# Patient Record
Sex: Female | Born: 1935
Health system: Southern US, Community
[De-identification: ages and names within clinical notes are randomized; demographics above are authoritative.]

## PROBLEM LIST (undated history)

## (undated) DIAGNOSIS — I10 Essential (primary) hypertension: Secondary | ICD-10-CM

## (undated) DIAGNOSIS — S5291XA Unspecified fracture of right forearm, initial encounter for closed fracture: Secondary | ICD-10-CM

## (undated) DIAGNOSIS — G309 Alzheimer's disease, unspecified: Secondary | ICD-10-CM

## (undated) DIAGNOSIS — F419 Anxiety disorder, unspecified: Secondary | ICD-10-CM

## (undated) DIAGNOSIS — E785 Hyperlipidemia, unspecified: Secondary | ICD-10-CM

## (undated) DIAGNOSIS — K219 Gastro-esophageal reflux disease without esophagitis: Secondary | ICD-10-CM

## (undated) DIAGNOSIS — F0280 Dementia in other diseases classified elsewhere without behavioral disturbance: Secondary | ICD-10-CM

## (undated) DIAGNOSIS — M8580 Other specified disorders of bone density and structure, unspecified site: Secondary | ICD-10-CM

## (undated) DIAGNOSIS — E079 Disorder of thyroid, unspecified: Secondary | ICD-10-CM

## (undated) DIAGNOSIS — Z86010 Personal history of colonic polyps: Secondary | ICD-10-CM

## (undated) DIAGNOSIS — E039 Hypothyroidism, unspecified: Secondary | ICD-10-CM

## (undated) DIAGNOSIS — S060X0A Concussion without loss of consciousness, initial encounter: Secondary | ICD-10-CM

## (undated) DIAGNOSIS — F0390 Unspecified dementia without behavioral disturbance: Secondary | ICD-10-CM

## (undated) DIAGNOSIS — Z9289 Personal history of other medical treatment: Secondary | ICD-10-CM

## (undated) HISTORY — DX: Other specified disorders of bone density and structure, unspecified site: M85.80

## (undated) HISTORY — PX: ROTATOR CUFF REPAIR: SHX139

## (undated) HISTORY — PX: BILATERAL SALPINGOOPHORECTOMY: SHX1223

## (undated) HISTORY — PX: TONSILLECTOMY: SUR1361

## (undated) HISTORY — DX: Hyperlipidemia, unspecified: E78.5

## (undated) HISTORY — DX: Personal history of other medical treatment: Z92.89

## (undated) HISTORY — PX: CATARACT EXTRACTION: SUR2

## (undated) HISTORY — DX: Concussion without loss of consciousness, initial encounter: S06.0X0A

## (undated) HISTORY — DX: Gastro-esophageal reflux disease without esophagitis: K21.9

## (undated) HISTORY — PX: OTHER SURGICAL HISTORY: SHX169

## (undated) HISTORY — DX: Personal history of colonic polyps: Z86.010

## (undated) HISTORY — DX: Unspecified fracture of right forearm, initial encounter for closed fracture: S52.91XA

## (undated) HISTORY — DX: Essential (primary) hypertension: I10

---

## 1998-02-23 ENCOUNTER — Other Ambulatory Visit: Admission: RE | Admit: 1998-02-23 | Discharge: 1998-02-23 | Payer: Self-pay | Admitting: Obstetrics and Gynecology

## 1999-02-07 ENCOUNTER — Emergency Department (HOSPITAL_COMMUNITY): Admission: EM | Admit: 1999-02-07 | Discharge: 1999-02-07 | Payer: Self-pay | Admitting: Emergency Medicine

## 1999-08-09 ENCOUNTER — Encounter: Payer: Self-pay | Admitting: Internal Medicine

## 1999-08-09 ENCOUNTER — Ambulatory Visit (HOSPITAL_COMMUNITY): Admission: RE | Admit: 1999-08-09 | Discharge: 1999-08-09 | Payer: Self-pay | Admitting: Internal Medicine

## 1999-10-04 ENCOUNTER — Encounter: Payer: Self-pay | Admitting: Internal Medicine

## 1999-10-04 ENCOUNTER — Encounter: Admission: RE | Admit: 1999-10-04 | Discharge: 1999-10-04 | Payer: Self-pay | Admitting: Internal Medicine

## 1999-11-15 ENCOUNTER — Other Ambulatory Visit: Admission: RE | Admit: 1999-11-15 | Discharge: 1999-11-15 | Payer: Self-pay | Admitting: *Deleted

## 1999-11-17 ENCOUNTER — Encounter: Payer: Self-pay | Admitting: *Deleted

## 1999-11-17 ENCOUNTER — Encounter: Admission: RE | Admit: 1999-11-17 | Discharge: 1999-11-17 | Payer: Self-pay | Admitting: *Deleted

## 2000-11-15 ENCOUNTER — Encounter: Payer: Self-pay | Admitting: Internal Medicine

## 2000-11-15 ENCOUNTER — Encounter: Admission: RE | Admit: 2000-11-15 | Discharge: 2000-11-15 | Payer: Self-pay | Admitting: Internal Medicine

## 2001-03-20 ENCOUNTER — Emergency Department (HOSPITAL_COMMUNITY): Admission: EM | Admit: 2001-03-20 | Discharge: 2001-03-20 | Payer: Self-pay | Admitting: Emergency Medicine

## 2001-03-20 ENCOUNTER — Encounter: Payer: Self-pay | Admitting: Emergency Medicine

## 2001-03-21 ENCOUNTER — Encounter: Payer: Self-pay | Admitting: Emergency Medicine

## 2001-03-28 ENCOUNTER — Emergency Department (HOSPITAL_COMMUNITY): Admission: EM | Admit: 2001-03-28 | Discharge: 2001-03-28 | Payer: Self-pay | Admitting: *Deleted

## 2001-05-11 ENCOUNTER — Encounter: Admission: RE | Admit: 2001-05-11 | Discharge: 2001-05-11 | Payer: Self-pay | Admitting: Orthopedic Surgery

## 2001-05-11 ENCOUNTER — Encounter: Payer: Self-pay | Admitting: Orthopedic Surgery

## 2001-10-25 ENCOUNTER — Ambulatory Visit (HOSPITAL_COMMUNITY): Admission: RE | Admit: 2001-10-25 | Discharge: 2001-10-26 | Payer: Self-pay | Admitting: *Deleted

## 2001-10-25 ENCOUNTER — Encounter: Payer: Self-pay | Admitting: *Deleted

## 2002-07-30 ENCOUNTER — Encounter: Admission: RE | Admit: 2002-07-30 | Discharge: 2002-07-30 | Payer: Self-pay | Admitting: Anesthesiology

## 2002-07-30 ENCOUNTER — Encounter: Payer: Self-pay | Admitting: Anesthesiology

## 2003-05-13 ENCOUNTER — Other Ambulatory Visit: Admission: RE | Admit: 2003-05-13 | Discharge: 2003-05-13 | Payer: Self-pay | Admitting: Internal Medicine

## 2003-11-07 ENCOUNTER — Ambulatory Visit (HOSPITAL_COMMUNITY): Admission: RE | Admit: 2003-11-07 | Discharge: 2003-11-07 | Payer: Self-pay | Admitting: Cardiology

## 2004-06-15 ENCOUNTER — Ambulatory Visit: Payer: Self-pay | Admitting: Internal Medicine

## 2004-07-01 ENCOUNTER — Encounter: Admission: RE | Admit: 2004-07-01 | Discharge: 2004-07-01 | Payer: Self-pay | Admitting: Internal Medicine

## 2004-08-06 ENCOUNTER — Ambulatory Visit: Payer: Self-pay | Admitting: Internal Medicine

## 2004-08-13 ENCOUNTER — Ambulatory Visit: Payer: Self-pay | Admitting: Internal Medicine

## 2004-08-20 ENCOUNTER — Ambulatory Visit: Payer: Self-pay | Admitting: Gastroenterology

## 2004-09-01 ENCOUNTER — Ambulatory Visit: Payer: Self-pay | Admitting: Gastroenterology

## 2004-11-11 ENCOUNTER — Ambulatory Visit: Payer: Self-pay | Admitting: Internal Medicine

## 2004-12-20 ENCOUNTER — Ambulatory Visit: Payer: Self-pay | Admitting: Gastroenterology

## 2004-12-24 ENCOUNTER — Ambulatory Visit: Payer: Self-pay | Admitting: Gastroenterology

## 2004-12-28 ENCOUNTER — Ambulatory Visit: Payer: Self-pay | Admitting: Internal Medicine

## 2005-01-17 ENCOUNTER — Ambulatory Visit: Payer: Self-pay | Admitting: Gastroenterology

## 2005-02-11 ENCOUNTER — Ambulatory Visit: Payer: Self-pay | Admitting: Internal Medicine

## 2005-04-01 ENCOUNTER — Ambulatory Visit: Payer: Self-pay | Admitting: Internal Medicine

## 2005-05-31 ENCOUNTER — Ambulatory Visit: Payer: Self-pay | Admitting: Internal Medicine

## 2005-07-28 ENCOUNTER — Ambulatory Visit: Payer: Self-pay | Admitting: Internal Medicine

## 2005-10-28 ENCOUNTER — Ambulatory Visit: Payer: Self-pay | Admitting: Internal Medicine

## 2005-12-29 ENCOUNTER — Ambulatory Visit: Payer: Self-pay | Admitting: Internal Medicine

## 2006-01-18 ENCOUNTER — Ambulatory Visit: Payer: Self-pay | Admitting: Internal Medicine

## 2006-02-08 ENCOUNTER — Ambulatory Visit: Payer: Self-pay | Admitting: Internal Medicine

## 2006-02-22 ENCOUNTER — Ambulatory Visit: Payer: Self-pay | Admitting: Internal Medicine

## 2006-04-24 ENCOUNTER — Ambulatory Visit: Payer: Self-pay | Admitting: Internal Medicine

## 2006-07-07 ENCOUNTER — Ambulatory Visit: Payer: Self-pay | Admitting: Internal Medicine

## 2006-08-03 ENCOUNTER — Ambulatory Visit: Payer: Self-pay | Admitting: Internal Medicine

## 2006-10-09 ENCOUNTER — Encounter: Admission: RE | Admit: 2006-10-09 | Discharge: 2006-10-09 | Payer: Self-pay | Admitting: Internal Medicine

## 2006-10-26 ENCOUNTER — Ambulatory Visit (HOSPITAL_BASED_OUTPATIENT_CLINIC_OR_DEPARTMENT_OTHER): Admission: RE | Admit: 2006-10-26 | Discharge: 2006-10-26 | Payer: Self-pay | Admitting: Orthopedic Surgery

## 2006-12-11 DIAGNOSIS — Z860100 Personal history of colon polyps, unspecified: Secondary | ICD-10-CM | POA: Insufficient documentation

## 2006-12-11 DIAGNOSIS — K219 Gastro-esophageal reflux disease without esophagitis: Secondary | ICD-10-CM | POA: Insufficient documentation

## 2006-12-11 DIAGNOSIS — I10 Essential (primary) hypertension: Secondary | ICD-10-CM

## 2006-12-11 DIAGNOSIS — M8580 Other specified disorders of bone density and structure, unspecified site: Secondary | ICD-10-CM

## 2006-12-25 ENCOUNTER — Ambulatory Visit: Payer: Self-pay | Admitting: Internal Medicine

## 2006-12-25 LAB — CONVERTED CEMR LAB
Alkaline Phosphatase: 48 units/L (ref 39–117)
BUN: 20 mg/dL (ref 6–23)
Basophils Relative: 0.6 % (ref 0.0–1.0)
Bilirubin Urine: NEGATIVE
Bilirubin, Direct: 0.1 mg/dL (ref 0.0–0.3)
CO2: 33 meq/L — ABNORMAL HIGH (ref 19–32)
Cholesterol: 191 mg/dL (ref 0–200)
Creatinine, Ser: 1.2 mg/dL (ref 0.4–1.2)
GFR calc Af Amer: 57 mL/min
Glucose, Bld: 97 mg/dL (ref 70–99)
HCT: 41.4 % (ref 36.0–46.0)
HDL: 53.1 mg/dL (ref >39.0)
Hemoglobin: 14.1 g/dL (ref 12.0–15.0)
Ketones, urine, test strip: NEGATIVE
Lymphocytes Relative: 37.7 % (ref 12.0–46.0)
Monocytes Absolute: 0.5 10*3/uL (ref 0.2–0.7)
Monocytes Relative: 10.8 % (ref 3.0–11.0)
Neutro Abs: 2.3 10*3/uL (ref 1.4–7.7)
Neutrophils Relative %: 48.4 % (ref 43.0–77.0)
Nitrite: NEGATIVE
Potassium: 4.4 meq/L (ref 3.5–5.1)
RDW: 13.2 % (ref 11.5–14.6)
Sodium: 144 meq/L (ref 135–145)
Specific Gravity, Urine: 1.015
TSH: 3.05 microintl units/mL (ref 0.35–5.50)
Total Bilirubin: 0.8 mg/dL (ref 0.3–1.2)
Total Protein: 5.9 g/dL — ABNORMAL LOW (ref 6.0–8.3)
VLDL: 12 mg/dL (ref 0–40)

## 2007-01-01 ENCOUNTER — Ambulatory Visit: Payer: Self-pay | Admitting: Internal Medicine

## 2007-04-18 ENCOUNTER — Ambulatory Visit: Payer: Self-pay | Admitting: Internal Medicine

## 2007-04-18 DIAGNOSIS — E785 Hyperlipidemia, unspecified: Secondary | ICD-10-CM | POA: Insufficient documentation

## 2007-04-18 LAB — CONVERTED CEMR LAB
Cholesterol, target level: 200 mg/dL
LDL Goal: 130 mg/dL

## 2007-05-31 HISTORY — PX: ABDOMINAL HYSTERECTOMY: SHX81

## 2007-07-11 ENCOUNTER — Encounter: Admission: RE | Admit: 2007-07-11 | Discharge: 2007-07-11 | Payer: Self-pay | Admitting: Internal Medicine

## 2007-07-19 ENCOUNTER — Encounter: Admission: RE | Admit: 2007-07-19 | Discharge: 2007-07-19 | Payer: Self-pay | Admitting: Internal Medicine

## 2007-08-21 ENCOUNTER — Ambulatory Visit: Payer: Self-pay | Admitting: Internal Medicine

## 2007-11-20 ENCOUNTER — Ambulatory Visit: Payer: Self-pay | Admitting: Internal Medicine

## 2007-11-20 DIAGNOSIS — T887XXA Unspecified adverse effect of drug or medicament, initial encounter: Secondary | ICD-10-CM

## 2007-11-20 DIAGNOSIS — R5381 Other malaise: Secondary | ICD-10-CM

## 2007-11-20 DIAGNOSIS — R5383 Other fatigue: Secondary | ICD-10-CM

## 2007-11-20 LAB — CONVERTED CEMR LAB
ALT: 19 units/L (ref 0–35)
AST: 29 units/L (ref 0–37)
Bilirubin, Direct: 0.1 mg/dL (ref 0.0–0.3)
Direct LDL: 133.8 mg/dL
Total Bilirubin: 0.7 mg/dL (ref 0.3–1.2)
Total CHOL/HDL Ratio: 3.8
Triglycerides: 81 mg/dL (ref 0–149)

## 2007-11-27 ENCOUNTER — Ambulatory Visit: Payer: Self-pay | Admitting: Internal Medicine

## 2007-12-06 ENCOUNTER — Telehealth: Payer: Self-pay | Admitting: Internal Medicine

## 2007-12-27 ENCOUNTER — Telehealth: Payer: Self-pay | Admitting: Internal Medicine

## 2008-02-11 ENCOUNTER — Telehealth: Payer: Self-pay | Admitting: Internal Medicine

## 2008-02-11 ENCOUNTER — Ambulatory Visit: Payer: Self-pay | Admitting: Internal Medicine

## 2008-02-11 DIAGNOSIS — R1013 Epigastric pain: Secondary | ICD-10-CM

## 2008-05-09 ENCOUNTER — Ambulatory Visit: Payer: Self-pay | Admitting: Internal Medicine

## 2008-05-09 LAB — CONVERTED CEMR LAB
ALT: 18 units/L (ref 0–35)
Bilirubin, Direct: 0.2 mg/dL (ref 0.0–0.3)
Direct LDL: 118 mg/dL
HDL: 76.1 mg/dL (ref >39.0)
TSH: 0.41 microintl units/mL (ref 0.35–5.50)
Total Bilirubin: 0.9 mg/dL (ref 0.3–1.2)
Total Protein: 6.1 g/dL (ref 6.0–8.3)
Triglycerides: 54 mg/dL (ref 0–149)
VLDL: 11 mg/dL (ref 0–40)

## 2008-05-14 ENCOUNTER — Ambulatory Visit: Payer: Self-pay | Admitting: Internal Medicine

## 2008-05-14 DIAGNOSIS — S060X0A Concussion without loss of consciousness, initial encounter: Secondary | ICD-10-CM

## 2008-05-14 HISTORY — DX: Concussion without loss of consciousness, initial encounter: S06.0X0A

## 2008-08-19 ENCOUNTER — Ambulatory Visit: Payer: Self-pay | Admitting: Internal Medicine

## 2008-08-19 LAB — CONVERTED CEMR LAB
BUN: 21 mg/dL (ref 6–23)
Calcium: 9.2 mg/dL (ref 8.4–10.5)
GFR calc non Af Amer: 58 mL/min (ref >60)
Glucose, Bld: 93 mg/dL (ref 70–99)
Potassium: 4.2 meq/L (ref 3.5–5.1)
Sodium: 139 meq/L (ref 135–145)
TSH: 0.94 microintl units/mL (ref 0.35–5.50)

## 2008-09-10 ENCOUNTER — Telehealth: Payer: Self-pay | Admitting: Internal Medicine

## 2008-12-09 ENCOUNTER — Ambulatory Visit: Payer: Self-pay | Admitting: Internal Medicine

## 2008-12-09 DIAGNOSIS — R002 Palpitations: Secondary | ICD-10-CM | POA: Insufficient documentation

## 2008-12-22 ENCOUNTER — Ambulatory Visit: Payer: Self-pay

## 2008-12-22 ENCOUNTER — Encounter: Payer: Self-pay | Admitting: Internal Medicine

## 2009-02-10 ENCOUNTER — Telehealth: Payer: Self-pay | Admitting: Internal Medicine

## 2009-03-16 ENCOUNTER — Ambulatory Visit: Payer: Self-pay | Admitting: Internal Medicine

## 2009-03-16 DIAGNOSIS — L723 Sebaceous cyst: Secondary | ICD-10-CM

## 2009-04-22 ENCOUNTER — Ambulatory Visit: Payer: Self-pay | Admitting: Internal Medicine

## 2009-05-30 DIAGNOSIS — S5291XA Unspecified fracture of right forearm, initial encounter for closed fracture: Secondary | ICD-10-CM

## 2009-05-30 HISTORY — DX: Unspecified fracture of right forearm, initial encounter for closed fracture: S52.91XA

## 2009-06-04 ENCOUNTER — Encounter: Payer: Self-pay | Admitting: Internal Medicine

## 2009-06-11 ENCOUNTER — Telehealth: Payer: Self-pay | Admitting: Internal Medicine

## 2009-10-30 ENCOUNTER — Encounter: Admission: RE | Admit: 2009-10-30 | Discharge: 2009-10-30 | Payer: Self-pay | Admitting: Internal Medicine

## 2009-11-03 ENCOUNTER — Ambulatory Visit: Payer: Self-pay | Admitting: Internal Medicine

## 2009-11-03 DIAGNOSIS — J069 Acute upper respiratory infection, unspecified: Secondary | ICD-10-CM | POA: Insufficient documentation

## 2009-11-11 ENCOUNTER — Telehealth: Payer: Self-pay | Admitting: Internal Medicine

## 2010-03-02 ENCOUNTER — Ambulatory Visit: Payer: Self-pay | Admitting: Internal Medicine

## 2010-03-02 LAB — CONVERTED CEMR LAB
AST: 30 units/L (ref 0–37)
Albumin: 3.9 g/dL (ref 3.5–5.2)
BUN: 19 mg/dL (ref 6–23)
Basophils Absolute: 0 10*3/uL (ref 0.0–0.1)
CO2: 32 meq/L (ref 19–32)
Chloride: 102 meq/L (ref 96–112)
Direct LDL: 149.4 mg/dL
Eosinophils Absolute: 0.1 10*3/uL (ref 0.0–0.7)
Glucose, Bld: 93 mg/dL (ref 70–99)
Glucose, Urine, Semiquant: NEGATIVE
HCT: 42.1 % (ref 36.0–46.0)
Hemoglobin: 14 g/dL (ref 12.0–15.0)
Lymphs Abs: 1.6 10*3/uL (ref 0.7–4.0)
MCHC: 33.2 g/dL (ref 30.0–36.0)
MCV: 90.2 fL (ref 78.0–100.0)
Monocytes Absolute: 0.4 10*3/uL (ref 0.1–1.0)
Monocytes Relative: 8.4 % (ref 3.0–12.0)
Neutro Abs: 2.2 10*3/uL (ref 1.4–7.7)
Nitrite: NEGATIVE
Potassium: 4.3 meq/L (ref 3.5–5.1)
RDW: 14.2 % (ref 11.5–14.6)
Sodium: 140 meq/L (ref 135–145)
Specific Gravity, Urine: 1.015
TSH: 1.33 microintl units/mL (ref 0.35–5.50)
WBC Urine, dipstick: NEGATIVE
pH: 8.5

## 2010-03-09 ENCOUNTER — Ambulatory Visit: Payer: Self-pay | Admitting: Internal Medicine

## 2010-06-20 ENCOUNTER — Encounter: Payer: Self-pay | Admitting: Internal Medicine

## 2010-06-29 NOTE — Letter (Signed)
Summary: [facility]  [facility]   Imported By: Maryln Gottron 06/10/2009 14:04:38  _____________________________________________________________________  External Attachment:    Type:   Image     Comment:   External Document

## 2010-06-29 NOTE — Progress Notes (Signed)
Summary: Pt still has unproductive cough. Req med to be called in.  Phone Note Call from Patient Call back at [facility] Phone [phone]   Caller: Patient Summary of Call: Pt is still coughing, especially at night. Cough isnt productive. She just was in for ov last week. Pt says she just can't get any relief. Is there a med that could be called in to [facility] [address]   Initial call taken by: Lucy Antigua,  November 11, 2009 8:10 AM  Follow-up for Phone Call        generic Hydromet, 6-ounce, 1 teaspoon every 6 hours as needed for cough Follow-up by: Gordy Savers  MD,  November 12, 2009 7:55 AM    New/Updated Medications: HYDROMET 5-1.5 MG/5ML SYRP (HYDROCODONE-HOMATROPINE) one teaspoons q 6 hours as needed cough. Prescriptions: HYDROMET 5-1.5 MG/5ML SYRP (HYDROCODONE-HOMATROPINE) one teaspoons q 6 hours as needed cough.  #6 x 0   Entered by:   Lynann Beaver CMA   Authorized by:   Gordy Savers  MD   Signed by:   Lynann Beaver CMA on 11/12/2009   Method used:   Telephoned to ...       [facility]  [address] [phone]* (retail)       [address]       [place], [place]  [address]       Ph: [phone] or [phone]       Fax: [phone]   RxID:   [id number]  Pt. notified.

## 2010-06-29 NOTE — Assessment & Plan Note (Signed)
Summary: cough at night/njr   Vital Signs:  Patient profile:   76 year old female Weight:      121 pounds Temp:     97.5 degrees F oral BP sitting:   110 / 74  (right arm) Cuff size:   regular  Vitals Entered By: Duard Brady LPN (November 03, 2593 1:41 PM) CC: c/o cough - waking and nonproductive , no fever or congestion Is Patient Diabetic? No   CC:  c/o cough - waking and nonproductive  and no fever or congestion.  History of Present Illness: 76 year old patient who is seen today for follow-up.  She has a history of mild dyslipidemia osteopenia and hypertension, presently controlled without medications.  For the past 7 days.  She has had mild nonproductive cough that is worse at night.  There is been no fever, sputum production, congestion, chest pain or shortness of breath.  Her husband had a similar illness that has resolved.  Denies any chills.  She has been taking Robitussin with some benefit.  She has treated hypothyroidism  Preventive Screening-Counseling & Management  Alcohol-Tobacco     Smoking Status: never  Allergies: 1)  ! Sulfa 2)  ! Entex  Past History:  Past Medical History: Reviewed history from 04/18/2007 and no changes required. GERD Hypertension Osteopenia Colonic polyps, hx of fracture to left wrist Hyperlipidemia  Past Surgical History: Reviewed history from 01/01/2007 and no changes required. Rotator cuff repair Badder Tack Tonsillectomy BSO TVH surgical repair to fx left wrist  Review of Systems       The patient complains of prolonged cough.  The patient denies anorexia, fever, weight loss, weight gain, vision loss, decreased hearing, hoarseness, chest pain, syncope, dyspnea on exertion, peripheral edema, headaches, hemoptysis, abdominal pain, melena, hematochezia, severe indigestion/heartburn, hematuria, incontinence, genital sores, muscle weakness, suspicious skin lesions, transient blindness, difficulty walking, depression, unusual  weight change, abnormal bleeding, enlarged lymph nodes, angioedema, and breast masses.    Physical Exam  General:  Well-developed,well-nourished,in no acute distress; alert,appropriate and cooperative throughout examination Head:  Normocephalic and atraumatic without obvious abnormalities. No apparent alopecia or balding. Eyes:  No corneal or conjunctival inflammation noted. EOMI. Perrla. Funduscopic exam benign, without hemorrhages, exudates or papilledema. Vision grossly normal. Ears:  External ear exam shows no significant lesions or deformities.  Otoscopic examination reveals clear canals, tympanic membranes are intact bilaterally without bulging, retraction, inflammation or discharge. Hearing is grossly normal bilaterally. Mouth:  Oral mucosa and oropharynx without lesions or exudates.    Neck:  No deformities, masses, or tenderness noted. Lungs:  Normal respiratory effort, chest expands symmetrically. Lungs are clear to auscultation, no crackles or wheezes. Heart:  Normal rate and regular rhythm. S1 and S2 normal without gallop, murmur, click, rub or other extra sounds. Abdomen:  Bowel sounds positive,abdomen soft and non-tender without masses, organomegaly or hernias noted. Msk:  No deformity or scoliosis noted of thoracic or lumbar spine.   Extremities:  No clubbing, cyanosis, edema, or deformity noted with normal full range of motion of all joints.     Impression & Recommendations:  Problem # 1:  URI (ICD-465.9)  Her updated medication list for this problem includes:    Adult Aspirin Ec Low Strength 81 Mg Tbec (Aspirin) .Marland Kitchen... Take one tablet  by mouth  Problem # 2:  HYPERLIPIDEMIA (ICD-272.4)  Problem # 3:  HYPERTENSION (ICD-401.9)  Complete Medication List: 1)  Imipramine Hcl 50 Mg Tabs (Imipramine hcl) .... One by mouth at lunch and two by  mouth in the evening 2)  Levothyroxine Sodium 88 Mcg Tabs (Levothyroxine sodium) .... One by mouth on an empty stomach 3)  Adult Aspirin  Ec Low Strength 81 Mg Tbec (Aspirin) .... Take one tablet  by mouth 4)  Alprazolam 0.25 Mg Tabs (Alprazolam) .... One by mouth three times a day prn 5)  Metamucil Plus Calcium Caps (Psyllium-calcium) .... Once daily  Patient Instructions: 1)  Get plenty of rest, drink lots of clear liquids, and use Tylenol or Ibuprofen for fever and comfort. Return in 7-10 days if you're not better:sooner if you're feeling worse. 2)  Take calcium +Vitamin D daily. 3)  It is important that you exercise regularly at least 20 minutes 5 times a week. If you develop chest pain, have severe difficulty breathing, or feel very tired , stop exercising immediately and seek medical attention. Prescriptions: ALPRAZOLAM 0.25 MG  TABS (ALPRAZOLAM) one by mouth three times a day prn  #60 x 3   Entered and Authorized by:   Gordy Savers  MD   Signed by:   Gordy Savers  MD on 11/03/2009   Method used:   Print then Give to Patient   RxID:   [id number] LEVOTHYROXINE SODIUM 88 MCG TABS (LEVOTHYROXINE SODIUM) one by mouth on an empty stomach  #90 x 9   Entered and Authorized by:   Gordy Savers  MD   Signed by:   Gordy Savers  MD on 11/03/2009   Method used:   Print then Give to Patient   RxID:   [id number] IMIPRAMINE HCL 50 MG TABS (IMIPRAMINE HCL) one by mouth at lunch and two by mouth in the evening  #270 x 3   Entered and Authorized by:   Gordy Savers  MD   Signed by:   Gordy Savers  MD on 11/03/2009   Method used:   Print then Give to Patient   RxID:   [id number]

## 2010-06-29 NOTE — Progress Notes (Signed)
Summary: fall on ice  Phone Note Call from Patient   Caller: Patient Call For: Stacie Glaze MD Reason for Call: Acute Illness Summary of Call: Pt fell on ice this am and thinks she has fractured her wrist.  Wants to go to [facility] since she has had surgery there before. Advised that would be ok. Initial call taken by: Lynann Beaver CMA,  June 11, 2009 1:12 PM  Follow-up for Phone Call        talked with pt and she goes to dr Eulah Pont- called and got ov this afternnon at 2:45 with dr Janeice Robinson aware Follow-up by: Willy Eddy, LPN,  June 11, 2009 1:25 PM

## 2010-06-29 NOTE — Assessment & Plan Note (Signed)
Summary: cpx//ccm   Vital Signs:  Patient profile:   76 year old female Weight:      122 pounds BMI:     21.02 Temp:     98.2 degrees F oral Pulse rate:   72 / minute Resp:     14 per minute BP sitting:   144 / 80  (left arm)  Vitals Entered By: Willy Eddy, LPN (March 09, 2010 2:32 PM) CC: annual visit for disease managment--rt arm fx in jan- had bone density long ago, Hypertension Management, Lipid Management Is Patient Diabetic? No   Primary Care Provider:  Stacie Glaze MD  CC:  annual visit for disease managment--rt arm fx in jan- had bone density long ago, Hypertension Management, and Lipid Management.  History of Present Illness: The pt present for a CPX The pt was asked about all immunizations, health maint. services that are appropriate to their age and was given guidance on diet exercize  and weight management  The pt has elevated cholesterol and has been taking metamucil intermitantly    Hypertension History:      She denies headache, chest pain, palpitations, dyspnea with exertion, orthopnea, PND, peripheral edema, visual symptoms, neurologic problems, syncope, and side effects from treatment.        Positive major cardiovascular risk factors include female age 11 years old or older, hyperlipidemia, and hypertension.  Negative major cardiovascular risk factors include no history of diabetes, negative family history for ischemic heart disease, and non-tobacco-user status.        Further assessment for target organ damage reveals no history of ASHD, stroke/TIA, or peripheral vascular disease.    Lipid Management History:      Positive NCEP/ATP III risk factors include female age 72 years old or older and hypertension.  Negative NCEP/ATP III risk factors include no history of early menopause without estrogen hormone replacement, non-diabetic, HDL cholesterol greater than 60, no family history for ischemic heart disease, non-tobacco-user status, no ASHD  (atherosclerotic heart disease), no prior stroke/TIA, no peripheral vascular disease, and no history of aortic aneurysm.      Preventive Screening-Counseling & Management  Alcohol-Tobacco     Smoking Status: never     Passive Smoke Exposure: no     Tobacco Counseling: not indicated; no tobacco use  Problems Prior to Update: 1)  Uri  (ICD-465.9) 2)  Sebaceous Cyst, Infected  (ICD-706.2) 3)  Hyperlipidemia  (ICD-272.4) 4)  Palpitations, Recurrent  (ICD-785.1) 5)  Concussion With No Loss of Consciousness  (ICD-850.0) 6)  Abdominal Pain, Epigastric  (ICD-789.06) 7)  Uns Advrs Eff Uns Rx Medicinal&biological Sbstnc  (ICD-995.20) 8)  Other Malaise and Fatigue  (ICD-780.79) 9)  Hyperlipidemia  (ICD-272.4) 10)  Preventive Health Care  (ICD-V70.0) 11)  Colonic Polyps, Hx of  (ICD-V12.72) 12)  Osteopenia  (ICD-733.90) 13)  Hypertension  (ICD-401.9) 14)  Gerd  (ICD-530.81)  Current Problems (verified): 1)  Uri  (ICD-465.9) 2)  Sebaceous Cyst, Infected  (ICD-706.2) 3)  Hyperlipidemia  (ICD-272.4) 4)  Palpitations, Recurrent  (ICD-785.1) 5)  Concussion With No Loss of Consciousness  (ICD-850.0) 6)  Abdominal Pain, Epigastric  (ICD-789.06) 7)  Uns Advrs Eff Uns Rx Medicinal&biological Sbstnc  (ICD-995.20) 8)  Other Malaise and Fatigue  (ICD-780.79) 9)  Hyperlipidemia  (ICD-272.4) 10)  Preventive Health Care  (ICD-V70.0) 11)  Colonic Polyps, Hx of  (ICD-V12.72) 12)  Osteopenia  (ICD-733.90) 13)  Hypertension  (ICD-401.9) 14)  Gerd  (ICD-530.81)  Medications Prior to Update: 1)  Imipramine Hcl  50 Mg Tabs (Imipramine Hcl) .... One By Mouth At Lunch and Two By Mouth in The Evening 2)  Levothyroxine Sodium 88 Mcg Tabs (Levothyroxine Sodium) .... One By Mouth On An Empty Stomach 3)  Adult Aspirin Ec Low Strength 81 Mg  Tbec (Aspirin) .... Take One Tablet  By Mouth 4)  Alprazolam 0.25 Mg  Tabs (Alprazolam) .... One By Mouth Three Times A Day Prn 5)  Metamucil Plus Calcium  Caps  (Psyllium-Calcium) .... Once Daily 6)  Hydromet 5-1.5 Mg/23ml Syrp (Hydrocodone-Homatropine) .... One Teaspoons Q 6 Hours As Needed Cough.  Current Medications (verified): 1)  Imipramine Hcl 50 Mg Tabs (Imipramine Hcl) .... One By Mouth At Lunch and Two By Mouth in The Evening 2)  Levothyroxine Sodium 88 Mcg Tabs (Levothyroxine Sodium) .... One By Mouth On An Empty Stomach 3)  Adult Aspirin Ec Low Strength 81 Mg  Tbec (Aspirin) .... Take One Tablet  By Mouth 4)  Alprazolam 0.25 Mg  Tabs (Alprazolam) .... One By Mouth Three Times A Day Prn 5)  Metamucil Plus Calcium  Caps (Psyllium-Calcium) .... Once Daily 6)  Krill Oil 1000 Mg Caps (Krill Oil) .... Two By Mouth Two Times A Day  Allergies (verified): 1)  ! Sulfa 2)  ! Entex  Past History:  Family History: Last updated: 01/01/2007 Family History Lung cancer  Social History: Last updated: 01/01/2007 Retired Married Never Smoked  Risk Factors: Smoking Status: never (03/09/2010) Passive Smoke Exposure: no (03/09/2010)  Past medical, surgical, family and social histories (including risk factors) reviewed, and no changes noted (except as noted below).  Past Medical History: GERD Hypertension Osteopenia Colonic polyps, hx of fracture to left wrist Hyperlipidemia fracture to right forearm 2011  Past Surgical History: Rotator cuff repair Badder Tack Tonsillectomy BSO TVH surgical repair to fx left wrist fx right forearm  Family History: Reviewed history from 01/01/2007 and no changes required. Family History Lung cancer  Social History: Reviewed history from 01/01/2007 and no changes required. Retired Married Never Smoked  Review of Systems  The patient denies anorexia, fever, weight loss, weight gain, vision loss, decreased hearing, hoarseness, chest pain, syncope, dyspnea on exertion, peripheral edema, prolonged cough, headaches, hemoptysis, abdominal pain, melena, hematochezia, severe indigestion/heartburn,  hematuria, incontinence, genital sores, muscle weakness, suspicious skin lesions, transient blindness, difficulty walking, depression, unusual weight change, abnormal bleeding, enlarged lymph nodes, angioedema, and breast masses.         Flu Vaccine Consent Questions     Do you have a history of severe allergic reactions to this vaccine? no    Any prior history of allergic reactions to egg and/or gelatin? no    Do you have a sensitivity to the preservative Thimersol? no    Do you have a past history of Guillan-Barre Syndrome? no    Do you currently have an acute febrile illness? no    Have you ever had a severe reaction to latex? no    Vaccine information given and explained to patient? yes    Are you currently pregnant? no    Lot Number:[id number]   Exp Date:11/27/2010   Site Given  Left Deltoid IM    Physical Exam  General:  Well-developed,well-nourished,in no acute distress; alert,appropriate and cooperative throughout examination Head:  Normocephalic and atraumatic without obvious abnormalities. No apparent alopecia or balding. Eyes:  pupils equal.   Ears:  R ear normal and L ear normal.   Nose:  no external deformity and no nasal discharge.   Neck:  No deformities,  masses, or tenderness noted. Lungs:  Normal respiratory effort, chest expands symmetrically. Lungs are clear to auscultation, no crackles or wheezes. Heart:  Normal rate and regular rhythm. S1 and S2 normal without gallop, murmur, click, rub or other extra sounds. Abdomen:  Bowel sounds positive,abdomen soft and non-tender without masses, organomegaly or hernias noted. Msk:  No deformity or scoliosis noted of thoracic or lumbar spine.   Extremities:  No clubbing, cyanosis, edema, or deformity noted with normal full range of motion of all joints.     Impression & Recommendations:  Problem # 1:  HYPERLIPIDEMIA (ICD-272.4)  Labs Reviewed: SGOT: 31 (05/09/2008)   SGPT: 18 (05/09/2008)  Lipid Goals: Chol Goal: 200  (04/18/2007)   HDL Goal: 40 (04/18/2007)   LDL Goal: 130 (04/18/2007)   TG Goal: 150 (04/18/2007)  Prior 10 Yr Risk Heart Disease: Not enough information (02/11/2008)   HDL:76.1 (05/09/2008), 58.6 (11/20/2007)  LDL:DEL (05/09/2008), DEL (11/20/2007)  Chol:217 (05/09/2008), 222 (11/20/2007)  Trig:54 (05/09/2008), 81 (11/20/2007)  Problem # 2:  PREVENTIVE HEALTH CARE (ICD-V70.0) The pt was asked about all immunizations, health maint. services that are appropriate to their age and was given guidance on diet exercize  and weight management  Mammogram: ASSESSMENT: Negative - BI-RADS 1^MM DIGITAL SCREENING (10/30/2009) Colonoscopy: normal (12/20/2004) Td Booster: Historical (03/20/2001)   Flu Vax: Fluvax 3+ (03/09/2010)   Pneumovax: Historical (05/30/2000) Chol: 217 (05/09/2008)   HDL: 76.1 (05/09/2008)   LDL: DEL (05/09/2008)   TG: 54 (05/09/2008) TSH: 0.94 (08/19/2008)   Next mammogram due:: 10/2010 (03/09/2010) Next Colonoscopy due:: 12/2011 (03/09/2010)  Discussed using sunscreen, use of alcohol, drug use, self breast exam, routine dental care, routine eye care, schedule for GYN exam, routine physical exam, seat belts, multiple vitamins, osteoporosis prevention, adequate calcium intake in diet, recommendations for immunizations, mammograms and Pap smears.  Discussed exercise and checking cholesterol.  Discussed gun safety, safe sex, and contraception.  Complete Medication List: 1)  Imipramine Hcl 50 Mg Tabs (Imipramine hcl) .... One by mouth at lunch and two by mouth in the evening 2)  Levothyroxine Sodium 88 Mcg Tabs (Levothyroxine sodium) .... One by mouth on an empty stomach 3)  Adult Aspirin Ec Low Strength 81 Mg Tbec (Aspirin) .... Take one tablet  by mouth 4)  Alprazolam 0.25 Mg Tabs (Alprazolam) .... One by mouth three times a day prn 5)  Metamucil Plus Calcium Caps (Psyllium-calcium) .... Once daily 6)  Krill Oil 1000 Mg Caps (Krill oil) .... Two by mouth two times a day  Other  Orders: Flu Vaccine 45yrs + MEDICARE PATIENTS (E4540) Administration Flu vaccine - MCR (J8119)  Hypertension Assessment/Plan:      The patient's hypertensive risk group is category B: At least one risk factor (excluding diabetes) with no target organ damage.  Today's blood pressure is 144/80.  Her blood pressure goal is < 140/90.  Lipid Assessment/Plan:      Based on NCEP/ATP III, the patient's risk factor category is "0-1 risk factors".  The patient's lipid goals are as follows: Total cholesterol goal is 200; LDL cholesterol goal is 130; HDL cholesterol goal is 40; Triglyceride goal is 150.  Her LDL cholesterol goal has been met.    Patient Instructions: 1)  two kril oil capsules twice a day ( for cholesterol and  your brain) 2)  one 200 motrin a day for your brain ( hold off the aspirin) 3)  exercize every day ( 20 min walk or ride) 4)  Please schedule a follow-up appointment in 4  months. 5)  Hepatic Panel prior to visit, ICD-9:995.20 6)  Lipid Panel prior to visit, ICD-9:272.4   Preventive Care Screening  Colonoscopy:    Date:  12/20/2004    Next Due:  12/2011    Results:  normal   Mammogram:    Date:  05/29/2009    Next Due:  10/2010    Results:  normal   Last Flu Shot:    Date:  03/09/2010    Results:  Fluvax 3+   Contraindications/Deferment of Procedures/Staging:    Test/Procedure: PAP Smear    Reason for deferment: hysterectomy

## 2010-07-05 ENCOUNTER — Other Ambulatory Visit: Payer: Self-pay

## 2010-07-12 ENCOUNTER — Other Ambulatory Visit: Payer: PRIVATE HEALTH INSURANCE | Admitting: Internal Medicine

## 2010-07-12 DIAGNOSIS — E785 Hyperlipidemia, unspecified: Secondary | ICD-10-CM

## 2010-07-12 DIAGNOSIS — T887XXA Unspecified adverse effect of drug or medicament, initial encounter: Secondary | ICD-10-CM

## 2010-07-12 LAB — HEPATIC FUNCTION PANEL
Albumin: 4.1 g/dL (ref 3.5–5.2)
Bilirubin, Direct: 0.1 mg/dL (ref 0.0–0.3)
Total Protein: 6.3 g/dL (ref 6.0–8.3)

## 2010-07-12 LAB — LIPID PANEL
HDL: 62.3 mg/dL (ref >39.00)
Triglycerides: 142 mg/dL (ref 0.0–149.0)

## 2010-07-12 LAB — LDL CHOLESTEROL, DIRECT: Direct LDL: 147.3 mg/dL

## 2010-07-19 ENCOUNTER — Ambulatory Visit (INDEPENDENT_AMBULATORY_CARE_PROVIDER_SITE_OTHER): Payer: PRIVATE HEALTH INSURANCE | Admitting: Internal Medicine

## 2010-07-19 ENCOUNTER — Encounter: Payer: Self-pay | Admitting: Internal Medicine

## 2010-07-19 VITALS — BP 136/80 | HR 80 | Temp 97.8°F | Resp 14 | Ht 64.0 in | Wt 120.0 lb

## 2010-07-19 DIAGNOSIS — Z2911 Encounter for prophylactic immunotherapy for respiratory syncytial virus (RSV): Secondary | ICD-10-CM

## 2010-07-19 DIAGNOSIS — I10 Essential (primary) hypertension: Secondary | ICD-10-CM

## 2010-07-19 DIAGNOSIS — R5383 Other fatigue: Secondary | ICD-10-CM

## 2010-07-19 DIAGNOSIS — R5381 Other malaise: Secondary | ICD-10-CM

## 2010-07-19 DIAGNOSIS — R51 Headache: Secondary | ICD-10-CM

## 2010-07-19 DIAGNOSIS — Z0000 Encounter for general adult medical examination without abnormal findings: Secondary | ICD-10-CM | POA: Insufficient documentation

## 2010-07-19 DIAGNOSIS — E785 Hyperlipidemia, unspecified: Secondary | ICD-10-CM

## 2010-07-19 MED ORDER — ROSUVASTATIN CALCIUM 20 MG PO TABS: 20.0000 mg | ORAL_TABLET | ORAL | Status: DC

## 2010-07-19 NOTE — Assessment & Plan Note (Signed)
The patient presents for followup of her cholesterol she is doing well with normal liver functions her total cholesterol is elevated at 242 and her LDL C. Or bad cholesterol was elevated at 147.3 when she was on medications this was as low as 118.   she would be an ideal candidate for pulse therapy with Crestor 20 mg one by mouth weekly she has tolerated this medication in the past in addition be a good trial for her we'll try this medication for 3 months repeat cholesterol in 3 months and see if this is ineffective in controlling her cholesterol without side effects

## 2010-07-19 NOTE — Progress Notes (Signed)
Subjective:    Patient ID: Julia [name], female    DOB: August 24, 1934, 76 y.o.   MRN: [id number]  HPI   patient is a 76 year old white female who presents for followup on hyperlipidemia and hypertension she had a lipid panel drawn prior to her visit today showing an increased total cholesterol and LDL cholesterol. She had tried to control her cholesterol through diet and exercise she did not want to start medication if at all possible however a trial of diet and exercise has not worked and her LDL cholesterol 147 which is too high for her risk factor profile.  We discussed the use of Crestor and pulse therapy and she'll begin 20 mg by mouth weekly with monitoring of her cholesterol in 3 months.   her blood pressure is well-controlled on  No medications at this time so dietary and exercise changes did allow her to get off her blood pressure medicines can remain off blood pressure medicines.  She is also seen for hypothyroidism or monitoring is up to date she requests a shingles vaccine.  Review of Systems  Constitutional: Negative for activity change, appetite change and fatigue.  HENT: Negative for ear pain, congestion, neck pain, postnasal drip and sinus pressure.   Eyes: Negative for redness and visual disturbance.  Respiratory: Negative for cough, shortness of breath and wheezing.   Gastrointestinal: Negative for abdominal pain and abdominal distention.  Genitourinary: Negative for dysuria, frequency and menstrual problem.  Musculoskeletal: Negative for myalgias, joint swelling and arthralgias.  Skin: Negative for rash and wound.  Neurological: Negative for dizziness, weakness and headaches.  Hematological: Negative for adenopathy. Does not bruise/bleed easily.  Psychiatric/Behavioral: Negative for sleep disturbance and decreased concentration.   Past Medical History  Diagnosis Date  . GERD (gastroesophageal reflux disease)   . Hypertension   . Hyperlipidemia   . Osteopenia   .  History of colonic polyps   . Right forearm fracture 2011   Past Surgical History  Procedure Date  . Rotator cuff repair   . Bladder tack   . Tonsillectomy   . Bilateral salpingoophorectomy   . Surgical repair to fx left wrist   . Abdominal hysterectomy     reports that she has never smoked. She does not have any smokeless tobacco history on file. She reports that she does not drink alcohol or use illicit drugs. family history includes Alzheimer's disease in her maternal aunt; COPD in her mother; Cancer in her father and maternal uncle; Drug abuse in her maternal uncle; Heart disease in her brother; Hypertension in her mother; and Lung cancer in an unspecified family member.        Objective:   Physical Exam  Constitutional: She is oriented to person, place, and time. She appears well-developed and well-nourished. No distress.  HENT:  Head: Normocephalic and atraumatic.  Right Ear: External ear normal.  Left Ear: External ear normal.  Nose: Nose normal.  Mouth/Throat: Oropharynx is clear and moist.  Eyes: Conjunctivae and EOM are normal. Pupils are equal, round, and reactive to light.  Neck: Normal range of motion. Neck supple. No JVD present. No tracheal deviation present. No thyromegaly present.  Cardiovascular: Normal rate, regular rhythm, normal heart sounds and intact distal pulses.   No murmur heard. Pulmonary/Chest: Effort normal and breath sounds normal. She has no wheezes. She exhibits no tenderness.  Abdominal: Soft. Bowel sounds are normal.  Musculoskeletal: Normal range of motion. She exhibits no edema and no tenderness.  Lymphadenopathy:    She has  no cervical adenopathy.  Neurological: She is alert and oriented to person, place, and time. She has normal reflexes. No cranial nerve deficit.  Skin: Skin is warm and dry. She is not diaphoretic.  Psychiatric: She has a normal mood and affect. Her behavior is normal.          Assessment & Plan:   for her  hyperlipidemia we will start her on Crestor 20 mg one by mouth weekly a lipid and liver will be checked in 3 months should she have any problems with this medication specifically muscle aches and pains she would discontinue the medication and notify her office.  Her blood pressure is well-controlled her current diet and exercise protocol she is urged to continue with her healthy eating habits.  She used to because of the zoster next a day and one will be administered to her.  Her thyroid is stable and she needs no current refills on any of her medications she'll be brought back in 3 months with a lipid and liver prior orders have been placed

## 2010-07-19 NOTE — Assessment & Plan Note (Signed)
Blood pressure remained in good control off all medications

## 2010-07-19 NOTE — Assessment & Plan Note (Signed)
improved

## 2010-09-16 ENCOUNTER — Other Ambulatory Visit: Payer: Self-pay | Admitting: Internal Medicine

## 2010-10-11 ENCOUNTER — Encounter (INDEPENDENT_AMBULATORY_CARE_PROVIDER_SITE_OTHER): Payer: PRIVATE HEALTH INSURANCE | Admitting: Internal Medicine

## 2010-10-11 DIAGNOSIS — E785 Hyperlipidemia, unspecified: Secondary | ICD-10-CM

## 2010-10-11 LAB — LIPID PANEL
HDL: 69.4 mg/dL (ref >39.00)
LDL Cholesterol: 100 mg/dL — ABNORMAL HIGH (ref 0–99)
Total CHOL/HDL Ratio: 3
Triglycerides: 78 mg/dL (ref 0.0–149.0)

## 2010-10-12 NOTE — Op Note (Signed)
Julia, [name]            ACCOUNT NO.:  [id number]   MEDICAL RECORD NO.:  [id number]          PATIENT TYPE:  AMB   LOCATION:  DSC                          FACILITY:  [facility]   PHYSICIAN:  Loreta Ave, M.D. DATE OF BIRTH:  Aug 09, 1934   DATE OF PROCEDURE:  10/26/2006  DATE OF DISCHARGE:                               OPERATIVE REPORT   PREOPERATIVE DIAGNOSIS:  Displaced four-part intra-articular left distal  radius fracture, closed.   POSTOPERATIVE DIAGNOSIS:  Displaced four-part intra-articular left  distal radius fracture, closed.   OPERATIVE PROCEDURE:  Open reduction and internal fixation, left distal  radius fracture, utilizing a Synthes volar plate and screws.   SURGEON:  Loreta Ave, M.D.   ASSISTANT:  Genene Churn. Denton Meek.   ANESTHESIA:  General.   BLOOD LOSS:  Minimal.   TOURNIQUET TIME:  45 minutes.   SPECIMENS:  None.   CULTURES:  None.   COMPLICATIONS:  None.   PROCEDURE:  Soft compressive with short-arm splint.   PROCEDURE:  Patient brought to the operating room and placed on  operating table in supine position.  After adequate anesthesia had been  obtained, I was able to reasonably align fragments with closed  reduction.  Tourniquet applied.  Prepped and draped in the usual sterile  fashion.  Exsanguinated with elevation and Esmarch, tourniquet inflated  to 250 mmHg.  Incision along the volar radial aspect of the wrist.  Skin  and subcutaneous tissue divided protecting neurovascular structures.  Pronator taken down off the margin of the radius.  Subperiosteal  exposure of the fracture.  Fracture fragments were elevated, reduced so  that I had a congruent articular surface on all views with fluoroscopic  guidance.  I pre-bent a volar plate.  This was then fixed with 3 screws  proximal, 3 distal.  I had to angle the plate somewhat on the shaft  proximally so that I got good bony fixation of all the fragments  distally.  I had good seating and  alignment of all screws, solid stable  fixation with essentially anatomic alignment including a congruous  articular surface confirmed visually and fluoroscopically.  Wound  irrigated.  Soft tissue closed with Vicryl and then skin with staples.  A sterile compressive dressing and then a short-arm splint applied.  Anesthesia reversed.  Brought to the recovery room.  Tolerated the  surgery well.      Loreta Ave, M.D.  Electronically Signed     DFM/MEDQ  D:  10/26/2006  T:  10/26/2006  Job:  [id number]

## 2010-10-15 NOTE — Cardiovascular Report (Signed)
Julia [name], Julia [name]                        ACCOUNT NO.:  [id number]   MEDICAL RECORD NO.:  [id number]                   PATIENT TYPE:  OIB   LOCATION:  2853                                 FACILITY:  [facility]   PHYSICIAN:  Charlies Constable, M.D. [facility]              DATE OF BIRTH:  Oct 11, 1934   DATE OF PROCEDURE:  11/07/2003  DATE OF DISCHARGE:  11/07/2003                              CARDIAC CATHETERIZATION   CLINICAL HISTORY:  Ms. Platte is 76 years old and two years ago underwent  diagnostic catheterization and was found to have nonobstructive disease by  Dr. Chales Abrahams.  She was enrolled in the Asteroid trial at that time and  underwent IVUS of the LAD and has been on Crestor since that time.  She now  returns for angiographic followup.   PROCEDURE:  The procedure was performed via the right femoral artery using  arterial sheath and 6 French preformed coronary catheters. A front wall  arterial puncture was performed and Omnipaque contrast was used.  After  completion of the diagnostic study, we performed IVUS on the LAD.  We used a  6 [place] JL-3.5 guiding catheter and a soft Asahi wire.  The patient was  given weight-adjusted heparin prolonging ACT to greater than 200 seconds and  was given intracoronary nitroglycerin.  We passed the wire down the LAD  without difficulty.  We passed the Atlantis catheter down to the mid LAD  past two large septal perforator and large diagonal branch.  We did  automatic pullback back to the left main and the catheter.  Repeat  diagnostic studies were then performed through the guiding catheter.  The  patient tolerated the procedure well and left the laboratory in satisfactory  condition.   RESULTS:  Left main coronary artery:  The left main coronary was free of  significant disease.   Left anterior descending artery:  The left anterior descending artery had  irregularities and 40% narrowing in the proximal LAD after the first large  septal perforator and  diagonal branch.  There was 30% narrowing in the mid  to distal vessel after second diagonal branch.   Circumflex artery:  The circumflex artery gave rise to a ramus branch and an  AV branch which terminated into three small posterior lateral branches.  These vessels were free of significant disease.   Right coronary artery:  The right coronary artery was a moderately large  vessel that gave rise to a conus branch, right ventricular branch, posterior  descending branch, and three small and one very large posterior lateral  branch.  These vessels were free of significant disease.   LEFT VENTRICULOGRAM:  The left ventriculogram performed in the RAO  projection showed good wall motion with no areas of hypokinesis.  The  estimated ejection fraction was 60%.   The IVUS measurements in the LAD showed moderate amount of plaque in the LAD  especially in the mid portion, but  there was no obstruction of the lumen.   CONCLUSIONS:  Nonobstructive coronary artery disease with 40% narrowing in  the proximal left anterior descending, 30% narrowing in the distal LAD, no  significant obstruction in the circumflex and right coronary artery with  normal LV function.   RECOMMENDATIONS:  The patient does not appear to have any angiographic  progression of disease over the past couple of years.  She has been placed  on open label Crestor 40 mg.  Will plan followup groin check in a week.                                               Charlies Constable, M.D. [facility]    BB/MEDQ  D:  11/07/2003  T:  11/09/2003  Job:  [id number]

## 2010-10-15 NOTE — Cardiovascular Report (Signed)
[place]. [facility]  Patient:    Julia [name], Julia [name] Visit Number: [id number] MRN: [id number]          Service Type: CAT Location: 2000 2041 01 Attending Physician:  Veneda Melter Dictated by:   Veneda Melter, M.D. Proc. Date: 10/25/01 Admit Date:  10/25/2001 Discharge Date: 10/26/2001   CC:         [facility]  Stacie Glaze, M.D. [facility]   Cardiac Catheterization  PROCEDURES PERFORMED: 1. Left heart catheterization. 2. Left ventriculogram. 3. Selective coronary angiography. 4. Intravascular ultrasound of the left anterior descending. 5. Perclose right femoral artery.  DIAGNOSES: 1. Mild coronary artery arteries by angiogram. 2. Normal left ventricular systolic function.  HISTORY: The patient is a 76 year old white female with a history of hypertension and chest discomfort and presents for further assessment. The patient underwent Cardiolite stress test showing mild inferior wall ischemia with well preserved LV function. Due to persistence of chest discomfort she presents for further assessment.  TECHNIQUE: After informed consent was obtained, the patient was brought to the cardiac catheterization lab where a 6 French sheath was placed in the right femoral artery. The 6 [place] and JR4 catheters were then used to engage the left and right coronary arteries and selective angiography performed in various projections using manual injections of contrast. The 6 French pigtail catheter was advanced to the left ventricle and a left ventriculogram performed using power injections of contrast. Preparations were then made for intravascular ultrasound of the LAD and the patient was given 2500 units of heparin intravenously. A 6 JL4 guide catheter was used as well as a Hi-Torque Floppy wire positioned in the distal LAD. Intravascular ultrasound was then performed in the LAD using automated pullback. Intracoronary nitroglycerin  was administered prior to and following intravascular ultrasound and repeat angiography showed no evidence of distal vessel damage. The guide catheter was then removed and the Perclose used for closure device, deployed in the right femoral artery until adequate hemostasis was achieved. The patient tolerated the procedure well and was transferred to the floor in stable condition.  FINDINGS: Findings are as follows: 1. Left main trunk: Large caliber vessel with mild irregularities. 2. LAD: This is a large caliber vessel that extends to the apex and gave    rise to two diagonal branches. The LAD has mild irregularities of 20-30%    in the proximal segment. There is then obstetric narrowing of 30-40%    in the mid section with mild disease of 30% distally. The LAD provides    two diagonal branches, the first of which bifurcates in its mid section.    It is a medium caliber vessel with mild disease of 20-30% at its ostium.    The second diagonal branch is slightly smaller and has mild disease of    30-40% in its ostium. 3. Left circumflex artery: This is a small caliber vessel that provides a    first marginal branch in its proximal segment and a bifurcating second    marginal branch distally. There are mild irregularities of 20-30% in the    left circumflex system. 4. Right coronary artery is dominant. This is a large caliber vessel that    provides the posterior descending artery and posterior ventricular branch    in its terminal segment. The right coronary artery has mild disease of    20% in the proximal segment with luminal irregularities in the remainder of    the vessel.  LEFT VENTRICULOGRAM: Normal  end-systolic and end-diastolic dimensions. Overall left ventricular function is well preserved, ejection fraction of greater than 55%.  No mitral regurgitation. LV pressure is 160/5, aortic is 160/70, LVEDP equals 16.  ASSESSMENT AND PLAN: The patient is a 76 year old female with  intimal chest pain and abnormal Cardiolite. She has mild coronary artery disease by angiogram and well preserved left ventricular function. Continued medical therapy will be recommended. The patient has undergone intravascular ultrasound f the LAD for enrollment in the ASTROID study and will be randomized to medical therapy for her dyslipidemia per protocol. Dictated by:   Veneda Melter, M.D. Attending Physician:  Veneda Melter DD:  10/25/01 TD:  10/26/01 Job: [id number]

## 2010-10-15 NOTE — Discharge Summary (Signed)
[place]. [facility]  Patient:    LEVETA, WAHAB Visit Number: [id number] MRN: [id number]          Service Type: CAT Location: 2000 2041 01 Attending Physician:  Veneda Melter Dictated by:   Brita Romp, P.A. Admit Date:  10/25/2001 Discharge Date: 10/26/2001   CC:         Stacie Glaze, M.D. [facility]   Discharge Summary  DISCHARGE DIAGNOSES: 1. Mild nonobstructive coronary artery disease. 2. Hypothyroidism. 3. Hypertension.  HISTORY OF PRESENT ILLNESS:  The patient is a 76 year old female who had had an abnormal stress test.  She was seen in the office on Oct 18, 2001, by Dr. Chales Abrahams, who recommended outpatient cardiac catheterization.  HOSPITAL COURSE:  On Oct 25, 2001, the patient was taken to the catheterization laboratory by Dr. Chales Abrahams.  Catheterization results: 1. Left main coronary artery:  Mild disease. 2. Left anterior descending to diagonal:  A 20-30% lesion proximally,    followed by 30-40% lesion in the mid vessel. 3. Left circumflex:  Two obtuse marginal branches, 20-30% disease. 4. Right coronary:  Large, dominant; 20% lesion proximally. 5. Left ventriculogram:  Ejection fraction greater than 55% with normal    mitral regurgitation, no wall motion abnormalities. 6. Dr. Chales Abrahams recommended continued medical management.  Postcatheterization and after her rest period, the patient reported feeling dizzy and lightheaded on arising.  She was found to be mildly orthostatic and given IV fluids.  The next day the patient was feeling somewhat better.  Initially she was again orthostatic.  However, this resolved over the course of the day.  At the time of discharge the patient was ambulating without dizziness or lightheadedness, and she was felt to be stable for discharge.  DISCHARGE MEDICATIONS: 1. Synthroid 80 mcg q.d. 2. Atenolol 25 mg q.d. 3. Imipramine 100 mg q.h.s. 4. Citrucel as previously taken. 5. Other vitamins and minerals as  previously taken.  DISCHARGE INSTRUCTIONS: 1. The patient is to avoid driving, heavy lifting, or tub baths for two days. 2. She is to follow a low-fat diet. 3. She is to watch the catheterization site for any pain, bleeding, or    swelling and call the [place] office if any of these problems. 4. Of note, the patient was enrolled in the A asteroid study.  The patient    will be contacted by the [facility] to be    started on a study medication one to two weeks after discharge.  She should    not be started on any kind of lipid-lowering medication, prescription or    otherwise, without consulting the research foundation. 5. She is to follow up with Dr. Lovell Sheehan as needed or as scheduled. Dictated by:   Brita Romp, P.A. Attending Physician:  Veneda Melter DD:  10/26/01 TD:  10/29/01 Job: [id number]

## 2010-10-18 ENCOUNTER — Ambulatory Visit (INDEPENDENT_AMBULATORY_CARE_PROVIDER_SITE_OTHER): Payer: PRIVATE HEALTH INSURANCE | Admitting: Internal Medicine

## 2010-10-18 ENCOUNTER — Encounter: Payer: Self-pay | Admitting: Internal Medicine

## 2010-10-18 DIAGNOSIS — I10 Essential (primary) hypertension: Secondary | ICD-10-CM

## 2010-10-18 DIAGNOSIS — K219 Gastro-esophageal reflux disease without esophagitis: Secondary | ICD-10-CM

## 2010-10-18 DIAGNOSIS — E785 Hyperlipidemia, unspecified: Secondary | ICD-10-CM

## 2010-10-18 NOTE — Progress Notes (Signed)
Subjective:    Patient ID: Julia [name], female    DOB: 08-Oct-1934, 76 y.o.   MRN: [id number]  HPI Patient is a 76 year old white female who presents for followup of hypertension hyperlipidemia and reflux she had lipid values drawn prior to this visit.  She is moderately anxious about her cholesterol but has tolerated the Crestor 20 mg once weekly.  Initial review of her blood work indicates significant improvement and she is at goal on all values.  Recheck of her blood pressure showed it had dropped into the normal range.  I suspect she was anxious at the initial part of the    Review of Systems  Constitutional: Negative for activity change, appetite change and fatigue.  HENT: Negative for ear pain, congestion, neck pain, postnasal drip and sinus pressure.   Eyes: Negative for redness and visual disturbance.  Respiratory: Negative for cough, shortness of breath and wheezing.   Gastrointestinal: Negative for abdominal pain and abdominal distention.  Genitourinary: Negative for dysuria, frequency and menstrual problem.  Musculoskeletal: Negative for myalgias, joint swelling and arthralgias.  Skin: Negative for rash and wound.  Neurological: Negative for dizziness, weakness and headaches.  Hematological: Negative for adenopathy. Does not bruise/bleed easily.  Psychiatric/Behavioral: Negative for sleep disturbance and decreased concentration.   Past Medical History  Diagnosis Date  . GERD (gastroesophageal reflux disease)   . Hypertension   . Hyperlipidemia   . Osteopenia   . History of colonic polyps   . Right forearm fracture 2011   Past Surgical History  Procedure Date  . Rotator cuff repair   . Bladder tack   . Tonsillectomy   . Bilateral salpingoophorectomy   . Surgical repair to fx left wrist   . Abdominal hysterectomy     reports that she has never smoked. She does not have any smokeless tobacco history on file. She reports that she does not drink alcohol or  use illicit drugs. family history includes Alzheimer's disease in her maternal aunt; COPD in her mother; Cancer in her father and maternal uncle; Drug abuse in her maternal uncle; Heart disease in her brother; Hypertension in her mother; and Lung cancer in an unspecified family member. Allergies  Allergen Reactions  . Entex   . Sulfonamide Derivatives        Objective:   Physical Exam  Constitutional: She is oriented to person, place, and time. She appears well-developed and well-nourished. No distress.  HENT:  Head: Normocephalic and atraumatic.  Right Ear: External ear normal.  Left Ear: External ear normal.  Nose: Nose normal.  Mouth/Throat: Oropharynx is clear and moist.  Eyes: Conjunctivae and EOM are normal. Pupils are equal, round, and reactive to light.  Neck: Normal range of motion. Neck supple. No JVD present. No tracheal deviation present. No thyromegaly present.  Cardiovascular: Normal rate, regular rhythm, normal heart sounds and intact distal pulses.   No murmur heard. Pulmonary/Chest: Effort normal and breath sounds normal. She has no wheezes. She exhibits no tenderness.  Abdominal: Soft. Bowel sounds are normal.  Musculoskeletal: Normal range of motion. She exhibits no edema and no tenderness.  Lymphadenopathy:    She has no cervical adenopathy.  Neurological: She is alert and oriented to person, place, and time. She has normal reflexes. No cranial nerve deficit.  Skin: Skin is warm and dry. She is not diaphoretic.  Psychiatric: She has a normal mood and affect. Her behavior is normal.          Assessment & Plan:  Stable  lipids on crestor weekly GERD stable TSH stable form fall 2011 No change in medications  Follow up in 4 months

## 2010-11-15 ENCOUNTER — Other Ambulatory Visit: Payer: Self-pay | Admitting: Internal Medicine

## 2010-11-15 NOTE — Telephone Encounter (Signed)
Dr. Jenkins pt. 

## 2011-02-11 ENCOUNTER — Other Ambulatory Visit (INDEPENDENT_AMBULATORY_CARE_PROVIDER_SITE_OTHER): Payer: PRIVATE HEALTH INSURANCE

## 2011-02-11 DIAGNOSIS — E785 Hyperlipidemia, unspecified: Secondary | ICD-10-CM

## 2011-02-11 DIAGNOSIS — I10 Essential (primary) hypertension: Secondary | ICD-10-CM

## 2011-02-11 LAB — BASIC METABOLIC PANEL
Calcium: 8.8 mg/dL (ref 8.4–10.5)
Creatinine, Ser: 1.2 mg/dL (ref 0.4–1.2)
GFR: 48.06 mL/min — ABNORMAL LOW (ref >60.00)
Glucose, Bld: 92 mg/dL (ref 70–99)
Sodium: 140 mEq/L (ref 135–145)

## 2011-02-11 LAB — HEPATIC FUNCTION PANEL
ALT: 22 U/L (ref 0–35)
AST: 30 U/L (ref 0–37)
Albumin: 4.1 g/dL (ref 3.5–5.2)
Alkaline Phosphatase: 48 U/L (ref 39–117)

## 2011-02-11 LAB — LIPID PANEL
Cholesterol: 188 mg/dL (ref 0–200)
LDL Cholesterol: 109 mg/dL — ABNORMAL HIGH (ref 0–99)
Total CHOL/HDL Ratio: 3
Triglycerides: 56 mg/dL (ref 0.0–149.0)

## 2011-02-14 ENCOUNTER — Other Ambulatory Visit: Payer: Self-pay | Admitting: Internal Medicine

## 2011-02-14 NOTE — Telephone Encounter (Signed)
Dr. Lovell Sheehan pt

## 2011-02-18 ENCOUNTER — Ambulatory Visit (INDEPENDENT_AMBULATORY_CARE_PROVIDER_SITE_OTHER): Payer: PRIVATE HEALTH INSURANCE | Admitting: Internal Medicine

## 2011-02-18 ENCOUNTER — Encounter: Payer: Self-pay | Admitting: Internal Medicine

## 2011-02-18 VITALS — BP 136/80 | HR 72 | Temp 98.2°F | Resp 16 | Ht 64.0 in | Wt 120.0 lb

## 2011-02-18 DIAGNOSIS — I10 Essential (primary) hypertension: Secondary | ICD-10-CM

## 2011-02-18 DIAGNOSIS — Z23 Encounter for immunization: Secondary | ICD-10-CM

## 2011-02-18 DIAGNOSIS — E785 Hyperlipidemia, unspecified: Secondary | ICD-10-CM

## 2011-02-18 DIAGNOSIS — T887XXA Unspecified adverse effect of drug or medicament, initial encounter: Secondary | ICD-10-CM

## 2011-02-18 DIAGNOSIS — J069 Acute upper respiratory infection, unspecified: Secondary | ICD-10-CM

## 2011-02-24 NOTE — Patient Instructions (Signed)
Continue all medications.

## 2011-02-24 NOTE — Progress Notes (Signed)
Subjective:    Patient ID: Julia [name], female    DOB: [birth year], 76 y.o.   MRN: [id number]  HPI  Issue is a 76 year old female who presents for six-month followup she has hypertension hyperlipidemia and gastroesophageal reflux.  She states that her reflux has been controlled as long she takes her medications she has rare breakthrough episodes of reflux.  Her blood pressure is well-controlled on her current medications she also has anxiety and has noted better control of her anxiety over the past 6 months without any episodes of panic attacks.  She is also noted no further chest pain or exertional shortness of breath.    Review of Systems  Constitutional: Negative for activity change, appetite change and fatigue.  HENT: Negative for ear pain, congestion, neck pain, postnasal drip and sinus pressure.   Eyes: Negative for redness and visual disturbance.  Respiratory: Negative for cough, shortness of breath and wheezing.   Gastrointestinal: Negative for abdominal pain and abdominal distention.  Genitourinary: Negative for dysuria, frequency and menstrual problem.  Musculoskeletal: Negative for myalgias, joint swelling and arthralgias.  Skin: Negative for rash and wound.  Neurological: Negative for dizziness, weakness and headaches.  Hematological: Negative for adenopathy. Does not bruise/bleed easily.  Psychiatric/Behavioral: Negative for sleep disturbance and decreased concentration.   Past Medical History  Diagnosis Date  . GERD (gastroesophageal reflux disease)   . Hypertension   . Hyperlipidemia   . Osteopenia   . History of colonic polyps   . Right forearm fracture 2011   Past Surgical History  Procedure Date  . Rotator cuff repair   . Bladder tack   . Tonsillectomy   . Bilateral salpingoophorectomy   . Surgical repair to fx left wrist   . Abdominal hysterectomy     reports that she has never smoked. She does not have any smokeless tobacco history on file. She  reports that she does not drink alcohol or use illicit drugs. family history includes Alzheimer's disease in her maternal aunt; COPD in her mother; Cancer in her father and maternal uncle; Drug abuse in her maternal uncle; Heart disease in her brother; Hypertension in her mother; and Lung cancer in an unspecified family member. Allergies  Allergen Reactions  . Entex   . Sulfonamide Derivatives         Objective:   Physical Exam  Nursing note and vitals reviewed. Constitutional: She is oriented to person, place, and time. She appears well-developed and well-nourished. No distress.  HENT:  Head: Normocephalic and atraumatic.  Right Ear: External ear normal.  Left Ear: External ear normal.  Nose: Nose normal.  Mouth/Throat: Oropharynx is clear and moist.  Eyes: Conjunctivae and EOM are normal. Pupils are equal, round, and reactive to light.  Neck: Normal range of motion. Neck supple. No JVD present. No tracheal deviation present. No thyromegaly present.  Cardiovascular: Normal rate, regular rhythm, normal heart sounds and intact distal pulses.   No murmur heard. Pulmonary/Chest: Effort normal and breath sounds normal. She has no wheezes. She exhibits no tenderness.  Abdominal: Soft. Bowel sounds are normal.  Musculoskeletal: Normal range of motion. She exhibits no edema and no tenderness.  Lymphadenopathy:    She has no cervical adenopathy.  Neurological: She is alert and oriented to person, place, and time. She has normal reflexes. No cranial nerve deficit.  Skin: Skin is warm and dry. She is not diaphoretic.  Psychiatric: She has a normal mood and affect. Her behavior is normal.  Assessment & Plan:   patient's blood pressure is well-controlled on her current medications.  Lipid monitor today with a lipid and liver.  Gastroesophageal reflux is stable.  Chronic anxiety with panic attacks appear to be resolved she's doing with the stress much better she has no further  atypical chest pain

## 2011-03-09 ENCOUNTER — Other Ambulatory Visit: Payer: Self-pay | Admitting: Internal Medicine

## 2011-03-09 ENCOUNTER — Other Ambulatory Visit: Payer: Self-pay

## 2011-03-09 DIAGNOSIS — Z1231 Encounter for screening mammogram for malignant neoplasm of breast: Secondary | ICD-10-CM

## 2011-03-25 ENCOUNTER — Ambulatory Visit
Admission: RE | Admit: 2011-03-25 | Discharge: 2011-03-25 | Disposition: A | Discharge status: Home / Self Care | Payer: Medicare Other | Source: Ambulatory Visit | Attending: Internal Medicine | Admitting: Internal Medicine

## 2011-03-25 DIAGNOSIS — Z1231 Encounter for screening mammogram for malignant neoplasm of breast: Secondary | ICD-10-CM

## 2011-03-30 ENCOUNTER — Other Ambulatory Visit: Payer: Self-pay | Admitting: Internal Medicine

## 2011-03-30 DIAGNOSIS — R928 Other abnormal and inconclusive findings on diagnostic imaging of breast: Secondary | ICD-10-CM

## 2011-04-19 ENCOUNTER — Ambulatory Visit
Admission: RE | Admit: 2011-04-19 | Discharge: 2011-04-19 | Disposition: A | Discharge status: Home / Self Care | Payer: Medicare Other | Source: Ambulatory Visit | Attending: Internal Medicine | Admitting: Internal Medicine

## 2011-04-19 DIAGNOSIS — R928 Other abnormal and inconclusive findings on diagnostic imaging of breast: Secondary | ICD-10-CM

## 2011-06-10 ENCOUNTER — Other Ambulatory Visit: Payer: PRIVATE HEALTH INSURANCE

## 2011-06-14 ENCOUNTER — Other Ambulatory Visit (INDEPENDENT_AMBULATORY_CARE_PROVIDER_SITE_OTHER): Payer: Medicare Other

## 2011-06-14 DIAGNOSIS — Z79899 Other long term (current) drug therapy: Secondary | ICD-10-CM

## 2011-06-14 DIAGNOSIS — Z0000 Encounter for general adult medical examination without abnormal findings: Secondary | ICD-10-CM

## 2011-06-14 LAB — POCT URINALYSIS DIPSTICK
Ketones, UA: NEGATIVE
Protein, UA: NEGATIVE
Spec Grav, UA: 1.015
pH, UA: 7.5

## 2011-06-14 LAB — LIPID PANEL
Cholesterol: 200 mg/dL (ref 0–200)
LDL Cholesterol: 116 mg/dL — ABNORMAL HIGH (ref 0–99)
Triglycerides: 70 mg/dL (ref 0.0–149.0)
VLDL: 14 mg/dL (ref 0.0–40.0)

## 2011-06-14 LAB — CBC WITH DIFFERENTIAL/PLATELET
Basophils Absolute: 0 10*3/uL (ref 0.0–0.1)
Eosinophils Absolute: 0.1 10*3/uL (ref 0.0–0.7)
HCT: 43.2 % (ref 36.0–46.0)
Lymphs Abs: 1.4 10*3/uL (ref 0.7–4.0)
MCV: 88.3 fl (ref 78.0–100.0)
Monocytes Absolute: 0.4 10*3/uL (ref 0.1–1.0)
Platelets: 239 10*3/uL (ref 150.0–400.0)
RDW: 14 % (ref 11.5–14.6)

## 2011-06-14 LAB — BASIC METABOLIC PANEL
BUN: 20 mg/dL (ref 6–23)
Chloride: 104 mEq/L (ref 96–112)
GFR: 49.47 mL/min — ABNORMAL LOW (ref >60.00)
Glucose, Bld: 93 mg/dL (ref 70–99)
Potassium: 4.4 mEq/L (ref 3.5–5.1)

## 2011-06-14 LAB — HEPATIC FUNCTION PANEL: Total Bilirubin: 0.5 mg/dL (ref 0.3–1.2)

## 2011-06-17 ENCOUNTER — Encounter: Payer: PRIVATE HEALTH INSURANCE | Admitting: Internal Medicine

## 2011-07-22 ENCOUNTER — Encounter: Payer: Self-pay | Admitting: Internal Medicine

## 2011-07-22 ENCOUNTER — Ambulatory Visit (INDEPENDENT_AMBULATORY_CARE_PROVIDER_SITE_OTHER): Payer: Medicare Other | Admitting: Internal Medicine

## 2011-07-22 VITALS — BP 136/78 | HR 72 | Temp 98.2°F | Resp 16 | Ht 63.0 in | Wt 118.0 lb

## 2011-07-22 DIAGNOSIS — Z0000 Encounter for general adult medical examination without abnormal findings: Secondary | ICD-10-CM

## 2011-07-22 DIAGNOSIS — Z23 Encounter for immunization: Secondary | ICD-10-CM

## 2011-07-22 NOTE — Patient Instructions (Signed)
The patient is instructed to continue all medications as prescribed. Schedule followup with check out clerk upon leaving the clinic  

## 2011-07-22 NOTE — Progress Notes (Signed)
Subjective:    Patient ID: Julia [name], female    DOB: November 23, 1934, 77 y.o.   MRN: [id number]  HPI  Patient is a fragile 77 year old white female who is followed for hyperlipidemia hypertension GERD and depression following the loss of her brother.  Her weight is stabilized her mood has stabilized and she is doing reasonably well on her current medications she presents today for a complete physical examination.    Review of Systems  Constitutional: Negative for activity change, appetite change and fatigue.  HENT: Negative for ear pain, congestion, neck pain, postnasal drip and sinus pressure.   Eyes: Negative for redness and visual disturbance.  Respiratory: Negative for cough, shortness of breath and wheezing.   Gastrointestinal: Negative for abdominal pain and abdominal distention.  Genitourinary: Negative for dysuria, frequency and menstrual problem.  Musculoskeletal: Negative for myalgias, joint swelling and arthralgias.  Skin: Negative for rash and wound.  Neurological: Negative for dizziness, weakness and headaches.  Hematological: Negative for adenopathy. Does not bruise/bleed easily.  Psychiatric/Behavioral: Negative for sleep disturbance and decreased concentration.   Past Medical History  Diagnosis Date  . GERD (gastroesophageal reflux disease)   . Hypertension   . Hyperlipidemia   . Osteopenia   . History of colonic polyps   . Right forearm fracture 2011    History   Social History  . Marital Status: Married    Spouse Name: N/A    Number of Children: N/A  . Years of Education: N/A   Occupational History  . retired    Social History Main Topics  . Smoking status: Never Smoker   . Smokeless tobacco: Not on file  . Alcohol Use: No  . Drug Use: No  . Sexually Active: Yes   Other Topics Concern  . Not on file   Social History Narrative  . No narrative on file    Past Surgical History  Procedure Date  . Rotator cuff repair   . Bladder tack   .  Tonsillectomy   . Bilateral salpingoophorectomy   . Surgical repair to fx left wrist   . Abdominal hysterectomy     Family History  Problem Relation Age of Onset  . Lung cancer    . Hypertension Mother   . COPD Mother   . Cancer Father   . Heart disease Brother   . Alzheimer's disease Maternal Aunt   . Drug abuse Maternal Uncle   . Cancer Maternal Uncle     Allergies  Allergen Reactions  . Entex   . Sulfonamide Derivatives     Current Outpatient Prescriptions on File Prior to Visit  Medication Sig Dispense Refill  . ALPRAZolam (XANAX) 0.25 MG tablet TAKE ONE TABLET BY MOUTH THREE TIMES DAILY AS NEEDED  60 tablet  3  . Ibuprofen 200 MG CAPS Take by mouth daily.        Marland Kitchen imipramine (TOFRANIL) 50 MG tablet TAKE ONE TABLET BY MOUTH EVERY DAY AT NOON AND TAKE TWO TABLETS IN THE EVENING  270 tablet  3  . KRILL OIL 1000 MG CAPS Take 2 each by mouth. Take two by mouth two times a day       . levothyroxine (SYNTHROID, LEVOTHROID) 88 MCG tablet TAKE ONE TABLET BY MOUTH EVERY DAY ON EMPTY STOMACH  90 tablet  3  . psyllium 0.52 G capsule Take 0.52 g by mouth daily.          BP 136/78  Pulse 72  Temp 98.2 F (36.8 C)  Resp 16  Ht 5\' 3"  (1.6 m)  Wt 118 lb (53.524 kg)  BMI 20.90 kg/m2       Objective:   Physical Exam  Nursing note and vitals reviewed. Constitutional: She is oriented to person, place, and time. She appears well-developed and well-nourished. No distress.  HENT:  Head: Normocephalic and atraumatic.  Right Ear: External ear normal.  Left Ear: External ear normal.  Nose: Nose normal.  Mouth/Throat: Oropharynx is clear and moist.  Eyes: Conjunctivae and EOM are normal. Pupils are equal, round, and reactive to light.  Neck: Normal range of motion. Neck supple. No JVD present. No tracheal deviation present. No thyromegaly present.  Cardiovascular: Normal rate, regular rhythm, normal heart sounds and intact distal pulses.   No murmur heard. Pulmonary/Chest: Effort  normal and breath sounds normal. She has no wheezes. She exhibits no tenderness.  Abdominal: Soft. Bowel sounds are normal.  Musculoskeletal: Normal range of motion. She exhibits no edema and no tenderness.  Lymphadenopathy:    She has no cervical adenopathy.  Neurological: She is alert and oriented to person, place, and time. She has normal reflexes. No cranial nerve deficit.  Skin: Skin is warm and dry. She is not diaphoretic.  Psychiatric: She has a normal mood and affect. Her behavior is normal.          Assessment & Plan:   This is a routine physical examination for this healthy  Female. Reviewed all health maintenance protocols including mammography colonoscopy bone density and reviewed appropriate screening labs. Her immunization history was reviewed as well as her current medications and allergies refills of her chronic medications were given and the plan for yearly health maintenance was discussed all orders and referrals were made as appropriate.  Stable cholesterol on the low stable thyroid on Synthroid stable depression with L. Tofranil when necessary coping with the loss of her brother appropriately

## 2011-07-27 ENCOUNTER — Encounter: Payer: Self-pay | Admitting: Gastroenterology

## 2011-08-05 ENCOUNTER — Encounter (HOSPITAL_COMMUNITY): Payer: Self-pay | Admitting: Emergency Medicine

## 2011-08-05 ENCOUNTER — Emergency Department (HOSPITAL_COMMUNITY): Payer: Medicare Other

## 2011-08-05 ENCOUNTER — Emergency Department (HOSPITAL_COMMUNITY)
Admission: EM | Admit: 2011-08-05 | Discharge: 2011-08-05 | Disposition: A | Discharge status: Home / Self Care | Payer: Medicare Other | Attending: Emergency Medicine | Admitting: Emergency Medicine

## 2011-08-05 DIAGNOSIS — Z9889 Other specified postprocedural states: Secondary | ICD-10-CM | POA: Insufficient documentation

## 2011-08-05 DIAGNOSIS — I10 Essential (primary) hypertension: Secondary | ICD-10-CM | POA: Insufficient documentation

## 2011-08-05 DIAGNOSIS — S0083XA Contusion of other part of head, initial encounter: Secondary | ICD-10-CM

## 2011-08-05 DIAGNOSIS — W1809XA Striking against other object with subsequent fall, initial encounter: Secondary | ICD-10-CM | POA: Insufficient documentation

## 2011-08-05 DIAGNOSIS — S0003XA Contusion of scalp, initial encounter: Secondary | ICD-10-CM | POA: Insufficient documentation

## 2011-08-05 DIAGNOSIS — W19XXXA Unspecified fall, initial encounter: Secondary | ICD-10-CM

## 2011-08-05 DIAGNOSIS — K219 Gastro-esophageal reflux disease without esophagitis: Secondary | ICD-10-CM | POA: Insufficient documentation

## 2011-08-05 DIAGNOSIS — R51 Headache: Secondary | ICD-10-CM | POA: Insufficient documentation

## 2011-08-05 DIAGNOSIS — E785 Hyperlipidemia, unspecified: Secondary | ICD-10-CM | POA: Insufficient documentation

## 2011-08-05 DIAGNOSIS — S0990XA Unspecified injury of head, initial encounter: Secondary | ICD-10-CM | POA: Insufficient documentation

## 2011-08-05 DIAGNOSIS — Z79899 Other long term (current) drug therapy: Secondary | ICD-10-CM | POA: Insufficient documentation

## 2011-08-05 DIAGNOSIS — R42 Dizziness and giddiness: Secondary | ICD-10-CM | POA: Insufficient documentation

## 2011-08-05 LAB — URINALYSIS, ROUTINE W REFLEX MICROSCOPIC
Glucose, UA: NEGATIVE mg/dL
Ketones, ur: NEGATIVE mg/dL
Leukocytes, UA: NEGATIVE
pH: 7 (ref 5.0–8.0)

## 2011-08-05 NOTE — ED Notes (Signed)
PT. SLIPPED AND FELL AT HOME THIS EVENING , NO LOC , HIT NOSE AGAINST FLOOR , PRESENTS WITH SLIGHT NASAL SWELLING , SLIGHT SORENESS AT NOSE AND FOREHEAD.

## 2011-08-05 NOTE — ED Notes (Signed)
Daughter of patient on phone--coming to hospital.

## 2011-08-05 NOTE — Discharge Instructions (Signed)
Use ice on the sore area 3 times a day. Take ibuprofen for pain. See your doctor or return here for problems.  Contusion A contusion is a deep bruise. Contusions are the result of an injury that caused bleeding under the skin. The contusion may turn blue, purple, or yellow. Minor injuries will give you a painless contusion, but more severe contusions may stay painful and swollen for a few weeks.  CAUSES  A contusion is usually caused by a blow, trauma, or direct force to an area of the body. SYMPTOMS   Swelling and redness of the injured area.   Bruising of the injured area.   Tenderness and soreness of the injured area.   Pain.  DIAGNOSIS  The diagnosis can be made by taking a history and physical exam. An X-ray, CT scan, or MRI may be needed to determine if there were any associated injuries, such as fractures. TREATMENT  Specific treatment will depend on what area of the body was injured. In general, the best treatment for a contusion is resting, icing, elevating, and applying cold compresses to the injured area. Over-the-counter medicines may also be recommended for pain control. Ask your caregiver what the best treatment is for your contusion. HOME CARE INSTRUCTIONS   Put ice on the injured area.   Put ice in a plastic bag.   Place a towel between your skin and the bag.   Leave the ice on for 15 to 20 minutes, 3 to 4 times a day.   Only take over-the-counter or prescription medicines for pain, discomfort, or fever as directed by your caregiver. Your caregiver may recommend avoiding anti-inflammatory medicines (aspirin, ibuprofen, and naproxen) for 48 hours because these medicines may increase bruising.   Rest the injured area.   If possible, elevate the injured area to reduce swelling.  SEEK IMMEDIATE MEDICAL CARE IF:   You have increased bruising or swelling.   You have pain that is getting worse.   Your swelling or pain is not relieved with medicines.  MAKE SURE YOU:     Understand these instructions.   Will watch your condition.   Will get help right away if you are not doing well or get worse.  Document Released: 02/23/2005 Document Revised: 05/05/2011 Document Reviewed: 03/21/2011 [facility] Patient Information 2012 [place], [place].Head Injury, Adult A head injury happens when the head is hit really hard. A head injury may cause sleepiness, headache, throwing up (vomiting), and problems seeing. If the head injury is really bad, you may need to stay in the hospital. HOME CARE  Have someone with you for the first 24 hours. This person should wake you up every 1 hour to check on your condition.   Only drink water or clear fluid for the rest of the day. Then, go back to your regular diet.   Only take medicines as told by your doctor. Do not take aspirin.   Do not drink alcohol for 2 days.   Do not take medicines that help your relax (sedatives) for 2 days.  Side effects may happen for up to 7 to 10 days. Watch for new problems. GET HELP RIGHT AWAY IF:   You are confused or sleepy.   You cannot be woken up.   You feel sick to your stomach (nauseous) or keep throwing up.   Your dizziness or unsteadiness is get worse, or your cannot walk.   You start to shake (convulse) or pass out (faint).   You have very bad, lasting  headaches that are not helped by medicine.   You cannot use your arms or legs like normal.   You have clear or bloody fluid coming from your nose or ears.  MAKE SURE YOU:   Understand these instructions.   Will watch your condition.   Will get help right away if you are not doing well or get worse.  Document Released: 04/28/2008 Document Revised: 05/05/2011 Document Reviewed: 04/01/2009 [facility] Patient Information 2012 [place], [place].

## 2011-08-05 NOTE — ED Provider Notes (Signed)
History     CSN: [id number]  Arrival date & time 08/05/11  0151   First MD Initiated Contact with Patient 08/05/11 0209      Chief Complaint  Patient presents with  . Fall    (Consider location/radiation/quality/duration/timing/severity/associated sxs/prior treatment) Patient is a 77 y.o. female presenting with fall. The history is provided by the patient.  Fall The accident occurred less than 1 hour ago. Incident: While walking in a darkened room. She became unbalanced and fell forward, striking her face. She fell from a height of 1 to 2 ft. She landed on carpet. The point of impact was the head. The pain is present in the head. The pain is mild. She was ambulatory at the scene. There was no entrapment after the fall. There was no drug use involved in the accident. There was no alcohol use involved in the accident. Pertinent negatives include no visual change, no abdominal pain, no nausea, no vomiting, no headaches, no hearing loss, no loss of consciousness and no tingling. Exacerbated by: Palpation to the nose causes tenderness. She has tried nothing for the symptoms.   The patient drove her car here here to be evaluated. She is concerned about head injury since her brother fell, struck his head and died 2 months ago.  Past Medical History  Diagnosis Date  . GERD (gastroesophageal reflux disease)   . Hypertension   . Hyperlipidemia   . Osteopenia   . History of colonic polyps   . Right forearm fracture 2011    Past Surgical History  Procedure Date  . Rotator cuff repair   . Bladder tack   . Tonsillectomy   . Bilateral salpingoophorectomy   . Surgical repair to fx left wrist   . Abdominal hysterectomy     Family History  Problem Relation Age of Onset  . Lung cancer    . Hypertension Mother   . COPD Mother   . Cancer Father   . Heart disease Brother   . Alzheimer's disease Maternal Aunt   . Drug abuse Maternal Uncle   . Cancer Maternal Uncle     History  Substance  Use Topics  . Smoking status: Never Smoker   . Smokeless tobacco: Not on file  . Alcohol Use: No    OB History    Grav Para Term Preterm Abortions TAB SAB Ect Mult Living                  Review of Systems  Gastrointestinal: Negative for nausea, vomiting and abdominal pain.  Neurological: Negative for tingling, loss of consciousness and headaches.  All other systems reviewed and are negative.    Allergies  Entex and Sulfonamide derivatives  Home Medications   Current Outpatient Rx  Name Route Sig Dispense Refill  . ALPRAZOLAM 0.25 MG PO TABS Oral Take 0.25 mg by mouth 2 (two) times daily as needed. For anxiety or sleep    . IBUPROFEN 200 MG PO CAPS Oral Take 200 mg by mouth 2 (two) times daily.     . IMIPRAMINE HCL 50 MG PO TABS Oral Take 50-100 mg by mouth 2 (two) times daily. 50 mg at noon and 100 mg at bedtime    . KRILL OIL 1000 MG PO CAPS Oral Take 2 each by mouth. Take two by mouth two times a day     . LEVOTHYROXINE SODIUM 88 MCG PO TABS  TAKE ONE TABLET BY MOUTH EVERY DAY ON EMPTY STOMACH 90 tablet 3  .  PSYLLIUM 0.52 G PO CAPS Oral Take 0.52 g by mouth daily as needed. For constipation      BP 191/84  Pulse 69  Temp(Src) 97.8 F (36.6 C) (Oral)  Resp 18  SpO2 97%  Physical Exam  Nursing note and vitals reviewed. Constitutional: She is oriented to person, place, and time. She appears well-developed and well-nourished.  HENT:  Head: Normocephalic and atraumatic.       No nasal tenderness or swelling  Eyes: Conjunctivae and EOM are normal. Pupils are equal, round, and reactive to light.  Neck: Normal range of motion and phonation normal. Neck supple.  Cardiovascular: Normal rate, regular rhythm and intact distal pulses.   Pulmonary/Chest: Effort normal and breath sounds normal. She exhibits no tenderness.  Abdominal: Soft. She exhibits no distension. There is no tenderness. There is no guarding.  Musculoskeletal: Normal range of motion.       Cervical spine  has negative criteria for Nexus  Neurological: She is alert and oriented to person, place, and time. She has normal strength. She exhibits normal muscle tone.  Skin: Skin is warm and dry.  Psychiatric: She has a normal mood and affect. Her behavior is normal. Judgment and thought content normal.    ED Course  Procedures (including critical care time) Initial assessment is minor fall, likely mechanical in nature, without debilitating disease.        Labs Reviewed  URINALYSIS, ROUTINE W REFLEX MICROSCOPIC  URINE CULTURE   Ct Head Wo Contrast  08/05/2011  *RADIOLOGY REPORT*  Clinical Data: Slipped and fell striking forehead and face, pain, dizziness  CT HEAD WITHOUT CONTRAST  Technique:  Contiguous axial images were obtained from the base of the skull through the vertex without contrast.  Comparison: None  Findings: Generalized atrophy. Normal ventricular morphology. No midline shift or mass effect. Small vessel chronic ischemic changes of deep cerebral white matter. No intracranial hemorrhage, mass lesion, or evidence of acute infarction. No extra-axial fluid collections. Mucosal thickening within a portion of the right ethmoid air cells. Remaining visualized paranasal sinuses and mastoid air cells clear. Skull intact.  IMPRESSION: Atrophy with small vessel chronic ischemic changes of deep cerebral white matter. No acute intracranial abnormalities.  Original Report Authenticated By: Lollie Marrow, M.D.     DX; 1- Fall        2- Contusion face   MDM  Fall without clear etiology, possible mechanical due to no light in the room, where she was walking. No serious injury. Normal urinalysis. Doubt serious bacterial illness, metabolic instability or acute CNS process. She is mild persistent, hypertension. Is not currently being treated        Flint Melter, MD 08/05/11 [phone]

## 2011-08-05 NOTE — ED Notes (Signed)
Daughter at bedside now.  

## 2011-08-06 LAB — URINE CULTURE
Colony Count: NO GROWTH
Culture  Setup Time: 201303081029

## 2011-08-08 ENCOUNTER — Ambulatory Visit (INDEPENDENT_AMBULATORY_CARE_PROVIDER_SITE_OTHER): Payer: Medicare Other | Admitting: Family

## 2011-08-08 ENCOUNTER — Other Ambulatory Visit: Payer: Self-pay | Admitting: Internal Medicine

## 2011-08-08 ENCOUNTER — Encounter: Payer: Self-pay | Admitting: Family

## 2011-08-08 VITALS — BP 160/78 | HR 75 | Ht 63.0 in | Wt 119.0 lb

## 2011-08-08 DIAGNOSIS — F341 Dysthymic disorder: Secondary | ICD-10-CM

## 2011-08-08 DIAGNOSIS — M8580 Other specified disorders of bone density and structure, unspecified site: Secondary | ICD-10-CM

## 2011-08-08 DIAGNOSIS — F329 Major depressive disorder, single episode, unspecified: Secondary | ICD-10-CM

## 2011-08-08 DIAGNOSIS — F419 Anxiety disorder, unspecified: Secondary | ICD-10-CM

## 2011-08-08 DIAGNOSIS — M949 Disorder of cartilage, unspecified: Secondary | ICD-10-CM

## 2011-08-08 DIAGNOSIS — R002 Palpitations: Secondary | ICD-10-CM

## 2011-08-08 MED ORDER — SERTRALINE HCL 25 MG PO TABS: 25.0000 mg | ORAL_TABLET | Freq: Every day | ORAL | Status: DC

## 2011-08-08 NOTE — Patient Instructions (Signed)
Grief Reaction  Grief is a normal response to the death of someone close to you. Feelings of fear, anger, and guilt can affect almost everyone who loses someone they love. Symptoms of depression are also common. These include problems with sleep, loss of appetite, and lack of energy. These grief reaction symptoms often last for weeks to months after a loss. They may also return during special times that remind you of the person you lost, such as an anniversary or birthday.  Anxiety, insomnia, irritability, and deep depression may last beyond the period of normal grief. If you experience these feelings for 6 months or longer, you may have clinical depression. Clinical depression requires further medical attention. If you think that you have clinical depression, you should contact your caregiver. If you have a history of depression and or a family history of depression, you are at greater risk of clinical depression. You are also at greater risk of developing clinical depression if the loss was traumatic or the loss was of someone with whom you had unresolved issues.    A grief reaction can become complicated by being blocked. This means being unable to cry or express extreme emotions. This may prolong the grieving period and worsen the emotional effects of the loss. Mourning is a natural event in human life. A healthy grief reaction is one that is not blocked . It requires a time of sadness and readjustment.It is very important to share your sorrow and fear with others, especially close friends and family. Professional counselors and clergy can also help you process your grief.  Document Released: 05/16/2005 Document Revised: 05/05/2011 Document Reviewed: 01/24/2006  [employer] Patient Information 2012 [employer].

## 2011-08-08 NOTE — Progress Notes (Signed)
Subjective:    Patient ID: Julia [name], female    DOB: Jun 05, 1934, 77 y.o.   MRN: [id number]  HPI  77 year old white female, patient of Dr. Lovell Sheehan as an as an ER followup. She went to the emergency department 3 days ago with a mechanical fall, tripping over a brother. Patient reports falling and hitting her face. She had a CT scan done at the emergency department that was normal. Today, she feels better. Has minor aches and pains.   Her family is in today and concerned about her depression and anxiety. 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 She also reports reuniting with her daughter from 9 years ago that has been stressful. She currently takes Xanax 0.25mg  per sleep and a Imapramine and 50 mg twice a day as needed. Still has crying spells.  Review of Systems  Constitutional: Negative.   HENT: Negative.   Respiratory: Negative.   Cardiovascular: Positive for palpitations.  Gastrointestinal: Negative.   Genitourinary: Negative.   Musculoskeletal: Negative.   Skin: Negative.   Neurological: Negative.   Hematological: Negative.   Psychiatric/Behavioral: Positive for confusion. The patient is nervous/anxious.    Past Medical History  Diagnosis Date  . GERD (gastroesophageal reflux disease)   . Hypertension   . Hyperlipidemia   . Osteopenia   . History of colonic polyps   . Right forearm fracture 2011    History   Social History  . Marital Status: Married    Spouse Name: N/A    Number of Children: N/A  . Years of Education: N/A   Occupational History  . retired    Social History Main Topics  . Smoking status: Never Smoker   . Smokeless tobacco: Not on file  . Alcohol Use: No  . Drug Use: No  . Sexually Active: Not on file   Other Topics Concern  . Not on file   Social History Narrative  . No narrative on file    Past Surgical History  Procedure Date  . Rotator cuff repair   .  Bladder tack   . Tonsillectomy   . Bilateral salpingoophorectomy   . Surgical repair to fx left wrist   . Abdominal hysterectomy     Family History  Problem Relation Age of Onset  . Lung cancer    . Hypertension Mother   . COPD Mother   . Cancer Father   . Heart disease Brother   . Alzheimer's disease Maternal Aunt   . Drug abuse Maternal Uncle   . Cancer Maternal Uncle     Allergies  Allergen Reactions  . Entex Other (See Comments)    unknown  . Sulfonamide Derivatives Other (See Comments)    unknown    Current Outpatient Prescriptions on File Prior to Visit  Medication Sig Dispense Refill  . ALPRAZolam (XANAX) 0.25 MG tablet Take 0.25 mg by mouth 2 (two) times daily as needed. For anxiety or sleep      . Ibuprofen 200 MG CAPS Take 200 mg by mouth 2 (two) times daily.       Marland Kitchen imipramine (TOFRANIL) 50 MG tablet Take 50-100 mg by mouth 2 (two) times daily. 50 mg at noon and 100 mg at bedtime      . KRILL OIL 1000 MG CAPS Take 2 each by mouth. Take two by mouth two times a day       . levothyroxine (SYNTHROID, LEVOTHROID) 88  MCG tablet TAKE ONE TABLET BY MOUTH EVERY DAY ON EMPTY STOMACH  90 tablet  3  . psyllium 0.52 G capsule Take 0.52 g by mouth daily as needed. For constipation        BP 160/78  Pulse 75  Ht 5\' 3"  (1.6 m)  Wt 119 lb (53.978 kg)  BMI 21.08 kg/m2  SpO2 94%chart    Objective:   Physical Exam  Constitutional: She is oriented to person, place, and time. She appears well-developed and well-nourished.  HENT:  Right Ear: External ear normal.  Left Ear: External ear normal.  Nose: Nose normal.  Mouth/Throat: Oropharynx is clear and moist.  Neck: Normal range of motion. Neck supple.  Cardiovascular: Normal rate, regular rhythm and normal heart sounds.   Pulmonary/Chest: Effort normal and breath sounds normal.  Abdominal: Soft. Bowel sounds are normal.  Neurological: She is alert and oriented to person, place, and time.  Skin: Skin is warm and dry.    Psychiatric: She has a normal mood and affect.   Recheck blood pressure 150/78   EKG: Within normal limits    Assessment & Plan:  Assessment:Anxiety and Depression, Acute grief reaction, Mechanical Fall, Palpitations  Plan: We'll start Zoloft 25 mg once a day to combat her depression and grief. Advised grief counseling. Patient will followup with Dr. Lovell Sheehan in 3 weeks. Recheck her blood pressure at that time and consider an antihypertensive agent if she is still hypertensive. At the family's request ordered a bone density scan.

## 2011-08-09 ENCOUNTER — Ambulatory Visit (INDEPENDENT_AMBULATORY_CARE_PROVIDER_SITE_OTHER)
Admission: RE | Admit: 2011-08-09 | Discharge: 2011-08-09 | Disposition: A | Discharge status: Home / Self Care | Payer: Medicare Other | Source: Ambulatory Visit

## 2011-08-09 DIAGNOSIS — M8580 Other specified disorders of bone density and structure, unspecified site: Secondary | ICD-10-CM

## 2011-08-09 DIAGNOSIS — M899 Disorder of bone, unspecified: Secondary | ICD-10-CM

## 2011-08-16 ENCOUNTER — Telehealth: Payer: Self-pay

## 2011-08-16 ENCOUNTER — Encounter: Payer: Self-pay | Admitting: Family

## 2011-08-16 MED ORDER — IBANDRONATE SODIUM 150 MG PO TABS: 150.0000 mg | ORAL_TABLET | ORAL | Status: DC

## 2011-08-16 NOTE — Telephone Encounter (Signed)
Pt aware of results and verbalized understanding. Rx sent to pharmacy 

## 2011-08-16 NOTE — Telephone Encounter (Signed)
Message copied by Beverely Low on Tue Aug 16, 2011  4:10 PM ------      Message from: [place], [place] B      Created: Tue Aug 16, 2011  3:29 PM       Osteoporosis revealed from the bone density study. Will need meds to strengthen the bones as well as 800u of Vitamin D and 1500mg  of Calcium a day. Start Boniva 150mg  q month. #1, 11 rf.

## 2011-09-02 ENCOUNTER — Ambulatory Visit (INDEPENDENT_AMBULATORY_CARE_PROVIDER_SITE_OTHER): Payer: Medicare Other | Admitting: Internal Medicine

## 2011-09-02 ENCOUNTER — Encounter: Payer: Self-pay | Admitting: Internal Medicine

## 2011-09-02 VITALS — BP 150/80 | HR 80 | Temp 98.6°F | Resp 16 | Ht 63.0 in | Wt 119.0 lb

## 2011-09-02 DIAGNOSIS — E785 Hyperlipidemia, unspecified: Secondary | ICD-10-CM

## 2011-09-02 DIAGNOSIS — I10 Essential (primary) hypertension: Secondary | ICD-10-CM

## 2011-09-02 DIAGNOSIS — K219 Gastro-esophageal reflux disease without esophagitis: Secondary | ICD-10-CM

## 2011-09-02 DIAGNOSIS — M949 Disorder of cartilage, unspecified: Secondary | ICD-10-CM

## 2011-09-02 MED ORDER — NADOLOL 20 MG PO TABS: 20.0000 mg | ORAL_TABLET | Freq: Every day | ORAL | Status: DC

## 2011-09-02 NOTE — Patient Instructions (Addendum)
The patient is instructed to continue all medications as prescribed. Schedule followup with check out clerk upon leaving the clinic Start the [facility] for the bone  Take on empty stomach but do not lie down for 1 hour after taking the medication. If you have trouble with this we can give you a yearly injection.   Start  Nadolol  20mg  one a day that will lower blood pressure, slow the pulse and help to prevent head ache.

## 2011-09-02 NOTE — Progress Notes (Signed)
Subjective:    Patient ID: Julia [name], female    DOB: 10-Jan-1935, 77 y.o.   MRN: [id number]  HPI Patient is a 77 year old female who presents for followup of high blood pressure recent falls and of her recent osteoporosis screening.  Her bone density showed that she is at risk for fracture and since she has had recent falls we will make sure that we help her to keep her bones strong as possible she was given a prescription for Boniva but has not started it yet she had questions about the medication side effects and when and how to take it  She also was noted to have an elevated blood pressure was in the emergency room when she had her fall and today with a slightly elevated pulse   Review of Systems  Constitutional: Negative for activity change, appetite change and fatigue.  HENT: Negative for ear pain, congestion, neck pain, postnasal drip and sinus pressure.   Eyes: Negative for redness and visual disturbance.  Respiratory: Negative for cough, shortness of breath and wheezing.   Gastrointestinal: Negative for abdominal pain and abdominal distention.  Genitourinary: Negative for dysuria, frequency and menstrual problem.  Musculoskeletal: Negative for myalgias, joint swelling and arthralgias.  Skin: Negative for rash and wound.  Neurological: Negative for dizziness, weakness and headaches.  Hematological: Negative for adenopathy. Does not bruise/bleed easily.  Psychiatric/Behavioral: Negative for sleep disturbance and decreased concentration.   Past Medical History  Diagnosis Date  . GERD (gastroesophageal reflux disease)   . Hypertension   . Hyperlipidemia   . Osteopenia   . History of colonic polyps   . Right forearm fracture 2011    History   Social History  . Marital Status: Married    Spouse Name: N/A    Number of Children: N/A  . Years of Education: N/A   Occupational History  . retired    Social History Main Topics  . Smoking status: Never Smoker   .  Smokeless tobacco: Not on file  . Alcohol Use: No  . Drug Use: No  . Sexually Active: Not on file   Other Topics Concern  . Not on file   Social History Narrative  . No narrative on file    Past Surgical History  Procedure Date  . Rotator cuff repair   . Bladder tack   . Tonsillectomy   . Bilateral salpingoophorectomy   . Surgical repair to fx left wrist   . Abdominal hysterectomy     Family History  Problem Relation Age of Onset  . Lung cancer    . Hypertension Mother   . COPD Mother   . Cancer Father   . Heart disease Brother   . Alzheimer's disease Maternal Aunt   . Drug abuse Maternal Uncle   . Cancer Maternal Uncle     Allergies  Allergen Reactions  . Entex Other (See Comments)    unknown  . Sulfonamide Derivatives Other (See Comments)    unknown    Current Outpatient Prescriptions on File Prior to Visit  Medication Sig Dispense Refill  . ALPRAZolam (XANAX) 0.25 MG tablet TAKE ONE TABLET BY MOUTH THREE TIMES DAILY AS NEEDED  60 tablet  3  . ibandronate (BONIVA) 150 MG tablet Take 1 tablet (150 mg total) by mouth every 30 (thirty) days. Take in the morning with a full glass of water, on an empty stomach, and do not take anything else by mouth or lie down for the next 30 min.  1  tablet  11  . Ibuprofen 200 MG CAPS Take 200 mg by mouth 2 (two) times daily.       Marland Kitchen imipramine (TOFRANIL) 50 MG tablet Take 50-100 mg by mouth 2 (two) times daily. 50 mg at noon and 100 mg at bedtime      . KRILL OIL 1000 MG CAPS Take 2 each by mouth. Take two by mouth two times a day       . levothyroxine (SYNTHROID, LEVOTHROID) 88 MCG tablet TAKE ONE TABLET BY MOUTH EVERY DAY ON EMPTY STOMACH  90 tablet  3  . psyllium 0.52 G capsule Take 0.52 g by mouth daily as needed. For constipation      . sertraline (ZOLOFT) 25 MG tablet Take 1 tablet (25 mg total) by mouth daily.  30 tablet  3  . nadolol (CORGARD) 20 MG tablet Take 1 tablet (20 mg total) by mouth daily.  30 tablet  11    BP  150/80  Pulse 80  Temp 98.6 F (37 C)  Resp 16  Ht 5\' 3"  (1.6 m)  Wt 119 lb (53.978 kg)  BMI 21.08 kg/m2       Objective:   Physical Exam  Nursing note and vitals reviewed. Constitutional: She is oriented to person, place, and time. She appears well-developed and well-nourished. No distress.  HENT:  Head: Normocephalic and atraumatic.  Right Ear: External ear normal.  Left Ear: External ear normal.  Nose: Nose normal.  Mouth/Throat: Oropharynx is clear and moist.  Eyes: Conjunctivae and EOM are normal. Pupils are equal, round, and reactive to light.  Neck: Normal range of motion. Neck supple. No JVD present. No tracheal deviation present. No thyromegaly present.  Cardiovascular: Normal rate, regular rhythm, normal heart sounds and intact distal pulses.   No murmur heard. Pulmonary/Chest: Effort normal and breath sounds normal. She has no wheezes. She exhibits no tenderness.  Abdominal: Soft. Bowel sounds are normal.  Musculoskeletal: Normal range of motion. She exhibits no edema and no tenderness.  Lymphadenopathy:    She has no cervical adenopathy.  Neurological: She is alert and oriented to person, place, and time. She has normal reflexes. No cranial nerve deficit.  Skin: Skin is warm and dry. She is not diaphoretic.  Psychiatric: She has a normal mood and affect. Her behavior is normal.          Assessment & Plan:    We will add nadolol 20 mg as a low-dose beta blocker to help control the pulse up to prevent headaches and to keep a blood pressure check.  I believe she will tolerate this medication very well we'll follow her back in one month to make sure.  For her bone density she will start the Boniva 150 mg once daily and we should be able to tell when she returns she is able to tolerate it if she has any question about being able to tolerate it we could use to once a year injectable form of bone density medications and we would set that up at the next visit if  necessary.

## 2011-10-03 ENCOUNTER — Ambulatory Visit (INDEPENDENT_AMBULATORY_CARE_PROVIDER_SITE_OTHER): Payer: Medicare Other | Admitting: Internal Medicine

## 2011-10-03 ENCOUNTER — Encounter: Payer: Self-pay | Admitting: Internal Medicine

## 2011-10-03 VITALS — BP 130/80 | HR 72 | Temp 98.6°F | Resp 16 | Ht 63.0 in | Wt 110.0 lb

## 2011-10-03 DIAGNOSIS — F4329 Adjustment disorder with other symptoms: Secondary | ICD-10-CM

## 2011-10-03 DIAGNOSIS — K219 Gastro-esophageal reflux disease without esophagitis: Secondary | ICD-10-CM

## 2011-10-03 DIAGNOSIS — F4321 Adjustment disorder with depressed mood: Secondary | ICD-10-CM

## 2011-10-03 DIAGNOSIS — I10 Essential (primary) hypertension: Secondary | ICD-10-CM

## 2011-10-03 DIAGNOSIS — E785 Hyperlipidemia, unspecified: Secondary | ICD-10-CM

## 2011-10-03 MED ORDER — SERTRALINE HCL 25 MG PO TABS: 50.0000 mg | ORAL_TABLET | Freq: Every day | ORAL | Status: DC

## 2011-10-03 NOTE — Progress Notes (Signed)
Subjective:    Patient ID: Julia [name], female    DOB: [birth year], 77 y.o.   MRN: [id number]  HPI  Still grieving The zoloft has not worked for her depression and she is wondering if she could increase the tofranil But the zoloft seems to be working  Review of Systems  Constitutional: Negative for activity change, appetite change and fatigue.  HENT: Negative for ear pain, congestion, neck pain, postnasal drip and sinus pressure.   Eyes: Negative for redness and visual disturbance.  Respiratory: Negative for cough, shortness of breath and wheezing.   Gastrointestinal: Negative for abdominal pain and abdominal distention.  Genitourinary: Negative for dysuria, frequency and menstrual problem.  Musculoskeletal: Negative for myalgias, joint swelling and arthralgias.  Skin: Negative for rash and wound.  Neurological: Negative for dizziness, weakness and headaches.  Hematological: Negative for adenopathy. Does not bruise/bleed easily.  Psychiatric/Behavioral: Negative for sleep disturbance and decreased concentration.   Past Medical History  Diagnosis Date  . GERD (gastroesophageal reflux disease)   . Hypertension   . Hyperlipidemia   . Osteopenia   . History of colonic polyps   . Right forearm fracture 2011    History   Social History  . Marital Status: Married    Spouse Name: N/A    Number of Children: N/A  . Years of Education: N/A   Occupational History  . retired    Social History Main Topics  . Smoking status: Never Smoker   . Smokeless tobacco: Not on file  . Alcohol Use: No  . Drug Use: No  . Sexually Active: Not on file   Other Topics Concern  . Not on file   Social History Narrative  . No narrative on file    Past Surgical History  Procedure Date  . Rotator cuff repair   . Bladder tack   . Tonsillectomy   . Bilateral salpingoophorectomy   . Surgical repair to fx left wrist   . Abdominal hysterectomy     Family History  Problem Relation Age  of Onset  . Lung cancer    . Hypertension Mother   . COPD Mother   . Cancer Father   . Heart disease Brother   . Alzheimer's disease Maternal Aunt   . Drug abuse Maternal Uncle   . Cancer Maternal Uncle     Allergies  Allergen Reactions  . Entex Other (See Comments)    unknown  . Sulfonamide Derivatives Other (See Comments)    unknown    Current Outpatient Prescriptions on File Prior to Visit  Medication Sig Dispense Refill  . ALPRAZolam (XANAX) 0.25 MG tablet TAKE ONE TABLET BY MOUTH THREE TIMES DAILY AS NEEDED  60 tablet  3  . ibandronate (BONIVA) 150 MG tablet Take 1 tablet (150 mg total) by mouth every 30 (thirty) days. Take in the morning with a full glass of water, on an empty stomach, and do not take anything else by mouth or lie down for the next 30 min.  1 tablet  11  . Ibuprofen 200 MG CAPS Take 200 mg by mouth 2 (two) times daily.       Marland Kitchen imipramine (TOFRANIL) 50 MG tablet Take 50-100 mg by mouth 2 (two) times daily. 50 mg at noon and 100 mg at bedtime      . KRILL OIL 1000 MG CAPS Take 2 each by mouth. Take two by mouth two times a day       . levothyroxine (SYNTHROID, LEVOTHROID) 88 MCG  tablet TAKE ONE TABLET BY MOUTH EVERY DAY ON EMPTY STOMACH  90 tablet  3  . nadolol (CORGARD) 20 MG tablet Take 1 tablet (20 mg total) by mouth daily.  30 tablet  11  . psyllium 0.52 G capsule Take 0.52 g by mouth daily as needed. For constipation      . sertraline (ZOLOFT) 25 MG tablet Take 1 tablet (25 mg total) by mouth daily.  30 tablet  3    BP 130/80  Pulse 72  Temp 98.6 F (37 C)  Resp 16  Ht 5\' 3"  (1.6 m)  Wt 110 lb (49.896 kg)  BMI 19.49 kg/m2       Objective:   Physical Exam  Nursing note and vitals reviewed. Constitutional: She is oriented to person, place, and time. She appears well-developed and well-nourished. No distress.  HENT:  Head: Normocephalic and atraumatic.  Right Ear: External ear normal.  Left Ear: External ear normal.  Nose: Nose normal.    Mouth/Throat: Oropharynx is clear and moist.  Eyes: Conjunctivae and EOM are normal. Pupils are equal, round, and reactive to light.  Neck: Normal range of motion. Neck supple. No JVD present. No tracheal deviation present. No thyromegaly present.  Cardiovascular: Normal rate, regular rhythm, normal heart sounds and intact distal pulses.   No murmur heard. Pulmonary/Chest: Effort normal and breath sounds normal. She has no wheezes. She exhibits no tenderness.  Abdominal: Soft. Bowel sounds are normal.  Musculoskeletal: Normal range of motion. She exhibits no edema and no tenderness.  Lymphadenopathy:    She has no cervical adenopathy.  Neurological: She is alert and oriented to person, place, and time. She has normal reflexes. No cranial nerve deficit.  Skin: Skin is warm and dry. She is not diaphoretic.  Psychiatric: She has a normal mood and affect. Her behavior is normal.          Assessment & Plan:  HTN stable Stressed appropriateness of grief and how to handle grief based upon the loss of her brother.  She is doing well we discussed her medications we discussed whether not she needed to talk to a grief counselor she believes her family supporting her at this point.  Her gastroesophageal reflux or hyperlipidemia stable

## 2011-10-03 NOTE — Patient Instructions (Addendum)
The patient is instructed to continue all medications as prescribed. Schedule followup with check out clerk upon leaving the clinic I am very proud of how you are doing!  Lets increase the zoloft  to 50 You may take either 2 of the 25  mg one of the 50s when he picked up a new prescription

## 2011-10-17 ENCOUNTER — Encounter: Payer: Self-pay | Admitting: Gastroenterology

## 2011-11-18 ENCOUNTER — Ambulatory Visit: Payer: Medicare Other | Admitting: Internal Medicine

## 2011-11-19 ENCOUNTER — Other Ambulatory Visit: Payer: Self-pay | Admitting: Internal Medicine

## 2011-11-29 ENCOUNTER — Ambulatory Visit (AMBULATORY_SURGERY_CENTER): Payer: Medicare Other | Admitting: *Deleted

## 2011-11-29 ENCOUNTER — Encounter: Payer: Self-pay | Admitting: Gastroenterology

## 2011-11-29 VITALS — Ht 63.0 in | Wt 120.0 lb

## 2011-11-29 DIAGNOSIS — Z1211 Encounter for screening for malignant neoplasm of colon: Secondary | ICD-10-CM

## 2011-11-29 MED ORDER — MOVIPREP 100 G PO SOLR: 1.0000 | Freq: Once | ORAL | Status: DC

## 2011-12-13 ENCOUNTER — Ambulatory Visit (AMBULATORY_SURGERY_CENTER): Payer: Medicare Other | Admitting: Gastroenterology

## 2011-12-13 ENCOUNTER — Encounter: Payer: Self-pay | Admitting: Gastroenterology

## 2011-12-13 VITALS — BP 137/75 | HR 57 | Temp 96.5°F | Resp 15 | Ht 63.0 in | Wt 120.0 lb

## 2011-12-13 DIAGNOSIS — D126 Benign neoplasm of colon, unspecified: Secondary | ICD-10-CM

## 2011-12-13 DIAGNOSIS — Z1211 Encounter for screening for malignant neoplasm of colon: Secondary | ICD-10-CM

## 2011-12-13 DIAGNOSIS — Z86010 Personal history of colonic polyps: Secondary | ICD-10-CM

## 2011-12-13 MED ORDER — SODIUM CHLORIDE 0.9 % IV SOLN: 500.0000 mL | INTRAVENOUS | Status: DC

## 2011-12-13 NOTE — Patient Instructions (Signed)
YOU HAD AN ENDOSCOPIC PROCEDURE TODAY AT THE  ENDOSCOPY CENTER: Refer to the procedure report that was given to you for any specific questions about what was found during the examination.  If the procedure report does not answer your questions, please call your gastroenterologist to clarify.  If you requested that your care partner not be given the details of your procedure findings, then the procedure report has been included in a sealed envelope for you to review at your convenience later.  YOU SHOULD EXPECT: Some feelings of bloating in the abdomen. Passage of more gas than usual.  Walking can help get rid of the air that was put into your GI tract during the procedure and reduce the bloating. If you had a lower endoscopy (such as a colonoscopy or flexible sigmoidoscopy) you may notice spotting of blood in your stool or on the toilet paper. If you underwent a bowel prep for your procedure, then you may not have a normal bowel movement for a few days.  DIET: Your first meal following the procedure should be a light meal and then it is ok to progress to your normal diet.  A half-sandwich or bowl of soup is an example of a good first meal.  Heavy or fried foods are harder to digest and may make you feel nauseous or bloated.  Likewise meals heavy in dairy and vegetables can cause extra gas to form and this can also increase the bloating.  Drink plenty of fluids but you should avoid alcoholic beverages for 24 hours.  ACTIVITY: Your care partner should take you home directly after the procedure.  You should plan to take it easy, moving slowly for the rest of the day.  You can resume normal activity the day after the procedure however you should NOT DRIVE or use heavy machinery for 24 hours (because of the sedation medicines used during the test).    SYMPTOMS TO REPORT IMMEDIATELY: A gastroenterologist can be reached at any hour.  During normal business hours, 8:30 AM to 5:00 PM Monday through Friday,  call [phone].  After hours and on weekends, please call the GI answering service at [phone] who will take a message and have the physician on call contact you.   Following lower endoscopy (colonoscopy or flexible sigmoidoscopy):  Excessive amounts of blood in the stool  Significant tenderness or worsening of abdominal pains  Swelling of the abdomen that is new, acute  Fever of 100F or higher    FOLLOW UP: If any biopsies were taken you will be contacted by phone or by letter within the next 1-3 weeks.  Call your gastroenterologist if you have not heard about the biopsies in 3 weeks.  Our staff will call the home number listed on your records the next business day following your procedure to check on you and address any questions or concerns that you may have at that time regarding the information given to you following your procedure. This is a courtesy call and so if there is no answer at the home number and we have not heard from you through the emergency physician on call, we will assume that you have returned to your regular daily activities without incident.  SIGNATURES/CONFIDENTIALITY: You and/or your care partner have signed paperwork which will be entered into your electronic medical record.  These signatures attest to the fact that that the information above on your After Visit Summary has been reviewed and is understood.  Full responsibility of the confidentiality   of this discharge information lies with you and/or your care-partner.    INFORMATION ON POLYPS ,DIVERTICULOSIS, HEMORRHOIDS, &HIGH FIBER DIET GIVEN TO YOU TODAY  

## 2011-12-13 NOTE — Op Note (Signed)
[facility] [address] [place], [place]  [address]  COLONOSCOPY PROCEDURE REPORT  PATIENT:  [name], Julia  MR#:  [id number] BIRTHDATE:  [birth year], 76 yrs. old  GENDER:  female ENDOSCOPIST:  Barbette Hair. Arlyce Dice, MD REF. BY: PROCEDURE DATE:  12/13/2011 PROCEDURE:  Colonoscopy with snare polypectomy ASA CLASS:  Class II INDICATIONS:  Screening, history of pre-cancerous (adenomatous) colon polyps Index polypectomy 1999 2006 colon - tics MEDICATIONS:   MAC sedation, administered by CRNA propofol 200mg IV  DESCRIPTION OF PROCEDURE:   After the risks benefits and alternatives of the procedure were thoroughly explained, informed consent was obtained.  Digital rectal exam was performed and revealed no abnormalities.   The LB [id number] endoscope was introduced through the anus and advanced to the cecum, which was identified by both the appendix and ileocecal valve, without limitations.  The quality of the prep was excellent, using MoviPrep.  The instrument was then slowly withdrawn as the colon was fully examined. <<PROCEDUREIMAGES>>  FINDINGS:  A sessile polyp was found in the ascending colon. It was 2 - 3 mm in size. Polyp was snared without cautery. Retrieval was successful (see image5). snare polyp  Scattered diverticula were found in the descending colon.  Internal Hemorrhoids were found (see image8).  This was otherwise a normal examination of the colon (see image4).   Retroflexed views in the rectum revealed no abnormalities.    The time to cecum =  1) 5.75  minutes. The scope was then withdrawn in  1) 11.50  minutes from the cecum and the procedure completed. COMPLICATIONS:  None ENDOSCOPIC IMPRESSION: 1) 2 - 3 mm sessile polyp in the ascending colon 2) Diverticula, scattered in the descending colon 3) Internal hemorrhoids 4) Otherwise normal examination RECOMMENDATIONS: 1) If the polyp(s) removed today are proven to be adenomatous (pre-cancerous)  polyps, you will need a repeat colonoscopy in 5 years. Otherwise you should continue to follow colorectal cancer screening guidelines for "routine risk" patients with colonoscopy in 10 years. You will receive a letter within 1-2 weeks with the results of your biopsy as well as final recommendations. Please call my office if you have not received a letter after 3 weeks. REPEAT EXAM:   You will receive a letter from Dr. Arlyce Dice in 1-2 weeks, after reviewing the final pathology, with followup recommendations.  ______________________________ Barbette Hair Arlyce Dice, MD  CC:  Stacie Glaze, MD  n. Rosalie Doctor:   Barbette Hair. Sandia Pfund at 12/13/2011 10:04 AM  Joya Martyr, [id number]

## 2011-12-13 NOTE — Progress Notes (Signed)
Patient did not have preoperative order for IV antibiotic SSI prophylaxis. (G8918)  Patient did not experience any of the following events: a burn prior to discharge; a fall within the facility; wrong site/side/patient/procedure/implant event; or a hospital transfer or hospital admission upon discharge from the facility. (G8907)  

## 2011-12-14 ENCOUNTER — Telehealth: Payer: Self-pay

## 2011-12-14 ENCOUNTER — Ambulatory Visit: Payer: Medicare Other | Admitting: Internal Medicine

## 2011-12-14 NOTE — Telephone Encounter (Signed)
  Follow up Call-  Call back number 12/13/2011  Post procedure Call Back phone  # [phone]  Permission to leave phone message No  comments does not want message left; will call us back     Patient questions:  Do you have a fever, pain , or abdominal swelling? no Pain Score  0 *  Have you tolerated food without any problems? yes  Have you been able to return to your normal activities? yes  Do you have any questions about your discharge instructions: Diet   no Medications  no Follow up visit  no  Do you have questions or concerns about your Care? no  Actions: * If pain score is 4 or above: No action needed, pain <4.

## 2011-12-21 ENCOUNTER — Encounter: Payer: Self-pay | Admitting: Gastroenterology

## 2012-02-28 ENCOUNTER — Encounter: Payer: Self-pay | Admitting: Gastroenterology

## 2012-02-28 ENCOUNTER — Ambulatory Visit (INDEPENDENT_AMBULATORY_CARE_PROVIDER_SITE_OTHER): Payer: Medicare Other | Admitting: Gastroenterology

## 2012-02-28 VITALS — BP 134/72 | HR 60 | Ht 63.0 in | Wt 121.0 lb

## 2012-02-28 DIAGNOSIS — Z86010 Personal history of colonic polyps: Secondary | ICD-10-CM

## 2012-02-28 DIAGNOSIS — K5900 Constipation, unspecified: Secondary | ICD-10-CM | POA: Insufficient documentation

## 2012-02-28 NOTE — Assessment & Plan Note (Signed)
Plan followup colonoscopy 2018 

## 2012-02-28 NOTE — Patient Instructions (Addendum)
Please take Metamucil one packet in water by mouth once daily. You can purchase Metamucil over the counter if it works   Please follow up as needed

## 2012-02-28 NOTE — Assessment & Plan Note (Signed)
She has constipation but is minimally symptomatic complaining only of decreased caliber stools.  Mediations #1 fiber supplementation. If this is unsuccessful I would consider a trial of Linzess

## 2012-02-28 NOTE — Progress Notes (Signed)
History of Present Illness: Pleasant 77 year old white female referred at the request of Dr. Lovell Sheehan for evaluation of constipation. This has been long-standing problem. She typically moves her bowels one to 2 times a week. She is without abdominal pain or bloating. Her main complaint is decreased stool caliber.  Colonoscopy in July, 2013 demonstrated a small adenomatous polyp and scattered diverticula.    Past Medical History  Diagnosis Date  . GERD (gastroesophageal reflux disease)   . Hypertension   . Hyperlipidemia   . Osteopenia   . History of colonic polyps   . Right forearm fracture 2011   Past Surgical History  Procedure Date  . Rotator cuff repair   . Bladder tack   . Tonsillectomy   . Bilateral salpingoophorectomy   . Surgical repair to fx left wrist   . Abdominal hysterectomy    family history includes Alzheimer's disease in her maternal aunt; COPD in her mother; Cancer in her father and maternal uncle; Drug abuse in her maternal uncle; Heart disease in her brother; Hypertension in her mother; Lung cancer in an unspecified family member; and Stomach cancer in her father.  There is no history of Colon cancer, and Esophageal cancer, and Rectal cancer, . Current Outpatient Prescriptions  Medication Sig Dispense Refill  . ALPRAZolam (XANAX) 0.25 MG tablet TAKE ONE TABLET BY MOUTH THREE TIMES DAILY AS NEEDED  60 tablet  3  . aspirin 81 MG tablet Take 81 mg by mouth daily.      . Ibuprofen 200 MG CAPS Take 200 mg by mouth 2 (two) times daily.       Marland Kitchen imipramine (TOFRANIL) 50 MG tablet Take 50-100 mg by mouth 2 (two) times daily. 50 mg at noon and 100 mg at bedtime      . KRILL OIL 1000 MG CAPS Take 2 each by mouth. Take two by mouth two times a day       . levothyroxine (SYNTHROID, LEVOTHROID) 88 MCG tablet TAKE ONE TABLET BY MOUTH EVERY DAY ON EMPTY STOMACH  90 tablet  3  . psyllium 0.52 G capsule Take 0.52 g by mouth daily as needed. For constipation       Allergies as of  02/28/2012 - Review Complete 02/28/2012  Allergen Reaction Noted  . Entex Other (See Comments)   . Sulfonamide derivatives Other (See Comments)     reports that she has never smoked. She has never used smokeless tobacco. She reports that she does not drink alcohol or use illicit drugs.     Review of Systems: Pertinent positive and negative review of systems were noted in the above HPI section. All other review of systems were otherwise negative.  Vital signs were reviewed in today's medical record Physical Exam: General: Well developed , well nourished, no acute distress Head: Normocephalic and atraumatic Eyes:  sclerae anicteric, EOMI Ears: Normal auditory acuity Mouth: No deformity or lesions Neck: Supple, no masses or thyromegaly Lungs: Clear throughout to auscultation Heart: Regular rate and rhythm; no murmurs, rubs or bruits Abdomen: Soft, non tender and non distended. No masses, hepatosplenomegaly or hernias noted. Normal Bowel sounds Rectal:deferred Musculoskeletal: Symmetrical with no gross deformities  Skin: No lesions on visible extremities Pulses:  Normal pulses noted Extremities: No clubbing, cyanosis, edema or deformities noted Neurological: Alert oriented x 4, grossly nonfocal Cervical Nodes:  No significant cervical adenopathy Inguinal Nodes: No significant inguinal adenopathy Psychological:  Alert and cooperative. Normal mood and affect

## 2012-03-01 ENCOUNTER — Ambulatory Visit (INDEPENDENT_AMBULATORY_CARE_PROVIDER_SITE_OTHER): Payer: Medicare Other | Admitting: Internal Medicine

## 2012-03-01 ENCOUNTER — Encounter: Payer: Self-pay | Admitting: Internal Medicine

## 2012-03-01 VITALS — BP 130/70 | HR 72 | Temp 98.2°F | Resp 16 | Ht 63.0 in | Wt 122.0 lb

## 2012-03-01 DIAGNOSIS — R5381 Other malaise: Secondary | ICD-10-CM

## 2012-03-01 DIAGNOSIS — E039 Hypothyroidism, unspecified: Secondary | ICD-10-CM

## 2012-03-01 DIAGNOSIS — R5383 Other fatigue: Secondary | ICD-10-CM

## 2012-03-01 DIAGNOSIS — M810 Age-related osteoporosis without current pathological fracture: Secondary | ICD-10-CM

## 2012-03-01 DIAGNOSIS — E785 Hyperlipidemia, unspecified: Secondary | ICD-10-CM

## 2012-03-01 DIAGNOSIS — Z23 Encounter for immunization: Secondary | ICD-10-CM

## 2012-03-01 DIAGNOSIS — R51 Headache: Secondary | ICD-10-CM

## 2012-03-01 DIAGNOSIS — I10 Essential (primary) hypertension: Secondary | ICD-10-CM

## 2012-03-01 LAB — LIPID PANEL
Cholesterol: 229 mg/dL — ABNORMAL HIGH (ref 0–200)
HDL: 57.8 mg/dL (ref >39.00)
Total CHOL/HDL Ratio: 4
Triglycerides: 150 mg/dL — ABNORMAL HIGH (ref 0.0–149.0)

## 2012-03-01 LAB — LDL CHOLESTEROL, DIRECT: Direct LDL: 146.2 mg/dL

## 2012-03-01 LAB — CALCIUM: Calcium: 9 mg/dL (ref 8.4–10.5)

## 2012-03-01 MED ORDER — CALCIUM-VITAMIN D 500-200 MG-UNIT PO TABS: 1.0000 | ORAL_TABLET | Freq: Two times a day (BID) | ORAL | Status: DC

## 2012-03-01 NOTE — Progress Notes (Signed)
Subjective:    Patient ID: Julia [name], female    DOB: December 22, 1934, 77 y.o.   MRN: [id number]  HPI Patient is a 77 year old female who presents today for followup of hypertension and for mild headaches.  Patient states that her headache pattern has remained mild and her blood pressure is well controlled she is compliant with her medications and she recently had a colonoscopy the results are in her chart  She has rare episodes of lightheadedness that last minutes. No chest pain. Monitoring for thyroid  Patient has a hx of GERD and is more sedentary with increased risks form boniva Will order reclast     Review of Systems  Constitutional: Positive for fatigue. Negative for activity change and appetite change.  HENT: Negative for ear pain, congestion, neck pain, postnasal drip and sinus pressure.   Eyes: Negative for redness and visual disturbance.  Respiratory: Negative for cough, shortness of breath and wheezing.   Gastrointestinal: Negative for abdominal pain and abdominal distention.  Genitourinary: Negative for dysuria, frequency and menstrual problem.  Musculoskeletal: Positive for myalgias, joint swelling and arthralgias.  Skin: Negative for rash and wound.  Neurological: Positive for dizziness. Negative for weakness and headaches.  Hematological: Negative for adenopathy. Does not bruise/bleed easily.  Psychiatric/Behavioral: Negative for disturbed wake/sleep cycle and decreased concentration.   Past Medical History  Diagnosis Date  . GERD (gastroesophageal reflux disease)   . Hypertension   . Hyperlipidemia   . Osteopenia   . History of colonic polyps   . Right forearm fracture 2011    History   Social History  . Marital Status: Married    Spouse Name: N/A    Number of Children: N/A  . Years of Education: N/A   Occupational History  . retired    Social History Main Topics  . Smoking status: Never Smoker   . Smokeless tobacco: Never Used  . Alcohol  Use: No  . Drug Use: No  . Sexually Active: Not on file   Other Topics Concern  . Not on file   Social History Narrative  . No narrative on file    Past Surgical History  Procedure Date  . Rotator cuff repair   . Bladder tack   . Tonsillectomy   . Bilateral salpingoophorectomy   . Surgical repair to fx left wrist   . Abdominal hysterectomy     Family History  Problem Relation Age of Onset  . Lung cancer    . Hypertension Mother   . COPD Mother   . Cancer Father   . Stomach cancer Father   . Heart disease Brother   . Alzheimer's disease Maternal Aunt   . Drug abuse Maternal Uncle   . Cancer Maternal Uncle   . Colon cancer Neg Hx   . Esophageal cancer Neg Hx   . Rectal cancer Neg Hx     Allergies  Allergen Reactions  . Entex Other (See Comments)    unknown  . Sulfonamide Derivatives Other (See Comments)    unknown    Current Outpatient Prescriptions on File Prior to Visit  Medication Sig Dispense Refill  . ALPRAZolam (XANAX) 0.25 MG tablet TAKE ONE TABLET BY MOUTH THREE TIMES DAILY AS NEEDED  60 tablet  3  . aspirin 81 MG tablet Take 81 mg by mouth daily.      . Ibuprofen 200 MG CAPS Take 200 mg by mouth 2 (two) times daily.       Marland Kitchen imipramine (TOFRANIL) 50 MG tablet  Take 50-100 mg by mouth 2 (two) times daily. 50 mg at noon and 100 mg at bedtime      . KRILL OIL 1000 MG CAPS Take 2 each by mouth. Take two by mouth two times a day       . levothyroxine (SYNTHROID, LEVOTHROID) 88 MCG tablet TAKE ONE TABLET BY MOUTH EVERY DAY ON EMPTY STOMACH  90 tablet  3  . psyllium 0.52 G capsule Take 0.52 g by mouth daily as needed. For constipation        BP 130/70  Pulse 72  Temp 98.2 F (36.8 C)  Resp 16  Ht 5\' 3"  (1.6 m)  Wt 122 lb (55.339 kg)  BMI 21.61 kg/m2        Objective:   Physical Exam  Nursing note and vitals reviewed. Constitutional: She is oriented to person, place, and time. She appears well-developed and well-nourished. No distress.  HENT:    Head: Normocephalic and atraumatic.  Right Ear: External ear normal.  Left Ear: External ear normal.  Nose: Nose normal.  Mouth/Throat: Oropharynx is clear and moist.  Eyes: Conjunctivae normal and EOM are normal. Pupils are equal, round, and reactive to light.  Neck: Normal range of motion. Neck supple. No JVD present. No tracheal deviation present. No thyromegaly present.  Cardiovascular: Normal rate, regular rhythm, normal heart sounds and intact distal pulses.   No murmur heard. Pulmonary/Chest: Effort normal and breath sounds normal. She has no wheezes. She exhibits no tenderness.  Abdominal: Soft. Bowel sounds are normal.  Musculoskeletal: Normal range of motion. She exhibits no edema and no tenderness.  Lymphadenopathy:    She has no cervical adenopathy.  Neurological: She is alert and oriented to person, place, and time. She has normal reflexes. No cranial nerve deficit.  Skin: Skin is warm and dry. She is not diaphoretic.  Psychiatric: She has a normal mood and affect. Her behavior is normal.          Assessment & Plan:  History of moderately severe reflux and the fact that she has become increasingly sedentary for her osteoporosis we are recommending that she begin reclast injections rather than boniva  Blood pressure stable without medications.  We will monitor her thyroid with a TSH and a T. for free.  Her irritable bowel and constipation are stable.  Her anxiety and grief reaction is improved

## 2012-03-01 NOTE — Patient Instructions (Signed)
You will get a call about your reclast... Stop the boniva

## 2012-03-15 ENCOUNTER — Telehealth: Payer: Self-pay | Admitting: *Deleted

## 2012-03-15 NOTE — Telephone Encounter (Signed)
Short stay called stating creatinine was high and coulnd proceed with reclast tomorrow.  Talked with pharmacy and they suggest we cancel reclast for tomorrow, have pt hydrate and then redraw creatinine and if ok reschedule reclast

## 2012-03-16 ENCOUNTER — Encounter (HOSPITAL_COMMUNITY): Admission: RE | Admit: 2012-03-16 | Payer: Medicare Other | Source: Ambulatory Visit

## 2012-03-19 ENCOUNTER — Other Ambulatory Visit (INDEPENDENT_AMBULATORY_CARE_PROVIDER_SITE_OTHER): Payer: Medicare Other

## 2012-03-19 DIAGNOSIS — Z5181 Encounter for therapeutic drug level monitoring: Secondary | ICD-10-CM

## 2012-03-19 DIAGNOSIS — Z7983 Long term (current) use of bisphosphonates: Secondary | ICD-10-CM

## 2012-03-19 LAB — BASIC METABOLIC PANEL
BUN: 23 mg/dL (ref 6–23)
CO2: 30 mEq/L (ref 19–32)
Chloride: 101 mEq/L (ref 96–112)
Creatinine, Ser: 1.1 mg/dL (ref 0.4–1.2)
Glucose, Bld: 98 mg/dL (ref 70–99)

## 2012-03-19 NOTE — Addendum Note (Signed)
Addended by: Rita Ohara R on: 03/19/2012 01:58 PM   Modules accepted: Orders

## 2012-05-18 ENCOUNTER — Other Ambulatory Visit: Payer: Self-pay | Admitting: Internal Medicine

## 2012-06-25 ENCOUNTER — Other Ambulatory Visit (INDEPENDENT_AMBULATORY_CARE_PROVIDER_SITE_OTHER): Payer: Medicare Other

## 2012-06-25 DIAGNOSIS — E785 Hyperlipidemia, unspecified: Secondary | ICD-10-CM

## 2012-06-25 DIAGNOSIS — E039 Hypothyroidism, unspecified: Secondary | ICD-10-CM

## 2012-06-25 LAB — T4, FREE: Free T4: 1.07 ng/dL (ref 0.60–1.60)

## 2012-06-25 LAB — TSH: TSH: 0.3 u[IU]/mL — ABNORMAL LOW (ref 0.35–5.50)

## 2012-06-25 LAB — LIPID PANEL: Total CHOL/HDL Ratio: 4

## 2012-07-02 ENCOUNTER — Ambulatory Visit (INDEPENDENT_AMBULATORY_CARE_PROVIDER_SITE_OTHER): Payer: Medicare Other | Admitting: Internal Medicine

## 2012-07-02 ENCOUNTER — Encounter: Payer: Self-pay | Admitting: Internal Medicine

## 2012-07-02 VITALS — BP 140/80 | HR 76 | Temp 97.9°F | Resp 18 | Wt 127.0 lb

## 2012-07-02 DIAGNOSIS — K219 Gastro-esophageal reflux disease without esophagitis: Secondary | ICD-10-CM

## 2012-07-02 DIAGNOSIS — I10 Essential (primary) hypertension: Secondary | ICD-10-CM

## 2012-07-02 DIAGNOSIS — E785 Hyperlipidemia, unspecified: Secondary | ICD-10-CM

## 2012-07-02 MED ORDER — ROSUVASTATIN CALCIUM 20 MG PO TABS: 20.0000 mg | ORAL_TABLET | ORAL | Status: DC

## 2012-07-02 NOTE — Patient Instructions (Signed)
Take the crestor 20 mg every Monday night

## 2012-07-02 NOTE — Progress Notes (Signed)
Subjective:    Patient ID: Julia [name], female    DOB: 01-Mar-1935, 78 y.o.   MRN: [id number]  HPI  IBS discussed the paleo diet Discussed gluten free diet Stable HTN Lipids not at goal reviewed blood work from before the visit gangion cyst on her right thumb  Review of Systems  Constitutional: Negative for activity change, appetite change and fatigue.  HENT: Negative for ear pain, congestion, neck pain, postnasal drip and sinus pressure.   Eyes: Negative for redness and visual disturbance.  Respiratory: Negative for cough, shortness of breath and wheezing.   Gastrointestinal: Negative for abdominal pain and abdominal distention.  Genitourinary: Negative for dysuria, frequency and menstrual problem.  Musculoskeletal: Positive for myalgias and joint swelling. Negative for arthralgias.  Skin: Negative for rash and wound.  Neurological: Negative for dizziness, weakness and headaches.  Hematological: Negative for adenopathy. Does not bruise/bleed easily.  Psychiatric/Behavioral: Negative for sleep disturbance and decreased concentration.   Past Medical History  Diagnosis Date  . GERD (gastroesophageal reflux disease)   . Hypertension   . Hyperlipidemia   . Osteopenia   . History of colonic polyps   . Right forearm fracture 2011    History   Social History  . Marital Status: Married    Spouse Name: N/A    Number of Children: N/A  . Years of Education: N/A   Occupational History  . retired    Social History Main Topics  . Smoking status: Never Smoker   . Smokeless tobacco: Never Used  . Alcohol Use: No  . Drug Use: No  . Sexually Active: Not on file   Other Topics Concern  . Not on file   Social History Narrative  . No narrative on file    Past Surgical History  Procedure Date  . Rotator cuff repair   . Bladder tack   . Tonsillectomy   . Bilateral salpingoophorectomy   . Surgical repair to fx left wrist   . Abdominal hysterectomy     Family  History  Problem Relation Age of Onset  . Lung cancer    . Hypertension Mother   . COPD Mother   . Cancer Father   . Stomach cancer Father   . Heart disease Brother   . Alzheimer's disease Maternal Aunt   . Drug abuse Maternal Uncle   . Cancer Maternal Uncle   . Colon cancer Neg Hx   . Esophageal cancer Neg Hx   . Rectal cancer Neg Hx     Allergies  Allergen Reactions  . Entex Other (See Comments)    unknown  . Sulfonamide Derivatives Other (See Comments)    unknown    Current Outpatient Prescriptions on File Prior to Visit  Medication Sig Dispense Refill  . ALPRAZolam (XANAX) 0.25 MG tablet TAKE ONE TABLET BY MOUTH THREE TIMES DAILY AS NEEDED  60 tablet  3  . aspirin 81 MG tablet Take 81 mg by mouth daily.      . Calcium Carbonate-Vitamin D (CALCIUM-VITAMIN D) 500-200 MG-UNIT per tablet Take 1 tablet by mouth 2 (two) times daily with a meal.      . Ibuprofen 200 MG CAPS Take 200 mg by mouth 2 (two) times daily.       Marland Kitchen imipramine (TOFRANIL) 50 MG tablet Take 50-100 mg by mouth 2 (two) times daily. 50 mg at noon and 100 mg at bedtime      . imipramine (TOFRANIL) 50 MG tablet TAKE ONE TABLET BY MOUTH EVERY DAY  AT NOON AND TWO IN THE EVENING  270 tablet  2  . KRILL OIL 1000 MG CAPS Take 2 each by mouth. Take two by mouth two times a day       . levothyroxine (SYNTHROID, LEVOTHROID) 88 MCG tablet TAKE ONE TABLET BY MOUTH EVERY DAY ON EMPTY STOMACH  90 tablet  3  . psyllium 0.52 G capsule Take 0.52 g by mouth daily as needed. For constipation        BP 140/80  Pulse 76  Temp 97.9 F (36.6 C) (Oral)  Resp 18  Wt 127 lb (57.607 kg)       Objective:   Physical Exam  Nursing note and vitals reviewed. Constitutional: She is oriented to person, place, and time. She appears well-developed and well-nourished. No distress.  HENT:  Head: Normocephalic and atraumatic.  Right Ear: External ear normal.  Left Ear: External ear normal.  Nose: Nose normal.  Mouth/Throat:  Oropharynx is clear and moist.  Eyes: Conjunctivae normal and EOM are normal. Pupils are equal, round, and reactive to light.  Neck: Normal range of motion. Neck supple. No JVD present. No tracheal deviation present. No thyromegaly present.  Cardiovascular: Normal rate, regular rhythm, normal heart sounds and intact distal pulses.   No murmur heard. Pulmonary/Chest: Effort normal and breath sounds normal. She has no wheezes. She exhibits no tenderness.  Abdominal: Soft. Bowel sounds are normal.  Musculoskeletal: Normal range of motion. She exhibits tenderness. She exhibits no edema.  Lymphadenopathy:    She has no cervical adenopathy.  Neurological: She is alert and oriented to person, place, and time. She has normal reflexes. No cranial nerve deficit.  Skin: Skin is warm and dry. She is not diaphoretic.  Psychiatric: She has a normal mood and affect. Her behavior is normal.          Assessment & Plan:  Aspirate ganglion cyst on her right thumb.  Blood pressure.  Lipids are not at goal We will try Crestor 20 mg once a week Monitor in 4 weeks

## 2012-10-13 ENCOUNTER — Other Ambulatory Visit: Payer: Self-pay | Admitting: Internal Medicine

## 2012-10-24 ENCOUNTER — Encounter (HOSPITAL_COMMUNITY): Payer: Self-pay

## 2012-10-24 ENCOUNTER — Emergency Department (HOSPITAL_COMMUNITY): Payer: Medicare Other

## 2012-10-24 ENCOUNTER — Emergency Department (HOSPITAL_COMMUNITY)
Admission: EM | Admit: 2012-10-24 | Discharge: 2012-10-24 | Disposition: A | Discharge status: Home / Self Care | Payer: Medicare Other | Attending: Emergency Medicine | Admitting: Emergency Medicine

## 2012-10-24 DIAGNOSIS — Z8659 Personal history of other mental and behavioral disorders: Secondary | ICD-10-CM | POA: Insufficient documentation

## 2012-10-24 DIAGNOSIS — Z79899 Other long term (current) drug therapy: Secondary | ICD-10-CM | POA: Insufficient documentation

## 2012-10-24 DIAGNOSIS — R079 Chest pain, unspecified: Secondary | ICD-10-CM

## 2012-10-24 DIAGNOSIS — R072 Precordial pain: Secondary | ICD-10-CM | POA: Insufficient documentation

## 2012-10-24 DIAGNOSIS — K219 Gastro-esophageal reflux disease without esophagitis: Secondary | ICD-10-CM | POA: Insufficient documentation

## 2012-10-24 DIAGNOSIS — M79609 Pain in unspecified limb: Secondary | ICD-10-CM | POA: Insufficient documentation

## 2012-10-24 DIAGNOSIS — Z7982 Long term (current) use of aspirin: Secondary | ICD-10-CM | POA: Insufficient documentation

## 2012-10-24 DIAGNOSIS — Z8739 Personal history of other diseases of the musculoskeletal system and connective tissue: Secondary | ICD-10-CM | POA: Insufficient documentation

## 2012-10-24 DIAGNOSIS — Z860100 Personal history of colon polyps, unspecified: Secondary | ICD-10-CM | POA: Insufficient documentation

## 2012-10-24 DIAGNOSIS — E785 Hyperlipidemia, unspecified: Secondary | ICD-10-CM | POA: Insufficient documentation

## 2012-10-24 DIAGNOSIS — I10 Essential (primary) hypertension: Secondary | ICD-10-CM | POA: Insufficient documentation

## 2012-10-24 DIAGNOSIS — E079 Disorder of thyroid, unspecified: Secondary | ICD-10-CM | POA: Insufficient documentation

## 2012-10-24 HISTORY — DX: Disorder of thyroid, unspecified: E07.9

## 2012-10-24 HISTORY — DX: Anxiety disorder, unspecified: F41.9

## 2012-10-24 LAB — BASIC METABOLIC PANEL
CO2: 25 mEq/L (ref 19–32)
Calcium: 9.5 mg/dL (ref 8.4–10.5)
Glucose, Bld: 101 mg/dL — ABNORMAL HIGH (ref 70–99)
Sodium: 140 mEq/L (ref 135–145)

## 2012-10-24 LAB — CBC WITH DIFFERENTIAL/PLATELET
Eosinophils Relative: 2 % (ref 0–5)
HCT: 44.8 % (ref 36.0–46.0)
Hemoglobin: 14.9 g/dL (ref 12.0–15.0)
Lymphocytes Relative: 27 % (ref 12–46)
Lymphs Abs: 1.8 10*3/uL (ref 0.7–4.0)
MCV: 88.2 fL (ref 78.0–100.0)
Monocytes Absolute: 0.5 10*3/uL (ref 0.1–1.0)
Platelets: 197 10*3/uL (ref 150–400)
RBC: 5.08 MIL/uL (ref 3.87–5.11)
WBC: 6.6 10*3/uL (ref 4.0–10.5)

## 2012-10-24 LAB — HEPATIC FUNCTION PANEL
Alkaline Phosphatase: 52 U/L (ref 39–117)
Indirect Bilirubin: 0.4 mg/dL (ref 0.3–0.9)
Total Bilirubin: 0.5 mg/dL (ref 0.3–1.2)
Total Protein: 6.6 g/dL (ref 6.0–8.3)

## 2012-10-24 LAB — POCT I-STAT TROPONIN I: Troponin i, poc: 0.01 ng/mL (ref 0.00–0.08)

## 2012-10-24 LAB — LIPASE, BLOOD: Lipase: 46 U/L (ref 11–59)

## 2012-10-24 MED ORDER — KETOROLAC TROMETHAMINE 30 MG/ML IJ SOLN
30.0000 mg | Freq: Once | INTRAMUSCULAR | Status: AC
  Administered 2012-10-24: 30 mg via INTRAVENOUS
  Filled 2012-10-24: qty 1

## 2012-10-24 MED ORDER — SODIUM CHLORIDE 0.9 % IV BOLUS (SEPSIS)
1000.0000 mL | Freq: Once | INTRAVENOUS | Status: AC
  Administered 2012-10-24: 1000 mL via INTRAVENOUS

## 2012-10-24 MED ORDER — FAMOTIDINE 20 MG PO TABS
40.0000 mg | ORAL_TABLET | Freq: Once | ORAL | Status: AC
  Administered 2012-10-24: 40 mg via ORAL
  Filled 2012-10-24: qty 2

## 2012-10-24 MED ORDER — ASPIRIN 325 MG PO TABS
325.0000 mg | ORAL_TABLET | Freq: Once | ORAL | Status: AC
  Administered 2012-10-24: 325 mg via ORAL
  Filled 2012-10-24: qty 1

## 2012-10-24 NOTE — ED Provider Notes (Signed)
Pt signed out to me by dr. Silverio Lay and repeat troponin neg, pt stable for d/c  Toy Baker, MD 10/24/12 1859

## 2012-10-24 NOTE — ED Provider Notes (Signed)
History     CSN: [id number]  Arrival date & time 10/24/12  1330   First MD Initiated Contact with Patient 10/24/12 1337      Chief Complaint  Patient presents with  . Chest Pain    (Consider location/radiation/quality/duration/timing/severity/associated sxs/prior treatment) The history is provided by the patient.  Julia [name] is a 78 y.o. female history of reflux, hypertension, hyperlipidemia here presenting with chest pain. She was driving her cat to get groomed today when she had substernal chest pain. It is a dull ache, with occasional radiation to R arm. No SOB. She stated that she has been more stressed out recently. No hx of CAD or cardiac stents. Nl stress echo 4 years ago.    Past Medical History  Diagnosis Date  . GERD (gastroesophageal reflux disease)   . Hypertension   . Hyperlipidemia   . Osteopenia   . History of colonic polyps   . Right forearm fracture 2011  . Anxiety   . Thyroid disease     Past Surgical History  Procedure Laterality Date  . Rotator cuff repair    . Bladder tack    . Tonsillectomy    . Bilateral salpingoophorectomy    . Surgical repair to fx left wrist    . Abdominal hysterectomy      Family History  Problem Relation Age of Onset  . Lung cancer    . Hypertension Mother   . COPD Mother   . Cancer Father   . Stomach cancer Father   . Heart disease Brother   . Alzheimer's disease Maternal Aunt   . Drug abuse Maternal Uncle   . Cancer Maternal Uncle   . Colon cancer Neg Hx   . Esophageal cancer Neg Hx   . Rectal cancer Neg Hx     History  Substance Use Topics  . Smoking status: Never Smoker   . Smokeless tobacco: Never Used  . Alcohol Use: No    OB History   Grav Para Term Preterm Abortions TAB SAB Ect Mult Living                  Review of Systems  Cardiovascular: Positive for chest pain.  All other systems reviewed and are negative.    Allergies  Entex and Sulfonamide derivatives  Home Medications    Current Outpatient Rx  Name  Route  Sig  Dispense  Refill  . acetaminophen (TYLENOL) 500 MG tablet   Oral   Take 500 mg by mouth at bedtime as needed for pain.         Marland Kitchen ALPRAZolam (XANAX) 0.25 MG tablet      TAKE ONE TABLET BY MOUTH THREE TIMES DAILY AS NEEDED   60 tablet   3   . aspirin 81 MG tablet   Oral   Take 81 mg by mouth daily.         . Calcium Carbonate-Vitamin D (CALCIUM-VITAMIN D) 500-200 MG-UNIT per tablet   Oral   Take 1 tablet by mouth 2 (two) times daily with a meal.         . Ibuprofen 200 MG CAPS   Oral   Take 200 mg by mouth 2 (two) times daily.          Marland Kitchen imipramine (TOFRANIL) 50 MG tablet   Oral   Take 50-100 mg by mouth 2 (two) times daily. 50 mg at noon and 100 mg at bedtime         .  imipramine (TOFRANIL) 50 MG tablet      TAKE ONE TABLET BY MOUTH EVERY DAY AT NOON AND TWO IN THE EVENING   270 tablet   2   . levothyroxine (SYNTHROID, LEVOTHROID) 88 MCG tablet      TAKE ONE TABLET BY MOUTH EVERY DAY ON EMPTY STOMACH   90 tablet   3   . EXPIRED: rosuvastatin (CRESTOR) 20 MG tablet   Oral   Take 1 tablet (20 mg total) by mouth once a week.   30 tablet   11   . rosuvastatin (CRESTOR) 20 MG tablet   Oral   Take 1 tablet (20 mg total) by mouth once a week.   90 tablet   3     BP 156/70  Pulse 58  Temp(Src) 96.8 F (36 C) (Oral)  Resp 18  SpO2 96%  Physical Exam  Nursing note and vitals reviewed. Constitutional: She is oriented to person, place, and time. She appears well-developed and well-nourished.  NAD   HENT:  Head: Normocephalic.  Mouth/Throat: Oropharynx is clear and moist.  Eyes: Conjunctivae are normal. Pupils are equal, round, and reactive to light.  Neck: Normal range of motion. Neck supple.  Cardiovascular: Normal rate, regular rhythm and normal heart sounds.   Pulmonary/Chest: Effort normal and breath sounds normal. No respiratory distress. She has no wheezes. She has no rales. She exhibits no  tenderness.  Abdominal: Soft. Bowel sounds are normal. She exhibits no distension. There is no tenderness. There is no rebound and no guarding.  Musculoskeletal: Normal range of motion. She exhibits no edema and no tenderness.  Neurological: She is alert and oriented to person, place, and time.  Skin: Skin is dry.  Psychiatric: She has a normal mood and affect. Her behavior is normal. Judgment and thought content normal.    ED Course  Procedures (including critical care time)  Labs Reviewed  BASIC METABOLIC PANEL - Abnormal; Notable for the following:    Glucose, Bld 101 (*)    GFR calc non Af Amer 48 (*)    GFR calc Af Amer 55 (*)    All other components within normal limits  CBC WITH DIFFERENTIAL  HEPATIC FUNCTION PANEL  LIPASE, BLOOD  POCT I-STAT TROPONIN I   Dg Chest 2 View  10/24/2012   *RADIOLOGY REPORT*  Clinical Data: Chest pain.  Hypertension.  Nonsmoker.  CHEST - 2 VIEW  Comparison: 11/07/2003  Findings: Underlying hyperinflation/COPD.  A mild pectus excavatum deformity. Midline trachea.  Normal heart size.  Tortuous descending thoracic aorta. No pleural effusion or pneumothorax. Clear lungs.  IMPRESSION: COPD/hyperinflation. No acute superimposed process.   Original Report Authenticated By: Jeronimo Greaves, M.D.     No diagnosis found.    Date: 10/24/2012  Rate: 56  Rhythm: normal sinus rhythm  QRS Axis: normal  Intervals: normal  ST/T Wave abnormalities: nonspecific ST changes  Conduction Disutrbances:left anterior fascicular block  Narrative Interpretation:   Old EKG Reviewed: unchanged    MDM  Julia [name] is a 78 y.o. female here with chest pain. Atypical, likely stress related. Low risk for ACS but given age and onset today, will get trop x 2. Not concerned about PE or dissection.   4:05 PM Trop neg x 1. Needs second troponin. I signed out to Dr. Freida Busman to f/u second trop.         Richardean Canal, MD 10/24/12 [phone]

## 2012-10-24 NOTE — ED Notes (Signed)
Patient report obtained, care taken over at this time.  

## 2012-10-24 NOTE — ED Notes (Signed)
Per ems- pt c/o "being stressed out" x2 wks. Pt has hx of anxiety, has not been taking anxiety med. Pt c/o intermittent, sharp cp on inspiration. Pt did not have any more cp on ems arrival. EKG unremarkable, NSR. Currently denies pain. 18g IV placed in LAC, pta. HR-60 BP-146/80 O2-98% on RA. RR-14

## 2012-10-24 NOTE — ED Notes (Signed)
Patient given discharge instructions for chest pain. No rx was provided. Advised to follow up with primary care or to return to this department if condition worsens. Patient voiced understanding of all instructions and had no further questions. Patient ambulated to lobby.

## 2012-10-26 ENCOUNTER — Ambulatory Visit (INDEPENDENT_AMBULATORY_CARE_PROVIDER_SITE_OTHER): Payer: Medicare Other | Admitting: Family Medicine

## 2012-10-26 ENCOUNTER — Encounter: Payer: Self-pay | Admitting: Family Medicine

## 2012-10-26 VITALS — BP 136/78 | Temp 97.7°F | Wt 130.0 lb

## 2012-10-26 DIAGNOSIS — K5900 Constipation, unspecified: Secondary | ICD-10-CM

## 2012-10-26 DIAGNOSIS — R0789 Other chest pain: Secondary | ICD-10-CM

## 2012-10-26 DIAGNOSIS — R071 Chest pain on breathing: Secondary | ICD-10-CM

## 2012-10-26 DIAGNOSIS — I10 Essential (primary) hypertension: Secondary | ICD-10-CM

## 2012-10-26 NOTE — Patient Instructions (Addendum)
Chest Wall Pain Chest wall pain is pain felt in or around the chest bones and muscles. It may take up to 6 weeks to get better. It may take longer if you are active. Chest wall pain can happen on its own. Other times, things like germs, injury, coughing, or exercise can cause the pain. HOME CARE   Avoid activities that make you tired or cause pain. Do not use heavy weights.  Put ice or heat on the sore area.  Put ice in a plastic bag.  Place a towel between your skin and the bag.  Leave the ice or heat on for 15-20 minutes for the first 2 days.  Only take medicine as told by your doctor.  Can take 400mg  ibuprofen twice daily as needed for pain  GET HELP RIGHT AWAY IF:   You have more pain or are very uncomfortable.  You have a fever.  Your chest pain gets worse.  You have new problems.  You feel sick to your stomach (nauseous) or throw up (vomit).  You start to sweat or feel lightheaded.  You have a cough with mucus (phlegm).  You cough up blood. MAKE SURE YOU:   Understand these instructions.  Will watch your condition.  Will get help right away if you are not doing well or get worse. Document Released: 11/02/2007 Document Revised: 08/08/2011 Document Reviewed: 01/10/2011 [facility] Patient Information 2014 [place], [place].

## 2012-10-26 NOTE — Progress Notes (Addendum)
Chief Complaint  Patient presents with  . post hospital follow up    HPI:  Acute visit for ED follow up for chest wall pain/atypical CP: -now feeling better -seen in ED 5/28 with neg work up for ACS, CXR with COPD, no acute process, EKG NSR per notes, neg troponins, CBC, CMP and lipase -reports: pain is in R chest wall - tender to touch, sharp intermittent pain, sometimes worse with certain movements and when seatbelt presses there - thinks it might be acid reflux as pepcid has seemed to help the pain, intermittent, occurs at rest, not initiated by activity, not sure if related to meals, has had some belching, does have chronic issues with constipation - bowels are hard and strained, doesn't take anything for the constipation - had normal BM a few days ago, eats lots of fruit -hx acid reflux -hx anxiety and more stressed out the usually lately -denies: fevers, chills, vomiting, diarrhea, weight loss  ROS: See pertinent positives and negatives per HPI.  Past Medical History  Diagnosis Date  . GERD (gastroesophageal reflux disease)   . Hypertension   . Hyperlipidemia   . Osteopenia   . History of colonic polyps   . Right forearm fracture 2011  . Anxiety   . Thyroid disease     Family History  Problem Relation Age of Onset  . Lung cancer    . Hypertension Mother   . COPD Mother   . Cancer Father   . Stomach cancer Father   . Heart disease Brother   . Alzheimer's disease Maternal Aunt   . Drug abuse Maternal Uncle   . Cancer Maternal Uncle   . Colon cancer Neg Hx   . Esophageal cancer Neg Hx   . Rectal cancer Neg Hx     History   Social History  . Marital Status: Married    Spouse Name: N/A    Number of Children: N/A  . Years of Education: N/A   Occupational History  . retired    Social History Main Topics  . Smoking status: Never Smoker   . Smokeless tobacco: Never Used  . Alcohol Use: No  . Drug Use: No  . Sexually Active: None   Other Topics Concern  .  None   Social History Narrative  . None    Current outpatient prescriptions:acetaminophen (TYLENOL) 500 MG tablet, Take 500 mg by mouth at bedtime as needed for pain., Disp: , Rfl: ;  ALPRAZolam (XANAX) 0.25 MG tablet, TAKE ONE TABLET BY MOUTH THREE TIMES DAILY AS NEEDED, Disp: 60 tablet, Rfl: 3;  aspirin 81 MG tablet, Take 81 mg by mouth daily., Disp: , Rfl:  Calcium Carbonate-Vitamin D (CALCIUM-VITAMIN D) 500-200 MG-UNIT per tablet, Take 1 tablet by mouth 2 (two) times daily with a meal., Disp: , Rfl: ;  famotidine (PEPCID) 20 MG tablet, Take 20 mg by mouth 2 (two) times daily., Disp: , Rfl: ;  Ibuprofen 200 MG CAPS, Take 200 mg by mouth 2 (two) times daily. , Disp: , Rfl:  imipramine (TOFRANIL) 50 MG tablet, Take 50-100 mg by mouth 2 (two) times daily. 50 mg at noon and 100 mg at bedtime, Disp: , Rfl: ;  imipramine (TOFRANIL) 50 MG tablet, TAKE ONE TABLET BY MOUTH EVERY DAY AT NOON AND TWO IN THE EVENING, Disp: 270 tablet, Rfl: 2;  levothyroxine (SYNTHROID, LEVOTHROID) 88 MCG tablet, TAKE ONE TABLET BY MOUTH EVERY DAY ON EMPTY STOMACH, Disp: 90 tablet, Rfl: 3 rosuvastatin (CRESTOR) 20 MG tablet, Take  1 tablet (20 mg total) by mouth once a week., Disp: 90 tablet, Rfl: 3;  rosuvastatin (CRESTOR) 20 MG tablet, Take 1 tablet (20 mg total) by mouth once a week., Disp: 30 tablet, Rfl: 11  EXAM:  Filed Vitals:   10/26/12 1346  BP: 136/78  Temp: 97.7 F (36.5 C)    Body mass index is 23.03 kg/(m^2).  GENERAL: vitals reviewed and listed above, alert, oriented, appears well hydrated and in no acute distress  HEENT: atraumatic, conjunttiva clear, no obvious abnormalities on inspection of external nose and ears  NECK: no obvious masses on inspection  LUNGS: clear to auscultation bilaterally, no wheezes, rales or rhonchi, good air movement  CV: HRRR, no peripheral edema  MS: moves all extremities without noticeable abnormality -TTP attachement of lower ant ribs to costal cartilage on the R -  reproduces pain  PSYCH: pleasant and cooperative, no obvious depression or anxiety  ASSESSMENT AND PLAN:  Discussed the following assessment and plan:  Right-sided chest wall pain  Unspecified constipation  HYPERTENSION  -pain is localized to chest wall and reproduced on exam, pt reports exam of chest wall not done in ED - advised heat, ibuprofen, gentle activity, follow up as scheduled with PCP -advised can take the Pepcid if helps the belching, but not likely to help the pain -for constipation advised penty of fluids, prune juice or fibr supplement daily, mirilax if needed -BP much better after sitting for 5 minutes, advised recheck with follow up -Patient advised to return or notify a doctor immediately if symptoms worsen or persist or new concerns arise.  Patient Instructions  Chest Wall Pain Chest wall pain is pain felt in or around the chest bones and muscles. It may take up to 6 weeks to get better. It may take longer if you are active. Chest wall pain can happen on its own. Other times, things like germs, injury, coughing, or exercise can cause the pain. HOME CARE   Avoid activities that make you tired or cause pain. Do not use heavy weights.  Put ice or heat on the sore area.  Put ice in a plastic bag.  Place a towel between your skin and the bag.  Leave the ice or heat on for 15-20 minutes for the first 2 days.  Only take medicine as told by your doctor.  Can take 400mg  ibuprofen twice daily as needed for pain  GET HELP RIGHT AWAY IF:   You have more pain or are very uncomfortable.  You have a fever.  Your chest pain gets worse.  You have new problems.  You feel sick to your stomach (nauseous) or throw up (vomit).  You start to sweat or feel lightheaded.  You have a cough with mucus (phlegm).  You cough up blood. MAKE SURE YOU:   Understand these instructions.  Will watch your condition.  Will get help right away if you are not doing well or get  worse. Document Released: 11/02/2007 Document Revised: 08/08/2011 Document Reviewed: 01/10/2011 [facility] Patient Information 2014 Lindsay, Lona Kettle, Dahlia Client R.

## 2012-11-05 ENCOUNTER — Ambulatory Visit (INDEPENDENT_AMBULATORY_CARE_PROVIDER_SITE_OTHER): Payer: Medicare Other | Admitting: Internal Medicine

## 2012-11-05 ENCOUNTER — Encounter: Payer: Self-pay | Admitting: Internal Medicine

## 2012-11-05 VITALS — BP 130/80 | HR 72 | Temp 98.6°F | Resp 16 | Ht 63.0 in | Wt 125.0 lb

## 2012-11-05 DIAGNOSIS — R0789 Other chest pain: Secondary | ICD-10-CM

## 2012-11-05 DIAGNOSIS — R071 Chest pain on breathing: Secondary | ICD-10-CM

## 2012-11-05 DIAGNOSIS — R002 Palpitations: Secondary | ICD-10-CM

## 2012-11-05 DIAGNOSIS — F0280 Dementia in other diseases classified elsewhere without behavioral disturbance: Secondary | ICD-10-CM

## 2012-11-05 DIAGNOSIS — I10 Essential (primary) hypertension: Secondary | ICD-10-CM

## 2012-11-05 DIAGNOSIS — G309 Alzheimer's disease, unspecified: Secondary | ICD-10-CM

## 2012-11-05 MED ORDER — DONEPEZIL HCL 5 MG PO TABS: 5.0000 mg | ORAL_TABLET | Freq: Every evening | ORAL | Status: DC | PRN

## 2012-11-05 NOTE — Progress Notes (Signed)
  Subjective:    Patient ID: Julia [name], female    DOB: 1935/03/10, 78 y.o.   MRN: [id number]  HPI Atypical chest wall pain extended into the ack and arm and felt cold and clammy She was seen in the ER EKG unchanged Enzymes negative Was observed in ER  Family concerned with memory   Review of Systems  Constitutional: Negative for activity change, appetite change and fatigue.  HENT: Negative for ear pain, congestion, neck pain, postnasal drip and sinus pressure.   Eyes: Negative for redness and visual disturbance.  Respiratory: Negative for cough, shortness of breath and wheezing.   Gastrointestinal: Negative for abdominal pain and abdominal distention.  Genitourinary: Negative for dysuria, frequency and menstrual problem.  Musculoskeletal: Negative for myalgias, joint swelling and arthralgias.  Skin: Negative for rash and wound.  Neurological: Negative for dizziness, weakness and headaches.  Hematological: Negative for adenopathy. Does not bruise/bleed easily.  Psychiatric/Behavioral: Negative for sleep disturbance and decreased concentration.       Objective:   Physical Exam  Constitutional: She is oriented to person, place, and time. She appears well-developed and well-nourished. No distress.  HENT:  Head: Normocephalic and atraumatic.  Right Ear: External ear normal.  Left Ear: External ear normal.  Nose: Nose normal.  Mouth/Throat: Oropharynx is clear and moist.  Eyes: Conjunctivae and EOM are normal. Pupils are equal, round, and reactive to light.  Neck: Normal range of motion. Neck supple. No JVD present. No tracheal deviation present. No thyromegaly present.  Cardiovascular: Normal rate, regular rhythm, normal heart sounds and intact distal pulses.   No murmur heard. Pulmonary/Chest: Effort normal and breath sounds normal. She has no wheezes. She exhibits no tenderness.  Abdominal: Soft. Bowel sounds are normal.  Musculoskeletal: Normal range of motion. She  exhibits no edema and no tenderness.  Lymphadenopathy:    She has no cervical adenopathy.  Neurological: She is alert and oriented to person, place, and time. She has normal reflexes. No cranial nerve deficit.  Skin: Skin is warm and dry. She is not diaphoretic.  Psychiatric: She has a normal mood and affect. Her behavior is normal.    Failed clock tw ice and failed the reversal of number Probable alzhiemers vs OBS       Assessment & Plan:  Memory issues Possible short term memory issues Gaps in long term memory HTN stable Neurology referral for testing For memory loss Cyst on  Thumb aspirated Possible removal of the tofranil?

## 2012-11-05 NOTE — Patient Instructions (Addendum)
Heberden's node  Aspiration  Memory exercises  Take the Aricept 5 mg on a full stomach

## 2012-11-07 ENCOUNTER — Telehealth: Payer: Self-pay | Admitting: Neurology

## 2012-11-07 ENCOUNTER — Telehealth: Payer: Self-pay | Admitting: Internal Medicine

## 2012-11-07 MED ORDER — NADOLOL 40 MG PO TABS: 40.0000 mg | ORAL_TABLET | Freq: Every day | ORAL | Status: DC

## 2012-11-07 NOTE — Telephone Encounter (Signed)
Julia [name]/Daughter [phone] called to follow up BP medication, Nadolol, that was not on Julia [name]'s medication list after office visit 11/05/12.  Per Epic, informed it was discontinued 09/02/11.  Pt was taking it every other day then discontinued it.  After a recent angina episode 2 weeks ago, she resumed taking it daily.  Please call Julia [name] to advise if Julia [name] is supposed to be taking Nadolol now, verify the dose, explain why was it discontinued and confirm if there are any HIPAA or HCPOA forms needed by office to permit Julia [name]'s children to contact office on her behalf.

## 2012-11-07 NOTE — Telephone Encounter (Signed)
Should be on nadolol - daughter informed and med called to pharmacy-daughter informed she needs dpr signed and she can do at next ov.

## 2012-11-08 ENCOUNTER — Other Ambulatory Visit: Payer: Self-pay | Admitting: *Deleted

## 2012-11-12 ENCOUNTER — Telehealth: Payer: Self-pay | Admitting: Internal Medicine

## 2012-11-12 NOTE — Telephone Encounter (Signed)
Caller states pt was put on Aricept for memory.  Pt states she does not want to take the medication because it is making her feel nauseated and she vomited once.  Caller states pt is not really accepting the diagnosis of "memory loss" and is not compliant with medication.  Daughter wants to speak with Dr Lovell Sheehan to see what she needs to do .  OFFICE, please see if Dr Lovell Sheehan could call daughter back to discuss plan of care of patient.

## 2012-11-12 NOTE — Telephone Encounter (Signed)
Per dr Lovell Sheehan- stop medication and wait and discuss further meds with neurologist

## 2012-11-14 ENCOUNTER — Other Ambulatory Visit: Payer: Self-pay | Admitting: Internal Medicine

## 2012-11-17 ENCOUNTER — Emergency Department (HOSPITAL_COMMUNITY): Admission: EM | Admit: 2012-11-17 | Payer: Medicare Other | Source: Home / Self Care

## 2012-11-29 ENCOUNTER — Encounter: Payer: Self-pay | Admitting: Neurology

## 2012-11-29 ENCOUNTER — Ambulatory Visit (INDEPENDENT_AMBULATORY_CARE_PROVIDER_SITE_OTHER): Payer: Medicare Other | Admitting: Neurology

## 2012-11-29 VITALS — BP 138/70 | HR 52 | Temp 97.6°F | Ht 63.0 in | Wt 123.0 lb

## 2012-11-29 DIAGNOSIS — R413 Other amnesia: Secondary | ICD-10-CM

## 2012-11-29 NOTE — Patient Instructions (Addendum)
1.  We will get an MRI of the brain to look for any structural cause of memory problems. 2.  Pending results of MRI, consider neuropsychological testing. 3.  Consider seeing a psychologist or Child psychotherapist. 4.  Follow up in 3 months. 5.  Check vitamin B12 6.  Drive only to familiar places during daylight hours and with someone in the car.  Your MRI is scheduled at [facility] on Tuesday, July 8th at 1:00pm. Please check in at the first floor radiology department 15 minutes prior to your scheduled appointment time. Please enter the hospital off of [address] at [employer].    [phone].

## 2012-11-29 NOTE — Progress Notes (Addendum)
NEUROLOGY CONSULTATION NOTE  [name] MRN: [id number] DOB: [birth year]  Referring physician: Dr. Lovell Sheehan Primary care physician: Dr. Lovell Sheehan  Reason for consult:  Memory problems  HISTORY OF PRESENT ILLNESS: Julia [name] is a 78 y.o. female presents with memory problems. She is accompanied by her two daughters.  Onset of symptoms started approximately 4 years ago. At first, she began misplacing things such as her keys.  She would often repeat questions. She is particularly having short-term memory problems. Sometimes when she would go fixed her husband to drink, she would forget what she was making.  She has a long-standing history of anxiety. Approximately 2 years ago, her younger brother unexpectedly passed away. This traumatic event, as well as managing his affairs, causes a great deal of increased stress and anxiety. Since that time, her personality and memory has been further altered. For example, it it was noted that she didn't seem anxious about things that she normally would be anxious about. A couple of months ago, she experienced chest pain and called her daughters. She was instructed to call 911 but instead she drove to her family's house. She was discharged from the emergency room after it was determined that she wasn't having an MI, and she followed up with a physician a couple of days later.  She occasionally is confused but doesn't really become disoriented when driving. She rarely drives and only usually drives to familiar places such as the market or the hair salon. Usually she is accompanied by her husband, or her husband drives. Her husband has several medical problems but he is very helpful around the house. They help each other out. She makes sure that he doesn't miss any appointments. He will often do the cooking and go shopping.the home is kept clean, but not quite as clean as it used to be. She does mind her hygiene.  She is tearful at times. She was initially started  on an SSRI but stopped after a couple of months. She does have an aversion to taking medications. She was subsequently started on Aricept but discontinued it after experiencing nausea and vomiting. She has not had any delusions or hallucinations. Overall her sleeping habits are good. Occasionally, if she is anxious, she will take a Xanax.  All her life, she loved to sew.  However, she stopped sewing after she increasingly had difficulty using the sewing machine.  She is very anxious and tearful about the possibility of losing her independence.  About 7 years ago, she was involved in a study where she had an MRI of her brain. She was told that the images revealed "amyloid plaques" or "scabs" with microhemorrhages.  This was performed by Dr. Avie Echevaria. These notes are not available to me.   PAST MEDICAL HISTORY: Past Medical History  Diagnosis Date  . GERD (gastroesophageal reflux disease)   . Hypertension   . Hyperlipidemia   . Osteopenia   . History of colonic polyps   . Right forearm fracture 2011  . Anxiety   . Thyroid disease     PAST SURGICAL HISTORY: Past Surgical History  Procedure Laterality Date  . Rotator cuff repair      right shoulder  . Bladder tack    . Tonsillectomy    . Bilateral salpingoophorectomy    . Surgical repair to fx left wrist    . Abdominal hysterectomy  2009    MEDICATIONS: Current Outpatient Prescriptions on File Prior to Visit  Medication Sig Dispense Refill  .  acetaminophen (TYLENOL) 500 MG tablet Take 500 mg by mouth at bedtime as needed for pain.      Marland Kitchen ALPRAZolam (XANAX) 0.25 MG tablet TAKE ONE TABLET BY MOUTH THREE TIMES DAILY AS NEEDED  60 tablet  3  . aspirin 81 MG tablet Take 81 mg by mouth daily.      . Calcium Carbonate-Vitamin D (CALCIUM-VITAMIN D) 500-200 MG-UNIT per tablet Take 1 tablet by mouth 2 (two) times daily with a meal.      . famotidine (PEPCID) 20 MG tablet Take 20 mg by mouth 2 (two) times daily.      . Ibuprofen 200 MG CAPS  Take 200 mg by mouth 2 (two) times daily.       Marland Kitchen imipramine (TOFRANIL) 50 MG tablet Take 50-100 mg by mouth 2 (two) times daily. 50 mg at noon and 100 mg at bedtime      . levothyroxine (SYNTHROID, LEVOTHROID) 88 MCG tablet TAKE ONE TABLET BY MOUTH EVERY DAY ON EMPTY STOMACH  90 tablet  3  . nadolol (CORGARD) 40 MG tablet Take 40 mg by mouth daily.      . nadolol (CORGARD) 40 MG tablet Take 1 tablet (40 mg total) by mouth daily.  30 tablet  11  . rosuvastatin (CRESTOR) 20 MG tablet Take 1 tablet (20 mg total) by mouth once a week.  90 tablet  3  . donepezil (ARICEPT) 5 MG tablet Take 1 tablet (5 mg total) by mouth at bedtime as needed.  30 tablet  4  . rosuvastatin (CRESTOR) 20 MG tablet Take 1 tablet (20 mg total) by mouth once a week.  30 tablet  11   No current facility-administered medications on file prior to visit.    ALLERGIES: Allergies  Allergen Reactions  . Entex Other (See Comments)    unknown  . Sulfonamide Derivatives Other (See Comments)    unknown    FAMILY HISTORY: Family History  Problem Relation Age of Onset  . Lung cancer    . Hypertension Mother   . COPD Mother   . Cancer Father   . Stomach cancer Father   . Heart disease Brother   . Alzheimer's disease Maternal Aunt   . Drug abuse Maternal Uncle   . Cancer Maternal Uncle   . Colon cancer Neg Hx   . Esophageal cancer Neg Hx   . Rectal cancer Neg Hx     SOCIAL HISTORY: History   Social History  . Marital Status: Married    Spouse Name: N/A    Number of Children: N/A  . Years of Education: N/A   Occupational History  . retired    Social History Main Topics  . Smoking status: Never Smoker   . Smokeless tobacco: Never Used  . Alcohol Use: No  . Drug Use: No  . Sexually Active: Not on file   Other Topics Concern  . Not on file   Social History Narrative  . No narrative on file    PHYSICAL EXAM: Filed Vitals:   11/29/12 1243  BP: 138/70  Pulse: 52  Temp: 97.6 F (36.4 C)    General: No acute distress Head:  Normocephalic/atraumatic Neck: supple, no paraspinal tenderness, full range of motion Back: No paraspinal tenderness Heart: regular rate and rhythm Lungs: Clear to auscultation bilaterally. Neurological Exam: Mental status: alert and oriented to person, place, time and self, speech fluent and not dysarthric, language intact.visual spatial and executive functioning intact, although she was somewhat hesitant. She did  have difficulty repeating a list of digits in the backward order. Otherwise, attention was pretty good. She had difficulty with some abstraction. She was only able to recall 2/5 words with delayed recall. Bontril cognitive assessment score was 26/30. Cranial nerves: CN I: not tested CN II: pupils equal, round and reactive to light, visual fields intact, fundi unremarkable CN III, IV, VI:  full range of motion, no nystagmus, no ptosis CN V: facial sensation intact CN VII: upper and lower face symmetric CN VIII: hearing intact CN IX, X: gag intact, uvula midline CN XI: sternocleidomastoid and trapezius muscles intact CN XII: tongue midline Bulk & Tone: normal, no fasciculations. Muscle strength: 5/5 throughout Sensation: pinprick and vibration intact Deep Tendon Reflexes: 2+ throughout, toes downgoing Finger to nose testing: normal without dysmetria Gait: maintains balance, normal stride, able to turn around and appropriate number of steps,. Romberg negative.  IMPRESSION & PLAN: LUCREZIA DEHNE is a 78 y.o. female with memory problems. She may very well have amnestic type of cognitive impairment. Some of this though may be complicated by her anxiety and depression, which has been a problem for many years. At this point, she is not interested in starting a medication at this time. 1.  We will get an MRI of the brain with to look for any structural pathology to suggest a cause for her cognitive deficits.  We will try to obtain the records  regarding her prior MRI.  2.  I also recommend neuropsychological testing.  At this point, she does not feel she can tolerate such a test.  However, since she has such an aversion to taking medications, I think it would be helpful to sort out whether she has actual cogntive impairment to suggest Alzheimer's or that this is manifestation of her severe anxiety.  We will wait for the results of the MRI first. 3.  Check B12 and methylmalonic acid level. 4.  Restrict driving to only daylight hours, only to few familiar places, and only with someone else in the car. 5.  Consider referral for therapy with a psychologist or social worker. 6.  Follow up in 3 months.  60 minutes spent with patient and her family, over 50% spent counseling and coordinating care.  Thank you for allowing me to take part in the care of this patient.  Shon Millet, DO  CC: Darryll Capers, MD  ADDENDUM: Received MRI Brain w/wo gad report from 04/03/98 (for episodic vertigo).  Images are not available to me but report notes: 1.  Atrophy and small vessel disease. 2 no acute intracranial abnormality is demonstrated. 3 mild changes of pontine small vessel disease are seen; this might be correlated with history of vertigo, but does not necessarily correlate with an acute event.  Shon Millet, DO

## 2012-12-04 ENCOUNTER — Ambulatory Visit (HOSPITAL_COMMUNITY)
Admission: RE | Admit: 2012-12-04 | Discharge: 2012-12-04 | Disposition: A | Discharge status: Home / Self Care | Payer: Medicare Other | Source: Ambulatory Visit | Attending: Neurology | Admitting: Neurology

## 2012-12-04 DIAGNOSIS — G319 Degenerative disease of nervous system, unspecified: Secondary | ICD-10-CM | POA: Insufficient documentation

## 2012-12-04 DIAGNOSIS — Z8673 Personal history of transient ischemic attack (TIA), and cerebral infarction without residual deficits: Secondary | ICD-10-CM | POA: Insufficient documentation

## 2012-12-04 DIAGNOSIS — R413 Other amnesia: Secondary | ICD-10-CM | POA: Insufficient documentation

## 2012-12-04 DIAGNOSIS — I6789 Other cerebrovascular disease: Secondary | ICD-10-CM | POA: Insufficient documentation

## 2012-12-05 ENCOUNTER — Telehealth: Payer: Self-pay | Admitting: Neurology

## 2012-12-05 NOTE — Telephone Encounter (Signed)
Dr. Everlena Cooper, this patient's MRI done on 07/03 appears to be mostly normal for her age. Please review and advise. Thank you.

## 2012-12-05 NOTE — Telephone Encounter (Signed)
Anxious to get MRI results. Please call ASAP. Julia [name]   Pt is anxious about getting "bad" news over the phone

## 2012-12-05 NOTE — Telephone Encounter (Signed)
Called and spoke with the patient. Information given as per Dr. Everlena Cooper below. The patient did not wish to pursue the neuropsych testing at this time. She was reminded of her f/u appointment in October as well. Advised to call if questions. She states she will.

## 2012-12-05 NOTE — Telephone Encounter (Signed)
Message copied by Benay Spice on Wed Dec 05, 2012 12:51 PM ------      Message from: JAFFE, ADAM R      Created: Tue Dec 04, 2012  4:54 PM       Please let patient know that MRI reveals no abnormalities to suggest a progressive cognitive impairment.  It does show evidence of old tiny strokes which may also contribute to memory problems.  She should continue secondary stroke prevention, such as her ASA and BP control.  Neuropsych testing is still an option, if she chooses. ------

## 2012-12-18 ENCOUNTER — Encounter: Payer: Self-pay | Admitting: Internal Medicine

## 2012-12-19 ENCOUNTER — Ambulatory Visit (INDEPENDENT_AMBULATORY_CARE_PROVIDER_SITE_OTHER): Payer: Medicare Other | Admitting: Neurology

## 2012-12-19 ENCOUNTER — Encounter: Payer: Self-pay | Admitting: Neurology

## 2012-12-19 VITALS — BP 154/80 | HR 58 | Temp 97.6°F | Wt 122.0 lb

## 2012-12-19 DIAGNOSIS — R413 Other amnesia: Secondary | ICD-10-CM

## 2012-12-19 MED ORDER — SERTRALINE HCL 25 MG PO TABS: 25.0000 mg | ORAL_TABLET | Freq: Every day | ORAL | Status: DC

## 2012-12-19 NOTE — Patient Instructions (Addendum)
1.  We will start Zoloft 25mg  daily to treat depression and anxiety.  If tolerating, call in one month and we can increase to 50mg  daily. 2.  Refer to behavioral medicine for counseling. 3.  Refer to neuropsychological testing. 4.  Follow up in 3 months.  We will refer you to Dr. Leonides Cave for memory testing as recommended by Dr. Modesto Charon. His office is located at [facility] [address] in [place]. Dr. Leonides Cave will call you to set up this appointment.   [phone].  We will refer you to Dr. Dellia Cloud for counseling. They will contact you to schedule your appointment.    [phone]

## 2012-12-19 NOTE — Progress Notes (Signed)
NEUROLOGY FOLLOW UP OFFICE NOTE  Julia [name] [id number]  HISTORY OF PRESENT ILLNESS: Julia [name] is a 78 y.o. female presents for follow up with her daughters regarding memory problems.  Her daughter requested a follow-up appointment to discuss treatment options.  Onset of symptoms began 4 years ago, starting with misplacing things such as her keys, and would often repeat questions. She is particularly having short-term memory problems. She has a long-standing history of anxiety. Approximately 2 years ago, her younger brother unexpectedly passed away. This traumatic event, as well as managing his affairs, causes a great deal of increased stress and anxiety. Since that time, her personality and memory has been further altered. She occasionally is confused but doesn't really become disoriented when driving. She rarely drives and only usually drives to familiar places such as the market or the hair salon. Usually she is accompanied by her husband, or her husband drives. Her husband has several medical problems but he is very helpful around the house. They help each other out. She makes sure that he doesn't miss any appointments. He will often do the cooking and go shopping.the home is kept clean, but not quite as clean as it used to be. She does mind her hygiene. She is tearful at times. She was initially started on an SSRI but stopped after a couple of months. She does have an aversion to taking medications. She was subsequently started on Aricept but discontinued it after experiencing nausea and vomiting. She has not had any delusions or hallucinations. Overall her sleeping habits are good. Occasionally, if she is anxious, she will take a Xanax. She has difficulty performing activities that she has long enjoyed, such as sewing.  She has tried low dose Aricept which made her sick.  MRI of the brain from 12/04/12 was reviewed and revealed mild to moderate atrophy with moderately extensive small  vessel ischemic changes and small chronic lacunar infarct.  B12 level was 301.  Last visit, I suggested formal neuropsychological testing, but she wanted to forego this for now.  She was also not interested in starting a medication at that time.  MMSE score was 26/30. PAST MEDICAL HISTORY: Past Medical History  Diagnosis Date  . GERD (gastroesophageal reflux disease)   . Hypertension   . Hyperlipidemia   . Osteopenia   . History of colonic polyps   . Right forearm fracture 2011  . Anxiety   . Thyroid disease     MEDICATIONS: Current Outpatient Prescriptions on File Prior to Visit  Medication Sig Dispense Refill  . acetaminophen (TYLENOL) 500 MG tablet Take 500 mg by mouth at bedtime as needed for pain.      Marland Kitchen ALPRAZolam (XANAX) 0.25 MG tablet TAKE ONE TABLET BY MOUTH THREE TIMES DAILY AS NEEDED  60 tablet  3  . aspirin 81 MG tablet Take 81 mg by mouth daily.      . Calcium Carbonate-Vitamin D (CALCIUM-VITAMIN D) 500-200 MG-UNIT per tablet Take 1 tablet by mouth 2 (two) times daily with a meal.      . famotidine (PEPCID) 20 MG tablet Take 20 mg by mouth 2 (two) times daily.      . Ibuprofen 200 MG CAPS Take 200 mg by mouth 2 (two) times daily.       Marland Kitchen imipramine (TOFRANIL) 50 MG tablet Take 50-100 mg by mouth 2 (two) times daily. 50 mg at noon and 100 mg at bedtime      . levothyroxine (SYNTHROID, LEVOTHROID) 88 MCG  tablet TAKE ONE TABLET BY MOUTH EVERY DAY ON EMPTY STOMACH  90 tablet  3  . nadolol (CORGARD) 40 MG tablet Take 40 mg by mouth daily.      . nadolol (CORGARD) 40 MG tablet Take 1 tablet (40 mg total) by mouth daily.  30 tablet  11  . rosuvastatin (CRESTOR) 20 MG tablet Take 1 tablet (20 mg total) by mouth once a week.  90 tablet  3  . rosuvastatin (CRESTOR) 20 MG tablet Take 1 tablet (20 mg total) by mouth once a week.  30 tablet  11   No current facility-administered medications on file prior to visit.    ALLERGIES: Allergies  Allergen Reactions  . Entex Other (See  Comments)    unknown  . Sulfonamide Derivatives Other (See Comments)    unknown    FAMILY HISTORY: Family History  Problem Relation Age of Onset  . Lung cancer    . Hypertension Mother   . COPD Mother   . Cancer Father   . Stomach cancer Father   . Heart disease Brother   . Alzheimer's disease Maternal Aunt   . Drug abuse Maternal Uncle   . Cancer Maternal Uncle   . Colon cancer Neg Hx   . Esophageal cancer Neg Hx   . Rectal cancer Neg Hx     SOCIAL HISTORY: History   Social History  . Marital Status: Married    Spouse Name: N/A    Number of Children: N/A  . Years of Education: N/A   Occupational History  . retired    Social History Main Topics  . Smoking status: Never Smoker   . Smokeless tobacco: Never Used  . Alcohol Use: No  . Drug Use: No  . Sexually Active: Not on file   Other Topics Concern  . Not on file   Social History Narrative  . No narrative on file    PHYSICAL EXAM: Filed Vitals:   12/19/12 1453  BP: 154/80  Pulse: 58  Temp: 97.6 F (36.4 C)   General: No acute distress Head:  Normocephalic/atraumatic Neck: supple, no paraspinal tenderness, full range of motion Formal neurologic exam not performed this visit.  IMPRESSION & PLAN: Julia [name] is a 78 y.o. female with amnestic cognitive impairment, possibly complicated by depression and anxiety.  Due to small vessel disease seen on MRI, vascular etiology possible but less likely given the slowly progressive nature of symptoms.  I discussed in length my opinion and options on how to go forward.  I do recommend neuropsych testing to help gear proper treatment (use of cholinesterase inhibitors).  We can try another cholinesterase inhibitor such as Exelon and see if she tolerates it.  Regardless, I think she needs treatment for depression, anxiety and bereavement as well.  1.  Refer again to neuropsychological testing.  She is in agreement this time. 2.  Regarding depression/anxiety, will  restart sertraline 25mg  daily since she tolerated it in the past.  In a month, we can increase to 50mg  daily if needed. 3.  I will also refer to behavioral medicine and counseling, specifically dealing with anxiety and bereavement. 4.  We will forego trying another cholinesterase inhibitor at this time, since we are starting sertraline anyway and I do not want to start two medications at the same time.  We will see what the neuropsych testing reveals. 5.  Continue ASA for secondary stroke prevention. 6.  Follow up in 3 months.  30 minutes spent with patient  and her daughters, 100% spent reviewing the MRI images with patient, explaining potential etiologies, counseling and coordinating care.  I answered all questions to the best of my ability.  Shon Millet, DO  CC: Darryll Capers, MD

## 2012-12-26 ENCOUNTER — Ambulatory Visit (INDEPENDENT_AMBULATORY_CARE_PROVIDER_SITE_OTHER): Payer: 59 | Admitting: Licensed Clinical Social Worker

## 2012-12-26 DIAGNOSIS — F4323 Adjustment disorder with mixed anxiety and depressed mood: Secondary | ICD-10-CM

## 2013-01-09 ENCOUNTER — Ambulatory Visit: Payer: Medicare Other | Admitting: Licensed Clinical Social Worker

## 2013-01-23 ENCOUNTER — Other Ambulatory Visit: Payer: Self-pay | Admitting: Neurology

## 2013-01-23 ENCOUNTER — Telehealth: Payer: Self-pay | Admitting: Neurology

## 2013-01-23 MED ORDER — SERTRALINE HCL 50 MG PO TABS: ORAL_TABLET | ORAL | Status: DC

## 2013-01-23 MED ORDER — SERTRALINE HCL 25 MG PO TABS: ORAL_TABLET | ORAL | Status: DC

## 2013-01-23 NOTE — Telephone Encounter (Signed)
Yes, we can increase Zoloft to 50mg  daily.

## 2013-01-23 NOTE — Telephone Encounter (Signed)
Done

## 2013-01-23 NOTE — Telephone Encounter (Signed)
Called and spoke with the patient's daughter, Julia [name]. I let her know that her mom had called the office yesterday requesting a refill on her Zoloft. After reading Dr. Moises Blood last office note, there was a reference to increasing the dose of the Zoloft if needed after one month. I called Julia [name] to ask how she was doing. She thinks it would be beneficial to increase the medication. States she is still in denial about needing counseling. Had one session and then said she didn't need to go back. She does have an appointment with Dr. Leonides Cave this Friday for the neuropsych testing. I told her that I would check with Dr. Everlena Cooper and then send the script in electronically. Julia [name] was in agreement with this plan. **Dr. Everlena Cooper, ok to increase the Zoloft to 50 mg po daily? She has a f/u with you in October.

## 2013-01-25 ENCOUNTER — Ambulatory Visit: Payer: Medicare Other | Admitting: Internal Medicine

## 2013-01-25 DIAGNOSIS — R413 Other amnesia: Secondary | ICD-10-CM

## 2013-02-01 DIAGNOSIS — R413 Other amnesia: Secondary | ICD-10-CM

## 2013-03-06 ENCOUNTER — Ambulatory Visit: Payer: Medicare Other | Admitting: Neurology

## 2013-03-22 ENCOUNTER — Encounter: Payer: Self-pay | Admitting: Neurology

## 2013-03-22 ENCOUNTER — Ambulatory Visit (INDEPENDENT_AMBULATORY_CARE_PROVIDER_SITE_OTHER): Payer: Medicare Other | Admitting: Neurology

## 2013-03-22 VITALS — BP 116/50 | HR 68 | Temp 97.5°F | Ht 63.0 in | Wt 123.0 lb

## 2013-03-22 DIAGNOSIS — R413 Other amnesia: Secondary | ICD-10-CM

## 2013-03-22 DIAGNOSIS — F329 Major depressive disorder, single episode, unspecified: Secondary | ICD-10-CM

## 2013-03-22 DIAGNOSIS — G3184 Mild cognitive impairment, so stated: Secondary | ICD-10-CM

## 2013-03-22 MED ORDER — GALANTAMINE HYDROBROMIDE ER 8 MG PO CP24: 8.0000 mg | ORAL_CAPSULE | Freq: Every day | ORAL | Status: DC

## 2013-03-22 NOTE — Patient Instructions (Addendum)
You do have mild cognitive impairment, which puts you at a higher risk for Alzheimer's disease. 1.  We will start galantamine ER 8mg  every morning with breakfast. We will start donepezil (Aricept) 5mg  daily for four weeks.  Call in 4 weeks and we can increase the dose if tolerated.  Side effects include nausea, vomiting, diarrhea, vivid dreams, and muscle cramps.  Please call the clinic if you experience any of these symptoms.  2.  Continue daily aspirin and cholesterol medication. 3.  Continue reading. 4.  We will provide numbers for support groups in the area. 5.  FOllow up in 6 months or as needed.

## 2013-03-22 NOTE — Progress Notes (Signed)
NEUROLOGY FOLLOW UP OFFICE NOTE  Julia [name] [id number]  HISTORY OF PRESENT ILLNESS: Julia [name] is a 78 year old woman with HTN, hyperlipidemia, anxiety and thyroid disease, who returns for memory problems.  She is accompanied by her daughters. .  Records and images were personally reviewed where available.    Onset of symptoms began 4 years ago, starting with misplacing things such as her keys, and would often repeat questions. She is particularly having short-term memory problems. She has a long-standing history of anxiety. Approximately 2 years ago, her younger brother unexpectedly passed away. This traumatic event, as well as managing his affairs, causes a great deal of increased stress and anxiety. Since that time, her personality and memory has been further altered. She occasionally is confused but doesn't really become disoriented when driving. She rarely drives and only usually drives to familiar places such as the market or the hair salon. Usually she is accompanied by her husband, or her husband drives. Her husband has several medical problems but he is very helpful around the house. They help each other out. She makes sure that he doesn't miss any appointments. He will often do the cooking and go shopping.the home is kept clean, but not quite as clean as it used to be. She does mind her hygiene. She is tearful at times. She was initially started on an SSRI but stopped after a couple of months. She does have an aversion to taking medications. She was subsequently started on Aricept but discontinued it after experiencing nausea and vomiting. She has not had any delusions or hallucinations. Overall her sleeping habits are good. Occasionally, if she is anxious, she will take a Xanax. She has difficulty performing activities that she has long enjoyed, such as sewing.  She has tried low dose Aricept which made her sick.  MRI of the brain from 12/04/12 was reviewed and revealed mild to  moderate atrophy with moderately extensive small vessel ischemic changes and small chronic lacunar infarct.  B12 level was 301.  MMSE score from 11/29/12 was 26/30.  She underwent neuropsychological testing, which did reveal mild cognitive impairment, somewhat complicated by periods of depression and anxiety.  Since last visit, she is doing well.  She feels better from an emotional standpoint.  She has been doing better since increase in sertraline.  She is active and reads.  She is performing her ADLs.  PAST MEDICAL HISTORY: Past Medical History  Diagnosis Date  . GERD (gastroesophageal reflux disease)   . Hypertension   . Hyperlipidemia   . Osteopenia   . History of colonic polyps   . Right forearm fracture 2011  . Anxiety   . Thyroid disease     MEDICATIONS: Current Outpatient Prescriptions on File Prior to Visit  Medication Sig Dispense Refill  . acetaminophen (TYLENOL) 500 MG tablet Take 500 mg by mouth at bedtime as needed for pain.      Marland Kitchen ALPRAZolam (XANAX) 0.25 MG tablet TAKE ONE TABLET BY MOUTH THREE TIMES DAILY AS NEEDED  60 tablet  3  . aspirin 81 MG tablet Take 81 mg by mouth daily.      . Calcium Carbonate-Vitamin D (CALCIUM-VITAMIN D) 500-200 MG-UNIT per tablet Take 1 tablet by mouth 2 (two) times daily with a meal.      . famotidine (PEPCID) 20 MG tablet Take 20 mg by mouth 2 (two) times daily.      . Ibuprofen 200 MG CAPS Take 200 mg by mouth 2 (two) times daily.       Marland Kitchen  imipramine (TOFRANIL) 50 MG tablet Take 50-100 mg by mouth 2 (two) times daily. 50 mg at noon and 100 mg at bedtime      . levothyroxine (SYNTHROID, LEVOTHROID) 88 MCG tablet TAKE ONE TABLET BY MOUTH EVERY DAY ON EMPTY STOMACH  90 tablet  3  . nadolol (CORGARD) 40 MG tablet Take 1 tablet (40 mg total) by mouth daily.  30 tablet  11  . rosuvastatin (CRESTOR) 20 MG tablet Take 1 tablet (20 mg total) by mouth once a week.  90 tablet  3  . sertraline (ZOLOFT) 50 MG tablet Take one tablet (50 mg total) by  mouth daily.  30 tablet  2  . rosuvastatin (CRESTOR) 20 MG tablet Take 1 tablet (20 mg total) by mouth once a week.  30 tablet  11   No current facility-administered medications on file prior to visit.    ALLERGIES: Allergies  Allergen Reactions  . Entex Other (See Comments)    unknown  . Sulfonamide Derivatives Other (See Comments)    unknown    FAMILY HISTORY: Family History  Problem Relation Age of Onset  . Lung cancer    . Hypertension Mother   . COPD Mother   . Cancer Father   . Stomach cancer Father   . Heart disease Brother   . Alzheimer's disease Maternal Aunt   . Drug abuse Maternal Uncle   . Cancer Maternal Uncle   . Colon cancer Neg Hx   . Esophageal cancer Neg Hx   . Rectal cancer Neg Hx     SOCIAL HISTORY: History   Social History  . Marital Status: Married    Spouse Name: N/A    Number of Children: N/A  . Years of Education: N/A   Occupational History  . retired    Social History Main Topics  . Smoking status: Never Smoker   . Smokeless tobacco: Never Used  . Alcohol Use: No  . Drug Use: No  . Sexual Activity: Not on file   Other Topics Concern  . Not on file   Social History Narrative  . No narrative on file    REVIEW OF SYSTEMS: Constitutional: No fevers, chills, or sweats, no generalized fatigue, change in appetite Eyes: No visual changes, double vision, eye pain Ear, nose and throat: No hearing loss, ear pain, nasal congestion, sore throat Cardiovascular: No chest pain, palpitations Respiratory:  No shortness of breath at rest or with exertion, wheezes GastrointestinaI: No nausea, vomiting, diarrhea, abdominal pain, fecal incontinence Genitourinary:  No dysuria, urinary retention or frequency Musculoskeletal:  No neck pain, back pain Integumentary: No rash, pruritus, skin lesions Neurological: as above Psychiatric: No depression, insomnia, anxiety Endocrine: No palpitations, fatigue, diaphoresis, mood swings, change in appetite,  change in weight, increased thirst Hematologic/Lymphatic:  No anemia, purpura, petechiae. Allergic/Immunologic: no itchy/runny eyes, nasal congestion, recent allergic reactions, rashes  PHYSICAL EXAM: Filed Vitals:   03/22/13 1504  BP: 116/50  Pulse: 68  Temp: 97.5 F (36.4 C)   General: No acute distress Head:  Normocephalic/atraumatic Neck: supple, no paraspinal tenderness, full range of motion Heart:  Regular rate and rhythm Lungs:  Clear to auscultation bilaterally Back: No paraspinal tenderness Neurological Exam: alert and oriented to person, place, and time. Speech fluent and not dysarthric, able to name, repeat, write, read and follow 3-step command across midline.  Recalled 1 out of 3 words after a couple of minutes.  Able to spell WORLD backwards.  Drew intersecting pentagons mildly incorrect.  MMSE 27/30.  CN II-XII intact. Fundoscopic exam unremarkable, no papilledema.  Bulk and tone normal, muscle strength 5/5 throughout.  Sensation to light touch intact.  Deep tendon reflexes 2+ throughout except absent at ankles.  Finger to nose testing intact.  Gait normal.  IMPRESSION: 1.  Amnestic Mild Cognitive Impairment. 2.  Depression and bereavement  PLAN:  1.  Recommend starting a cholinesterase inhibitor.  She did not tolerate Aricept in the past.  We will start galantamine 8mg  qAM.  Call in one month for increase if tolerated. 2.  Continue ASA therapy for secondary stroke prevention.   3.  Continue statin with LDL goal <100. 4.  Continue sertraline. 5.  Continue reading and remaining active.  Exercise.  Eat well. 6.  Provided numbers and info for Alzheimer's support groups. 7.  Follow up in 6 months.  45 minute spent with patient and daughters, over 50% spent counseling and coordinating care.  Shon Millet, DO  CC:  Darryll Capers, MD

## 2013-04-03 ENCOUNTER — Ambulatory Visit (INDEPENDENT_AMBULATORY_CARE_PROVIDER_SITE_OTHER): Payer: Medicare Other | Admitting: Internal Medicine

## 2013-04-03 ENCOUNTER — Encounter: Payer: Self-pay | Admitting: Internal Medicine

## 2013-04-03 VITALS — BP 130/72 | HR 72 | Temp 97.7°F | Resp 16 | Ht 63.0 in | Wt 122.0 lb

## 2013-04-03 DIAGNOSIS — R4189 Other symptoms and signs involving cognitive functions and awareness: Secondary | ICD-10-CM

## 2013-04-03 DIAGNOSIS — E785 Hyperlipidemia, unspecified: Secondary | ICD-10-CM

## 2013-04-03 DIAGNOSIS — I10 Essential (primary) hypertension: Secondary | ICD-10-CM

## 2013-04-03 DIAGNOSIS — R419 Unspecified symptoms and signs involving cognitive functions and awareness: Secondary | ICD-10-CM

## 2013-04-03 LAB — BASIC METABOLIC PANEL
CO2: 31 mEq/L (ref 19–32)
Chloride: 101 mEq/L (ref 96–112)
Glucose, Bld: 101 mg/dL — ABNORMAL HIGH (ref 70–99)
Potassium: 5 mEq/L (ref 3.5–5.1)
Sodium: 137 mEq/L (ref 135–145)

## 2013-04-03 LAB — HEPATIC FUNCTION PANEL
AST: 28 U/L (ref 0–37)
Albumin: 4.1 g/dL (ref 3.5–5.2)
Alkaline Phosphatase: 46 U/L (ref 39–117)
Total Protein: 6.7 g/dL (ref 6.0–8.3)

## 2013-04-03 MED ORDER — SERTRALINE HCL 50 MG PO TABS: ORAL_TABLET | ORAL | Status: DC

## 2013-04-03 NOTE — Patient Instructions (Signed)
Take the crestor every week

## 2013-04-03 NOTE — Progress Notes (Signed)
Subjective:    Patient ID: Julia [name], female    DOB: Feb 10, 1935, 78 y.o.   MRN: [id number]  HPI Follow up for hypothyroidism, hypertension depression Memory loss see neurology and had neuropsychtesting Lipid management  Review of Systems  Constitutional: Negative for activity change, appetite change and fatigue.  HENT: Negative for congestion, ear pain, postnasal drip and sinus pressure.   Eyes: Negative for redness and visual disturbance.  Respiratory: Positive for shortness of breath. Negative for cough and wheezing.   Gastrointestinal: Negative for abdominal pain and abdominal distention.  Genitourinary: Negative for dysuria, frequency and menstrual problem.  Musculoskeletal: Negative for arthralgias, joint swelling, myalgias and neck pain.  Skin: Negative for rash and wound.  Neurological: Negative for dizziness, weakness and headaches.  Hematological: Negative for adenopathy. Does not bruise/bleed easily.  Psychiatric/Behavioral: Positive for behavioral problems. Negative for sleep disturbance and decreased concentration.       Anxiety   Past Medical History  Diagnosis Date  . GERD (gastroesophageal reflux disease)   . Hypertension   . Hyperlipidemia   . Osteopenia   . History of colonic polyps   . Right forearm fracture 2011  . Anxiety   . Thyroid disease     History   Social History  . Marital Status: Married    Spouse Name: N/A    Number of Children: N/A  . Years of Education: N/A   Occupational History  . retired    Social History Main Topics  . Smoking status: Never Smoker   . Smokeless tobacco: Never Used  . Alcohol Use: No  . Drug Use: No  . Sexual Activity: Not on file   Other Topics Concern  . Not on file   Social History Narrative  . No narrative on file    Past Surgical History  Procedure Laterality Date  . Rotator cuff repair      right shoulder  . Bladder tack    . Tonsillectomy    . Bilateral salpingoophorectomy    .  Surgical repair to fx left wrist    . Abdominal hysterectomy  2009    Family History  Problem Relation Age of Onset  . Lung cancer    . Hypertension Mother   . COPD Mother   . Cancer Father   . Stomach cancer Father   . Heart disease Brother   . Alzheimer's disease Maternal Aunt   . Drug abuse Maternal Uncle   . Cancer Maternal Uncle   . Colon cancer Neg Hx   . Esophageal cancer Neg Hx   . Rectal cancer Neg Hx     Allergies  Allergen Reactions  . Entex Other (See Comments)    unknown  . Sulfonamide Derivatives Other (See Comments)    unknown    Current Outpatient Prescriptions on File Prior to Visit  Medication Sig Dispense Refill  . acetaminophen (TYLENOL) 500 MG tablet Take 500 mg by mouth at bedtime as needed for pain.      Marland Kitchen ALPRAZolam (XANAX) 0.25 MG tablet TAKE ONE TABLET BY MOUTH THREE TIMES DAILY AS NEEDED  60 tablet  3  . aspirin 81 MG tablet Take 162 mg by mouth daily.       . Calcium Carbonate-Vitamin D (CALCIUM-VITAMIN D) 500-200 MG-UNIT per tablet Take 1 tablet by mouth 2 (two) times daily with a meal.      . famotidine (PEPCID) 20 MG tablet Take 20 mg by mouth 2 (two) times daily.      Marland Kitchen  galantamine (RAZADYNE ER) 8 MG 24 hr capsule Take 1 capsule (8 mg total) by mouth daily with breakfast.  30 capsule  0  . Ibuprofen 200 MG CAPS Take 200 mg by mouth 2 (two) times daily.       Marland Kitchen imipramine (TOFRANIL) 50 MG tablet Take 50-100 mg by mouth 2 (two) times daily. 50 mg at noon and 100 mg at bedtime      . levothyroxine (SYNTHROID, LEVOTHROID) 88 MCG tablet TAKE ONE TABLET BY MOUTH EVERY DAY ON EMPTY STOMACH  90 tablet  3  . nadolol (CORGARD) 40 MG tablet Take 1 tablet (40 mg total) by mouth daily.  30 tablet  11  . rosuvastatin (CRESTOR) 20 MG tablet Take 1 tablet (20 mg total) by mouth once a week.  90 tablet  3   No current facility-administered medications on file prior to visit.    BP 130/72  Pulse 72  Temp(Src) 97.7 F (36.5 C)  Resp 16  Ht 5\' 3"  (1.6 m)   Wt 122 lb (55.339 kg)  BMI 21.62 kg/m2       Objective:   Physical Exam  Constitutional: She is oriented to person, place, and time. She appears well-developed and well-nourished. No distress.  HENT:  Head: Normocephalic and atraumatic.  Eyes: Conjunctivae and EOM are normal. Pupils are equal, round, and reactive to light.  Neck: Normal range of motion. Neck supple. No JVD present. No tracheal deviation present. No thyromegaly present.  Cardiovascular: Normal rate, regular rhythm and intact distal pulses.   Murmur heard. Pulmonary/Chest: Effort normal and breath sounds normal. She has no wheezes. She exhibits no tenderness.  Abdominal: Soft. Bowel sounds are normal.  Musculoskeletal: Normal range of motion. She exhibits no edema and no tenderness.  Lymphadenopathy:    She has no cervical adenopathy.  Neurological: She is alert and oriented to person, place, and time. She has normal reflexes. No cranial nerve deficit.  Skin: Skin is warm and dry. She is not diaphoretic.  Psychiatric: She has a normal mood and affect. Her behavior is normal.          Assessment & Plan:  Discussed the crestor  Monitoring labs reviewd goals for lipids Medication side effects discussed

## 2013-04-03 NOTE — Progress Notes (Signed)
  Subjective:    Patient ID: Julia [name], female    DOB: 1934/08/12, 78 y.o.   MRN: [id number]  HPI  Pre-visit discussion using our clinic review tool. No additional management support is needed unless otherwise documented below in the visit note.  Review of Systems     Objective:   Physical Exam        Assessment & Plan:

## 2013-05-03 ENCOUNTER — Other Ambulatory Visit: Payer: Self-pay | Admitting: Internal Medicine

## 2013-05-03 ENCOUNTER — Other Ambulatory Visit: Payer: Self-pay | Admitting: Neurology

## 2013-05-03 MED ORDER — GALANTAMINE HYDROBROMIDE ER 8 MG PO CP24: 8.0000 mg | ORAL_CAPSULE | Freq: Every day | ORAL | Status: DC

## 2013-05-13 ENCOUNTER — Other Ambulatory Visit: Payer: Self-pay | Admitting: *Deleted

## 2013-05-13 MED ORDER — IMIPRAMINE HCL 50 MG PO TABS: ORAL_TABLET | ORAL | Status: DC

## 2013-05-15 ENCOUNTER — Ambulatory Visit (INDEPENDENT_AMBULATORY_CARE_PROVIDER_SITE_OTHER): Payer: Medicare Other

## 2013-06-05 ENCOUNTER — Telehealth: Payer: Self-pay | Admitting: Neurology

## 2013-06-05 NOTE — Telephone Encounter (Signed)
Picked up a call from [place] who was calling for a refill on her Galantamine 8 mg. She reports that she is tolerating the medication well without side effects. I told her that Dr. Tomi Likens may wish to increase her dose since she is doing ok and we would take care of the refill request tomorrow as he is out of the office this afternoon. She is ok to wait. Pharmacy is [facility] [address]. In [place]. **Dr Tomi Likens please advise.

## 2013-06-06 ENCOUNTER — Other Ambulatory Visit: Payer: Self-pay | Admitting: Neurology

## 2013-06-06 MED ORDER — GALANTAMINE HYDROBROMIDE ER 16 MG PO CP24: 8.0000 mg | ORAL_CAPSULE | Freq: Every day | ORAL | Status: DC

## 2013-06-06 NOTE — Telephone Encounter (Signed)
Called the patient's pharmacy to verify if the med was ER and it was. E-scribed new dosing to the pharmacy and called to let the patient know that it had been done.

## 2013-06-06 NOTE — Telephone Encounter (Signed)
I thought she was on the twice daily dosing.  Then yes, increase the ER to 16mg  daily.

## 2013-06-06 NOTE — Telephone Encounter (Signed)
We can increase galantamine to 12mg  BID (12mg  tabs, #60) with 5 refills

## 2013-06-06 NOTE — Telephone Encounter (Signed)
Dr. Tomi [name], the patient was on the 8 mg ER. The next dose is the 16 mg to be taken once a day. Please advise.

## 2013-06-11 ENCOUNTER — Telehealth: Payer: Self-pay | Admitting: Internal Medicine

## 2013-06-11 ENCOUNTER — Other Ambulatory Visit: Payer: Self-pay | Admitting: Internal Medicine

## 2013-06-11 NOTE — Telephone Encounter (Signed)
Already called in number [phone], called back and gave #[phone]

## 2013-06-11 NOTE — Telephone Encounter (Signed)
Pt would like to know if you would send in a new rx  For ALPRAZolam (XANAX) 0.25 MG tablet but not for 60 tabs. Pt states she doesn't want 60, only 30 tab. Pt states she will just end up throwing them away. Will not p/u the 60 tabs if you will send in 30 only. [facility]/ [address] [place]

## 2013-06-20 ENCOUNTER — Telehealth: Payer: Self-pay | Admitting: *Deleted

## 2013-06-20 NOTE — Telephone Encounter (Signed)
Spoke with patient to let her know her rx was sent to [facility] in [place] she will pick it up . She also wanted to let Dr Tomi Likens know she really thought he was a great Doctor and to let him know her daughter passed away on 06/29/2013

## 2013-06-20 NOTE — Telephone Encounter (Signed)
Spoke with patient informed her medication was waiting on her at [facility]  RX  [place]

## 2013-07-31 ENCOUNTER — Ambulatory Visit (INDEPENDENT_AMBULATORY_CARE_PROVIDER_SITE_OTHER): Payer: Medicare Other | Admitting: Emergency Medicine

## 2013-07-31 VITALS — BP 120/72 | HR 60 | Temp 98.0°F | Resp 16 | Ht 63.0 in | Wt 121.0 lb

## 2013-07-31 DIAGNOSIS — L509 Urticaria, unspecified: Secondary | ICD-10-CM

## 2013-07-31 NOTE — Progress Notes (Signed)
   Subjective:    Patient ID: Julia [name], female    DOB: 02-Jan-1935, 79 y.o.   MRN: [id number]  HPI patient here with a rash on both legs. She states the rash does not hurt she states the rash does not itch it seems to come and go.    Review of Systems     Objective:   Physical Exam there is what appears to be a resolving area of urticaria on the top of her right thigh. There is also an area on her left leg which appears to be resolving. They do not show any signs of cellulitis        Assessment & Plan:  She will take Zyrtec 10 mg one a day and Benadryl 25 mg at bedtime

## 2013-07-31 NOTE — Patient Instructions (Signed)
   Take Zyrtec 10 mg one a day. Take Benadryl 25 mg one at bedtime.                                                                            Hives Hives are itchy, red, swollen areas of the skin. They can vary in size and location on your body. Hives can come and go for hours or several days (acute hives) or for several weeks (chronic hives). Hives do not spread from person to person (noncontagious). They may get worse with scratching, exercise, and emotional stress. CAUSES   Allergic reaction to food, additives, or drugs.  Infections, including the common cold.  Illness, such as vasculitis, lupus, or thyroid disease.  Exposure to sunlight, heat, or cold.  Exercise.  Stress.  Contact with chemicals. SYMPTOMS   Red or white swollen patches on the skin. The patches may change size, shape, and location quickly and repeatedly.  Itching.  Swelling of the hands, feet, and face. This may occur if hives develop deeper in the skin. DIAGNOSIS  Your caregiver can usually tell what is wrong by performing a physical exam. Skin or blood tests may also be done to determine the cause of your hives. In some cases, the cause cannot be determined. TREATMENT  Mild cases usually get better with medicines such as antihistamines. Severe cases may require an emergency epinephrine injection. If the cause of your hives is known, treatment includes avoiding that trigger.  HOME CARE INSTRUCTIONS   Avoid causes that trigger your hives.  Take antihistamines as directed by your caregiver to reduce the severity of your hives. Non-sedating or low-sedating antihistamines are usually recommended. Do not drive while taking an antihistamine.  Take any other medicines prescribed for itching as directed by your caregiver.  Wear loose-fitting clothing.  Keep all follow-up appointments as directed by your caregiver. SEEK MEDICAL CARE IF:   You have persistent or severe itching that is not relieved with  medicine.  You have painful or swollen joints. SEEK IMMEDIATE MEDICAL CARE IF:   You have a fever.  Your tongue or lips are swollen.  You have trouble breathing or swallowing.  You feel tightness in the throat or chest.  You have abdominal pain. These problems may be the first sign of a life-threatening allergic reaction. Call your local emergency services (911 in [place] MAKE SURE YOU:   Understand these instructions.  Will watch your condition.  Will get help right away if you are not doing well or get worse. Document Released: 05/16/2005 Document Revised: 11/15/2011 Document Reviewed: 08/09/2011 [facility] Patient Information 2014 [place].

## 2013-08-05 ENCOUNTER — Telehealth: Payer: Self-pay | Admitting: Neurology

## 2013-08-05 NOTE — Telephone Encounter (Signed)
Cell# [phone], please call daughter Neale Burly / Gayleen Orem

## 2013-08-06 NOTE — Telephone Encounter (Signed)
Julia [name] patient daughter called again and would like a call back please call [phone]

## 2013-08-07 NOTE — Telephone Encounter (Signed)
Given the circumstances, I would rather like to refer her to a psychiatrist to address the need of increasing the Zoloft or switching to another medication.

## 2013-08-07 NOTE — Telephone Encounter (Signed)
I spoke to Julia [name] patient's daughter she does not feel that the patient would want to go to a psychiatrist  If they ask her to they feel  It is more likely she will go if Dr Tomi Likens suggest it to her  Therefore they will wait until her April 9 appt and let Dr Tomi Likens discuss this with her . I advise them to call back if an appt was needed sooner

## 2013-08-07 NOTE — Telephone Encounter (Signed)
Patients daughter Marcie Bal called wanting to let Dr Tomi Likens know that her daughter committed suicide last month and thinks this patient needs a possible increase in her zoloft as well as her alzheimer's medication . The daughter feels she is really depressed due to all that is happening . She also ask to move her appt up to April which I have done . Please advise on medications

## 2013-08-26 ENCOUNTER — Emergency Department (HOSPITAL_BASED_OUTPATIENT_CLINIC_OR_DEPARTMENT_OTHER)
Admission: EM | Admit: 2013-08-26 | Discharge: 2013-08-27 | Disposition: A | Discharge status: Home / Self Care | Payer: Medicare Other | Attending: Emergency Medicine | Admitting: Emergency Medicine

## 2013-08-26 ENCOUNTER — Encounter (HOSPITAL_BASED_OUTPATIENT_CLINIC_OR_DEPARTMENT_OTHER): Payer: Self-pay | Admitting: Emergency Medicine

## 2013-08-26 ENCOUNTER — Emergency Department (HOSPITAL_BASED_OUTPATIENT_CLINIC_OR_DEPARTMENT_OTHER): Payer: Medicare Other

## 2013-08-26 DIAGNOSIS — M949 Disorder of cartilage, unspecified: Secondary | ICD-10-CM

## 2013-08-26 DIAGNOSIS — Z860100 Personal history of colon polyps, unspecified: Secondary | ICD-10-CM | POA: Insufficient documentation

## 2013-08-26 DIAGNOSIS — E785 Hyperlipidemia, unspecified: Secondary | ICD-10-CM | POA: Insufficient documentation

## 2013-08-26 DIAGNOSIS — M899 Disorder of bone, unspecified: Secondary | ICD-10-CM | POA: Insufficient documentation

## 2013-08-26 DIAGNOSIS — Z7982 Long term (current) use of aspirin: Secondary | ICD-10-CM | POA: Insufficient documentation

## 2013-08-26 DIAGNOSIS — Z79899 Other long term (current) drug therapy: Secondary | ICD-10-CM | POA: Insufficient documentation

## 2013-08-26 DIAGNOSIS — R531 Weakness: Secondary | ICD-10-CM

## 2013-08-26 DIAGNOSIS — Z8781 Personal history of (healed) traumatic fracture: Secondary | ICD-10-CM | POA: Insufficient documentation

## 2013-08-26 DIAGNOSIS — I10 Essential (primary) hypertension: Secondary | ICD-10-CM | POA: Insufficient documentation

## 2013-08-26 DIAGNOSIS — R5381 Other malaise: Secondary | ICD-10-CM | POA: Insufficient documentation

## 2013-08-26 DIAGNOSIS — Z791 Long term (current) use of non-steroidal anti-inflammatories (NSAID): Secondary | ICD-10-CM | POA: Insufficient documentation

## 2013-08-26 DIAGNOSIS — E079 Disorder of thyroid, unspecified: Secondary | ICD-10-CM | POA: Insufficient documentation

## 2013-08-26 DIAGNOSIS — K219 Gastro-esophageal reflux disease without esophagitis: Secondary | ICD-10-CM | POA: Insufficient documentation

## 2013-08-26 DIAGNOSIS — F411 Generalized anxiety disorder: Secondary | ICD-10-CM | POA: Insufficient documentation

## 2013-08-26 DIAGNOSIS — R5383 Other fatigue: Principal | ICD-10-CM

## 2013-08-26 LAB — CBC WITH DIFFERENTIAL/PLATELET
BASOS ABS: 0 10*3/uL (ref 0.0–0.1)
BASOS PCT: 1 % (ref 0–1)
EOS PCT: 2 % (ref 0–5)
Eosinophils Absolute: 0.1 10*3/uL (ref 0.0–0.7)
HEMATOCRIT: 42.4 % (ref 36.0–46.0)
HEMOGLOBIN: 13.9 g/dL (ref 12.0–15.0)
Lymphocytes Relative: 32 % (ref 12–46)
Lymphs Abs: 1.9 10*3/uL (ref 0.7–4.0)
MCH: 29.8 pg (ref 26.0–34.0)
MCHC: 32.8 g/dL (ref 30.0–36.0)
MCV: 90.8 fL (ref 78.0–100.0)
MONOS PCT: 11 % (ref 3–12)
Monocytes Absolute: 0.6 10*3/uL (ref 0.1–1.0)
Neutro Abs: 3.2 10*3/uL (ref 1.7–7.7)
Neutrophils Relative %: 55 % (ref 43–77)
Platelets: 209 10*3/uL (ref 150–400)
RBC: 4.67 MIL/uL (ref 3.87–5.11)
RDW: 13.7 % (ref 11.5–15.5)
WBC: 5.9 10*3/uL (ref 4.0–10.5)

## 2013-08-26 LAB — URINALYSIS, ROUTINE W REFLEX MICROSCOPIC
Bilirubin Urine: NEGATIVE
Glucose, UA: NEGATIVE mg/dL
Hgb urine dipstick: NEGATIVE
KETONES UR: NEGATIVE mg/dL
Leukocytes, UA: NEGATIVE
NITRITE: NEGATIVE
PROTEIN: NEGATIVE mg/dL
Specific Gravity, Urine: 1.004 — ABNORMAL LOW (ref 1.005–1.030)
Urobilinogen, UA: 0.2 mg/dL (ref 0.0–1.0)
pH: 7.5 (ref 5.0–8.0)

## 2013-08-26 NOTE — ED Provider Notes (Signed)
CSN: [id number]     Arrival date & time 08/26/13  2128 History  This chart was scribed for Ezequiel Essex, MD by Rolanda Lundborg, ED Scribe. This patient was seen in room MH11/MH11 and the patient's care was started at 9:55 PM.      Chief Complaint  Patient presents with  . Dizziness   The history is provided by the patient. No language interpreter was used.   HPI Comments: Julia [name] is a 79 y.o. female with a h/o HTN, hypothyroid, and mini strokes who presents to the Emergency Department complaining of lightheadedness described as "my head felt funny" that started this afternoon around 6pm after eating a hotdog. She reports associated mild trembling in her legs. She reports she worked outside today and did not drink much water. She states the lightheadedness has resolved but her legs still feel a little bit "shaky". She denies feeling like the room is spinning, blurred or double vision. She denies CP, SOB, abdominal pain, nausea, headache, trouble speaking, trouble swallowing, weakness. She denies h/o lightheadedness. She has been seeing a urologist and has been dehydrated 3-4 times over the last few months. Daughter present, states pt has seemed normal. She denies h/o heart problems. She takes aspirin daily.   Past Medical History  Diagnosis Date  . GERD (gastroesophageal reflux disease)   . Hypertension   . Hyperlipidemia   . Osteopenia   . History of colonic polyps   . Right forearm fracture 2011  . Anxiety   . Thyroid disease    Past Surgical History  Procedure Laterality Date  . Rotator cuff repair      right shoulder  . Bladder tack    . Tonsillectomy    . Bilateral salpingoophorectomy    . Surgical repair to fx left wrist    . Abdominal hysterectomy  2009   Family History  Problem Relation Age of Onset  . Lung cancer    . Hypertension Mother   . COPD Mother   . Cancer Father   . Stomach cancer Father   . Heart disease Brother   . Alzheimer's disease Maternal  Aunt   . Drug abuse Maternal Uncle   . Cancer Maternal Uncle   . Colon cancer Neg Hx   . Esophageal cancer Neg Hx   . Rectal cancer Neg Hx    History  Substance Use Topics  . Smoking status: Never Smoker   . Smokeless tobacco: Never Used  . Alcohol Use: No   OB History   Grav Para Term Preterm Abortions TAB SAB Ect Mult Living                 Review of Systems  HENT: Negative for trouble swallowing.   Respiratory: Negative for shortness of breath.   Cardiovascular: Negative for chest pain.  Gastrointestinal: Negative for nausea and abdominal pain.  Neurological: Positive for light-headedness. Negative for speech difficulty, weakness and headaches.   A complete 10 system review of systems was obtained and all systems are negative except as noted in the HPI and PMH.     Allergies  Entex and Sulfonamide derivatives  Home Medications   Current Outpatient Rx  Name  Route  Sig  Dispense  Refill  . acetaminophen (TYLENOL) 500 MG tablet   Oral   Take 500 mg by mouth at bedtime as needed for pain.         Marland Kitchen ALPRAZolam (XANAX) 0.25 MG tablet      TAKE ONE TABLET  BY MOUTH THREE TIMES DAILY AS NEEDED   60 tablet   3   . aspirin 81 MG tablet   Oral   Take 162 mg by mouth daily.          . Calcium Carbonate-Vitamin D (CALCIUM-VITAMIN D) 500-200 MG-UNIT per tablet   Oral   Take 1 tablet by mouth 2 (two) times daily with a meal.         . famotidine (PEPCID) 20 MG tablet   Oral   Take 20 mg by mouth 2 (two) times daily.         . Fish Oil OIL   Does not apply   by Does not apply route.         . galantamine (RAZADYNE ER) 16 MG 24 hr capsule   Oral   Take 1 capsule (16 mg total) by mouth daily with breakfast.   30 capsule   2   . Ibuprofen 200 MG CAPS   Oral   Take 200 mg by mouth 2 (two) times daily.          Marland Kitchen imipramine (TOFRANIL) 50 MG tablet      TAKE ONE TABLET BY MOUTH EVERY DAY AT NOON AND TWO IN THE EVENING   270 tablet   3   .  levothyroxine (SYNTHROID, LEVOTHROID) 88 MCG tablet      TAKE ONE TABLET BY MOUTH EVERY DAY ON EMPTY STOMACH   90 tablet   3   . nadolol (CORGARD) 40 MG tablet   Oral   Take 1 tablet (40 mg total) by mouth daily.   30 tablet   11   . rosuvastatin (CRESTOR) 20 MG tablet   Oral   Take 1 tablet (20 mg total) by mouth once a week.   90 tablet   3   . sertraline (ZOLOFT) 50 MG tablet      Take one tablet (50 mg total) by mouth daily.   30 tablet   6    BP 182/68  Pulse 57  Temp(Src) 97.9 F (36.6 C) (Oral)  Resp 18  Ht 5' 3.5" (1.613 m)  Wt 120 lb (54.432 kg)  BMI 20.92 kg/m2  SpO2 99% Physical Exam  Nursing note and vitals reviewed. Constitutional: She is oriented to person, place, and time. She appears well-developed and well-nourished. No distress.  HENT:  Head: Normocephalic and atraumatic.  Eyes: EOM are normal.  Neck: Neck supple. No tracheal deviation present.  No meningismus  Cardiovascular: Normal rate and regular rhythm.   No murmur heard. Pulmonary/Chest: Effort normal and breath sounds normal. No respiratory distress.  Musculoskeletal: Normal range of motion.  Neurological: She is alert and oriented to person, place, and time.  CN 2-12 intact, no ataxia on finger to nose, no nystagmus, 5/5 strength throughout, no pronator drift, Romberg negative, normal gait.   Skin: Skin is warm and dry.  Psychiatric: She has a normal mood and affect. Her behavior is normal.    ED Course  Procedures (including critical care time) Medications  sodium chloride 0.9 % bolus 1,000 mL (0 mLs Intravenous Stopped 08/27/13 0049)    DIAGNOSTIC STUDIES: Oxygen Saturation is 99% on RA, normal by my interpretation.    COORDINATION OF CARE: 10:02 PM- Discussed treatment plan with pt which includes CT head and blood work. Pt agrees to plan.    Labs Review Labs Reviewed  COMPREHENSIVE METABOLIC PANEL - Abnormal; Notable for the following:    Glucose, Bld 117 (*)  Creatinine, Ser 1.30 (*)    GFR calc non Af Amer 39 (*)    GFR calc Af Amer 45 (*)    All other components within normal limits  URINALYSIS, ROUTINE W REFLEX MICROSCOPIC - Abnormal; Notable for the following:    Specific Gravity, Urine 1.004 (*)    All other components within normal limits  CBC WITH DIFFERENTIAL  TROPONIN I   Imaging Review Dg Chest 2 View  08/26/2013   CLINICAL DATA:  Weakness  EXAM: CHEST  2 VIEW  COMPARISON:  10/24/2012  FINDINGS: Mild cardiomegaly. Aortic tortuosity. No edema, consolidation, effusion, or pneumothorax. There is chronic scarring or recurrent atelectasis in the lingula. Atelectasis  IMPRESSION: No active cardiopulmonary disease.   Electronically Signed   By: Jorje Guild M.D.   On: 08/26/2013 23:37   Ct Head Wo Contrast  08/27/2013   CLINICAL DATA:  Dizziness.  EXAM: CT HEAD WITHOUT CONTRAST  TECHNIQUE: Contiguous axial images were obtained from the base of the skull through the vertex without intravenous contrast.  COMPARISON:  08/05/2011  FINDINGS: Skull and Sinuses:There is opacification of posterior ethmoid air cells on the right. No expansion compared to prior to suggest mucocele. No sinus effusion. Stable few calvarial lucencies.  Orbits: No acute abnormality.  Brain: No evidence of acute abnormality, such as acute infarction, hemorrhage, hydrocephalus, or mass lesion/mass effect. There is generalized cerebral volume loss which appears stable from 2013. Extensive chronic small vessel disease with pattern unchanged from prior. Ischemic gliosis is patchy throughout the bilateral cerebral white matter.  IMPRESSION: 1. No evidence of acute intracranial disease. 2. Brain atrophy and extensive chronic small vessel disease. 3. Right ethmoid sinusitis without acute effusion.   Electronically Signed   By: Jorje Guild M.D.   On: 08/27/2013 00:03     EKG Interpretation   Date/Time:  Monday August 26 2013 21:39:02 EDT Ventricular Rate:  56 PR Interval:   152 QRS Duration: 88 QT Interval:  426 QTC Calculation: 411 R Axis:   -18 Text Interpretation:  Sinus bradycardia Nonspecific ST and T wave  abnormality Abnormal ECG No significant change was found Confirmed by  Wyvonnia Dusky  MD, Juvencio Verdi [phone]) on 08/26/2013 10:28:39 PM      MDM   Final diagnoses:  Weakness   patient is a poor historian. She describes a feeling of "not feeling right in her head" that onset about 6 PM. Is associated with some shaky feeling in her legs. The symptoms have since improved. No focal weakness, numbness or tingling. No fever or headache. No difficulty speaking, swallowing she is at her baseline per family. No facial droop.  EKG is unchanged with inferior lateral ST depressions. CT head not acute.  She is at her baseline mental status per family. Her neurological exam is nonfocal. She's ambulatory without difficulty or dizziness.  Labwork is unremarkable. Hemoglobin and creatinine are stable. Patient is tolerating by mouth in the ED she also received an IV fluids. She is feeling better. She denies any focal weakness, numbness or tingling. No dizziness or lightheadedness. She is at her baseline per family. She has someone to watch her tonight. Suspect etiology of her symptoms earlier were due to poor by mouth intake today.  She will followup with her Dr. this week. Return precautions discussed.  BP 197/61  Pulse 50  Temp(Src) 96.6 F (35.9 C) (Axillary)  Resp 18  Ht 5' 3.5" (1.613 m)  Wt 120 lb (54.432 kg)  BMI 20.92 kg/m2  SpO2 100%  I personally performed the services described in this documentation, which was scribed in my presence. The recorded information has been reviewed and is accurate.   Ezequiel Essex, MD 08/27/13 [phone]

## 2013-08-26 NOTE — ED Notes (Signed)
Lightheadedness since this afternoon. States her legs feel shaky. Did not eat much today and has been working around the house a lot.

## 2013-08-27 LAB — COMPREHENSIVE METABOLIC PANEL
ALBUMIN: 4 g/dL (ref 3.5–5.2)
ALT: 20 U/L (ref 0–35)
AST: 29 U/L (ref 0–37)
Alkaline Phosphatase: 50 U/L (ref 39–117)
BILIRUBIN TOTAL: 0.4 mg/dL (ref 0.3–1.2)
BUN: 23 mg/dL (ref 6–23)
CHLORIDE: 100 meq/L (ref 96–112)
CO2: 29 meq/L (ref 19–32)
CREATININE: 1.3 mg/dL — AB (ref 0.50–1.10)
Calcium: 10 mg/dL (ref 8.4–10.5)
GFR calc Af Amer: 45 mL/min — ABNORMAL LOW (ref >90)
GFR, EST NON AFRICAN AMERICAN: 39 mL/min — AB (ref >90)
Glucose, Bld: 117 mg/dL — ABNORMAL HIGH (ref 70–99)
Potassium: 4.2 mEq/L (ref 3.7–5.3)
Sodium: 141 mEq/L (ref 137–147)
Total Protein: 6.6 g/dL (ref 6.0–8.3)

## 2013-08-27 LAB — TROPONIN I

## 2013-08-27 MED ORDER — SODIUM CHLORIDE 0.9 % IV BOLUS (SEPSIS)
1000.0000 mL | Freq: Once | INTRAVENOUS | Status: AC
  Administered 2013-08-27: 1000 mL via INTRAVENOUS

## 2013-08-27 NOTE — ED Notes (Signed)
Pt educated on treatment of high bp.  Informed to followup with pcp regarding med changes if necessary.  MD aware of discharge bp.

## 2013-08-27 NOTE — Discharge Instructions (Signed)
Fatigue There is no evidence of heart attack, stroke, or serious infection. Keep yourself hydrated. Follow up with Dr. Arnoldo Morale this week. Return to the ED if you develop new or worsening symptoms. Fatigue is a feeling of tiredness, lack of energy, lack of motivation, or feeling tired all the time. Having enough rest, good nutrition, and reducing stress will normally reduce fatigue. Consult your caregiver if it persists. The nature of your fatigue will help your caregiver to find out its cause. The treatment is based on the cause.  CAUSES  There are many causes for fatigue. Most of the time, fatigue can be traced to one or more of your habits or routines. Most causes fit into one or more of three general areas. They are: Lifestyle problems  Sleep disturbances.  Overwork.  Physical exertion.  Unhealthy habits.  Poor eating habits or eating disorders.  Alcohol and/or drug use .  Lack of proper nutrition (malnutrition). Psychological problems  Stress and/or anxiety problems.  Depression.  Grief.  Boredom. Medical Problems or Conditions  Anemia.  Pregnancy.  Thyroid gland problems.  Recovery from major surgery.  Continuous pain.  Emphysema or asthma that is not well controlled  Allergic conditions.  Diabetes.  Infections (such as mononucleosis).  Obesity.  Sleep disorders, such as sleep apnea.  Heart failure or other heart-related problems.  Cancer.  Kidney disease.  Liver disease.  Effects of certain medicines such as antihistamines, cough and cold remedies, prescription pain medicines, heart and blood pressure medicines, drugs used for treatment of cancer, and some antidepressants. SYMPTOMS  The symptoms of fatigue include:   Lack of energy.  Lack of drive (motivation).  Drowsiness.  Feeling of indifference to the surroundings. DIAGNOSIS  The details of how you feel help guide your caregiver in finding out what is causing the fatigue. You will be  asked about your present and past health condition. It is important to review all medicines that you take, including prescription and non-prescription items. A thorough exam will be done. You will be questioned about your feelings, habits, and normal lifestyle. Your caregiver may suggest blood tests, urine tests, or other tests to look for common medical causes of fatigue.  TREATMENT  Fatigue is treated by correcting the underlying cause. For example, if you have continuous pain or depression, treating these causes will improve how you feel. Similarly, adjusting the dose of certain medicines will help in reducing fatigue.  HOME CARE INSTRUCTIONS   Try to get the required amount of good sleep every night.  Eat a healthy and nutritious diet, and drink enough water throughout the day.  Practice ways of relaxing (including yoga or meditation).  Exercise regularly.  Make plans to change situations that cause stress. Act on those plans so that stresses decrease over time. Keep your work and personal routine reasonable.  Avoid street drugs and minimize use of alcohol.  Start taking a daily multivitamin after consulting your caregiver. SEEK MEDICAL CARE IF:   You have persistent tiredness, which cannot be accounted for.  You have fever.  You have unintentional weight loss.  You have headaches.  You have disturbed sleep throughout the night.  You are feeling sad.  You have constipation.  You have dry skin.  You have gained weight.  You are taking any new or different medicines that you suspect are causing fatigue.  You are unable to sleep at night.  You develop any unusual swelling of your legs or other parts of your body. [place]  CARE IF:   You are feeling confused.  Your vision is blurred.  You feel faint or pass out.  You develop severe headache.  You develop severe abdominal, pelvic, or back pain.  You develop chest pain, shortness of breath, or an  irregular or fast heartbeat.  You are unable to pass a normal amount of urine.  You develop abnormal bleeding such as bleeding from the rectum or you vomit blood.  You have thoughts about harming yourself or committing suicide.  You are worried that you might harm someone else. MAKE SURE YOU:   Understand these instructions.  Will watch your condition.  Will get help right away if you are not doing well or get worse. Document Released: 03/13/2007 Document Revised: 08/08/2011 Document Reviewed: 03/13/2007 [facility] Patient Information 2014 [place].

## 2013-09-03 ENCOUNTER — Telehealth: Payer: Self-pay | Admitting: Neurology

## 2013-09-03 MED ORDER — GALANTAMINE HYDROBROMIDE ER 16 MG PO CP24: 8.0000 mg | ORAL_CAPSULE | Freq: Every day | ORAL | Status: DC

## 2013-09-03 NOTE — Telephone Encounter (Signed)
Galantamine ER 16 mg refill requested. Per last office note- patient to remain on medication. Refill approved and sent to patient's pharmacy.

## 2013-09-05 ENCOUNTER — Encounter: Payer: Self-pay | Admitting: Neurology

## 2013-09-05 ENCOUNTER — Ambulatory Visit (INDEPENDENT_AMBULATORY_CARE_PROVIDER_SITE_OTHER): Payer: Medicare Other | Admitting: Neurology

## 2013-09-05 VITALS — BP 112/68 | HR 78 | Temp 98.0°F | Resp 18 | Ht 63.0 in | Wt 121.4 lb

## 2013-09-05 DIAGNOSIS — R413 Other amnesia: Secondary | ICD-10-CM

## 2013-09-05 DIAGNOSIS — G3184 Mild cognitive impairment, so stated: Secondary | ICD-10-CM

## 2013-09-05 NOTE — Patient Instructions (Signed)
I think you are doing well.  I would continue the galantamine 16mg  daily.  No changes at this time.  Follow up in 6 months but call with questions or concerns.

## 2013-09-05 NOTE — Progress Notes (Signed)
NEUROLOGY FOLLOW UP OFFICE NOTE  Julia [name] [id number]  HISTORY OF PRESENT ILLNESS: Julia [name] is a 79 year old woman with HTN, hyperlipidemia, anxiety and thyroid disease, who returns for amnestic mild cognitive impairment.  She is accompanied by her daughters.  Records and images were personally reviewed where available.    UPDATE: She was started on galantamine ER 16mg  daily.  She has been tolerating it and cognitively has been stable.  Tragically, her daughter committed suicide in January.  She appears to be bereaving appropriately.  She is starting to go for walks again.  She sometimes forgets to take certain medications like her ASA.  HISTORY: Onset of symptoms began 4 years ago, starting with misplacing things such as her keys, and would often repeat questions. She is particularly having short-term memory problems. She has a long-standing history of anxiety. Approximately 2 years ago, her younger brother unexpectedly passed away. This traumatic event, as well as managing his affairs, causes a great deal of increased stress and anxiety. Since that time, her personality and memory has been further altered. She occasionally is confused but doesn't really become disoriented when driving. She rarely drives and only usually drives to familiar places such as the market or the hair salon. Usually she is accompanied by her husband, or her husband drives. Her husband has several medical problems but he is very helpful around the house. They help each other out. She makes sure that he doesn't miss any appointments. He will often do the cooking and go shopping.the home is kept clean, but not quite as clean as it used to be. She does mind her hygiene. She is tearful at times. She was initially started on an SSRI but stopped after a couple of months. She does have an aversion to taking medications. She was subsequently started on Aricept but discontinued it after experiencing nausea and  vomiting. She has not had any delusions or hallucinations. Overall her sleeping habits are good. Occasionally, if she is anxious, she will take a Xanax. She has difficulty performing activities that she has long enjoyed, such as sewing.  She has tried low dose Aricept which made her sick.  MRI of the brain from 12/04/12 was reviewed and revealed mild to moderate atrophy with moderately extensive small vessel ischemic changes and small chronic lacunar infarct.  B12 level was 301.   MMSE score from 03/22/13 was 27/30.  She underwent neuropsychological testing, which did reveal mild cognitive impairment, somewhat complicated by periods of depression and anxiety.  PAST MEDICAL HISTORY: Past Medical History  Diagnosis Date  . GERD (gastroesophageal reflux disease)   . Hypertension   . Hyperlipidemia   . Osteopenia   . History of colonic polyps   . Right forearm fracture 2011  . Anxiety   . Thyroid disease     MEDICATIONS: Current Outpatient Prescriptions on File Prior to Visit  Medication Sig Dispense Refill  . acetaminophen (TYLENOL) 500 MG tablet Take 500 mg by mouth at bedtime as needed for pain.      Marland Kitchen ALPRAZolam (XANAX) 0.25 MG tablet TAKE ONE TABLET BY MOUTH THREE TIMES DAILY AS NEEDED  60 tablet  3  . Fish Oil OIL by Does not apply route.      . galantamine (RAZADYNE ER) 16 MG 24 hr capsule Take 1 capsule (16 mg total) by mouth daily with breakfast.  30 capsule  2  . Ibuprofen 200 MG CAPS Take 200 mg by mouth 2 (two) times daily.       Marland Kitchen  imipramine (TOFRANIL) 50 MG tablet TAKE ONE TABLET BY MOUTH EVERY DAY AT NOON AND TWO IN THE EVENING  270 tablet  3  . levothyroxine (SYNTHROID, LEVOTHROID) 88 MCG tablet TAKE ONE TABLET BY MOUTH EVERY DAY ON EMPTY STOMACH  90 tablet  3  . nadolol (CORGARD) 40 MG tablet Take 1 tablet (40 mg total) by mouth daily.  30 tablet  11  . rosuvastatin (CRESTOR) 20 MG tablet Take 1 tablet (20 mg total) by mouth once a week.  90 tablet  3  . sertraline  (ZOLOFT) 50 MG tablet Take one tablet (50 mg total) by mouth daily.  30 tablet  6  . aspirin 81 MG tablet Take 162 mg by mouth daily.       . Calcium Carbonate-Vitamin D (CALCIUM-VITAMIN D) 500-200 MG-UNIT per tablet Take 1 tablet by mouth 2 (two) times daily with a meal.      . famotidine (PEPCID) 20 MG tablet Take 20 mg by mouth 2 (two) times daily.       No current facility-administered medications on file prior to visit.    ALLERGIES: Allergies  Allergen Reactions  . Entex Other (See Comments)    unknown  . Sulfonamide Derivatives Other (See Comments)    unknown    FAMILY HISTORY: Family History  Problem Relation Age of Onset  . Lung cancer    . Hypertension Mother   . COPD Mother   . Cancer Father   . Stomach cancer Father   . Heart disease Brother   . Alzheimer's disease Maternal Aunt   . Drug abuse Maternal Uncle   . Cancer Maternal Uncle   . Colon cancer Neg Hx   . Esophageal cancer Neg Hx   . Rectal cancer Neg Hx     SOCIAL HISTORY: History   Social History  . Marital Status: Married    Spouse Name: N/A    Number of Children: N/A  . Years of Education: N/A   Occupational History  . retired    Social History Main Topics  . Smoking status: Never Smoker   . Smokeless tobacco: Never Used  . Alcohol Use: No  . Drug Use: No  . Sexual Activity: Not on file   Other Topics Concern  . Not on file   Social History Narrative  . No narrative on file    REVIEW OF SYSTEMS: Constitutional: No fevers, chills, or sweats, no generalized fatigue, change in appetite Eyes: No visual changes, double vision, eye pain Ear, nose and throat: No hearing loss, ear pain, nasal congestion, sore throat Cardiovascular: No chest pain, palpitations Respiratory:  No shortness of breath at rest or with exertion, wheezes GastrointestinaI: No nausea, vomiting, diarrhea, abdominal pain, fecal incontinence Genitourinary:  No dysuria, urinary retention or frequency Musculoskeletal:   No neck pain, back pain Integumentary: No rash, pruritus, skin lesions Neurological: as above Psychiatric: No depression, insomnia, anxiety Endocrine: No palpitations, fatigue, diaphoresis, mood swings, change in appetite, change in weight, increased thirst Hematologic/Lymphatic:  No anemia, purpura, petechiae. Allergic/Immunologic: no itchy/runny eyes, nasal congestion, recent allergic reactions, rashes  PHYSICAL EXAM: Filed Vitals:   09/05/13 1052  BP: 112/68  Pulse: 78  Temp: 98 F (36.7 C)  Resp: 18   General: No acute distress Head:  Normocephalic/atraumatic Neck: supple, no paraspinal tenderness, full range of motion Heart:  Regular rate and rhythm Lungs:  Clear to auscultation bilaterally Back: No paraspinal tenderness Neurological Exam: alert and oriented to person, place, and time (except date). Attention  span and concentration intact, recent and remote memory intact, fund of knowledge intact.  Recalls 3 of 3 words after a couple of minutes.  Able to spell WORLD backwards.  Able to name, repeat, write, read and follow 3 step commands across midline.  Did not quite copy intersecting pentagons correctly.  MMSE 28/30.  Speech fluent and not dysarthric.  CN II-XII intact. Fundoscopic exam unremarkable without vessel changes, exudates, hemorrhages or papilledema.  Bulk and tone normal, muscle strength 5/5 throughout.  Sensation to light touch intact.  Deep tendon reflexes 2+ throughout.  Finger to nose  intact.  Gait normal.  IMPRESSION: 1.  Amnestic Mild Cognitive Impairment, stable 2.  Depression and bereavement 3.  Cerebrovascular disease  PLAN: 1.  Continue galantamine ER 16mg  daily 2.ASA for secondary stroke prevention 3 Statin (LDL goal less than 100) 4  Sertraline 5. Follow up in 6 months.  Metta Clines, DO  CC:  Benay Pillow, MD

## 2013-09-26 ENCOUNTER — Telehealth: Payer: Self-pay | Admitting: Internal Medicine

## 2013-09-26 NOTE — Telephone Encounter (Signed)
Pt takes she has some memory issues and she saw where there is a new drug numenda.  Pt would like to know if we could send her any info on numenda. Pt doesn;'t use computer or have access to one.

## 2013-09-26 NOTE — Telephone Encounter (Signed)
Spoke with patient and advised her to call her pharmacy about Numenda.  I lost connection and attempted to call back but the line was busy.

## 2013-10-01 ENCOUNTER — Ambulatory Visit: Payer: Self-pay | Admitting: Neurology

## 2013-10-02 ENCOUNTER — Telehealth: Payer: Self-pay | Admitting: *Deleted

## 2013-10-02 NOTE — Telephone Encounter (Signed)
Galantamine ER 16 mg # 30 1 po  Qd at breakfast  With 2 refill called to  [place]

## 2013-11-06 ENCOUNTER — Other Ambulatory Visit: Payer: Self-pay | Admitting: Internal Medicine

## 2013-11-21 ENCOUNTER — Ambulatory Visit (INDEPENDENT_AMBULATORY_CARE_PROVIDER_SITE_OTHER): Payer: Medicare Other | Admitting: Family Medicine

## 2013-11-21 VITALS — BP 140/70 | HR 60 | Temp 97.7°F | Wt 124.0 lb

## 2013-11-21 DIAGNOSIS — I10 Essential (primary) hypertension: Secondary | ICD-10-CM

## 2013-11-21 DIAGNOSIS — W19XXXA Unspecified fall, initial encounter: Secondary | ICD-10-CM

## 2013-11-21 DIAGNOSIS — S0081XA Abrasion of other part of head, initial encounter: Secondary | ICD-10-CM

## 2013-11-21 DIAGNOSIS — IMO0002 Reserved for concepts with insufficient information to code with codable children: Secondary | ICD-10-CM

## 2013-11-21 DIAGNOSIS — R42 Dizziness and giddiness: Secondary | ICD-10-CM

## 2013-11-21 DIAGNOSIS — E039 Hypothyroidism, unspecified: Secondary | ICD-10-CM

## 2013-11-21 NOTE — Progress Notes (Signed)
Pre visit review using our clinic review tool, if applicable. No additional management support is needed unless otherwise documented below in the visit note. 

## 2013-11-21 NOTE — Progress Notes (Signed)
Subjective:    Patient ID: Julia [name], female    DOB: 05/21/1935, 79 y.o.   MRN: [id number]  Fall Pertinent negatives include no abdominal pain, fever, headaches, nausea or vomiting.   Patient seen with episode of dizziness and falling fall yesterday. She had been doing some walking without difficulty and went down to turn on water nozzle. When standing she recalls feeling somewhat dizzy and then fell forward. There was no loss of consciousness. She has felt fine today. She denies any recent focal weakness, confusion, headache, nausea, vomiting, dyspnea, chest pain. No other injuries. She had several abrasions on her face. She denies any orthostatic type symptoms recently.  Hypothyroidism and has not had labs in over one year. Her most recent TSH on record was slightly low.  Past Medical History  Diagnosis Date  . GERD (gastroesophageal reflux disease)   . Hypertension   . Hyperlipidemia   . Osteopenia   . History of colonic polyps   . Right forearm fracture 2011  . Anxiety   . Thyroid disease    Past Surgical History  Procedure Laterality Date  . Rotator cuff repair      right shoulder  . Bladder tack    . Tonsillectomy    . Bilateral salpingoophorectomy    . Surgical repair to fx left wrist    . Abdominal hysterectomy  2009    reports that she has never smoked. She has never used smokeless tobacco. She reports that she does not drink alcohol or use illicit drugs. family history includes Alzheimer's disease in her maternal aunt; COPD in her mother; Cancer in her father and maternal uncle; Drug abuse in her maternal uncle; Heart disease in her brother; Hypertension in her mother; Lung cancer in an other family member; Stomach cancer in her father. There is no history of Colon cancer, Esophageal cancer, or Rectal cancer. Allergies  Allergen Reactions  . Entex Other (See Comments)    unknown  . Sulfonamide Derivatives Other (See Comments)    unknown      Review of  Systems  Constitutional: Negative for fever and chills.  Respiratory: Negative for shortness of breath.   Cardiovascular: Negative for chest pain and leg swelling.  Gastrointestinal: Negative for nausea, vomiting and abdominal pain.  Neurological: Positive for dizziness. Negative for tremors, seizures, syncope, speech difficulty, weakness, light-headedness and headaches.       Objective:   Physical Exam  Constitutional: She is oriented to person, place, and time. She appears well-developed and well-nourished.  HENT:  Right Ear: External ear normal.  Left Ear: External ear normal.  Mouth/Throat: Oropharynx is clear and moist.  She has multiple abrasions including right for head, bridge of nose, and right upper lip area. She also has some ecchymosis below both eyes.  Neck: Neck supple. No thyromegaly present.  Cardiovascular: Normal rate and regular rhythm.   Pulmonary/Chest: Effort normal and breath sounds normal. No respiratory distress. She has no wheezes. She has no rales.  Lymphadenopathy:    She has no cervical adenopathy.  Neurological: She is alert and oriented to person, place, and time. She has normal reflexes. No cranial nerve deficit.  She is able to ambulate without difficulty and able to transfer from sitting to standing without difficulty. No focal weakness.          Assessment & Plan:  Recent fall -yesterday. She did not have any report syncope. She had some vague dizziness and has difficulty describing.  ?vertigo. None today. She  has multiple facial abrasions but no concerning red flags such as confusion or headache. Check CBC, basic metabolic panel, and TSH. She has hypothyroidism and has not had TSH assessment over one year No orthostasis by exam today- standing BP 150/80.

## 2013-11-22 LAB — CBC WITH DIFFERENTIAL/PLATELET
BASOS PCT: 0.4 % (ref 0.0–3.0)
Basophils Absolute: 0 10*3/uL (ref 0.0–0.1)
Eosinophils Absolute: 0.1 10*3/uL (ref 0.0–0.7)
Eosinophils Relative: 1.6 % (ref 0.0–5.0)
HCT: 40.8 % (ref 36.0–46.0)
HEMOGLOBIN: 13.7 g/dL (ref 12.0–15.0)
LYMPHS PCT: 25.9 % (ref 12.0–46.0)
Lymphs Abs: 1.6 10*3/uL (ref 0.7–4.0)
MCHC: 33.6 g/dL (ref 30.0–36.0)
MCV: 88.9 fl (ref 78.0–100.0)
MONOS PCT: 8.6 % (ref 3.0–12.0)
Monocytes Absolute: 0.5 10*3/uL (ref 0.1–1.0)
NEUTROS PCT: 63.5 % (ref 43.0–77.0)
Neutro Abs: 3.8 10*3/uL (ref 1.4–7.7)
Platelets: 191 10*3/uL (ref 150.0–400.0)
RBC: 4.59 Mil/uL (ref 3.87–5.11)
RDW: 13.9 % (ref 11.5–15.5)
WBC: 6 10*3/uL (ref 4.0–10.5)

## 2013-11-22 LAB — BASIC METABOLIC PANEL
BUN: 21 mg/dL (ref 6–23)
CO2: 31 mEq/L (ref 19–32)
CREATININE: 1.2 mg/dL (ref 0.4–1.2)
Calcium: 9.2 mg/dL (ref 8.4–10.5)
Chloride: 101 mEq/L (ref 96–112)
GFR: 45.02 mL/min — AB (ref >60.00)
Glucose, Bld: 103 mg/dL — ABNORMAL HIGH (ref 70–99)
Potassium: 4.7 mEq/L (ref 3.5–5.1)
SODIUM: 137 meq/L (ref 135–145)

## 2013-11-22 LAB — TSH: TSH: 0.42 u[IU]/mL (ref 0.35–4.50)

## 2013-11-30 ENCOUNTER — Other Ambulatory Visit: Payer: Self-pay | Admitting: Internal Medicine

## 2013-12-16 ENCOUNTER — Telehealth: Payer: Self-pay | Admitting: Neurology

## 2013-12-16 NOTE — Telephone Encounter (Signed)
Pt wants to talk to someone about getting into a study please call [phone]

## 2013-12-16 NOTE — Telephone Encounter (Signed)
Called patient. She states she saw something on television about an Alzheimer's study. She wanted to know if she could get into that study. I asked her if she had anymore information about what she saw she said she didn't know any of it. I explained to her that it would difficult to determine exactly what this study was without some additional information. I did advise that if she sees it again to try and get more information and write it down so it can be discussed with Dr. Tomi Likens. She can give [place] a callback with that information or she can discuss it with him at her next follow-up visit in October.

## 2013-12-27 ENCOUNTER — Telehealth: Payer: Self-pay | Admitting: Internal Medicine

## 2013-12-27 NOTE — Telephone Encounter (Signed)
Pt would like to know if you will accept her as a pt? She is former Julia [name] pt and her brother, Julia [name] and his wife are pts of yours.

## 2014-01-01 ENCOUNTER — Encounter: Payer: Medicare Other | Admitting: Internal Medicine

## 2014-01-06 ENCOUNTER — Other Ambulatory Visit: Payer: Self-pay | Admitting: Neurology

## 2014-01-06 NOTE — Telephone Encounter (Signed)
Let's encourage these folks to try one of our providers who do not have full panels.

## 2014-02-19 ENCOUNTER — Other Ambulatory Visit: Payer: Self-pay | Admitting: Neurology

## 2014-02-25 ENCOUNTER — Telehealth: Payer: Self-pay | Admitting: *Deleted

## 2014-02-25 ENCOUNTER — Telehealth: Payer: Self-pay | Admitting: Internal Medicine

## 2014-02-25 MED ORDER — ALPRAZOLAM 0.25 MG PO TABS: 0.2500 mg | ORAL_TABLET | Freq: Every evening | ORAL | Status: DC | PRN

## 2014-02-25 NOTE — Telephone Encounter (Signed)
Dr. Hunter see below 

## 2014-02-25 NOTE — Telephone Encounter (Signed)
Pt daughter notified   

## 2014-02-25 NOTE — Telephone Encounter (Signed)
Given circumstance, may fill x 2 months to get to appointment with me.

## 2014-02-25 NOTE — Telephone Encounter (Signed)
Patient's mother advised to call pcp for xanax for patient

## 2014-02-25 NOTE — Telephone Encounter (Signed)
Please call patients daughter Kenney Houseman) in reference to a RX Callback number [phone]

## 2014-02-25 NOTE — Telephone Encounter (Signed)
Pt (daughter) has been trying to get a refill of ALPRAZolam (XANAX) 0.25 MG tablet for pt  but we have denied.  Pt has alzheimer's and daughter states they are beginning to see side effects from not taking. Pt had appt 10/13 that had to be resch due to dr schedule/  Daughter thought they still had bc someone had spoke to the pt, who of course doesn't remember. appt resc to 11/23.   Can pt get this refill, or do you want me to bring her in earlier than the scheduled 11/23? [facility]

## 2014-03-05 ENCOUNTER — Telehealth: Payer: Self-pay | Admitting: Neurology

## 2014-03-05 NOTE — Telephone Encounter (Signed)
Marcie Bal, Pt's daughter called requesting to speak to a nurse regarding her mother's upcoming appt on Friday 03/07/14. Please call Marcie Bal back # [phone]

## 2014-03-07 ENCOUNTER — Telehealth: Payer: Self-pay | Admitting: Neurology

## 2014-03-07 ENCOUNTER — Ambulatory Visit (INDEPENDENT_AMBULATORY_CARE_PROVIDER_SITE_OTHER): Payer: Medicare Other | Admitting: Neurology

## 2014-03-07 ENCOUNTER — Encounter: Payer: Self-pay | Admitting: Neurology

## 2014-03-07 VITALS — BP 122/84 | HR 76 | Resp 16 | Ht 63.0 in | Wt 125.0 lb

## 2014-03-07 DIAGNOSIS — F0280 Dementia in other diseases classified elsewhere without behavioral disturbance: Secondary | ICD-10-CM

## 2014-03-07 DIAGNOSIS — F329 Major depressive disorder, single episode, unspecified: Secondary | ICD-10-CM

## 2014-03-07 DIAGNOSIS — G309 Alzheimer's disease, unspecified: Secondary | ICD-10-CM

## 2014-03-07 DIAGNOSIS — R2681 Unsteadiness on feet: Secondary | ICD-10-CM

## 2014-03-07 DIAGNOSIS — F411 Generalized anxiety disorder: Secondary | ICD-10-CM

## 2014-03-07 DIAGNOSIS — F32A Depression, unspecified: Secondary | ICD-10-CM

## 2014-03-07 MED ORDER — MEMANTINE HCL ER 7 MG PO CP24: ORAL_CAPSULE | ORAL | Status: DC

## 2014-03-07 NOTE — Telephone Encounter (Signed)
Patient referred to [facility] for PT. Message sent in EPIC to coordinator. Referral placed in system.

## 2014-03-07 NOTE — Patient Instructions (Signed)
1.  We will start memantine ER (Namenda XR) 7mg  tablets.  We will increase dose to goal of 28mg  daily, as per the following schedule.  Start 1 tablet daily for one week, then 2 tablets daily for one week, then 3 tablets daily for one week.  At that point, call the clinic and we can prescribe a larger single dose tablet of 28mg , to be taken daily.  Side effects include dizziness, headache, diarrhea or constipation.  Call with any questions or concerns. 2.  Continue galantamine 3.  We will get a physical therapist to come by the home. 4.  Follow up in 6 months.

## 2014-03-07 NOTE — Progress Notes (Signed)
NEUROLOGY FOLLOW UP OFFICE NOTE  [name] [id number]  HISTORY OF PRESENT ILLNESS: Julia [name] is a 79 year old woman with HTN, hyperlipidemia, anxiety and thyroid disease, who returns for amnestic mild cognitive impairment.  She is accompanied by her daughters.    UPDATE: She currently takes galantamine ER 16mg  daily.  She is tolerating it well.  Her children had a family friend come by twice a month to help with chores involving heavy lifting, such as using the vaccuum cleaner.  There was resistance, first by her husband due to cost and later by her because of stubbornness.  The children feel that their father doesn't realize the seriousness of her illness and hasn't been totally supportive in accepting help.  HISTORY: Onset of symptoms began 4 years ago, starting with misplacing things such as her keys, and would often repeat questions. She is particularly having short-term memory problems. She frequently repeats questions and forgets why she walked into a room.  She does not forget faces.  She still pays the bills, but with the guidance of her daughter, because she sometimes forgets about some bills.  She has a long-standing history of anxiety, which got worse after the passing of her brother and later one of her daughters.  She occasionally is confused but doesn't really become disoriented when driving. She rarely drives and only during the day to the hair salon or occasionally [facility]. She is able to perform all her ADLs, such as bathing, dressing and feeding herself.  Her husband is disabled and she takes care of most of the house chores.  She does the laundry, washes the dishes and cleans the house.  She has been more unsteady and weak in her legs.  She had a couple of falls while carrying heavy loads of trash outside on uneven ground.  She has an exercise bike which she does not use.  She says she sleeps well.  MRI of the brain from 12/04/12 was reviewed and revealed mild to  moderate atrophy with moderately extensive small vessel ischemic changes and small chronic lacunar infarct.  B12 level was 301.  MMSE score from 09/05/13 was 28/30.  She underwent neuropsychological testing in August-September 2014, which did reveal mild cognitive impairment, somewhat complicated by periods of depression and anxiety.  PAST MEDICAL HISTORY: Past Medical History  Diagnosis Date  . GERD (gastroesophageal reflux disease)   . Hypertension   . Hyperlipidemia   . Osteopenia   . History of colonic polyps   . Right forearm fracture 2011  . Anxiety   . Thyroid disease     MEDICATIONS: Current Outpatient Prescriptions on File Prior to Visit  Medication Sig Dispense Refill  . acetaminophen (TYLENOL) 500 MG tablet Take 500 mg by mouth at bedtime as needed for pain.      Marland Kitchen ALPRAZolam (XANAX) 0.25 MG tablet Take 1 tablet (0.25 mg total) by mouth at bedtime as needed for anxiety.  60 tablet  0  . aspirin 81 MG tablet Take 162 mg by mouth daily.       . Calcium Carbonate-Vitamin D (CALCIUM-VITAMIN D) 500-200 MG-UNIT per tablet Take 1 tablet by mouth 2 (two) times daily with a meal.      . Fish Oil OIL by Does not apply route.      . galantamine (RAZADYNE ER) 16 MG 24 hr capsule take 1 capsule by mouth once daily WITH BREAKFAST  30 capsule  2  . Ibuprofen 200 MG CAPS Take 200 mg  by mouth 2 (two) times daily.       Marland Kitchen imipramine (TOFRANIL) 50 MG tablet TAKE ONE TABLET BY MOUTH EVERY DAY AT NOON AND TWO IN THE EVENING  270 tablet  3  . levothyroxine (SYNTHROID, LEVOTHROID) 88 MCG tablet take 1 tablet by mouth once daily ON AN EMPTY STOMACH  90 tablet  1  . nadolol (CORGARD) 40 MG tablet TAKE ONE TABLET BY MOUTH ONCE DAILY  30 tablet  5  . sertraline (ZOLOFT) 50 MG tablet Take one tablet (50 mg total) by mouth daily.  30 tablet  6  . solifenacin (VESICARE) 5 MG tablet Take 5 mg by mouth daily.      . rosuvastatin (CRESTOR) 20 MG tablet Take 1 tablet (20 mg total) by mouth once a week.  90  tablet  3   No current facility-administered medications on file prior to visit.    ALLERGIES: Allergies  Allergen Reactions  . Entex Other (See Comments)    unknown  . Sulfonamide Derivatives Other (See Comments)    unknown    FAMILY HISTORY: Family History  Problem Relation Age of Onset  . Lung cancer    . Hypertension Mother   . COPD Mother   . Cancer Father   . Stomach cancer Father   . Heart disease Brother   . Alzheimer's disease Maternal Aunt   . Drug abuse Maternal Uncle   . Cancer Maternal Uncle   . Colon cancer Neg Hx   . Esophageal cancer Neg Hx   . Rectal cancer Neg Hx     SOCIAL HISTORY: History   Social History  . Marital Status: Married    Spouse Name: N/A    Number of Children: N/A  . Years of Education: N/A   Occupational History  . retired    Social History Main Topics  . Smoking status: Never Smoker   . Smokeless tobacco: Never Used  . Alcohol Use: No  . Drug Use: No  . Sexual Activity: Not on file   Other Topics Concern  . Not on file   Social History Narrative  . No narrative on file    REVIEW OF SYSTEMS: Constitutional: No fevers, chills, or sweats, no generalized fatigue, change in appetite Eyes: No visual changes, double vision, eye pain Ear, nose and throat: No hearing loss, ear pain, nasal congestion, sore throat Cardiovascular: No chest pain, palpitations Respiratory:  No shortness of breath at rest or with exertion, wheezes GastrointestinaI: No nausea, vomiting, diarrhea, abdominal pain, fecal incontinence Genitourinary:  No dysuria, urinary retention or frequency Musculoskeletal:  No neck pain, back pain Integumentary: No rash, pruritus, skin lesions Neurological: as above Psychiatric: anxiety Endocrine: No palpitations, fatigue, diaphoresis, mood swings, change in appetite, change in weight, increased thirst Hematologic/Lymphatic:  No anemia, purpura, petechiae. Allergic/Immunologic: no itchy/runny eyes, nasal  congestion, recent allergic reactions, rashes  PHYSICAL EXAM: Filed Vitals:   03/07/14 1109  BP: 122/84  Pulse: 76  Resp: 16   General: No acute distress Head:  Normocephalic/atraumatic Neck: supple, no paraspinal tenderness, full range of motion Heart:  Regular rate and rhythm Lungs:  Clear to auscultation bilaterally Back: No paraspinal tenderness Neurological Exam: alert and oriented to person, place, and time. Attention span and concentration shows some impairment, recent memory poor and remote memory intact, fund of knowledge intact.   Montreal Cognitive Assessment  03/07/2014  Visuospatial/ Executive (0/5) 1  Naming (0/3) 3  Attention: Read list of digits (0/2) 2  Attention: Read list of  letters (0/1) 0  Attention: Serial 7 subtraction starting at 100 (0/3) 0  Language: Repeat phrase (0/2) 1  Language : Fluency (0/1) 0  Abstraction (0/2) 1  Delayed Recall (0/5) 0  Orientation (0/6) 5  Total 13  Adjusted Score (based on education) 14  Speech fluent and not dysarthric. CN II-XII intact. Fundi not visualized. Bulk and tone normal, muscle strength 5/5 throughout. Sensation to light touch intact. Deep tendon reflexes 2+ throughout. Finger to nose intact. Gait normal.  IMPRESSION: Alzheimer's dementia Generalized anxiety disorder Depression  PLAN: 1.  Will start memantine XR and titrate to 28mg  daily 2.  Continue galantamine 16mg   3.  Home PT 4.  Encouraged help around the house.  Patient should not be carrying heavy objects due to falls. 5.  Zoloft 50mg  6.  Follow up in 6 months.  60 minutes spent with patient and family, over 50% spent discussing in detail the diagnosis, prognosis and appropriate plan.  Metta Clines, DO  CC:  Garret Reddish, MD

## 2014-03-11 ENCOUNTER — Ambulatory Visit: Payer: Self-pay | Admitting: Family Medicine

## 2014-03-18 ENCOUNTER — Other Ambulatory Visit: Payer: Self-pay | Admitting: Neurology

## 2014-03-27 ENCOUNTER — Other Ambulatory Visit: Payer: Self-pay | Admitting: *Deleted

## 2014-03-31 ENCOUNTER — Telehealth: Payer: Self-pay | Admitting: Neurology

## 2014-03-31 ENCOUNTER — Other Ambulatory Visit: Payer: Self-pay | Admitting: *Deleted

## 2014-03-31 MED ORDER — MEMANTINE HCL ER 28 MG PO CP24: 28.0000 mg | ORAL_CAPSULE | Freq: Every day | ORAL | Status: DC

## 2014-03-31 NOTE — Telephone Encounter (Signed)
Patient Daughter called and states that the drug store needs a refill please call [place]

## 2014-03-31 NOTE — Telephone Encounter (Signed)
RX has been sent by computer to pharmacy daughter Kenney Houseman is aware

## 2014-04-08 ENCOUNTER — Other Ambulatory Visit: Payer: Self-pay | Admitting: Neurology

## 2014-04-15 ENCOUNTER — Telehealth: Payer: Self-pay | Admitting: *Deleted

## 2014-04-15 ENCOUNTER — Telehealth: Payer: Self-pay | Admitting: Neurology

## 2014-04-15 NOTE — Telephone Encounter (Signed)
Pt daughter Kenney Houseman called and states that her mother refill on galantamine is not at the drug store they are still waiting on approval from [place] from last week she states please call her at [phone] and she is out of medication

## 2014-04-15 NOTE — Telephone Encounter (Signed)
Medication was picked up on 04/08/14 per Pharmacy  I spoke with Neale Burly she is not sure what Kenney Houseman is talking about as far as medication . She has also ask to speak with Dr Tomi Likens regarding her mother I told her I would give her number to him so that he could call her back

## 2014-04-16 ENCOUNTER — Other Ambulatory Visit: Payer: Self-pay | Admitting: Neurology

## 2014-04-18 ENCOUNTER — Telehealth: Payer: Self-pay | Admitting: Neurology

## 2014-04-18 NOTE — Telephone Encounter (Signed)
Patient 's daughter is aware that a family visit has been requested

## 2014-04-18 NOTE — Telephone Encounter (Signed)
Marcie Bal, pt's daughter would like to speak to a nurse to ask a questions regarding pt's upcoming f/u appt. C/B [phone]

## 2014-04-18 NOTE — Telephone Encounter (Signed)
Please call Neale Burly back at [phone] she would like to talk to you about what you guys talked about today

## 2014-04-18 NOTE — Telephone Encounter (Signed)
error 

## 2014-04-18 NOTE — Telephone Encounter (Signed)
Patients daughter Marcie Bal was advised to bring patient to family meeting with Dr Tomi Likens

## 2014-04-18 NOTE — Telephone Encounter (Signed)
Julia [name], pt's daughter called wanting to speak to a nurse regarding pt's appointment.  Please call her back # [phone]

## 2014-04-21 ENCOUNTER — Encounter: Payer: Self-pay | Admitting: Family Medicine

## 2014-04-21 ENCOUNTER — Ambulatory Visit (INDEPENDENT_AMBULATORY_CARE_PROVIDER_SITE_OTHER): Payer: Medicare Other | Admitting: Family Medicine

## 2014-04-21 DIAGNOSIS — F0280 Dementia in other diseases classified elsewhere without behavioral disturbance: Secondary | ICD-10-CM

## 2014-04-21 DIAGNOSIS — G309 Alzheimer's disease, unspecified: Secondary | ICD-10-CM

## 2014-04-21 DIAGNOSIS — E785 Hyperlipidemia, unspecified: Secondary | ICD-10-CM

## 2014-04-21 DIAGNOSIS — N3281 Overactive bladder: Secondary | ICD-10-CM | POA: Insufficient documentation

## 2014-04-21 DIAGNOSIS — E039 Hypothyroidism, unspecified: Secondary | ICD-10-CM

## 2014-04-21 MED ORDER — IMIPRAMINE HCL 25 MG PO TABS: 25.0000 mg | ORAL_TABLET | Freq: Every day | ORAL | Status: DC

## 2014-04-21 MED ORDER — IMIPRAMINE HCL 50 MG PO TABS: ORAL_TABLET | ORAL | Status: DC

## 2014-04-21 NOTE — Telephone Encounter (Signed)
Daughter states they will keep the appt  05/14/14 with family

## 2014-04-21 NOTE — Assessment & Plan Note (Signed)
Well-controlled on levothyroxine 88 mcg. We will recheck in 2 months.

## 2014-04-21 NOTE — Patient Instructions (Signed)
Let's go down to 75 mg of imipramine. Let's check in by phone in about 4 weeks and consider going down to 50mg .   Let's check in 2 months from now. We will update thyroid labs and kidney function labs.   I think it is a great idea to have a discussion as a family about resuscitation/DNR.

## 2014-04-21 NOTE — Assessment & Plan Note (Signed)
Previously with reasonable control on once weekly Crestor 20 mg. Given there is some question about memory loss on statin do not increase this medicine. In addition given likelihood long-term decline over next 10 years statin benefit really is unclear. We could consider in the future discussing d/c medication.

## 2014-04-21 NOTE — Progress Notes (Signed)
Julia Reddish, MD Phone: [phone]  Subjective:  Patient presents today to establish care with me as their new primary care provider. Patient was formerly a patient of Dr. Arnoldo Morale. Chief complaint-noted.   Alzheimer's Dementia Confusion/memory loss Followed by neurology. They have been trying to work patient off of contact anticholinergic medications. She has been changed from vesicare to [facility]. Patient has been resistant to coming off of imipramine as it helps her sleep.. Does appear she used to be on 150 mg and now only 100 mg at night. Also with history of falls I inquired about patient's use of Xanax. She uses maximum about once a month and is not using her sleep. Patient does feel like she is a burden on her family and her 2 daughters who are present reassure her that she is not. ROS- more tired on high dose namenda and some leg tingling. Patient desires to come off of the Namenda as a result. No SI HI.   Hypothyroidism-well controlled On thyroid medication-levothyroxine 88 mcg ROS-No hair or nail changes. No heat/cold intolerance. No constipation or diarrhea. Does have some anxiety.   Hyperlipidemia-very mild poor control  Lab Results  Component Value Date   LDLCALC 101* 04/03/2013  On statin: Crestor 20mg  once a week Regular exercise: family trying to get her to walk ROS- no chest pain or shortness of breath. No myalgias  The following were reviewed and entered/updated in epic: Past Medical History  Diagnosis Date  . GERD (gastroesophageal reflux disease)   . Hypertension   . Hyperlipidemia   . Osteopenia   . History of colonic polyps   . Right forearm fracture 2011  . Anxiety   . Thyroid disease   . SEBACEOUS CYST, INFECTED 03/16/2009    Qualifier: Diagnosis of  By: Arnoldo Morale MD, [place] with no loss of consciousness 05/14/2008    Qualifier: Diagnosis of  By: Arnoldo Morale MD, Balinda Quails    Patient Active Problem List   Diagnosis Date Noted  . Alzheimer's  dementia 04/21/2014    Priority: High  . Hypothyroid 11/21/2013    Priority: Medium  . Hyperlipidemia 04/18/2007    Priority: Medium  . Essential hypertension 12/11/2006    Priority: Medium  . Overactive bladder 04/21/2014    Priority: Low  . PALPITATIONS, RECURRENT 12/09/2008    Priority: Low  . GERD 12/11/2006    Priority: Low  . Osteopenia 12/11/2006    Priority: Low  . History of colonic polyps 12/11/2006    Priority: Low   Past Surgical History  Procedure Laterality Date  . Rotator cuff repair      right shoulder  . Bladder tack    . Tonsillectomy    . Bilateral salpingoophorectomy    . Surgical repair to fx left wrist    . Abdominal hysterectomy  2009    Family History  Problem Relation Age of Onset  . Lung cancer    . Hypertension Mother   . COPD Mother   . Cancer Father   . Stomach cancer Father   . Heart disease Brother   . Alzheimer's disease Maternal Aunt   . Drug abuse Maternal Uncle   . Cancer Maternal Uncle   . Colon cancer Neg Hx   . Esophageal cancer Neg Hx   . Rectal cancer Neg Hx     Medications- reviewed and updated Current Outpatient Prescriptions  Medication Sig Dispense Refill  . ALPRAZolam (XANAX) 0.25 MG tablet Take 1 tablet (0.25 mg total) by  mouth at bedtime as needed for anxiety. (Patient taking differently: Take 0.25 mg by mouth daily as needed for anxiety. ) 60 tablet 0  . aspirin 81 MG tablet Take 162 mg by mouth daily.     . Fish Oil OIL by Does not apply route.    . galantamine (RAZADYNE ER) 16 MG 24 hr capsule TAKE 1 CAPSULE BY MOUTH ONCE DAILY WITH BREAKFAST 30 capsule 2  . Ibuprofen 200 MG CAPS Take 200 mg by mouth 2 (two) times daily.     Marland Kitchen imipramine (TOFRANIL) 50 MG tablet TAKE ONE Tablet in evening along with 25mg  (75 mg total for now) 270 tablet 3  . levothyroxine (SYNTHROID, LEVOTHROID) 88 MCG tablet take 1 tablet by mouth once daily ON AN EMPTY STOMACH 90 tablet 1  . Memantine HCl ER 28 MG CP24 Take 28 mg by mouth.      . mirabegron ER (MYRBETRIQ) 25 MG TB24 tablet Take 25 mg by mouth daily.    . nadolol (CORGARD) 40 MG tablet TAKE ONE TABLET BY MOUTH ONCE DAILY 30 tablet 5  . rosuvastatin (CRESTOR) 20 MG tablet Take 1 tablet (20 mg total) by mouth once a week. 90 tablet 3  . sertraline (ZOLOFT) 50 MG tablet Take one tablet (50 mg total) by mouth daily. 30 tablet 6  . imipramine (TOFRANIL) 25 MG tablet Take 1 tablet (25 mg total) by mouth at bedtime. Take in addition to 50mg  tablet. 30 tablet 1   No current facility-administered medications for this visit.    Allergies-reviewed and updated Allergies  Allergen Reactions  . Entex Other (See Comments)    unknown  . Sulfonamide Derivatives Other (See Comments)    unknown    History   Social History  . Marital Status: Married    Spouse Name: N/A    Number of Children: N/A  . Years of Education: N/A   Occupational History  . retired    Social History Main Topics  . Smoking status: Never Smoker   . Smokeless tobacco: Never Used  . Alcohol Use: No  . Drug Use: No  . Sexual Activity: None   Other Topics Concern  . None   Social History Narrative   Married to husband Ardyth Gal and lives in [place]. 2 daughters live in [place], 1 son in [place] and 1 in [place].       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       Hobbies: tv, caring for cat (very important to her), change is difficult even with type of tv      Jake Samples (daughter) Chauncey Reading [phone]   Full Code    ROS--See HPI   Objective: BP 112/70 mmHg  Pulse 68  Temp(Src) 97.6 F (36.4 C)  Wt 122 lb (55.339 kg) Gen: NAD, resting comfortably on table, oriented to family, reason for visit Neck: no thyromegaly CV: RRR no murmurs rubs or gallops Lungs: CTAB no crackles, wheeze, rhonchi Abdomen: soft/nontender/nondistended/normal bowel sounds.  Ext: no edema, 2+ PT pulses Skin: warm, dry, no rash  Neuro: grossly normal, moves all extremities    Assessment/Plan:  Alzheimer's dementia Namenda and galantamine per Dr. Tomi Likens.  We focused on removing anticholinergics to avoid any counteraction to current alzheimer medications. Trial imipramine 75 mg down from 100mg  with 1 month phone call to check in with plan to go to 50mg  and see each other in 2 months.  I also had a long discussion with family about advanced directives and  having a conversation with mother about resuscitation before her mental status inevitably continues to decline. I started this conversation with them today but asked for it to be a whole family discussion given close ties in family.   Hypothyroid Well-controlled on levothyroxine 88 mcg. We will recheck in 2 months.  Hyperlipidemia Previously with reasonable control on once weekly Crestor 20 mg. Given there is some question about memory loss on statin do not increase this medicine. In addition given likelihood long-term decline over next 10 years statin benefit really is unclear. We could consider in the future discussing d/c medication.   >50% of 40 minute office visit was spent on counseling (resuscitation discussions, course of alzheimers, benefits/risks of medications and adjustments) and coordination of care  2 month follow up    Meds ordered this encounter  Medications  . mirabegron ER (MYRBETRIQ) 25 MG TB24 tablet    Sig: Take 25 mg by mouth daily.  . Memantine HCl ER 28 MG CP24    Sig: Take 28 mg by mouth.  Marland Kitchen imipramine (TOFRANIL) 25 MG tablet    Sig: Take 1 tablet (25 mg total) by mouth at bedtime. Take in addition to 50mg  tablet.    Dispense:  30 tablet    Refill:  1  . imipramine (TOFRANIL) 50 MG tablet    Sig: TAKE ONE Tablet in evening along with 25mg  (75 mg total for now)    Dispense:  270 tablet    Refill:  3

## 2014-04-21 NOTE — Telephone Encounter (Signed)
Pt's daughter, Neale Burly has called this morning. Please call back at [phone] / Sherri S.

## 2014-04-21 NOTE — Assessment & Plan Note (Signed)
Namenda and galantamine per Dr. Tomi Likens.  We focused on removing anticholinergics to avoid any counteraction to current alzheimer medications. Trial imipramine 75 mg down from 100mg  with 1 month phone call to check in with plan to go to 50mg  and see each other in 2 months.  I also had a long discussion with family about advanced directives and having a conversation with mother about resuscitation before her mental status inevitably continues to decline. I started this conversation with them today but asked for it to be a whole family discussion given close ties in family.

## 2014-04-23 ENCOUNTER — Emergency Department (INDEPENDENT_AMBULATORY_CARE_PROVIDER_SITE_OTHER): Payer: 59

## 2014-04-23 ENCOUNTER — Emergency Department (HOSPITAL_COMMUNITY)
Admission: EM | Admit: 2014-04-23 | Discharge: 2014-04-23 | Disposition: A | Discharge status: Home / Self Care | Payer: Medicare Other | Source: Home / Self Care | Attending: Family Medicine | Admitting: Family Medicine

## 2014-04-23 ENCOUNTER — Encounter (HOSPITAL_COMMUNITY): Payer: Self-pay | Admitting: Emergency Medicine

## 2014-04-23 DIAGNOSIS — S99912A Unspecified injury of left ankle, initial encounter: Secondary | ICD-10-CM

## 2014-04-23 DIAGNOSIS — S99919A Unspecified injury of unspecified ankle, initial encounter: Secondary | ICD-10-CM

## 2014-04-23 NOTE — Discharge Instructions (Signed)
Wear ankle support as needed for comfort, activity as tolerated. advil  as needed,ice for 3d then warm soaks for soreness and swelling  return or see orthopedist if further problems.

## 2014-04-23 NOTE — ED Provider Notes (Signed)
CSN: [id number]     Arrival date & time 04/23/14  1440 History   First MD Initiated Contact with Patient 04/23/14 1550     Chief Complaint  Patient presents with  . Ankle Injury   (Consider location/radiation/quality/duration/timing/severity/associated sxs/prior Treatment) Patient is a 79 y.o. female presenting with lower extremity injury. The history is provided by the patient and a relative.  Ankle Injury This is a new problem. The current episode started 12 to 24 hours ago (twisted left ankle getting out of shower last eve, fell, no other c/o or problems noted.). The problem has not changed since onset.   Past Medical History  Diagnosis Date  . GERD (gastroesophageal reflux disease)   . Hypertension   . Hyperlipidemia   . Osteopenia   . History of colonic polyps   . Right forearm fracture 2011  . Anxiety   . Thyroid disease   . SEBACEOUS CYST, INFECTED 03/16/2009    Qualifier: Diagnosis of  By: Arnoldo Morale MD, [place] with no loss of consciousness 05/14/2008    Qualifier: Diagnosis of  By: Arnoldo Morale MD, Balinda Quails    Past Surgical History  Procedure Laterality Date  . Rotator cuff repair      right shoulder  . Bladder tack    . Tonsillectomy    . Bilateral salpingoophorectomy    . Surgical repair to fx left wrist    . Abdominal hysterectomy  2009   Family History  Problem Relation Age of Onset  . Lung cancer    . Hypertension Mother   . COPD Mother   . Cancer Father   . Stomach cancer Father   . Heart disease Brother   . Alzheimer's disease Maternal Aunt   . Drug abuse Maternal Uncle   . Cancer Maternal Uncle   . Colon cancer Neg Hx   . Esophageal cancer Neg Hx   . Rectal cancer Neg Hx    History  Substance Use Topics  . Smoking status: Never Smoker   . Smokeless tobacco: Never Used  . Alcohol Use: No   OB History    No data available     Review of Systems  Constitutional: Negative.   Musculoskeletal: Positive for joint swelling. Negative for  gait problem.  Skin: Negative.     Allergies  Entex and Sulfonamide derivatives  Home Medications   Prior to Admission medications   Medication Sig Start Date End Date Taking? Authorizing Provider  ALPRAZolam (XANAX) 0.25 MG tablet Take 1 tablet (0.25 mg total) by mouth at bedtime as needed for anxiety. Patient taking differently: Take 0.25 mg by mouth daily as needed for anxiety.  02/25/14  Yes Marin Olp, MD  aspirin 81 MG tablet Take 162 mg by mouth daily.    Yes Historical Provider, MD  Fish Oil OIL by Does not apply route.   Yes Historical Provider, MD  galantamine (RAZADYNE ER) 16 MG 24 hr capsule TAKE 1 CAPSULE BY MOUTH ONCE DAILY WITH BREAKFAST 04/08/14  Yes Adam Melvern Sample, DO  Ibuprofen 200 MG CAPS Take 200 mg by mouth 2 (two) times daily.    Yes Historical Provider, MD  imipramine (TOFRANIL) 25 MG tablet Take 1 tablet (25 mg total) by mouth at bedtime. Take in addition to 50mg  tablet. 04/21/14  Yes Marin Olp, MD  levothyroxine (SYNTHROID, LEVOTHROID) 88 MCG tablet take 1 tablet by mouth once daily ON AN EMPTY STOMACH 11/06/13  Yes Ricard Dillon, MD  Memantine HCl  ER 28 MG CP24 Take 28 mg by mouth.   Yes Historical Provider, MD  mirabegron ER (MYRBETRIQ) 25 MG TB24 tablet Take 25 mg by mouth daily.   Yes Historical Provider, MD  nadolol (CORGARD) 40 MG tablet TAKE ONE TABLET BY MOUTH ONCE DAILY   Yes Ricard Dillon, MD  rosuvastatin (CRESTOR) 20 MG tablet Take 1 tablet (20 mg total) by mouth once a week. 07/02/12  Yes Ricard Dillon, MD  sertraline (ZOLOFT) 50 MG tablet Take one tablet (50 mg total) by mouth daily. 04/03/13  Yes Ricard Dillon, MD  imipramine (TOFRANIL) 50 MG tablet TAKE ONE Tablet in evening along with 25mg  (75 mg total for now) 04/21/14   Marin Olp, MD   BP 142/73 mmHg  Temp(Src) 96.4 F (35.8 C) (Oral)  Resp 18  SpO2 98% Physical Exam  Constitutional: She is oriented to person, place, and time. She appears well-developed and  well-nourished.  Musculoskeletal:       Left ankle: She exhibits swelling. She exhibits no ecchymosis, no deformity and normal pulse. Tenderness. Lateral malleolus tenderness found. No medial malleolus, no head of 5th metatarsal and no proximal fibula tenderness found. Achilles tendon normal.  Neurological: She is alert and oriented to person, place, and time.  Skin: Skin is warm and dry.  Nursing note and vitals reviewed.   ED Course  Procedures (including critical care time) Labs Review Labs Reviewed - No data to display  Imaging Review Dg Ankle Complete Left  04/23/2014   CLINICAL DATA:  Patient slipped and fell in the bathroom at home last night left ankle pain, initial evaluation  EXAM: LEFT ANKLE COMPLETE - 3+ VIEW  COMPARISON:  None.  FINDINGS: There is severe lateral soft tissue swelling. There is a nondisplaced hairline fracture through the distal tip of the lateral malleolus. The mortise is intact. No other fractures are appreciated. There is a small ankle joint effusion.  IMPRESSION: Hairline fracture lateral malleolus   Electronically Signed   By: Skipper Cliche M.D.   On: 04/23/2014 16:22     MDM   1. Ankle injury        Billy Fischer, MD 04/23/14 [phone]

## 2014-04-23 NOTE — ED Notes (Signed)
Reports she fell last night and twisted her left ankle getting out of the shower Sx include swelling of the lateral malleolus and tender Steady gait; alert, no signs of acute distress.  Family in the room w/pt

## 2014-05-07 ENCOUNTER — Telehealth: Payer: Self-pay | Admitting: Family Medicine

## 2014-05-07 MED ORDER — LEVOTHYROXINE SODIUM 88 MCG PO TABS: 88.0000 ug | ORAL_TABLET | Freq: Every day | ORAL | Status: DC

## 2014-05-07 NOTE — Telephone Encounter (Signed)
Medication sent into [facility]

## 2014-05-07 NOTE — Telephone Encounter (Signed)
Patient's daughter called stating patient needs a re-fill on levothyroxine (SYNTHROID, LEVOTHROID) 88 MCG tablet. She has 1 pill left. [facility] [address], [place]

## 2014-05-12 ENCOUNTER — Telehealth: Payer: Self-pay | Admitting: Neurology

## 2014-05-12 NOTE — Telephone Encounter (Signed)
Pt canceled her 05/14/14 f/u appt and would like to keep the f/u on 08/13/14.

## 2014-05-14 ENCOUNTER — Ambulatory Visit: Payer: Self-pay | Admitting: Neurology

## 2014-05-24 ENCOUNTER — Other Ambulatory Visit: Payer: Self-pay | Admitting: Neurology

## 2014-06-02 ENCOUNTER — Other Ambulatory Visit: Payer: Self-pay | Admitting: Neurology

## 2014-06-04 ENCOUNTER — Telehealth: Payer: Self-pay | Admitting: *Deleted

## 2014-06-04 NOTE — Telephone Encounter (Signed)
Daughter calling about no medication at pharmacy I called medication zoloft in on 06/02/14 per pharmacy it is being picked up to soon daughter is aware insurance wil lnot approve it early

## 2014-06-13 ENCOUNTER — Other Ambulatory Visit: Payer: Self-pay | Admitting: *Deleted

## 2014-06-13 MED ORDER — NADOLOL 40 MG PO TABS: 40.0000 mg | ORAL_TABLET | Freq: Every day | ORAL | Status: DC

## 2014-06-13 NOTE — Addendum Note (Signed)
Addended by: Townsend Roger D on: 06/13/2014 04:32 PM   Modules accepted: Orders

## 2014-07-02 ENCOUNTER — Other Ambulatory Visit: Payer: Self-pay | Admitting: *Deleted

## 2014-07-02 MED ORDER — IMIPRAMINE HCL 50 MG PO TABS: ORAL_TABLET | ORAL | Status: DC

## 2014-07-02 MED ORDER — IMIPRAMINE HCL 25 MG PO TABS: 25.0000 mg | ORAL_TABLET | Freq: Every day | ORAL | Status: DC

## 2014-07-08 ENCOUNTER — Encounter: Payer: Self-pay | Admitting: Family Medicine

## 2014-07-08 ENCOUNTER — Ambulatory Visit (INDEPENDENT_AMBULATORY_CARE_PROVIDER_SITE_OTHER): Payer: 59 | Admitting: Family Medicine

## 2014-07-08 VITALS — BP 140/60 | Temp 97.4°F | Wt 123.0 lb

## 2014-07-08 DIAGNOSIS — E785 Hyperlipidemia, unspecified: Secondary | ICD-10-CM

## 2014-07-08 DIAGNOSIS — G309 Alzheimer's disease, unspecified: Secondary | ICD-10-CM

## 2014-07-08 DIAGNOSIS — F0280 Dementia in other diseases classified elsewhere without behavioral disturbance: Secondary | ICD-10-CM

## 2014-07-08 DIAGNOSIS — R202 Paresthesia of skin: Secondary | ICD-10-CM

## 2014-07-08 LAB — TSH: TSH: 1.09 u[IU]/mL (ref 0.35–4.50)

## 2014-07-08 LAB — VITAMIN B12: Vitamin B-12: 327 pg/mL (ref 211–911)

## 2014-07-08 LAB — HEMOGLOBIN A1C: HEMOGLOBIN A1C: 6.2 % (ref 4.6–6.5)

## 2014-07-08 MED ORDER — TRAZODONE HCL 50 MG PO TABS: 25.0000 mg | ORAL_TABLET | Freq: Every day | ORAL | Status: DC

## 2014-07-08 NOTE — Patient Instructions (Addendum)
Trimmed callous off the toe  Continue imipramine 75 mg, add 25 mg trazodone over next 2 weeks. If this helps, may trial 50mg  imipramine and 50 mg trazodone in 2 weeks. See me in 1 month  Tingling in feet unclear cause. Has good pulses  Basic lab tests to evaluate tingling  From last visit I think it is a great idea to have a discussion as a family about resuscitation/DNR.  Would love to discuss more at follow up depending on # of issues we need to cover.   Most worrisome risk from interaction checker is below. This is a risk with zoloft, imipramine, and trazodone combined. That's why we are going to try to come down on imipramine.  Serotonin Syndrome Serotonin is a brain chemical that regulates the nervous system. Some kinds of drugs increase the amount of serotonin in your body. Drugs that increase the serotonin in your body include:   Anti-depressant medications.  St. John's wort.  Recreational drugs.  Migraine medicines.  Some pain medicines. SYMPTOMS Combining these drugs increases the risk that you will become ill with a toxic condition called serotonin syndrome.  Symptoms of too much serotonin include:  Confusion.  Agitation.  Weakness.  Insomnia.  Fever.  Sweats. Other symptoms that may develop include:  Shakiness.  Muscle spasms.  Seizures. TREATMENT  Hospital treatment is often needed until the effects are controlled.  Avoiding the combination of medicines listed above is recommended.  Check with your doctor if you are concerned about your medicine or the side effects. Document Released: 06/23/2004 Document Revised: 08/08/2011 Document Reviewed: 05/16/2005 [facility] Patient Information 2015 [place], [place]. This information is not intended to replace advice given to you by your health care provider. Make sure you discuss any questions you have with your health care provider.

## 2014-07-08 NOTE — Progress Notes (Signed)
Garret Reddish, MD Phone: [phone]  Subjective:   Julia [name] is a 80 y.o. year old very pleasant female patient who presents with the following:  Alzheimer's dementia Insomnia Attempting to wean anticholinergics. 11/23 imipramine to 75mg , plan to 50mg  by phone. Patient has had several nights up to 3 am with decrease and never tried 40mg . On zoloft for depression/agitation. Also has sparing xanax.   Hyperlipidemia-consider decrease/stop crestor discussed. Family informed me of prior MRI showing remote CVA.   Paresthesia/tingling in bilateral soles of feet 6-12 months of tingling in foot. Gradual progression. No pain. No numbness. No history DM. No history b12 deficiency.   ROS- No SI/HI. Agitation at times and sometimes does not want to take medication.  No chest pain or shortness of breath. No leg weakness or fecal incontinence or saddle anesthesia.   Past Medical History- Patient Active Problem List   Diagnosis Date Noted  . Alzheimer's dementia 04/21/2014    Priority: High  . Hypothyroid 11/21/2013    Priority: Medium  . Hyperlipidemia 04/18/2007    Priority: Medium  . Essential hypertension 12/11/2006    Priority: Medium  . Tingling in extremities 07/08/2014    Priority: Low  . Overactive bladder 04/21/2014    Priority: Low  . PALPITATIONS, RECURRENT 12/09/2008    Priority: Low  . GERD 12/11/2006    Priority: Low  . Osteopenia 12/11/2006    Priority: Low  . History of colonic polyps 12/11/2006    Priority: Low   Medications- reviewed and updated Current Outpatient Prescriptions  Medication Sig Dispense Refill  . aspirin 81 MG tablet Take 162 mg by mouth daily.     . Fish Oil OIL by Does not apply route.    . galantamine (RAZADYNE ER) 16 MG 24 hr capsule TAKE 1 CAPSULE BY MOUTH ONCE DAILY WITH BREAKFAST 30 capsule 2  . Ibuprofen 200 MG CAPS Take 200 mg by mouth 2 (two) times daily.     Marland Kitchen imipramine (TOFRANIL) 25 MG tablet Take 1 tablet (25 mg total) by  mouth at bedtime. Take in addition to 50mg  tablet. 90 tablet 3  . imipramine (TOFRANIL) 50 MG tablet TAKE ONE Tablet in evening along with 25mg  (75 mg total for now) 90 tablet 3  . levothyroxine (SYNTHROID, LEVOTHROID) 88 MCG tablet Take 1 tablet (88 mcg total) by mouth daily before breakfast. 90 tablet 1  . Memantine HCl ER 28 MG CP24 Take 28 mg by mouth.    . mirabegron ER (MYRBETRIQ) 25 MG TB24 tablet Take 25 mg by mouth daily.    . nadolol (CORGARD) 40 MG tablet Take 1 tablet (40 mg total) by mouth daily. 30 tablet 11  . rosuvastatin (CRESTOR) 20 MG tablet Take 1 tablet (20 mg total) by mouth once a week. 90 tablet 3  . ALPRAZolam (XANAX) 0.25 MG tablet Take 1 tablet (0.25 mg total) by mouth at bedtime as needed for anxiety. (Patient not taking: Reported on 07/08/2014) 60 tablet 0  . sertraline (ZOLOFT) 50 MG tablet Take one tablet (50 mg total) by mouth daily. 30 tablet 6  . traZODone (DESYREL) 50 MG tablet Take 0.5-1 tablets (25-50 mg total) by mouth at bedtime. 30 tablet 2   No current facility-administered medications for this visit.    Objective: BP 140/60 mmHg  Temp(Src) 97.4 F (36.3 C)  Wt 123 lb (55.792 kg) Gen: NAD, resting comfortably on table CV: RRR no murmurs rubs or gallops Lungs: CTAB no crackles, wheeze, rhonchi Ext: no edema, 2+  PT and DP pulses MSK: 5/5 strength in lower extremities Skin: warm, dry Right 5th toe with callous forming as it pushes underneath 4th toe. Debrided 1 cm length callous.  Neuro: intact to monofilament, normal strength.   Assessment/Plan:  Alzheimer's dementia Still trying to reduce anticholinergics but making sleep much worse at times. We will trial trazodone 25 mg with imipramine 75mg  to avoid increased dose of antichoinergic. Trial 50mg  trazodone and 50mg  imipramine if first step successful, if not we will start imipramine back at 100mg  and stop trazodone. Discussed serotonin syndrome risk with zoloft, TCA, trazodone.     Hyperlipidemia Given for secondary prevention with history remove unknown CVA, continue crestor as well as asa.    Tingling in extremities Over about a year's times. No pain. Check labs b12, tsh, a1c. Do not think would benefit from medication. Consider asking for neurology's thoughts if progressive.     Return precautions advised. 1 month follow up.   Orders Placed This Encounter  Procedures  . Vitamin B12  . Hemoglobin A1c    [place]  . TSH    [place]   Meds ordered this encounter  Medications  . traZODone (DESYREL) 50 MG tablet    Sig: Take 0.5-1 tablets (25-50 mg total) by mouth at bedtime.    Dispense:  30 tablet    Refill:  2

## 2014-07-08 NOTE — Assessment & Plan Note (Signed)
Still trying to reduce anticholinergics but making sleep much worse at times. We will trial trazodone 25 mg with imipramine 75mg  to avoid increased dose of antichoinergic. Trial 50mg  trazodone and 50mg  imipramine if first step successful, if not we will start imipramine back at 100mg  and stop trazodone. Discussed serotonin syndrome risk with zoloft, TCA, trazodone.

## 2014-07-08 NOTE — Assessment & Plan Note (Addendum)
Over about a year's times. No pain. Check labs b12, tsh, a1c. Do not think would benefit from medication. Consider asking for neurology's thoughts if progressive.

## 2014-07-08 NOTE — Assessment & Plan Note (Signed)
Given for secondary prevention with history remove unknown CVA, continue crestor as well as asa.

## 2014-07-09 ENCOUNTER — Ambulatory Visit: Payer: 59 | Admitting: Podiatrist

## 2014-07-11 ENCOUNTER — Telehealth: Payer: Self-pay | Admitting: Family Medicine

## 2014-07-11 NOTE — Telephone Encounter (Signed)
Pt's daughter requesting call back regarding pt's recent  lab results.  Results were previously given to patient's husband and he is doesn't remember.

## 2014-07-11 NOTE — Telephone Encounter (Signed)
Left message on machine returning daughter's call. 

## 2014-07-16 ENCOUNTER — Telehealth: Payer: Self-pay | Admitting: Family Medicine

## 2014-07-16 MED ORDER — IMIPRAMINE HCL 50 MG PO TABS: ORAL_TABLET | ORAL | Status: DC

## 2014-07-16 NOTE — Telephone Encounter (Signed)
Stop trazodone. Start back on imipramine 100mg -2 of 50mg  (i ordered). Hold off on melatonin for now.   Keba-thanks for gathering additional data. Very helpful.

## 2014-07-16 NOTE — Telephone Encounter (Signed)
Pt daughter states the Trazadone is not working, her mom has not been sleeping and wants to know what the next step needs to be. She wants to know if they should add Melatonin and the nights that she dosent sleep well she is more emotional and wants to know if that could be in relation to her not sleeping. She has been taking the medicine at 9pm nightly but still no success.

## 2014-07-16 NOTE — Telephone Encounter (Signed)
Patient's daughter states Trazodone is not working and is requesting a callback on lab results.  She is requesting a callback.

## 2014-07-17 NOTE — Telephone Encounter (Signed)
Pt daughter notified   

## 2014-07-22 ENCOUNTER — Other Ambulatory Visit: Payer: Self-pay | Admitting: Neurology

## 2014-08-05 ENCOUNTER — Encounter: Payer: Self-pay | Admitting: Family Medicine

## 2014-08-05 ENCOUNTER — Ambulatory Visit (INDEPENDENT_AMBULATORY_CARE_PROVIDER_SITE_OTHER): Payer: 59 | Admitting: Family Medicine

## 2014-08-05 VITALS — BP 108/60 | HR 50 | Temp 97.6°F | Resp 16 | Ht 63.0 in | Wt 118.0 lb

## 2014-08-05 DIAGNOSIS — F0280 Dementia in other diseases classified elsewhere without behavioral disturbance: Secondary | ICD-10-CM | POA: Diagnosis not present

## 2014-08-05 DIAGNOSIS — G309 Alzheimer's disease, unspecified: Secondary | ICD-10-CM | POA: Diagnosis not present

## 2014-08-05 MED ORDER — TRAZODONE HCL 50 MG PO TABS: 50.0000 mg | ORAL_TABLET | Freq: Every evening | ORAL | Status: DC | PRN

## 2014-08-05 MED ORDER — IMIPRAMINE HCL 50 MG PO TABS: ORAL_TABLET | ORAL | Status: DC

## 2014-08-05 NOTE — Patient Instructions (Signed)
Let's trial 50mg  imipramine and 50mg  trazodone for sleep to balance potential fall risk with still needing sleep  Please discuss your thoughts with Dr. Tomi Likens about continuing care there. I think you may potentially benefit from his expertise. On the other hand, trying to balance quality of life with having fewer office visits/locations to  Travel to.   I am interested to hear what you all decide about driving and 24 hour supervision

## 2014-08-05 NOTE — Progress Notes (Signed)
Pre visit review using our clinic review tool, if applicable. No additional management support is needed unless otherwise documented below in the visit note. 

## 2014-08-05 NOTE — Progress Notes (Signed)
Garret Reddish, MD Phone: [phone]  Subjective:   Julia [name] is a 80 y.o. year old very pleasant female patient who presents with the following:  Alzheimer's Dementia Many questions today about whether I can care for alzheimer's, needs for health maintenance given this disease. Memory loss continues with slow gradual decline. They are considering not allowing patient to drive and if she needs 24 hour supervision  Insomnia Family patient discovered that difficult to sleep if emotional or upset. Since they have noted this and worked through some of the anxiety issues she has had an easier time sleeping. We had planned by phone to go back to 100mg  imipramine but eventually with taking 75mg  of imipramine and 25mg  of trazodone, was able to start resting better. Able to sleep now around 11 instead of 3 or 4 AM.  ROS-denies SI/HI or poorly controlled depression Past Medical History- Patient Active Problem List   Diagnosis Date Noted  . Alzheimer's dementia 04/21/2014    Priority: High  . Hypothyroid 11/21/2013    Priority: Medium  . Hyperlipidemia 04/18/2007    Priority: Medium  . Essential hypertension 12/11/2006    Priority: Medium  . Tingling in extremities 07/08/2014    Priority: Low  . Overactive bladder 04/21/2014    Priority: Low  . PALPITATIONS, RECURRENT 12/09/2008    Priority: Low  . GERD 12/11/2006    Priority: Low  . Osteopenia 12/11/2006    Priority: Low  . History of colonic polyps 12/11/2006    Priority: Low   Medications- reviewed and updated Current Outpatient Prescriptions  Medication Sig Dispense Refill  . ALPRAZolam (XANAX) 0.25 MG tablet Take 1 tablet (0.25 mg total) by mouth at bedtime as needed for anxiety. (Patient not taking: Reported on 07/08/2014) 60 tablet 0  . aspirin 81 MG tablet Take 162 mg by mouth daily.     . Fish Oil OIL by Does not apply route.    . galantamine (RAZADYNE ER) 16 MG 24 hr capsule TAKE 1 CAPSULE BY MOUTH ONCE DAILY WITH  BREAKFAST 30 capsule 2  . Ibuprofen 200 MG CAPS Take 200 mg by mouth 2 (two) times daily.     Marland Kitchen imipramine (TOFRANIL) 50 MG tablet Take 1 tablets in evening (50mg ) 90 tablet 3  . levothyroxine (SYNTHROID, LEVOTHROID) 88 MCG tablet Take 1 tablet (88 mcg total) by mouth daily before breakfast. 90 tablet 1  . Memantine HCl ER 28 MG CP24 Take 28 mg by mouth.    . mirabegron ER (MYRBETRIQ) 25 MG TB24 tablet Take 25 mg by mouth daily.    . nadolol (CORGARD) 40 MG tablet Take 1 tablet (40 mg total) by mouth daily. 30 tablet 11  . rosuvastatin (CRESTOR) 20 MG tablet Take 1 tablet (20 mg total) by mouth once a week. 90 tablet 3  . sertraline (ZOLOFT) 50 MG tablet Take one tablet (50 mg total) by mouth daily. 30 tablet 6  . traZODone (DESYREL) 50 MG tablet Take 1 tablet (50 mg total) by mouth at bedtime as needed for sleep. 30 tablet 3   No current facility-administered medications for this visit.    Objective: BP 108/60 mmHg  Pulse 50  Temp(Src) 97.6 F (36.4 C) (Oral)  Resp 16  Ht 5\' 3"  (1.6 m)  Wt 118 lb (53.524 kg)  BMI 20.91 kg/m2  SpO2 96% Gen: NAD, resting comfortably on table CV: RRR no murmurs rubs or gallops Lungs: CTAB no crackles, wheeze, rhonchi Abdomen: soft/nontender/nondistended/normal bowel sounds.  Ext: no edema  Skin: warm, dry, no rash Neuro: grossly normal, moves all extremities   Assessment/Plan:  Alzheimer's dementia Ultimately, I told patient and family I thought she could benefit from having Dr. Tomi Likens continue to provide care with his expertise. I certainly could manage patient and her dementia if needed but think meeting with him has added value.   I did discuss I though things like mammogram or colonoscopy would be low yield in regards to life expectancy and discouraged proceeding forward certainly for pure screening (could consider if needed for acute isues)  Finally for insomnia portion, has finally started sleeping on 75mg  imipramine and 25mg  trazodone and we  will attempt 50 imipramine and 50mg  trazodone to lower anticholinergic burden in patient with history of falls.     >50% of 35 minute office visit was spent on counseling (related to alzheimers, family dynamics of balancing safety and quality of life, decision on who will continue to provide care) and coordination of care  Meds ordered this encounter  Medications  . imipramine (TOFRANIL) 50 MG tablet    Sig: Take 1 tablets in evening (50mg )    Dispense:  90 tablet    Refill:  3  . traZODone (DESYREL) 50 MG tablet    Sig: Take 1 tablet (50 mg total) by mouth at bedtime as needed for sleep.    Dispense:  30 tablet    Refill:  3

## 2014-08-05 NOTE — Assessment & Plan Note (Signed)
Ultimately, I told patient and family I thought she could benefit from having Dr. Tomi Likens continue to provide care with his expertise. I certainly could manage patient and her dementia if needed but think meeting with him has added value.   I did discuss I though things like mammogram or colonoscopy would be low yield in regards to life expectancy and discouraged proceeding forward certainly for pure screening (could consider if needed for acute isues)  Finally for insomnia portion, has finally started sleeping on 75mg  imipramine and 25mg  trazodone and we will attempt 50 imipramine and 50mg  trazodone to lower anticholinergic burden in patient with history of falls.

## 2014-08-07 ENCOUNTER — Telehealth: Payer: Self-pay | Admitting: Neurology

## 2014-08-07 ENCOUNTER — Other Ambulatory Visit: Payer: Self-pay | Admitting: *Deleted

## 2014-08-07 DIAGNOSIS — R419 Unspecified symptoms and signs involving cognitive functions and awareness: Secondary | ICD-10-CM

## 2014-08-07 MED ORDER — SERTRALINE HCL 50 MG PO TABS: ORAL_TABLET | ORAL | Status: DC

## 2014-08-07 MED ORDER — MEMANTINE HCL ER 28 MG PO CP24: 28.0000 mg | ORAL_CAPSULE | Freq: Every day | ORAL | Status: DC

## 2014-08-07 NOTE — Telephone Encounter (Signed)
Mamenda and zoloft were e scribed to pharmacy

## 2014-08-07 NOTE — Telephone Encounter (Signed)
[name], pt's daughter called requesting a refill for Julia [name]: [facility] on [address]  C/b [phone]

## 2014-08-13 ENCOUNTER — Ambulatory Visit (INDEPENDENT_AMBULATORY_CARE_PROVIDER_SITE_OTHER): Payer: Medicare Other | Admitting: Neurology

## 2014-08-13 ENCOUNTER — Encounter: Payer: Self-pay | Admitting: Neurology

## 2014-08-13 VITALS — BP 128/60 | HR 60 | Resp 16 | Ht 63.0 in | Wt 119.1 lb

## 2014-08-13 DIAGNOSIS — F0280 Dementia in other diseases classified elsewhere without behavioral disturbance: Secondary | ICD-10-CM | POA: Diagnosis not present

## 2014-08-13 DIAGNOSIS — G309 Alzheimer's disease, unspecified: Secondary | ICD-10-CM

## 2014-08-13 DIAGNOSIS — F329 Major depressive disorder, single episode, unspecified: Secondary | ICD-10-CM

## 2014-08-13 DIAGNOSIS — F32A Depression, unspecified: Secondary | ICD-10-CM

## 2014-08-13 NOTE — Progress Notes (Signed)
NEUROLOGY FOLLOW UP OFFICE NOTE  Julia [name] [id number]  HISTORY OF PRESENT ILLNESS: Julia [name] is a 80 year old woman with HTN, hyperlipidemia, anxiety and thyroid disease, who returns for Alzheimer's dementia.  Records reviewed.  She is accompanied by her daughters who provide some history.    UPDATE: She currently takes galantamine ER 16mg  daily and was started on memantine XR.  She takes Zoloft 50mg  for depression.  She continues to live independently with her husband.  She tends to the her husband and the house work.  The house is kept clean.  Somebody comes to clean the house once a month.  She limits driving only to the hair salon.  Her daughters feel she has been doing better.  She is sleeping well.  Her mood is better.  Her daughter sets up her pillbox for the week.  She reads often and is able to recall articles she read to her daughter.  She also is able to remember that her granddaughter will be having a baby later this year.    HISTORY: Onset of symptoms began 4 years ago, starting with misplacing things such as her keys, and would often repeat questions. She is particularly having short-term memory problems. She frequently repeats questions and forgets why she walked into a room.  She does not forget faces.  She still pays the bills, but with the guidance of her daughter, because she sometimes forgets about some bills.  She has a long-standing history of anxiety, which got worse after the passing of her brother and later one of her daughters.  She occasionally is confused but doesn't really become disoriented when driving. She rarely drives and only during the day to the hair salon or occasionally [facility]. She is able to perform all her ADLs, such as bathing, dressing and feeding herself.  Her husband is disabled and she takes care of most of the house chores.  She does the laundry, washes the dishes and cleans the house.  She has been more unsteady and weak in her legs.   She had a couple of falls while carrying heavy loads of trash outside on uneven ground.  She has an exercise bike which she does not use.  She says she sleeps well.  MRI of the brain from 12/04/12 was reviewed and revealed mild to moderate atrophy with moderately extensive small vessel ischemic changes and small chronic lacunar infarct.  B12 level was 301.  MMSE score from 09/05/13 was 28/30.  She underwent neuropsychological testing in August-September 2014, which did reveal mild cognitive impairment, somewhat complicated by periods of depression and anxiety.  PAST MEDICAL HISTORY: Past Medical History  Diagnosis Date  . GERD (gastroesophageal reflux disease)   . Hypertension   . Hyperlipidemia   . Osteopenia   . History of colonic polyps   . Right forearm fracture 2011  . Anxiety   . Thyroid disease   . SEBACEOUS CYST, INFECTED 03/16/2009    Qualifier: Diagnosis of  By: Arnoldo Morale MD, [place] with no loss of consciousness 05/14/2008    Qualifier: Diagnosis of  By: Arnoldo Morale MD, Balinda Quails     MEDICATIONS: Current Outpatient Prescriptions on File Prior to Visit  Medication Sig Dispense Refill  . ALPRAZolam (XANAX) 0.25 MG tablet Take 1 tablet (0.25 mg total) by mouth at bedtime as needed for anxiety. 60 tablet 0  . aspirin 81 MG tablet Take 162 mg by mouth daily.     . Fish  Oil OIL by Does not apply route.    . galantamine (RAZADYNE ER) 16 MG 24 hr capsule TAKE 1 CAPSULE BY MOUTH ONCE DAILY WITH BREAKFAST 30 capsule 2  . Ibuprofen 200 MG CAPS Take 200 mg by mouth 2 (two) times daily.     Marland Kitchen imipramine (TOFRANIL) 50 MG tablet Take 1 tablets in evening (50mg ) 90 tablet 3  . levothyroxine (SYNTHROID, LEVOTHROID) 88 MCG tablet Take 1 tablet (88 mcg total) by mouth daily before breakfast. 90 tablet 1  . memantine (NAMENDA XR) 28 MG CP24 24 hr capsule Take 1 capsule (28 mg total) by mouth daily. 30 capsule 6  . Memantine HCl ER 28 MG CP24 Take 28 mg by mouth.    . mirabegron ER  (MYRBETRIQ) 25 MG TB24 tablet Take 25 mg by mouth daily.    . nadolol (CORGARD) 40 MG tablet Take 1 tablet (40 mg total) by mouth daily. 30 tablet 11  . rosuvastatin (CRESTOR) 20 MG tablet Take 1 tablet (20 mg total) by mouth once a week. 90 tablet 3  . sertraline (ZOLOFT) 50 MG tablet Take one tablet (50 mg total) by mouth daily. 30 tablet 6  . traZODone (DESYREL) 50 MG tablet Take 1 tablet (50 mg total) by mouth at bedtime as needed for sleep. 30 tablet 3   No current facility-administered medications on file prior to visit.    ALLERGIES: Allergies  Allergen Reactions  . Entex Other (See Comments)    unknown  . Sulfonamide Derivatives Other (See Comments)    unknown    FAMILY HISTORY: Family History  Problem Relation Age of Onset  . Lung cancer    . Hypertension Mother   . COPD Mother   . Cancer Father   . Stomach cancer Father   . Heart disease Brother   . Alzheimer's disease Maternal Aunt   . Drug abuse Maternal Uncle   . Cancer Maternal Uncle   . Colon cancer Neg Hx   . Esophageal cancer Neg Hx   . Rectal cancer Neg Hx     SOCIAL HISTORY: History   Social History  . Marital Status: Married    Spouse Name: N/A  . Number of Children: N/A  . Years of Education: N/A   Occupational History  . retired    Social History Main Topics  . Smoking status: Never Smoker   . Smokeless tobacco: Never Used  . Alcohol Use: No  . Drug Use: No  . Sexual Activity: No   Other Topics Concern  . Not on file   Social History Narrative   Married to husband Julia [name] and lives in [place]. 2 daughters live in [place], 1 son in [place] and 1 in [place].       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       Hobbies: tv, caring for cat (very important to her), change is difficult even with type of tv      Julia [name] (daughter) Julia [name] [phone]   Full Code    REVIEW OF SYSTEMS: Constitutional: No fevers, chills, or sweats, no generalized fatigue, change  in appetite Eyes: No visual changes, double vision, eye pain Ear, nose and throat: No hearing loss, ear pain, nasal congestion, sore throat Cardiovascular: No chest pain, palpitations Respiratory:  No shortness of breath at rest or with exertion, wheezes GastrointestinaI: No nausea, vomiting, diarrhea, abdominal pain, fecal incontinence Genitourinary:  No dysuria, urinary retention or frequency Musculoskeletal:  No neck pain, back pain Integumentary:  No rash, pruritus, skin lesions Neurological: as above Psychiatric: No depression, insomnia, anxiety Endocrine: No palpitations, fatigue, diaphoresis, mood swings, change in appetite, change in weight, increased thirst Hematologic/Lymphatic:  No anemia, purpura, petechiae. Allergic/Immunologic: no itchy/runny eyes, nasal congestion, recent allergic reactions, rashes  PHYSICAL EXAM: Filed Vitals:   08/13/14 1410  BP: 128/60  Pulse: 60  Resp: 16   General: No acute distress Head:  Normocephalic/atraumatic Eyes:  Fundoscopic exam unremarkable without vessel changes, exudates, hemorrhages or papilledema. Neck: supple, no paraspinal tenderness, full range of motion Heart:  Regular rate and rhythm Lungs:  Clear to auscultation bilaterally Back: No paraspinal tenderness Neurological Exam: alert and oriented to person, place, and time. Attention span and concentration shows some impairment, recent memory poor and remote memory intact, fund of knowledge intact.   Speech fluent and not dysarthric. CN II-XII intact. Fundi not visualized. Bulk and tone normal, muscle strength 5/5 throughout. Sensation to light touch intact. Deep tendon reflexes 2+ throughout. Finger to nose intact. Gait normal.  IMPRESSION: Alzheimer's dementia Depression  PLAN: 1. Continue galantamine ER 16mg , memantine XR 28mg  and sertraline 50mg  daily 2.  2.  Limit driving to only the hair salon (only daylight hours, no more than 40 mph).  Have one of your daughters accompany  you while you drive every once in a while  3.  Go for walks outside but only near the house and within sight of somebody else 4.  Follow up in 6 months  Metta Clines, DO  CC: Garret Reddish, MD

## 2014-08-13 NOTE — Patient Instructions (Addendum)
1.  Continue galantamine ER 16mg , memantine 28mg  and Zoloft 50mg  2.  Limit driving to only the hair salon (only daylight hours, no more than 40 mph).  Have one of your daughters accompany you while you drive every once in a while  3.  Go for walks outside but only near the house and within sight of somebody else 4.  Follow up in 6 months.

## 2014-09-05 ENCOUNTER — Emergency Department (HOSPITAL_COMMUNITY): Payer: Medicare Other

## 2014-09-05 ENCOUNTER — Observation Stay (HOSPITAL_COMMUNITY)
Admission: EM | Admit: 2014-09-05 | Discharge: 2014-09-06 | Disposition: A | Discharge status: Home / Self Care | Payer: Medicare Other | Attending: Cardiology | Admitting: Cardiology

## 2014-09-05 ENCOUNTER — Encounter (HOSPITAL_COMMUNITY): Payer: Self-pay | Admitting: *Deleted

## 2014-09-05 DIAGNOSIS — E079 Disorder of thyroid, unspecified: Secondary | ICD-10-CM | POA: Diagnosis not present

## 2014-09-05 DIAGNOSIS — I619 Nontraumatic intracerebral hemorrhage, unspecified: Secondary | ICD-10-CM | POA: Diagnosis not present

## 2014-09-05 DIAGNOSIS — Z86010 Personal history of colonic polyps: Secondary | ICD-10-CM | POA: Insufficient documentation

## 2014-09-05 DIAGNOSIS — Z872 Personal history of diseases of the skin and subcutaneous tissue: Secondary | ICD-10-CM | POA: Diagnosis not present

## 2014-09-05 DIAGNOSIS — I10 Essential (primary) hypertension: Secondary | ICD-10-CM | POA: Diagnosis not present

## 2014-09-05 DIAGNOSIS — R51 Headache: Secondary | ICD-10-CM | POA: Insufficient documentation

## 2014-09-05 DIAGNOSIS — Z8781 Personal history of (healed) traumatic fracture: Secondary | ICD-10-CM | POA: Diagnosis not present

## 2014-09-05 DIAGNOSIS — R0602 Shortness of breath: Secondary | ICD-10-CM | POA: Insufficient documentation

## 2014-09-05 DIAGNOSIS — Z79899 Other long term (current) drug therapy: Secondary | ICD-10-CM | POA: Diagnosis not present

## 2014-09-05 DIAGNOSIS — R001 Bradycardia, unspecified: Secondary | ICD-10-CM | POA: Diagnosis not present

## 2014-09-05 DIAGNOSIS — I4949 Other premature depolarization: Secondary | ICD-10-CM | POA: Diagnosis not present

## 2014-09-05 DIAGNOSIS — F0280 Dementia in other diseases classified elsewhere without behavioral disturbance: Secondary | ICD-10-CM | POA: Diagnosis present

## 2014-09-05 DIAGNOSIS — E785 Hyperlipidemia, unspecified: Secondary | ICD-10-CM | POA: Insufficient documentation

## 2014-09-05 DIAGNOSIS — Z87828 Personal history of other (healed) physical injury and trauma: Secondary | ICD-10-CM | POA: Insufficient documentation

## 2014-09-05 DIAGNOSIS — R531 Weakness: Secondary | ICD-10-CM | POA: Insufficient documentation

## 2014-09-05 DIAGNOSIS — K219 Gastro-esophageal reflux disease without esophagitis: Secondary | ICD-10-CM | POA: Insufficient documentation

## 2014-09-05 DIAGNOSIS — Z7982 Long term (current) use of aspirin: Secondary | ICD-10-CM | POA: Diagnosis not present

## 2014-09-05 DIAGNOSIS — Z791 Long term (current) use of non-steroidal anti-inflammatories (NSAID): Secondary | ICD-10-CM | POA: Diagnosis not present

## 2014-09-05 DIAGNOSIS — R42 Dizziness and giddiness: Secondary | ICD-10-CM | POA: Diagnosis not present

## 2014-09-05 DIAGNOSIS — M8580 Other specified disorders of bone density and structure, unspecified site: Secondary | ICD-10-CM | POA: Diagnosis not present

## 2014-09-05 DIAGNOSIS — G309 Alzheimer's disease, unspecified: Secondary | ICD-10-CM

## 2014-09-05 HISTORY — DX: Unspecified dementia, unspecified severity, without behavioral disturbance, psychotic disturbance, mood disturbance, and anxiety: F03.90

## 2014-09-05 LAB — URINALYSIS, ROUTINE W REFLEX MICROSCOPIC
BILIRUBIN URINE: NEGATIVE
Glucose, UA: NEGATIVE mg/dL
Hgb urine dipstick: NEGATIVE
KETONES UR: NEGATIVE mg/dL
Leukocytes, UA: NEGATIVE
Nitrite: NEGATIVE
PH: 7.5 (ref 5.0–8.0)
PROTEIN: NEGATIVE mg/dL
Specific Gravity, Urine: 1.013 (ref 1.005–1.030)
Urobilinogen, UA: 1 mg/dL (ref 0.0–1.0)

## 2014-09-05 LAB — CBC WITH DIFFERENTIAL/PLATELET
Basophils Absolute: 0 10*3/uL (ref 0.0–0.1)
Basophils Relative: 0 % (ref 0–1)
EOS ABS: 0.1 10*3/uL (ref 0.0–0.7)
Eosinophils Relative: 2 % (ref 0–5)
HCT: 42.3 % (ref 36.0–46.0)
Hemoglobin: 13.6 g/dL (ref 12.0–15.0)
Lymphocytes Relative: 40 % (ref 12–46)
Lymphs Abs: 1.8 10*3/uL (ref 0.7–4.0)
MCH: 29.1 pg (ref 26.0–34.0)
MCHC: 32.2 g/dL (ref 30.0–36.0)
MCV: 90.6 fL (ref 78.0–100.0)
MONOS PCT: 10 % (ref 3–12)
Monocytes Absolute: 0.5 10*3/uL (ref 0.1–1.0)
NEUTROS PCT: 48 % (ref 43–77)
Neutro Abs: 2.1 10*3/uL (ref 1.7–7.7)
PLATELETS: 191 10*3/uL (ref 150–400)
RBC: 4.67 MIL/uL (ref 3.87–5.11)
RDW: 13.9 % (ref 11.5–15.5)
WBC: 4.5 10*3/uL (ref 4.0–10.5)

## 2014-09-05 LAB — I-STAT CG4 LACTIC ACID, ED: Lactic Acid, Venous: 1.63 mmol/L (ref 0.5–2.0)

## 2014-09-05 LAB — COMPREHENSIVE METABOLIC PANEL
ALBUMIN: 3.7 g/dL (ref 3.5–5.2)
ALK PHOS: 55 U/L (ref 39–117)
ALT: 19 U/L (ref 0–35)
AST: 32 U/L (ref 0–37)
Anion gap: 9 (ref 5–15)
BILIRUBIN TOTAL: 0.8 mg/dL (ref 0.3–1.2)
BUN: 16 mg/dL (ref 6–23)
CHLORIDE: 104 mmol/L (ref 96–112)
CO2: 27 mmol/L (ref 19–32)
Calcium: 9.6 mg/dL (ref 8.4–10.5)
Creatinine, Ser: 1.17 mg/dL — ABNORMAL HIGH (ref 0.50–1.10)
GFR calc Af Amer: 51 mL/min — ABNORMAL LOW (ref >90)
GFR calc non Af Amer: 44 mL/min — ABNORMAL LOW (ref >90)
Glucose, Bld: 128 mg/dL — ABNORMAL HIGH (ref 70–99)
Potassium: 3.7 mmol/L (ref 3.5–5.1)
Sodium: 140 mmol/L (ref 135–145)
Total Protein: 6 g/dL (ref 6.0–8.3)

## 2014-09-05 LAB — RAPID URINE DRUG SCREEN, HOSP PERFORMED
AMPHETAMINES: NOT DETECTED
BENZODIAZEPINES: NOT DETECTED
Barbiturates: NOT DETECTED
COCAINE: NOT DETECTED
Opiates: NOT DETECTED
Tetrahydrocannabinol: NOT DETECTED

## 2014-09-05 LAB — I-STAT TROPONIN, ED: TROPONIN I, POC: 0 ng/mL (ref 0.00–0.08)

## 2014-09-05 LAB — CBG MONITORING, ED: Glucose-Capillary: 120 mg/dL — ABNORMAL HIGH (ref 70–99)

## 2014-09-05 LAB — PROTIME-INR
INR: 0.95 (ref 0.00–1.49)
PROTHROMBIN TIME: 12.8 s (ref 11.6–15.2)

## 2014-09-05 LAB — ETHANOL

## 2014-09-05 LAB — LIPASE, BLOOD: Lipase: 53 U/L (ref 11–59)

## 2014-09-05 MED ORDER — TRAZODONE HCL 50 MG PO TABS
50.0000 mg | ORAL_TABLET | Freq: Every evening | ORAL | Status: DC | PRN
  Administered 2014-09-05: 50 mg via ORAL
  Filled 2014-09-05: qty 1

## 2014-09-05 MED ORDER — AMLODIPINE BESYLATE 2.5 MG PO TABS
2.5000 mg | ORAL_TABLET | Freq: Every day | ORAL | Status: DC
  Administered 2014-09-05 – 2014-09-06 (×2): 2.5 mg via ORAL
  Filled 2014-09-05 (×2): qty 1

## 2014-09-05 MED ORDER — ALPRAZOLAM 0.25 MG PO TABS: 0.2500 mg | ORAL_TABLET | Freq: Every evening | ORAL | Status: DC | PRN

## 2014-09-05 MED ORDER — MEMANTINE HCL ER 28 MG PO CP24
28.0000 mg | ORAL_CAPSULE | Freq: Every day | ORAL | Status: DC
  Administered 2014-09-05: 28 mg via ORAL
  Filled 2014-09-05 (×2): qty 1

## 2014-09-05 MED ORDER — MIRABEGRON ER 25 MG PO TB24
25.0000 mg | ORAL_TABLET | Freq: Every day | ORAL | Status: DC
  Administered 2014-09-05 – 2014-09-06 (×2): 25 mg via ORAL
  Filled 2014-09-05 (×2): qty 1

## 2014-09-05 MED ORDER — LEVOTHYROXINE SODIUM 88 MCG PO TABS
88.0000 ug | ORAL_TABLET | Freq: Every day | ORAL | Status: DC
  Administered 2014-09-06: 88 ug via ORAL
  Filled 2014-09-05: qty 1

## 2014-09-05 MED ORDER — SERTRALINE HCL 50 MG PO TABS
50.0000 mg | ORAL_TABLET | Freq: Every day | ORAL | Status: DC
  Administered 2014-09-05 – 2014-09-06 (×2): 50 mg via ORAL
  Filled 2014-09-05 (×2): qty 1

## 2014-09-05 MED ORDER — ASPIRIN 81 MG PO CHEW
81.0000 mg | CHEWABLE_TABLET | Freq: Every day | ORAL | Status: DC
  Administered 2014-09-05: 81 mg via ORAL
  Filled 2014-09-05 (×3): qty 1

## 2014-09-05 MED ORDER — GALANTAMINE HYDROBROMIDE ER 8 MG PO CP24
16.0000 mg | ORAL_CAPSULE | Freq: Every day | ORAL | Status: DC
  Administered 2014-09-06: 16 mg via ORAL
  Filled 2014-09-05 (×2): qty 2

## 2014-09-05 MED ORDER — IMIPRAMINE HCL 50 MG PO TABS
50.0000 mg | ORAL_TABLET | Freq: Two times a day (BID) | ORAL | Status: DC | PRN
  Administered 2014-09-05: 50 mg via ORAL
  Filled 2014-09-05 (×3): qty 1

## 2014-09-05 MED ORDER — ROSUVASTATIN CALCIUM 10 MG PO TABS: 20.0000 mg | ORAL_TABLET | ORAL | Status: DC

## 2014-09-05 MED ORDER — MEMANTINE HCL ER 28 MG PO CP24: 28.0000 mg | ORAL_CAPSULE | Freq: Every day | ORAL | Status: DC

## 2014-09-05 NOTE — ED Notes (Signed)
Report given to Cyril Mourning, RN at bedside.  Pt arrived in ambulatory with assistance condition, belongings at bedside.

## 2014-09-05 NOTE — ED Notes (Signed)
PATIENT REPORTS THAT SHE MAY HAVE ONSET OF ALZHEIMERS.  SHE REPORTS HER DAUGHTER MANAGES HER MEDICATIONS AT THIS TIME.  SHE IS UNABLE TO STATE WHAT SHE IS ON.

## 2014-09-05 NOTE — H&P (Addendum)
CARDIOLOGY ADMISSION NOTE   Patient ID: Julia [name] MRN: [id number], DOB/AGE: Dec 22, 1934   Admit date: 09/05/2014 Date of Consult: 09/05/2014  Primary Physician: Garret Reddish, MD Primary Cardiologist: none  Reason for consult:  Dizziness, bradycardia   Problem List  Past Medical History  Diagnosis Date  . GERD (gastroesophageal reflux disease)   . Hypertension   . Hyperlipidemia   . Osteopenia   . History of colonic polyps   . Right forearm fracture 2011  . Anxiety   . Thyroid disease   . SEBACEOUS CYST, INFECTED 03/16/2009    Qualifier: Diagnosis of  By: Arnoldo Morale MD, [place] with no loss of consciousness 05/14/2008    Qualifier: Diagnosis of  By: Arnoldo Morale MD, Balinda Quails Dementia     Past Surgical History  Procedure Laterality Date  . Rotator cuff repair      right shoulder  . Bladder tack    . Tonsillectomy    . Bilateral salpingoophorectomy    . Surgical repair to fx left wrist    . Abdominal hysterectomy  2009     Allergies  Allergies  Allergen Reactions  . Entex Other (See Comments)    unknown  . Sulfonamide Derivatives Other (See Comments)    unknown    HPI   Julia [name] is a 80 y.o. female with a PMHx of GERD, HTN, hyperlipidemia, osteopenia, anxiety, and thyroid disease who presents to the Emergency Department complaining of constant, ongoing dizziness x 1 day. The patient has dementia and is poor historian. She denies any prior h/o heart disease. Pt also reports mild SOB, weakness, and mild HA. Julia [name] attributes current symptoms to anxiety and stress in last few days. Pt took dose of Xanex prior to arrival but did not help. No recent falls but admits to falls in the past. She denies any recent fever, congestion, cough, vomiting, diarrhea, or chills. Pt with known allergies to Entex and Sulfonamide derivatives.  She can't name her medications due to dementia but her list shows nadolol.   Current outpatient prescriptions:     .  ALPRAZolam (XANAX) 0.25 MG tablet, Take 1 tablet (0.25 mg total) by mouth at bedtime as needed for anxiety., Disp: 60 tablet, Rfl: 0 .  aspirin 81 MG tablet, Take 162 mg by mouth daily. , Disp: , Rfl:  .  Fish Oil OIL, by Does not apply route., Disp: , Rfl:  .  galantamine (RAZADYNE ER) 16 MG 24 hr capsule, TAKE 1 CAPSULE BY MOUTH ONCE DAILY WITH BREAKFAST, Disp: 30 capsule, Rfl: 2 .  Ibuprofen 200 MG CAPS, Take 200 mg by mouth 2 (two) times daily. , Disp: , Rfl:  .  imipramine (TOFRANIL) 50 MG tablet, Take 1 tablets in evening (50mg ), Disp: 90 tablet, Rfl: 3 .  levothyroxine (SYNTHROID, LEVOTHROID) 88 MCG tablet, Take 1 tablet (88 mcg total) by mouth daily before breakfast., Disp: 90 tablet, Rfl: 1 .  memantine (NAMENDA XR) 28 MG CP24 24 hr capsule, Take 1 capsule (28 mg total) by mouth daily., Disp: 30 capsule, Rfl: 6 .  Memantine HCl ER 28 MG CP24, Take 28 mg by mouth., Disp: , Rfl:  .  mirabegron ER (MYRBETRIQ) 25 MG TB24 tablet, Take 25 mg by mouth daily., Disp: , Rfl:  .  nadolol (CORGARD) 40 MG tablet, Take 1 tablet (40 mg total) by mouth daily., Disp: 30 tablet, Rfl: 11 .  rosuvastatin (CRESTOR) 20 MG tablet, Take 1 tablet (  20 mg total) by mouth once a week., Disp: 90 tablet, Rfl: 3 .  sertraline (ZOLOFT) 50 MG tablet, Take one tablet (50 mg total) by mouth daily., Disp: 30 tablet, Rfl: 6 .  traZODone (DESYREL) 50 MG tablet, Take 1 tablet (50 mg total) by mouth at bedtime as needed for sleep., Disp: 30 tablet, Rfl: 3  Family History Family History  Problem Relation Age of Onset  . Lung cancer    . Hypertension Mother   . COPD Mother   . Cancer Father   . Stomach cancer Father   . Heart disease Brother   . Alzheimer's disease Maternal Aunt   . Drug abuse Maternal Uncle   . Cancer Maternal Uncle   . Colon cancer Neg Hx   . Esophageal cancer Neg Hx   . Rectal cancer Neg Hx      Social History History   Social History  . Marital Status: Married    Spouse Name: N/A  .  Number of Children: N/A  . Years of Education: N/A   Occupational History  . retired    Social History Main Topics  . Smoking status: Never Smoker   . Smokeless tobacco: Never Used  . Alcohol Use: No  . Drug Use: No  . Sexual Activity: No   Other Topics Concern  . Not on file   Social History Narrative   Married to husband Ardyth Gal and lives in [place]. 2 daughters live in [place], 1 son in [place] and 1 in [place].       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       Hobbies: tv, caring for cat (very important to her), change is difficult even with type of tv      Jake Samples (daughter) Chauncey Reading [phone]   Full Code     Review of Systems  General:  No chills, fever, night sweats or weight changes.  Cardiovascular:  No chest pain, dyspnea on exertion, edema, orthopnea, palpitations, paroxysmal nocturnal dyspnea. Dermatological: No rash, lesions/masses Respiratory: No cough, dyspnea Urologic: No hematuria, dysuria Abdominal:   No nausea, vomiting, diarrhea, bright red blood per rectum, melena, or hematemesis Neurologic:  No visual changes, wkns, changes in mental status. All other systems reviewed and are otherwise negative except as noted above.  Physical Exam  Blood pressure 157/47, pulse 50, temperature 97.4 F (36.3 C), temperature source Oral, resp. rate 11, height 5\' 3"  (1.6 m), weight 119 lb (53.978 kg), SpO2 97 %.  General: Pleasant, NAD Psych: Normal affect. Neuro: Alert and oriented X 3. Moves all extremities spontaneously. HEENT: Normal  Neck: Supple without bruits or JVD. Lungs:  Resp regular and unlabored, CTA. Heart: RRR no s3, s4, or murmurs. Abdomen: Soft, non-tender, non-distended, BS + x 4.  Extremities: No clubbing, cyanosis or edema. DP/PT/Radials 2+ and equal bilaterally.  Labs  No results for input(s): CKTOTAL, CKMB, TROPONINI in the last 72 hours. Lab Results  Component Value Date   WBC 4.5 09/05/2014   HGB 13.6  09/05/2014   HCT 42.3 09/05/2014   MCV 90.6 09/05/2014   PLT 191 09/05/2014    Recent Labs Lab 09/05/14 0400  NA 140  K 3.7  CL 104  CO2 27  BUN 16  CREATININE 1.17*  CALCIUM 9.6  PROT 6.0  BILITOT 0.8  ALKPHOS 55  ALT 19  AST 32  GLUCOSE 128*   Lab Results  Component Value Date   CHOL 189 04/03/2013   HDL 61.20 04/03/2013  LDLCALC 101* 04/03/2013   TRIG 134.0 04/03/2013   No results found for: DDIMER Invalid input(s): POCBNP  Radiology/Studies  Dg Chest 2 View  09/05/2014   CLINICAL DATA:  Dizziness and shortness of breath.  EXAM: CHEST  2 VIEW  COMPARISON:  08/26/2013  FINDINGS: Normal heart size and pulmonary vascularity. Emphysematous changes throughout the lungs with scattered fibrosis. No blunting of costophrenic angles. No pneumothorax. No focal consolidation or airspace disease. Calcified and tortuous aorta. Mild degenerative changes in the spine.  IMPRESSION: Emphysematous changes in the lungs. No evidence of active pulmonary disease.   Electronically Signed   By: Lucienne Capers M.D.   On: 09/05/2014 04:52   Mr Brain Wo Contrast  09/05/2014   CLINICAL DATA:  Ongoing dizziness for 1 day, shortness of breath, weakness and mild headache. History of hypertension, hyperlipidemia, anxiety.   IMPRESSION: No acute ischemia.  A few scattered supratentorial micro hemorrhages, new in the RIGHT occipital lobe may be seen with chronic hypertension, corroborated by intracranial vascular dolichoectasia.  Bilateral cystic corona radiata lacunar infarcts (less likely underlying component demyelination), complex on the RIGHT though, stable from 2014. Tiny cerebellar infarcts. Moderate to severe white matter changes suggest chronic small vessel ischemic disease.   Electronically Signed   By: Elon Alas   On: 09/05/2014 06:53    Echocardiogram: none  ECG: Sinus bradycardia alternating with low atrial rhythm     ASSESSMENT AND PLAN  80 year old female  1. Sinus  bradycardia and low atrial rhythm - the patient was on nadolol at home (not clear indication), we will hold it, since it is long acting betablocker it might take up to 72 hours (3 1/2 lives) to washout.  We will order echocardiogram.  No indication for a pacemaker at this point. She can be admitted to telemetry.  2. Hypertension - start amlodipine 2.5 mg po daily  3. Hyperlipidemia - on crestor 20 mg po daily  Signed, Dorothy Spark, MD, [facility] 09/05/2014, 8:43 AM

## 2014-09-05 NOTE — Discharge Instructions (Signed)
Dizziness Ms. Chisenhall, your MRI did not show any new strokes.  See her primary care provider within 3 days for close follow-up. If symptoms worsen come back to emergency department immediately. Thank you.  Dizziness means you feel unsteady or lightheaded. You might feel like you are going to pass out (faint). HOME CARE   Drink enough fluids to keep your pee (urine) clear or pale yellow.  Take your medicines exactly as told by your doctor. If you take blood pressure medicine, always stand up slowly from the lying or sitting position. Hold on to something to steady yourself.  If you need to stand in one place for a long time, move your legs often. Tighten and relax your leg muscles.  Have someone stay with you until you feel okay.  Do not drive or use heavy machinery if you feel dizzy.  Do not drink alcohol. GET HELP RIGHT AWAY IF:   You feel dizzy or lightheaded and it gets worse.  You feel sick to your stomach (nauseous), or you throw up (vomit).  You have trouble talking or walking.  You feel weak or have trouble using your arms, hands, or legs.  You cannot think clearly or have trouble forming sentences.  You have chest pain, belly (abdominal) pain, sweating, or you are short of breath.  Your vision changes.  You are bleeding.  You have problems from your medicine that seem to be getting worse. MAKE SURE YOU:   Understand these instructions.  Will watch your condition.  Will get help right away if you are not doing well or get worse. Document Released: 05/05/2011 Document Revised: 08/08/2011 Document Reviewed: 05/05/2011 [facility] Patient Information 2015 [place], [place]. This information is not intended to replace advice given to you by your health care provider. Make sure you discuss any questions you have with your health care provider.

## 2014-09-05 NOTE — Consult Note (Signed)
entered in error   

## 2014-09-05 NOTE — ED Notes (Signed)
Patient transported to X-ray 

## 2014-09-05 NOTE — ED Notes (Signed)
MD at bedside. 

## 2014-09-05 NOTE — ED Notes (Signed)
Patient transported to MRI 

## 2014-09-05 NOTE — ED Provider Notes (Addendum)
CSN: [id number]     Arrival date & time 09/05/14  0320 History   First MD Initiated Contact with Patient 09/05/14 0339     This chart was scribed for Everlene Balls, MD by Forrestine Him, ED Scribe. This patient was seen in room A10C/A10C and the patient's care was started 3:44 AM.   Chief Complaint  Patient presents with  . Dizziness  . Weakness   The history is provided by the patient. No language interpreter was used.    HPI Comments: Julia [name] is a 80 y.o. female with a PMHx of GERD, HTN, hyperlipidemia, osteopenia, anxiety, and thyroid disease who presents to the Emergency Department complaining of constant, ongoing dizziness x 1 day. Pt also reports mild SOB, weakness, and mild HA. [name] attributes current symptoms to anxiety in last few days. Pt took dose of Xanex prior to arrival but did not help. No recent falls but admits to falls in the past. She denies any recent fever, congestion, cough, vomiting, diarrhea, or chills. Pt with known allergies to Entex and Sulfonamide derivatives.   Past Medical History  Diagnosis Date  . GERD (gastroesophageal reflux disease)   . Hypertension   . Hyperlipidemia   . Osteopenia   . History of colonic polyps   . Right forearm fracture 2011  . Anxiety   . Thyroid disease   . SEBACEOUS CYST, INFECTED 03/16/2009    Qualifier: Diagnosis of  By: Arnoldo Morale MD, [place] with no loss of consciousness 05/14/2008    Qualifier: Diagnosis of  By: Arnoldo Morale MD, Balinda Quails    Past Surgical History  Procedure Laterality Date  . Rotator cuff repair      right shoulder  . Bladder tack    . Tonsillectomy    . Bilateral salpingoophorectomy    . Surgical repair to fx left wrist    . Abdominal hysterectomy  2009   Family History  Problem Relation Age of Onset  . Lung cancer    . Hypertension Mother   . COPD Mother   . Cancer Father   . Stomach cancer Father   . Heart disease Brother   . Alzheimer's disease Maternal Aunt   . Drug  abuse Maternal Uncle   . Cancer Maternal Uncle   . Colon cancer Neg Hx   . Esophageal cancer Neg Hx   . Rectal cancer Neg Hx    History  Substance Use Topics  . Smoking status: Never Smoker   . Smokeless tobacco: Never Used  . Alcohol Use: No   OB History    No data available     Review of Systems  Constitutional: Negative for fever and chills.  Respiratory: Positive for shortness of breath.   Gastrointestinal: Negative for nausea, vomiting and diarrhea.  Neurological: Positive for dizziness, weakness and headaches.  All other systems reviewed and are negative.     Allergies  Entex and Sulfonamide derivatives  Home Medications   Prior to Admission medications   Medication Sig Start Date End Date Taking? Authorizing Provider  ALPRAZolam (XANAX) 0.25 MG tablet Take 1 tablet (0.25 mg total) by mouth at bedtime as needed for anxiety. 02/25/14   Marin Olp, MD  aspirin 81 MG tablet Take 162 mg by mouth daily.     Historical Provider, MD  Fish Oil OIL by Does not apply route.    Historical Provider, MD  galantamine (RAZADYNE ER) 16 MG 24 hr capsule TAKE 1 CAPSULE BY MOUTH ONCE  DAILY WITH BREAKFAST 07/22/14   Pieter Partridge, DO  Ibuprofen 200 MG CAPS Take 200 mg by mouth 2 (two) times daily.     Historical Provider, MD  imipramine (TOFRANIL) 50 MG tablet Take 1 tablets in evening (50mg ) 08/05/14   Marin Olp, MD  levothyroxine (SYNTHROID, LEVOTHROID) 88 MCG tablet Take 1 tablet (88 mcg total) by mouth daily before breakfast. 05/07/14   Marin Olp, MD  memantine (NAMENDA XR) 28 MG CP24 24 hr capsule Take 1 capsule (28 mg total) by mouth daily. 08/07/14   Pieter Partridge, DO  Memantine HCl ER 28 MG CP24 Take 28 mg by mouth.    Historical Provider, MD  mirabegron ER (MYRBETRIQ) 25 MG TB24 tablet Take 25 mg by mouth daily.    Historical Provider, MD  nadolol (CORGARD) 40 MG tablet Take 1 tablet (40 mg total) by mouth daily. 06/13/14   Marin Olp, MD  rosuvastatin  (CRESTOR) 20 MG tablet Take 1 tablet (20 mg total) by mouth once a week. 07/02/12   Ricard Dillon, MD  sertraline (ZOLOFT) 50 MG tablet Take one tablet (50 mg total) by mouth daily. 08/07/14   Pieter Partridge, DO  traZODone (DESYREL) 50 MG tablet Take 1 tablet (50 mg total) by mouth at bedtime as needed for sleep. 08/05/14   Marin Olp, MD   Triage Vitals: BP 160/68 mmHg  Pulse 56  Temp(Src) 97.4 F (36.3 C) (Oral)  Resp 14  Ht 5\' 3"  (1.6 m)  Wt 119 lb (53.978 kg)  BMI 21.09 kg/m2  SpO2 100%   Physical Exam  Constitutional: She is oriented to person, place, and time. She appears well-developed and well-nourished. No distress.  HENT:  Head: Normocephalic and atraumatic.  Nose: Nose normal.  Mouth/Throat: Oropharynx is clear and moist. No oropharyngeal exudate.  Eyes: Conjunctivae and EOM are normal. Pupils are equal, round, and reactive to light. No scleral icterus.  Neck: Normal range of motion. Neck supple. No JVD present. No tracheal deviation present. No thyromegaly present.  Cardiovascular: Normal rate, regular rhythm and normal heart sounds.  Exam reveals no gallop and no friction rub.   No murmur heard. Pulmonary/Chest: Effort normal and breath sounds normal. No respiratory distress. She has no wheezes. She exhibits no tenderness.  Abdominal: Soft. Bowel sounds are normal. She exhibits no distension and no mass. There is no tenderness. There is no rebound and no guarding.  Musculoskeletal: Normal range of motion. She exhibits no edema or tenderness.  Lymphadenopathy:    She has no cervical adenopathy.  Neurological: She is alert and oriented to person, place, and time. No cranial nerve deficit. She exhibits normal muscle tone.  Skin: Skin is warm and dry. No rash noted. No erythema. No pallor.  Nursing note and vitals reviewed.   ED Course  Procedures (including critical care time)  DIAGNOSTIC STUDIES: Oxygen Saturation is 100% on RA, Normal by my interpretation.     COORDINATION OF CARE: 3:49 AM-Discussed treatment plan with pt at bedside and pt agreed to plan.     Labs Review Labs Reviewed  COMPREHENSIVE METABOLIC PANEL - Abnormal; Notable for the following:    Glucose, Bld 128 (*)    Creatinine, Ser 1.17 (*)    GFR calc non Af Amer 44 (*)    GFR calc Af Amer 51 (*)    All other components within normal limits  URINALYSIS, ROUTINE W REFLEX MICROSCOPIC - Abnormal; Notable for the following:  APPearance CLOUDY (*)    All other components within normal limits  CBG MONITORING, ED - Abnormal; Notable for the following:    Glucose-Capillary 120 (*)    All other components within normal limits  URINE CULTURE  CBC WITH DIFFERENTIAL/PLATELET  LIPASE, BLOOD  PROTIME-INR  URINE RAPID DRUG SCREEN (HOSP PERFORMED)  ETHANOL  CBG MONITORING, ED  I-STAT TROPOININ, ED  I-STAT CG4 LACTIC ACID, ED    Imaging Review Dg Chest 2 View  09/05/2014   CLINICAL DATA:  Dizziness and shortness of breath.  EXAM: CHEST  2 VIEW  COMPARISON:  08/26/2013  FINDINGS: Normal heart size and pulmonary vascularity. Emphysematous changes throughout the lungs with scattered fibrosis. No blunting of costophrenic angles. No pneumothorax. No focal consolidation or airspace disease. Calcified and tortuous aorta. Mild degenerative changes in the spine.  IMPRESSION: Emphysematous changes in the lungs. No evidence of active pulmonary disease.   Electronically Signed   By: Lucienne Capers M.D.   On: 09/05/2014 04:52   Mr Brain Wo Contrast  09/05/2014   CLINICAL DATA:  Ongoing dizziness for 1 day, shortness of breath, weakness and mild headache. History of hypertension, hyperlipidemia, anxiety.  EXAM: MRI HEAD WITHOUT CONTRAST  TECHNIQUE: Multiplanar, multiecho pulse sequences of the brain and surrounding structures were obtained without intravenous contrast.  COMPARISON:  CT of the head August 26, 2013 and MRI of the brain December 04, 2012  FINDINGS: No reduced diffusion to suggest acute  ischemia. At least 3 tiny supratentorial punctate foci of susceptibility artifact, new within the RIGHT occipital lobe. Moderate ventriculomegaly, likely on the basis of global parenchymal brain volume loss as there is overall commensurate enlargement of cerebral sulci and cerebellar folia. Again seen is a RIGHT periventricular cystic lesion with intermediate FLAIR signal major Ing 10 mm. Additional LEFT corona radiata/centrum semiovale small infarcts. Remote tiny bilateral cerebellar infarcts. Patchy to confluent supratentorial white matter T2 hyperintensities. Innumerable tiny bilateral basal ganglia T2 hyperintensities. No midline shift or mass effect.  No abnormal extra-axial fluid collections. Major intracranial vascular flow voids seen at the skull base with dolichoectasia. Paranasal sinuses and mastoid air cells are well aerated. Ocular globes and orbital contents are unremarkable. No abnormal sellar expansion. No cerebellar tonsillar ectopia. Bright T1 bone marrow signal in the cervical spine suggests osteopenia.  IMPRESSION: No acute ischemia.  A few scattered supratentorial micro hemorrhages, new in the RIGHT occipital lobe may be seen with chronic hypertension, corroborated by intracranial vascular dolichoectasia.  Bilateral cystic corona radiata lacunar infarcts (less likely underlying component demyelination), complex on the RIGHT though, stable from 2014. Tiny cerebellar infarcts. Moderate to severe white matter changes suggest chronic small vessel ischemic disease.   Electronically Signed   By: Elon Alas   On: 09/05/2014 06:53     EKG Interpretation None      MDM   Final diagnoses:  Dizziness    Patient since emergency department for dizziness and weakness. This began today. She states that she has had a stroke before but does not know her symptoms at that time. She denies any other neurological symptoms at that time. It is difficult to distinguish this may be a stroke, will  obtain MRI for evaluation. Also obtain laboratory studies, an infectious workup, EKG which reveals sinus bradycardia, unchanged from previous. Patient has been bradycardic in the emergency department, this is mostly while she is asleep. Looking back at old records her heart rate remains in the 60s and 70s. She is currently asymptomatic from this. I  do not believe this is the etiology of her weakness.  MRI is currently pending for possible stroke. Patient will be signed out to oncoming provider please see their note for ultimate disposition of this patient. If the patient did have an acute stroke she will require admission. If MRI is negative patient will be safe for discharge. I will prepare the discharge paperwork in case this occurs.  MRI came back and does not show acute ischemia, only positive for chronic hypertensive changes.  Patient is no longer dizzy or weak.  She continues to be bradycardic with HR in the 40s.  Will consult cardiology.  Axis III with cardiology, they will evaluate the patient emergency department. They do have concern for pacemaker placement. I assume they will omit this patient. If not the patient will require admission to internal medicine for symptomatic bradycardia.  I personally performed the services described in this documentation, which was scribed in my presence. The recorded information has been reviewed and is accurate.    Everlene Balls, MD 09/05/14 [id number]  Everlene Balls, MD 09/05/14 4193  Everlene Balls, MD 09/05/14 7902

## 2014-09-05 NOTE — ED Notes (Signed)
Patient reports she has felt dizzy and weak during the day on yesterday.  She states she does not feel right in the head.  Patient states she does have a headache.  Patient denies any falls.  Patient states she has been under more stress.  Patient states her vision is normal.  Patient denies n/v/d.  Patient is noted to by hypertensive in triage.  She states she does not think this is her baseline.  Patient speech is clear.  No noted weakness on exam.  No facial weakness noted.  Speech is clear.  She is unable to pin point time she noticed onset of sx but her sx worsened with more weakness/dizziness, and anxiety.  She did take xanax but did not help

## 2014-09-06 ENCOUNTER — Other Ambulatory Visit: Payer: Self-pay | Admitting: Physician Assistant

## 2014-09-06 DIAGNOSIS — R42 Dizziness and giddiness: Secondary | ICD-10-CM | POA: Diagnosis not present

## 2014-09-06 DIAGNOSIS — R0602 Shortness of breath: Secondary | ICD-10-CM

## 2014-09-06 DIAGNOSIS — R531 Weakness: Secondary | ICD-10-CM | POA: Diagnosis not present

## 2014-09-06 DIAGNOSIS — I10 Essential (primary) hypertension: Secondary | ICD-10-CM | POA: Diagnosis not present

## 2014-09-06 DIAGNOSIS — R001 Bradycardia, unspecified: Secondary | ICD-10-CM | POA: Diagnosis not present

## 2014-09-06 DIAGNOSIS — I4949 Other premature depolarization: Secondary | ICD-10-CM | POA: Diagnosis not present

## 2014-09-06 DIAGNOSIS — R51 Headache: Secondary | ICD-10-CM | POA: Diagnosis not present

## 2014-09-06 LAB — URINE CULTURE

## 2014-09-06 MED ORDER — AMLODIPINE BESYLATE 2.5 MG PO TABS: 2.5000 mg | ORAL_TABLET | Freq: Every day | ORAL | Status: DC

## 2014-09-06 MED ORDER — IBUPROFEN 200 MG PO CAPS: 200.0000 mg | ORAL_CAPSULE | Freq: Every day | ORAL | Status: DC | PRN

## 2014-09-06 NOTE — Care Management Note (Signed)
    Page 1 of 1   09/06/2014     4:42:15 PM CARE MANAGEMENT NOTE 09/06/2014  Patient:  Julia [name], Julia [name]   Account Number:  [id number]  Date Initiated:  09/06/2014  Documentation initiated by:  [facility]  Subjective/Objective Assessment:   adm: Dizziness, bradycardia     Action/Plan:   discharge planning   Anticipated DC Date:  09/06/2014   Anticipated DC Plan:  [place]  CM consult      Choice offered to / List presented to:             Status of service:  Completed, signed off Medicare Important Message given?   (If response is "NO", the following Medicare IM given date fields will be blank) Date Medicare IM given:   Medicare IM given by:   Date Additional Medicare IM given:   Additional Medicare IM given by:    Discharge Disposition:    Per UR Regulation:    If discussed at Long Length of Stay Meetings, dates discussed:    Comments:  09/06/14 11:30 Cm met with pt in room to give and explain Code 44; pt verbalized understanding and signed; CM made copy and gave pt copy, placed original in shadow chart and one for CM file.  No other CM needs were communicated. Tempie Hoist, BSN, [place].

## 2014-09-06 NOTE — Progress Notes (Signed)
Patient Name: Julia [name] Date of Encounter: 09/06/2014  Active Problems:   Sinus bradycardia   Junctional escape rhythm   Dizziness   SOB (shortness of breath)   Hypertension   Bradycardia   Length of Stay: 1  SUBJECTIVE  The patient feels significantly better today. No dizziness, she was able to walk to the bathroom with no problems.   CURRENT MEDS . amLODipine  2.5 mg Oral Daily  . aspirin  81 mg Oral Daily  . galantamine  16 mg Oral Q breakfast  . levothyroxine  88 mcg Oral QAC breakfast  . memantine  28 mg Oral Daily  . mirabegron ER  25 mg Oral Daily  . [START ON 09/11/2014] rosuvastatin  20 mg Oral Q Thu-1800  . sertraline  50 mg Oral Daily    OBJECTIVE  Filed Vitals:   09/05/14 2052 09/06/14 0500 09/06/14 0844 09/06/14 0930  BP: 161/56 127/59 133/78   Pulse: 50 56 57   Temp: 97.8 F (36.6 C) 97.5 F (36.4 C) 97.7 F (36.5 C)   TempSrc: Oral Oral Oral   Resp:   14   Height:  5\' 4"  (1.626 m)  5\' 4"  (1.626 m)  Weight:  118 lb 14.4 oz (53.933 kg)  119 lb 6.4 oz (54.159 kg)  SpO2: 100% 94% 95%     Intake/Output Summary (Last 24 hours) at 09/06/14 1147 Last data filed at 09/06/14 1133  Gross per 24 hour  Intake    360 ml  Output   1150 ml  Net   -790 ml   Filed Weights   09/05/14 1100 09/06/14 0500 09/06/14 0930  Weight: 118 lb 11.2 oz (53.842 kg) 118 lb 14.4 oz (53.933 kg) 119 lb 6.4 oz (54.159 kg)   PHYSICAL EXAM  General: Pleasant, NAD. Neuro: Alert and oriented X 3. Moves all extremities spontaneously. Psych: Normal affect. HEENT:  Normal  Neck: Supple without bruits or JVD. Lungs:  Resp regular and unlabored, CTA. Heart: RRR no s3, s4, or murmurs. Abdomen: Soft, non-tender, non-distended, BS + x 4.  Extremities: No clubbing, cyanosis or edema. DP/PT/Radials 2+ and equal bilaterally.  Accessory Clinical Findings  CBC  Recent Labs  09/05/14 0400  WBC 4.5  NEUTROABS 2.1  HGB 13.6  HCT 42.3  MCV 90.6  PLT 675   Basic  Metabolic Panel  Recent Labs  09/05/14 0400  NA 140  K 3.7  CL 104  CO2 27  GLUCOSE 128*  BUN 16  CREATININE 1.17*  CALCIUM 9.6   Liver Function Tests  Recent Labs  09/05/14 0400  AST 32  ALT 19  ALKPHOS 55  BILITOT 0.8  PROT 6.0  ALBUMIN 3.7    Recent Labs  09/05/14 0400  LIPASE 53   Radiology/Studies  Dg Chest 2 View  09/05/2014   CLINICAL DATA:  Dizziness and shortness of breath.  EXAM: CHEST  2 VIEW  COMPARISON:  08/26/2013  FINDINGS: Normal heart size and pulmonary vascularity. Emphysematous changes throughout the lungs with scattered fibrosis. No blunting of costophrenic angles. No pneumothorax. No focal consolidation or airspace disease. Calcified and tortuous aorta. Mild degenerative changes in the spine.  IMPRESSION: Emphysematous changes in the lungs. No evidence of active pulmonary disease.   Electronically Signed   By: Lucienne Capers M.D.   On: 09/05/2014 04:52   Mr Brain Wo Contrast  09/05/2014   CLINICAL DATA:  Ongoing dizziness for 1 day, shortness of breath, weakness and mild headache. History of hypertension,  hyperlipidemia, anxiety.  IMPRESSION: No acute ischemia.  A few scattered supratentorial micro hemorrhages, new in the RIGHT occipital lobe may be seen with chronic hypertension, corroborated by intracranial vascular dolichoectasia.  Bilateral cystic corona radiata lacunar infarcts (less likely underlying component demyelination), complex on the RIGHT though, stable from 2014. Tiny cerebellar infarcts. Moderate to severe white matter changes suggest chronic small vessel ischemic disease.   Electronically Signed   By: Elon Alas   On: 09/05/2014 06:53    TELE: SB 50-57 BPM    ASSESSMENT AND PLAN  80 year old female  1. Sinus bradycardia and low atrial rhythm - the patient was on nadolol at home (not clear indication), we will hold it, since it is long acting betablocker it might take up to 72 hours (3 1/2 lives) to washout.  We will order  echocardiogram.  No indication for a pacemaker at this point. She can be admitted to telemetry.  2. Hypertension - controlled with amlodipine 2.5 mg po daily, brain MRI shows microhemorrhages most probably sec to poorly controlled HTN.    3. Hyperlipidemia - on crestor 20 mg po daily  She can be discharged today, she should follow in our clinic within 7-10 days. Family advised that with the degree of dementia she has, she shouldn't be driving.   Signed, Dorothy Spark MD, [facility] 09/06/2014

## 2014-09-06 NOTE — Discharge Summary (Signed)
Discharge Summary   Patient ID: Julia [name], MRN: [id number], DOB/AGE: 1935/05/07 80 y.o.  Admit date: 09/05/2014 Discharge date: 09/06/2014   Primary Care Physician:  Garret Reddish   Primary Cardiologist:  Dr. Ena Dawley    Reason for Admission:  Dizziness, Bradycardia   Primary Discharge Diagnoses:  Principal Problem:   Symptomatic bradycardia Active Problems:   Alzheimer's dementia   Junctional escape rhythm   Dizziness   SOB (shortness of breath)   Hypertension     Wt Readings from Last 3 Encounters:  09/06/14 119 lb 6.4 oz (54.159 kg)  08/13/14 119 lb 1.6 oz (54.023 kg)  08/05/14 118 lb (53.524 kg)    Secondary Discharge Diagnoses:   Past Medical History  Diagnosis Date  . GERD (gastroesophageal reflux disease)   . Hypertension   . Hyperlipidemia   . Osteopenia   . History of colonic polyps   . Right forearm fracture 2011  . Anxiety   . Thyroid disease   . SEBACEOUS CYST, INFECTED 03/16/2009    Qualifier: Diagnosis of  By: Arnoldo Morale MD, [place] with no loss of consciousness 05/14/2008    Qualifier: Diagnosis of  By: Arnoldo Morale MD, Balinda Quails Dementia       Allergies:    Allergies  Allergen Reactions  . Entex Other (See Comments)    unknown  . Sulfonamide Derivatives Other (See Comments)    unknown      Procedures Performed This Admission:   None   Hospital Course:  Julia [name] is a 80 y.o. female with a hx of dementia, GERD, HTN, HL, osteopenia, anxiety,hypothyroidism. She presented to the ED on the day of admission with c/o's constant, ongoing dizziness x 1 day as well as dyspnea, weakness, mild HA.  Her medications included Nadolol 40 mg QD.  ECG demonstrated sinus brady and slow atrial rhythm.  Her beta-blocker was DC'd.  CEs were negative.  Hgb was normal.  UA was unremarkable.  UDS was negative.  CXR with chronic changes.  MRI without acute CVA.  There were old infarcts noted and micro-hemorrhages noted - likely  due to poorly controlled HTN.  She was observed overnight.  This morning she was doing well without further dizziness.  Tele demonstrated sinus brady with HR 50-57.  There was no indication for PPM.  Amlodipine was added for BP control.  She was evaluated by Dr. Ena Dawley this AM and felt to be stable for DC to home.  FU will be arranged in 7-10 days. She will need an outpatient Echo. She has been advised to not drive (due to degree of dementia).     Discharge Vitals:   Blood pressure 133/78, pulse 57, temperature 97.7 F (36.5 C), temperature source Oral, resp. rate 14, height 5\' 4"  (1.626 m), weight 119 lb 6.4 oz (54.159 kg), SpO2 95 %.   Labs:   Recent Labs  09/05/14 0400  WBC 4.5  HGB 13.6  HCT 42.3  MCV 90.6  PLT 191     Recent Labs  09/05/14 0400  NA 140  K 3.7  CL 104  CO2 27  BUN 16  CREATININE 1.17*  CALCIUM 9.6  PROT 6.0  BILITOT 0.8  ALKPHOS 55  ALT 19  AST 32    No results for input(s): CKTOTAL, CKMB, TROPONINI in the last 72 hours.    Recent Labs  09/05/14 0427  TROPIPOC 0.00      Recent Labs  09/05/14  0400  INR 0.95     Diagnostic Procedures and Studies:  Dg Chest 2 View   09/05/2014     IMPRESSION: Emphysematous changes in the lungs. No evidence of active pulmonary disease.   Electronically Signed   By: Lucienne Capers M.D.   On: 09/05/2014 04:52   Mr Brain Wo Contrast  09/05/2014  IMPRESSION: No acute ischemia.  A few scattered supratentorial micro hemorrhages, new in the RIGHT occipital lobe may be seen with chronic hypertension, corroborated by intracranial vascular dolichoectasia.  Bilateral cystic corona radiata lacunar infarcts (less likely underlying component demyelination), complex on the RIGHT though, stable from 2014. Tiny cerebellar infarcts. Moderate to severe white matter changes suggest chronic small vessel ischemic disease.   Electronically Signed   By: Elon Alas   On: 09/05/2014 06:53    Disposition:   Pt is  being discharged home today in good condition.  Follow-up Plans & Appointments      Follow-up Information    Follow up with Garret Reddish, MD. Schedule an appointment as soon as possible for a visit in 3 days.   Specialty:  Family Medicine   Why:  for close follow up of your dizziness   Contact information:   [place] [address] [phone]       Follow up with [facility].   Why:  office will call to arrange an echocardiogram (ultrasound of your heart)   Contact information:   [address] [place] [address]       Follow up with Dorothy Spark, MD In 1 week.   Specialty:  Cardiology   Why:  office will call to arrange an appointment with Dr. Meda Coffee or PA/NP   Contact information:   [place] [address] [phone]       Discharge Medications    Medication List    STOP taking these medications        nadolol 40 MG tablet  Commonly known as:  CORGARD      TAKE these medications        acetaminophen 500 MG tablet  Commonly known as:  TYLENOL  Take 500 mg by mouth every 6 (six) hours as needed for headache.     ALPRAZolam 0.25 MG tablet  Commonly known as:  XANAX  Take 1 tablet (0.25 mg total) by mouth at bedtime as needed for anxiety.     amLODipine 2.5 MG tablet  Commonly known as:  NORVASC  Take 1 tablet (2.5 mg total) by mouth daily.     aspirin 81 MG tablet  Take 81 mg by mouth at bedtime.     Fish Oil 1000 MG Caps  Take 1,000 mg by mouth daily.     galantamine 16 MG 24 hr capsule  Commonly known as:  RAZADYNE ER  TAKE 1 CAPSULE BY MOUTH ONCE DAILY WITH BREAKFAST     Ibuprofen 200 MG Caps  Take 1 capsule (200 mg total) by mouth daily as needed.     imipramine 50 MG tablet  Commonly known as:  TOFRANIL  Take 1 tablets in evening (50mg )     levothyroxine 88 MCG tablet  Commonly known as:  SYNTHROID, LEVOTHROID  Take 1 tablet (88 mcg total)  by mouth daily before breakfast.     memantine 28 MG Cp24 24 hr capsule  Commonly known as:  NAMENDA XR  Take 1 capsule (28 mg total) by mouth  daily.     MYRBETRIQ 25 MG Tb24 tablet  Generic drug:  mirabegron ER  Take 25 mg by mouth daily.     rosuvastatin 20 MG tablet  Commonly known as:  CRESTOR  Take 1 tablet (20 mg total) by mouth once a week.     sertraline 50 MG tablet  Commonly known as:  ZOLOFT  Take one tablet (50 mg total) by mouth daily.     traZODone 50 MG tablet  Commonly known as:  DESYREL  Take 1 tablet (50 mg total) by mouth at bedtime as needed for sleep.         Outstanding Labs/Studies  1. Echocardiogram   Duration of Discharge Encounter: Greater than 30 minutes including physician and PA time.  Signed, Richardson Dopp, PA-C   09/06/2014 2:16 PM

## 2014-09-08 ENCOUNTER — Telehealth: Payer: Self-pay | Admitting: Family Medicine

## 2014-09-08 ENCOUNTER — Telehealth: Payer: Self-pay | Admitting: Cardiology

## 2014-09-08 NOTE — Telephone Encounter (Signed)
11:15 please

## 2014-09-08 NOTE — Telephone Encounter (Signed)
Post hospital.  Patient needs:   1. Echocardiogram. Dx: Dyspnea, dizziness.   2. TCM follow up - 7-10 days. Dr. Ena Dawley or me or [facility].   Richardson Dopp, PA-C   09/06/2014 2:06 PM    Left message for patient to call office and ask for triage nurse.

## 2014-09-08 NOTE — Telephone Encounter (Signed)
Patient contacted regarding discharge from [facility] on 4/9.  Spoke with patient's daughter; pt has hx Alzheimer's   Patient understands to follow up with provider Richardson Dopp, PA-C on 4/21 at 2:40 at [facility]. Patient understands discharge instructions? Yes Patient understands medications and regiment? Yes Patient understands to bring all medications to this visit? Yes  Spoke with patient's daughter who states patient has hx Alzheimer's and lives at home with her husband.  Daughter, Marcie Bal, states she and her sister, Kenney Houseman, take care of patient's medications and fill up her tray weekly.  She is aware that we request they bring all medication bottles to the appointment.  I reviewed symptoms of bradycardia and advised family to call back with questions or concerns.  Marcie Bal verbalized understanding and agreement.

## 2014-09-08 NOTE — Telephone Encounter (Signed)
Pt aware of 11:15 appt. On Friday

## 2014-09-08 NOTE — Telephone Encounter (Signed)
See below, she was admitted for bradycardia and dizziness.

## 2014-09-08 NOTE — Telephone Encounter (Signed)
New message        TOC appt is Thurs 4/21 @ 2:40pm with Nicki Reaper

## 2014-09-08 NOTE — Telephone Encounter (Signed)
Pt daughter called for hosp fup appt for this week. Where can I schedule her. Pt daughter is asking for Friday 09/12/14

## 2014-09-12 ENCOUNTER — Encounter: Payer: Self-pay | Admitting: Family Medicine

## 2014-09-12 ENCOUNTER — Ambulatory Visit (INDEPENDENT_AMBULATORY_CARE_PROVIDER_SITE_OTHER): Payer: Medicare Other | Admitting: Family Medicine

## 2014-09-12 VITALS — BP 140/74 | HR 64 | Temp 97.5°F | Wt 122.0 lb

## 2014-09-12 DIAGNOSIS — R001 Bradycardia, unspecified: Secondary | ICD-10-CM

## 2014-09-12 DIAGNOSIS — F0280 Dementia in other diseases classified elsewhere without behavioral disturbance: Secondary | ICD-10-CM

## 2014-09-12 DIAGNOSIS — G309 Alzheimer's disease, unspecified: Secondary | ICD-10-CM

## 2014-09-12 DIAGNOSIS — I10 Essential (primary) hypertension: Secondary | ICD-10-CM | POA: Diagnosis not present

## 2014-09-12 DIAGNOSIS — Z23 Encounter for immunization: Secondary | ICD-10-CM

## 2014-09-12 DIAGNOSIS — K625 Hemorrhage of anus and rectum: Secondary | ICD-10-CM | POA: Diagnosis not present

## 2014-09-12 DIAGNOSIS — K5900 Constipation, unspecified: Secondary | ICD-10-CM | POA: Diagnosis not present

## 2014-09-12 MED ORDER — AMLODIPINE BESYLATE 5 MG PO TABS: 5.0000 mg | ORAL_TABLET | Freq: Every day | ORAL | Status: DC

## 2014-09-12 MED ORDER — ROSUVASTATIN CALCIUM 20 MG PO TABS: 20.0000 mg | ORAL_TABLET | ORAL | Status: DC

## 2014-09-12 MED ORDER — AMLODIPINE BESYLATE 5 MG PO TABS: 2.5000 mg | ORAL_TABLET | Freq: Every day | ORAL | Status: DC

## 2014-09-12 NOTE — Patient Instructions (Addendum)
Received final pneumonia shot today ([id number]).  Permanently discontinue driving at this point. We will fax in report and give you a copy to mail if you don't hear within a few weeks-you can also call the DMV  Increase amlodipine to 5mg , follow up with cardiology next week. If blood pressure stays over 140/90 consistently please let me know

## 2014-09-12 NOTE — Assessment & Plan Note (Signed)
Symptomatic bradycardia resolved off nadolol but BP increasing on amlodipne 2.5mg --> increase to 5mg  and f/u at cards visit next week as well as home monitoring.

## 2014-09-12 NOTE — Assessment & Plan Note (Signed)
Long discussion with patient and family that we need to permanently discontinue driving at this point as she has been unable to follow instructions on when and where she could drive. Submitted form to [facility] for medical reexamination and gave copy for family to have and take to [facility] if any issues.   >50% of 30 minute office visit was spent on counseling (alzheimer's and need to stop driving) and coordination of care

## 2014-09-12 NOTE — Progress Notes (Signed)
Garret Reddish, MD Phone: [phone]  Subjective:   Julia [name] is a 80 y.o. year old very pleasant female patient who presents with the following:  Hospital Course from discharge summary "Julia [name] is a 80 y.o. female with a hx of dementia, GERD, HTN, HL, osteopenia, anxiety,hypothyroidism. She presented to the ED on the day of admission with c/o's constant, ongoing dizziness x 1 day as well as dyspnea, weakness, mild HA. Her medications included Nadolol 40 mg QD. ECG demonstrated sinus brady and slow atrial rhythm. Her beta-blocker was DC'd. CEs were negative. Hgb was normal. UA was unremarkable. UDS was negative. CXR with chronic changes. MRI without acute CVA. There were old infarcts noted and micro-hemorrhages noted - likely due to poorly controlled HTN. She was observed overnight. This morning she was doing well without further dizziness. Tele demonstrated sinus brady with HR 50-57. There was no indication for PPM. Amlodipine was added for BP control. She was evaluated by Dr. Ena Dawley this AM and felt to be stable for DC to home. FU will be arranged in 7-10 days. She will need an outpatient Echo. She has been advised to not drive (due to degree of dementia)."  Symptomatic bradycardia- resolved -resolved off nadolol. No longer having dizzy, weak episodes. HR per family was in 61s when arrived in ED.  Hypertension-worsening control at home with BP up to 152/78 today and beginning of week in 120s  BP Readings from Last 3 Encounters:  09/12/14 140/74  09/06/14 133/78  08/13/14 128/60   Home BP monitoring-yes family helping Compliant with medications-yes without side effects 2.5mg  amlodipine as substitute for nadolol ROS-Denies any CP, HA, SOB, blurry vision, LE edema.  Alzheimer's dementia- now unsafe to drive She had been instructed only to drive to get her hair done and she drove to ER in middle of night with HR in 30s while dizzy and has  cataracts  Past Medical History- Patient Active Problem List   Diagnosis Date Noted  . Alzheimer's dementia 04/21/2014    Priority: High  . Hypothyroid 11/21/2013    Priority: Medium  . Hyperlipidemia 04/18/2007    Priority: Medium  . Essential hypertension 12/11/2006    Priority: Medium  . Tingling in extremities 07/08/2014    Priority: Low  . Overactive bladder 04/21/2014    Priority: Low  . PALPITATIONS, RECURRENT 12/09/2008    Priority: Low  . GERD 12/11/2006    Priority: Low  . Osteopenia 12/11/2006    Priority: Low  . History of colonic polyps 12/11/2006    Priority: Low  . Symptomatic bradycardia   . Sinus bradycardia 09/05/2014  . Junctional escape rhythm 09/05/2014  . Dizziness 09/05/2014  . SOB (shortness of breath) 09/05/2014  . Hypertension 09/05/2014  . Bradycardia 09/05/2014   Medications- reviewed and updated Current Outpatient Prescriptions  Medication Sig Dispense Refill  . amLODipine (NORVASC) 2.5 MG tablet Take 1 tablet (2.5 mg total) by mouth daily. 30 tablet 5  . aspirin 81 MG tablet Take 81 mg by mouth at bedtime.     Marland Kitchen galantamine (RAZADYNE ER) 16 MG 24 hr capsule TAKE 1 CAPSULE BY MOUTH ONCE DAILY WITH BREAKFAST 30 capsule 2  . imipramine (TOFRANIL) 50 MG tablet Take 1 tablets in evening (50mg ) (Patient taking differently: Take 50 mg by mouth every evening. Take 1 tablets in evening (50mg )) 90 tablet 3  . levothyroxine (SYNTHROID, LEVOTHROID) 88 MCG tablet Take 1 tablet (88 mcg total) by mouth daily before breakfast. 90 tablet 1  .  memantine (NAMENDA XR) 28 MG CP24 24 hr capsule Take 1 capsule (28 mg total) by mouth daily. 30 capsule 6  . mirabegron ER (MYRBETRIQ) 25 MG TB24 tablet Take 25 mg by mouth daily.    . Omega-3 Fatty Acids (FISH OIL) 1000 MG CAPS Take 1,000 mg by mouth daily.    . rosuvastatin (CRESTOR) 20 MG tablet Take 1 tablet (20 mg total) by mouth once a week. (Patient taking differently: Take 20 mg by mouth every Monday. ) 90 tablet  3  . sertraline (ZOLOFT) 50 MG tablet Take one tablet (50 mg total) by mouth daily. 30 tablet 6  . acetaminophen (TYLENOL) 500 MG tablet Take 500 mg by mouth every 6 (six) hours as needed for headache.    . ALPRAZolam (XANAX) 0.25 MG tablet Take 1 tablet (0.25 mg total) by mouth at bedtime as needed for anxiety. (Patient not taking: Reported on 09/12/2014) 60 tablet 0  . Ibuprofen 200 MG CAPS Take 1 capsule (200 mg total) by mouth daily as needed. (Patient not taking: Reported on 09/12/2014) 120 each 0  . traZODone (DESYREL) 50 MG tablet Take 1 tablet (50 mg total) by mouth at bedtime as needed for sleep. (Patient not taking: Reported on 09/12/2014) 30 tablet 3   Objective: BP 140/74 mmHg  Pulse 64  Temp(Src) 97.5 F (36.4 C)  Wt 122 lb (55.339 kg) Gen: NAD, resting comfortably CV: RRR no murmurs rubs or gallops   Assessment/Plan:  Essential hypertension Symptomatic bradycardia resolved off nadolol but BP increasing on amlodipne 2.5mg --> increase to 5mg  and f/u at cards visit next week as well as home monitoring.    Alzheimer's dementia Long discussion with patient and family that we need to permanently discontinue driving at this point as she has been unable to follow instructions on when and where she could drive. Submitted form to [facility] for medical reexamination and gave copy for family to have and take to [facility] if any issues.   >50% of 30 minute office visit was spent on counseling (alzheimer's and need to stop driving) and coordination of care  Orders Placed This Encounter  Procedures  . Pneumococcal conjugate vaccine 13-valent    Meds ordered this encounter  Medications  . rosuvastatin (CRESTOR) 20 MG tablet    Sig: Take 1 tablet (20 mg total) by mouth once a week.    Dispense:  15 tablet    Refill:  3  . amLODipine (NORVASC) 5 MG tablet    Sig: Take 1 tablet (5 mg total) by mouth daily. Please ignore prior 2.5mg  rx, updated rx should be 5mg  daily.    Dispense:  30 tablet     Refill:  5

## 2014-09-18 ENCOUNTER — Encounter: Payer: Self-pay | Admitting: Physician Assistant

## 2014-09-18 ENCOUNTER — Telehealth: Payer: Self-pay | Admitting: *Deleted

## 2014-09-18 ENCOUNTER — Ambulatory Visit (HOSPITAL_COMMUNITY): Payer: Medicare Other | Attending: Cardiology

## 2014-09-18 ENCOUNTER — Ambulatory Visit (INDEPENDENT_AMBULATORY_CARE_PROVIDER_SITE_OTHER): Payer: Medicare Other | Admitting: Physician Assistant

## 2014-09-18 VITALS — BP 130/70 | HR 72 | Ht 64.0 in | Wt 119.0 lb

## 2014-09-18 DIAGNOSIS — E785 Hyperlipidemia, unspecified: Secondary | ICD-10-CM

## 2014-09-18 DIAGNOSIS — R0600 Dyspnea, unspecified: Secondary | ICD-10-CM | POA: Diagnosis not present

## 2014-09-18 DIAGNOSIS — R002 Palpitations: Secondary | ICD-10-CM | POA: Insufficient documentation

## 2014-09-18 DIAGNOSIS — I10 Essential (primary) hypertension: Secondary | ICD-10-CM

## 2014-09-18 DIAGNOSIS — R0602 Shortness of breath: Secondary | ICD-10-CM

## 2014-09-18 DIAGNOSIS — G309 Alzheimer's disease, unspecified: Secondary | ICD-10-CM

## 2014-09-18 DIAGNOSIS — R42 Dizziness and giddiness: Secondary | ICD-10-CM | POA: Insufficient documentation

## 2014-09-18 DIAGNOSIS — K219 Gastro-esophageal reflux disease without esophagitis: Secondary | ICD-10-CM | POA: Insufficient documentation

## 2014-09-18 DIAGNOSIS — R001 Bradycardia, unspecified: Secondary | ICD-10-CM | POA: Diagnosis not present

## 2014-09-18 DIAGNOSIS — F0280 Dementia in other diseases classified elsewhere without behavioral disturbance: Secondary | ICD-10-CM

## 2014-09-18 DIAGNOSIS — E039 Hypothyroidism, unspecified: Secondary | ICD-10-CM | POA: Insufficient documentation

## 2014-09-18 NOTE — Progress Notes (Signed)
Cardiology Office Note   Date:  09/18/2014   ID:  Julia [name], DOB May 25, 1935, MRN [id number]  PCP:  Julia Reddish, MD  Cardiologist:  Dr. Ena Dawley     Chief Complaint  Patient presents with  . Bradycardia    Hospital Follow up     History of Present Illness: Julia [name] is a 80 y.o. female with a hx of dementia, GERD, HTN, HL, osteopenia, anxiety,hypothyroidism. She was admitted 4/8-4/9 with c/o's constant, ongoing dizziness x 1 day as well as dyspnea, weakness, mild HA. Her medications included Nadolol 40 mg QD. ECG demonstrated sinus brady and slow atrial rhythm. Her beta-blocker was DC'd. CEs were negative. Hgb was normal. UA was unremarkable. UDS was negative. CXR with chronic changes. MRI without acute CVA. There were old infarcts noted and micro-hemorrhages noted - likely due to poorly controlled HTN. She was observed overnight and was without further dizziness. Tele demonstrated sinus brady with HR 50-57. There was no indication for PPM. Amlodipine was added for BP control. Outpatient Echo is to be arranged.   She returns for FU.  She is here with her daughter.  She is quite upset that her driving privileges have been revoked.  She has a large property in [employer].  She does a lot of walking. She denies chest pain or dyspnea.  No orthopnea, PND, edema.  She denies any further dizziness, near syncope.  Denies syncope.     Studies/Reports Reviewed Today:  None  Past Medical History  Diagnosis Date  . GERD (gastroesophageal reflux disease)   . Hypertension   . Hyperlipidemia   . Osteopenia   . History of colonic polyps   . Right forearm fracture 2011  . Anxiety   . Thyroid disease   . SEBACEOUS CYST, INFECTED 03/16/2009    Qualifier: Diagnosis of  By: Arnoldo Morale MD, [place] with no loss of consciousness 05/14/2008    Qualifier: Diagnosis of  By: Arnoldo Morale MD, Balinda Quails Dementia     Past Surgical History  Procedure  Laterality Date  . Rotator cuff repair      right shoulder  . Bladder tack    . Tonsillectomy    . Bilateral salpingoophorectomy    . Surgical repair to fx left wrist    . Abdominal hysterectomy  2009     Current Outpatient Prescriptions  Medication Sig Dispense Refill  . acetaminophen (TYLENOL) 500 MG tablet Take 500 mg by mouth every 6 (six) hours as needed for headache.    . ALPRAZolam (XANAX) 0.25 MG tablet Take 1 tablet (0.25 mg total) by mouth at bedtime as needed for anxiety. 60 tablet 0  . amLODipine (NORVASC) 5 MG tablet Take 1 tablet (5 mg total) by mouth daily. Please ignore prior 2.5mg  rx, updated rx should be 5mg  daily. 30 tablet 5  . aspirin 81 MG tablet Take 81 mg by mouth at bedtime.     Marland Kitchen galantamine (RAZADYNE ER) 16 MG 24 hr capsule TAKE 1 CAPSULE BY MOUTH ONCE DAILY WITH BREAKFAST 30 capsule 2  . Ibuprofen 200 MG CAPS Take 1 capsule (200 mg total) by mouth daily as needed. 120 each 0  . imipramine (TOFRANIL) 50 MG tablet Take 1 tablets in evening (50mg ) (Patient taking differently: Take 50 mg by mouth every evening. Take 1 tablets in evening (50mg )) 90 tablet 3  . levothyroxine (SYNTHROID, LEVOTHROID) 88 MCG tablet Take 1 tablet (88 mcg total) by mouth daily  before breakfast. 90 tablet 1  . memantine (NAMENDA XR) 28 MG CP24 24 hr capsule Take 1 capsule (28 mg total) by mouth daily. 30 capsule 6  . mirabegron ER (MYRBETRIQ) 25 MG TB24 tablet Take 25 mg by mouth daily.    . Omega-3 Fatty Acids (FISH OIL) 1000 MG CAPS Take 1,000 mg by mouth daily.    . polyethylene glycol (MIRALAX / GLYCOLAX) packet Take 17 g by mouth daily.    . rosuvastatin (CRESTOR) 20 MG tablet Take 1 tablet (20 mg total) by mouth once a week. 15 tablet 3  . sertraline (ZOLOFT) 50 MG tablet Take one tablet (50 mg total) by mouth daily. 30 tablet 6  . traZODone (DESYREL) 50 MG tablet Take 1 tablet (50 mg total) by mouth at bedtime as needed for sleep. 30 tablet 3   No current facility-administered  medications for this visit.    Allergies:   Entex and Sulfonamide derivatives    Social History:  The patient  reports that she has never smoked. She has never used smokeless tobacco. She reports that she does not drink alcohol or use illicit drugs.   Family History:  The patient's family history includes Alzheimer's disease in her maternal aunt; COPD in her mother; Cancer in her father and maternal uncle; Drug abuse in her maternal uncle; Heart disease in her brother; Hypertension in her mother; Lung cancer in an other family member; Stomach cancer in her father. There is no history of Colon cancer, Esophageal cancer, Rectal cancer, Heart attack, or Stroke.    ROS:   Please see the history of present illness.   Review of Systems  Constitution: Positive for malaise/fatigue.  HENT: Positive for headaches and hearing loss.   Gastrointestinal: Positive for constipation.  Neurological: Positive for dizziness and loss of balance.  Psychiatric/Behavioral: Positive for depression. The patient is nervous/anxious.   All other systems reviewed and are negative.    PHYSICAL EXAM: VS:  BP 130/70 mmHg  Pulse 72  Ht 5\' 4"  (1.626 m)  Wt 119 lb (53.978 kg)  BMI 20.42 kg/m2    Wt Readings from Last 3 Encounters:  09/18/14 119 lb (53.978 kg)  09/12/14 122 lb (55.339 kg)  09/06/14 119 lb 6.4 oz (54.159 kg)     GEN: Well nourished, well developed, in no acute distress HEENT: normal Neck: no JVD, no carotid bruits, no masses Cardiac:  Normal S1/S2, RRR; no murmur ,  no rubs or gallops, no edema  Respiratory:  clear to auscultation bilaterally, no wheezing, rhonchi or rales. GI: soft, nontender, nondistended, + BS MS: no deformity or atrophy Skin: warm and dry  Neuro:  CNs II-XII intact, Strength and sensation are intact Psych: Normal affect   EKG:  EKG is ordered today.  It demonstrates:   NSR, HR 72, LAD, NSSTTW changes, no change from prior tracing.   Recent Labs: 07/08/2014: TSH  1.09 09/05/2014: ALT 19; BUN 16; Creatinine 1.17*; Hemoglobin 13.6; Platelets 191; Potassium 3.7; Sodium 140    Lipid Panel    Component Value Date/Time   CHOL 189 04/03/2013 1240   TRIG 134.0 04/03/2013 1240   HDL 61.20 04/03/2013 1240   CHOLHDL 3 04/03/2013 1240   VLDL 26.8 04/03/2013 1240   LDLCALC 101* 04/03/2013 1240   LDLDIRECT 148.5 06/25/2012 1308      ASSESSMENT AND PLAN:  Sinus bradycardia Resolved.  This was likely related to the combination of Nadolol and Razadyne.  HR is now in the 70s and she has  had no further symptoms. Echo is pending from today. If this is normal, she will require no further workup.  Essential hypertension Controlled.  Continue current Rx.  Hyperlipidemia Managed by PCP. Continue statin.  Alzheimer's dementia  I tried to reassure her that not driving is in her best interest.  She drove herself to the hospital and her family did not know where she was for several hours.  I advised her daughter to check with the [place] to see if there is a way to add an emergency number to contact if she is lost.   Current medicines are reviewed at length with the patient today.  Concerns regarding medicines are as outlined above.  The following changes have been made:    None    Labs/ tests ordered today include:   Orders Placed This Encounter  Procedures  . EKG 12-Lead    Disposition:   FU with Dr. Ena Dawley as needed.    Signed, Versie Starks, MHS 09/18/2014 2:44 PM    [facility] [place], [place], [place]  [address] Phone: [phone]; Fax: [phone]

## 2014-09-18 NOTE — Telephone Encounter (Signed)
FYI:Pt states she feels its unfair that you changed her mind regarding her driving after you told her she could drive to the beauty shop and its not fair that her kids have to care for her. She states Dr. Arnoldo Morale would have never taken her license away b/c she is in good health and can help herself and is caring for her husband. She states she is wanting to find Dr. Arnoldo Morale and report this to him and to see if he will resume care over her again. I advised pt that Dr. Yong Channel does not want her driving due to her medical condition and safety of her and other drivers. I informed pt that we would be here for her if she needed she states she dosent know what else we can do for her but thanked me for my call/interest.

## 2014-09-18 NOTE — Patient Instructions (Signed)
Medication Instructions:  Your physician recommends that you continue on your current medications as directed. Please refer to the Current Medication list given to you today.   Labwork: None at this time  Testing/Procedures: None at this tiime  Follow-Up: FOLLOW UP WITH DR Meda Coffee AS NEEED   Any Other Special Instructions Will Be Listed Below (If Applicable).

## 2014-09-18 NOTE — Telephone Encounter (Signed)
PLEASE NOTE: All timestamps contained within this report are represented as [place] Standard Time. CONFIDENTIALTY NOTICE: This fax transmission is intended only for the addressee. It contains information that is legally privileged, confidential or otherwise protected from use or disclosure. If you are not the intended recipient, you are strictly prohibited from reviewing, disclosing, copying using or disseminating any of this information or taking any action in reliance on or regarding this information. If you have received this fax in error, please notify us immediately by telephone so that we can arrange for its return to [place]. Phone: [phone], Toll-Free: [phone], Fax: [phone] Page: 1 of 1 Call Id: [id number] [facility] Night - Client [place] Patient Name: Julia [name] Gender: Female DOB: 12/25/1934 Age: 80 Y 38 M 13 D Return Phone Number: [phone] (Primary) Address: City/State/Zip: [place] [address] Client [facility] Night - Client Client Site [facility] - Night Physician Garret Reddish Contact Type Call Caller Name [place] Phone Number [phone] Relationship To Patient Self Is this call to report lab results? No Call Type General Information Initial Comment Caller states she is upset about Dr. Yong Channel saying that she cannot drive anymore. Can someone call her regarding this issue. General Information Type Message Only Nurse Assessment Guidelines Guideline Title Affirmed Question Affirmed Notes Nurse Date/Time (Eastern Time) Disp. Time Eilene Ghazi Time) Disposition Final User 09/13/2014 7:31:47 AM General Information Provided Yes Laney Pastor After Care Instructions Given Call Event Type User Date / Time Description

## 2014-09-18 NOTE — Progress Notes (Signed)
2D Echo completed. 09/18/2014

## 2014-09-23 ENCOUNTER — Encounter: Payer: Self-pay | Admitting: Physician Assistant

## 2014-09-23 ENCOUNTER — Telehealth: Payer: Self-pay | Admitting: Physician Assistant

## 2014-09-23 NOTE — Telephone Encounter (Signed)
Please notify patient echocardiogram demonstrates normal LV function and no significant valvular abnormalities. Continue with current treatment plan. Richardson Dopp, PA-C   09/23/2014 4:15 PM

## 2014-09-23 NOTE — Telephone Encounter (Signed)
Pt made aware of results no questions at this time.  For ECHO results.  Please notify patient echocardiogram demonstrates normal LV function and no significant valvular abnormalities. Continue with current treatment plan. Richardson Dopp, PA-C  09/23/2014 4:15 PM

## 2014-10-22 ENCOUNTER — Telehealth: Payer: Self-pay | Admitting: Neurology

## 2014-10-22 NOTE — Telephone Encounter (Signed)
Office visit was mailed to patient.per their request

## 2014-10-22 NOTE — Telephone Encounter (Signed)
Pt called and would like a call back at [phone]/[facility], regards license to drive

## 2014-10-22 NOTE — Telephone Encounter (Signed)
Pt called and said she has an appointment in September but wanted to come in earlier to talk to Dr Tomi Likens because she said she wants to be able to drive just a little bit/Julia [name]

## 2014-10-22 NOTE — Telephone Encounter (Signed)
Per Dr Tomi Likens it is ok for this patient to drive to the [employer] next to her home as well as the [employer] . However she should drive during daylight hours only 40 mph and daughters should accompany her from time to time while she is driving

## 2014-10-25 DIAGNOSIS — Z7982 Long term (current) use of aspirin: Secondary | ICD-10-CM | POA: Insufficient documentation

## 2014-10-25 DIAGNOSIS — Z8739 Personal history of other diseases of the musculoskeletal system and connective tissue: Secondary | ICD-10-CM | POA: Diagnosis not present

## 2014-10-25 DIAGNOSIS — R51 Headache: Secondary | ICD-10-CM | POA: Diagnosis present

## 2014-10-25 DIAGNOSIS — E079 Disorder of thyroid, unspecified: Secondary | ICD-10-CM | POA: Insufficient documentation

## 2014-10-25 DIAGNOSIS — Z8781 Personal history of (healed) traumatic fracture: Secondary | ICD-10-CM | POA: Insufficient documentation

## 2014-10-25 DIAGNOSIS — Z86010 Personal history of colonic polyps: Secondary | ICD-10-CM | POA: Diagnosis not present

## 2014-10-25 DIAGNOSIS — I10 Essential (primary) hypertension: Secondary | ICD-10-CM | POA: Diagnosis not present

## 2014-10-25 DIAGNOSIS — Z872 Personal history of diseases of the skin and subcutaneous tissue: Secondary | ICD-10-CM | POA: Insufficient documentation

## 2014-10-25 DIAGNOSIS — Z87828 Personal history of other (healed) physical injury and trauma: Secondary | ICD-10-CM | POA: Insufficient documentation

## 2014-10-25 DIAGNOSIS — Z79899 Other long term (current) drug therapy: Secondary | ICD-10-CM | POA: Insufficient documentation

## 2014-10-25 DIAGNOSIS — Z8719 Personal history of other diseases of the digestive system: Secondary | ICD-10-CM | POA: Diagnosis not present

## 2014-10-25 DIAGNOSIS — F0390 Unspecified dementia without behavioral disturbance: Secondary | ICD-10-CM | POA: Diagnosis not present

## 2014-10-25 DIAGNOSIS — E785 Hyperlipidemia, unspecified: Secondary | ICD-10-CM | POA: Diagnosis not present

## 2014-10-25 DIAGNOSIS — F419 Anxiety disorder, unspecified: Secondary | ICD-10-CM | POA: Diagnosis not present

## 2014-10-26 ENCOUNTER — Encounter (HOSPITAL_COMMUNITY): Payer: Self-pay | Admitting: Emergency Medicine

## 2014-10-26 ENCOUNTER — Emergency Department (HOSPITAL_COMMUNITY)
Admission: EM | Admit: 2014-10-26 | Discharge: 2014-10-26 | Disposition: A | Discharge status: Home / Self Care | Payer: Medicare Other | Attending: Emergency Medicine | Admitting: Emergency Medicine

## 2014-10-26 DIAGNOSIS — R51 Headache: Secondary | ICD-10-CM

## 2014-10-26 DIAGNOSIS — R519 Headache, unspecified: Secondary | ICD-10-CM

## 2014-10-26 MED ORDER — DIPHENHYDRAMINE HCL 50 MG/ML IJ SOLN
25.0000 mg | Freq: Once | INTRAMUSCULAR | Status: AC
  Administered 2014-10-26: 25 mg via INTRAVENOUS
  Filled 2014-10-26: qty 1

## 2014-10-26 MED ORDER — METOCLOPRAMIDE HCL 5 MG/ML IJ SOLN
5.0000 mg | Freq: Once | INTRAMUSCULAR | Status: AC
  Administered 2014-10-26: 5 mg via INTRAVENOUS
  Filled 2014-10-26: qty 2

## 2014-10-26 MED ORDER — SODIUM CHLORIDE 0.9 % IV BOLUS (SEPSIS)
500.0000 mL | Freq: Once | INTRAVENOUS | Status: AC
  Administered 2014-10-26: 500 mL via INTRAVENOUS

## 2014-10-26 NOTE — ED Provider Notes (Signed)
CSN: [id number]     Arrival date & time 10/25/14  2356 History   First MD Initiated Contact with Patient 10/26/14 0057     Chief Complaint  Patient presents with  . Migraine     (Consider location/radiation/quality/duration/timing/severity/associated sxs/prior Treatment) HPI  This is a 80 year old female who reports a history of migraines in the past. She is here with a headache that began yesterday afternoon. The pain is located in her temples bilaterally and is throbbing. Pain is moderate to severe. It is characterized as like prior migraines. She has taken Tylenol without relief. There is no associated nausea or vomiting. There is some mild photophobia but no blurred vision. There is no focal neurologic deficit.  Past Medical History  Diagnosis Date  . GERD (gastroesophageal reflux disease)   . Hypertension   . Hyperlipidemia   . Osteopenia   . History of colonic polyps   . Right forearm fracture 2011  . Anxiety   . Thyroid disease   . SEBACEOUS CYST, INFECTED 03/16/2009    Qualifier: Diagnosis of  By: Arnoldo Morale MD, [place] with no loss of consciousness 05/14/2008    Qualifier: Diagnosis of  By: Arnoldo Morale MD, Balinda Quails   . Dementia   . Hx of echocardiogram     Echo 4/16:  EF 55-60%, mild LVH   Past Surgical History  Procedure Laterality Date  . Rotator cuff repair      right shoulder  . Bladder tack    . Tonsillectomy    . Bilateral salpingoophorectomy    . Surgical repair to fx left wrist    . Abdominal hysterectomy  2009   Family History  Problem Relation Age of Onset  . Lung cancer    . Hypertension Mother   . COPD Mother   . Cancer Father   . Stomach cancer Father   . Heart disease Brother   . Alzheimer's disease Maternal Aunt   . Drug abuse Maternal Uncle   . Cancer Maternal Uncle   . Colon cancer Neg Hx   . Esophageal cancer Neg Hx   . Rectal cancer Neg Hx   . Heart attack Neg Hx   . Stroke Neg Hx    History  Substance Use Topics  . Smoking  status: Never Smoker   . Smokeless tobacco: Never Used  . Alcohol Use: No   OB History    No data available     Review of Systems  All other systems reviewed and are negative.   Allergies  Entex and Sulfonamide derivatives  Home Medications   Prior to Admission medications   Medication Sig Start Date End Date Taking? Authorizing Provider  acetaminophen (TYLENOL) 500 MG tablet Take 500 mg by mouth every 6 (six) hours as needed for headache.    Historical Provider, MD  ALPRAZolam Duanne Moron) 0.25 MG tablet Take 1 tablet (0.25 mg total) by mouth at bedtime as needed for anxiety. 02/25/14   Marin Olp, MD  amLODipine (NORVASC) 5 MG tablet Take 1 tablet (5 mg total) by mouth daily. Please ignore prior 2.5mg  rx, updated rx should be 5mg  daily. 09/12/14   Marin Olp, MD  aspirin 81 MG tablet Take 81 mg by mouth at bedtime.     Historical Provider, MD  galantamine (RAZADYNE ER) 16 MG 24 hr capsule TAKE 1 CAPSULE BY MOUTH ONCE DAILY WITH BREAKFAST 07/22/14   Pieter Partridge, DO  Ibuprofen 200 MG CAPS Take 1 capsule (200 mg  total) by mouth daily as needed. 09/06/14   Liliane Shi, PA-C  imipramine (TOFRANIL) 50 MG tablet Take 1 tablets in evening (50mg ) Patient taking differently: Take 50 mg by mouth every evening. Take 1 tablets in evening (50mg ) 08/05/14   Marin Olp, MD  levothyroxine (SYNTHROID, LEVOTHROID) 88 MCG tablet Take 1 tablet (88 mcg total) by mouth daily before breakfast. 05/07/14   Marin Olp, MD  memantine (NAMENDA XR) 28 MG CP24 24 hr capsule Take 1 capsule (28 mg total) by mouth daily. 08/07/14   Pieter Partridge, DO  mirabegron ER (MYRBETRIQ) 25 MG TB24 tablet Take 25 mg by mouth daily.    Historical Provider, MD  Omega-3 Fatty Acids (FISH OIL) 1000 MG CAPS Take 1,000 mg by mouth daily.    Historical Provider, MD  polyethylene glycol (MIRALAX / GLYCOLAX) packet Take 17 g by mouth daily.    Historical Provider, MD  rosuvastatin (CRESTOR) 20 MG tablet Take 1 tablet (20 mg  total) by mouth once a week. 09/12/14   Marin Olp, MD  sertraline (ZOLOFT) 50 MG tablet Take one tablet (50 mg total) by mouth daily. 08/07/14   Pieter Partridge, DO  traZODone (DESYREL) 50 MG tablet Take 1 tablet (50 mg total) by mouth at bedtime as needed for sleep. 08/05/14   Marin Olp, MD   BP 141/73 mmHg  Pulse 78  Temp(Src) 97.9 F (36.6 C) (Oral)  Resp 20  SpO2 100%   Physical Exam  General: Well-developed, well-nourished female in no acute distress; appearance consistent with age of record HENT: normocephalic; atraumatic Eyes: pupils equal, round and reactive to light; extraocular muscles intact Neck: supple Heart: regular rate and rhythm Lungs: clear to auscultation bilaterally Abdomen: soft; nondistended; nontender; bowel sounds present Extremities: No deformity; full range of motion; pulses normal Neurologic: Awake, alert and oriented; motor function intact in all extremities and symmetric; no facial droop Skin: Warm and dry Psychiatric: Normal mood and affect    ED Course  Procedures (including critical care time)   MDM  3:20 AM Headache relieved after IV fluids and medications.    Shanon Rosser, MD 10/26/14 [id number]

## 2014-10-26 NOTE — ED Notes (Signed)
Pt presents with c/o HA onset yesterday without n/v/d. Pt does have hx of same, pt tearful stating she is having difficulty time getting DL back and grieving from loss of daughter 2 years ago.

## 2014-10-29 ENCOUNTER — Telehealth: Payer: Self-pay | Admitting: *Deleted

## 2014-10-29 ENCOUNTER — Other Ambulatory Visit: Payer: Self-pay | Admitting: Neurology

## 2014-10-29 NOTE — Telephone Encounter (Signed)
I spoke with patients daughter [name] and advised per DR  Tomi Likens patient should not be driving I have  Left her letter up front the daughter will pick up today

## 2014-11-06 ENCOUNTER — Other Ambulatory Visit: Payer: Self-pay | Admitting: *Deleted

## 2014-11-06 ENCOUNTER — Telehealth: Payer: Self-pay | Admitting: Neurology

## 2014-11-06 ENCOUNTER — Other Ambulatory Visit: Payer: Self-pay | Admitting: Family Medicine

## 2014-11-06 MED ORDER — GALANTAMINE HYDROBROMIDE ER 16 MG PO CP24: 16.0000 mg | ORAL_CAPSULE | Freq: Every day | ORAL | Status: DC

## 2014-11-06 MED ORDER — LEVOTHYROXINE SODIUM 88 MCG PO TABS: 88.0000 ug | ORAL_TABLET | Freq: Every day | ORAL | Status: DC

## 2014-11-06 NOTE — Telephone Encounter (Signed)
Rx sent to the pharmacy.

## 2014-11-06 NOTE — Telephone Encounter (Signed)
Last TSH in Feb 2016.  Filled for 6 months

## 2014-11-06 NOTE — Telephone Encounter (Signed)
Pt's daughter called to state that her mother was out of her medication galantamine and needs a refill called in/Dawn  CB# [phone]

## 2014-11-10 ENCOUNTER — Ambulatory Visit (INDEPENDENT_AMBULATORY_CARE_PROVIDER_SITE_OTHER): Payer: 59 | Admitting: Emergency Medicine

## 2014-11-10 VITALS — BP 120/86 | HR 71 | Temp 97.5°F | Resp 17 | Ht 63.5 in | Wt 118.0 lb

## 2014-11-10 DIAGNOSIS — T148 Other injury of unspecified body region: Secondary | ICD-10-CM

## 2014-11-10 DIAGNOSIS — W57XXXA Bitten or stung by nonvenomous insect and other nonvenomous arthropods, initial encounter: Secondary | ICD-10-CM

## 2014-11-10 DIAGNOSIS — L720 Epidermal cyst: Secondary | ICD-10-CM | POA: Diagnosis not present

## 2014-11-10 MED ORDER — MUPIROCIN 2 % EX OINT: TOPICAL_OINTMENT | CUTANEOUS | Status: DC

## 2014-11-10 MED ORDER — DOXYCYCLINE HYCLATE 100 MG PO TABS: ORAL_TABLET | ORAL | Status: DC

## 2014-11-10 NOTE — Patient Instructions (Signed)
Tick Bite Information Ticks are insects that attach themselves to the skin and draw blood for food. There are various types of ticks. Common types include wood ticks and deer ticks. Most ticks live in shrubs and grassy areas. Ticks can climb onto your body when you make contact with leaves or grass where the tick is waiting. The most common places on the body for ticks to attach themselves are the scalp, neck, armpits, waist, and groin. Most tick bites are harmless, but sometimes ticks carry germs that cause diseases. These germs can be spread to a person during the tick's feeding process. The chance of a disease spreading through a tick bite depends on:   The type of tick.  Time of year.   How long the tick is attached.   Geographic location.  HOW CAN YOU PREVENT TICK BITES? Take these steps to help prevent tick bites when you are outdoors:  Wear protective clothing. Long sleeves and long pants are best.   Wear white clothes so you can see ticks more easily.  Tuck your pant legs into your socks.   If walking on a trail, stay in the middle of the trail to avoid brushing against bushes.  Avoid walking through areas with long grass.  Put insect repellent on all exposed skin and along boot tops, pant legs, and sleeve cuffs.   Check clothing, hair, and skin repeatedly and before going inside.   Brush off any ticks that are not attached.  Take a shower or bath as soon as possible after being outdoors.  WHAT IS THE PROPER WAY TO REMOVE A TICK? Ticks should be removed as soon as possible to help prevent diseases caused by tick bites. 1. If latex gloves are available, put them on before trying to remove a tick.  2. Using fine-point tweezers, grasp the tick as close to the skin as possible. You may also use curved forceps or a tick removal tool. Grasp the tick as close to its head as possible. Avoid grasping the tick on its body. 3. Pull gently with steady upward pressure until  the tick lets go. Do not twist the tick or jerk it suddenly. This may break off the tick's head or mouth parts. 4. Do not squeeze or crush the tick's body. This could force disease-carrying fluids from the tick into your body.  5. After the tick is removed, wash the bite area and your hands with soap and water or other disinfectant such as alcohol. 6. Apply a small amount of antiseptic cream or ointment to the bite site.  7. Wash and disinfect any instruments that were used.  Do not try to remove a tick by applying a hot match, petroleum jelly, or fingernail polish to the tick. These methods do not work and may increase the chances of disease being spread from the tick bite.  WHEN SHOULD YOU SEEK MEDICAL CARE? Contact your health care provider if you are unable to remove a tick from your skin or if a part of the tick breaks off and is stuck in the skin.  After a tick bite, you need to be aware of signs and symptoms that could be related to diseases spread by ticks. Contact your health care provider if you develop any of the following in the days or weeks after the tick bite:  Unexplained fever.  Rash. A circular rash that appears days or weeks after the tick bite may indicate the possibility of Lyme disease. The rash may resemble   a target with a bull's-eye and may occur at a different part of your body than the tick bite.  Redness and swelling in the area of the tick bite.   Tender, swollen lymph glands.   Diarrhea.   Weight loss.   Cough.   Fatigue.   Muscle, joint, or bone pain.   Abdominal pain.   Headache.   Lethargy or a change in your level of consciousness.  Difficulty walking or moving your legs.   Numbness in the legs.   Paralysis.  Shortness of breath.   Confusion.   Repeated vomiting.  Document Released: 05/13/2000 Document Revised: 03/06/2013 Document Reviewed: 10/24/2012 Exit[employer] Patient Information 2015 [employer]. This information is  not intended to replace advice given to you by your health care provider. Make sure you discuss any questions you have with your health care provider.  

## 2014-11-10 NOTE — Progress Notes (Signed)
Subjective:  This chart was scribed for Nena Jordan, MD by Thea Alken, ED Scribe. This patient was seen in room 2 and the patient's care was started at 10:45 AM.  Patient ID: Julia [name], female    DOB: [birth year], 80 y.o.   MRN: [id number]  HPI Chief Complaint  Patient presents with  . Tick Removal    left foot x1 day   . Foot Pain   HPI Comments: Julia [name] is a 80 y.o. female who presents to the [facility] complaining of a tick bite to left foot x 3 days ago. Pt report she was bit by a small tick 3 days ago and states she removed it 2 days ago. Pt has been walking outside for exercise. States there is now a knot present at the site of the bite. She has applied benadryl cream to area. Pt denies drug allergies.   Pt also c/o of a cyst to right neck that has been present for 6 month. Pt has been seen by Dr. Rosalita Chessman in the past.   Past Medical History  Diagnosis Date  . GERD (gastroesophageal reflux disease)   . Hypertension   . Hyperlipidemia   . Osteopenia   . History of colonic polyps   . Right forearm fracture 2011  . Anxiety   . Thyroid disease   . SEBACEOUS CYST, INFECTED 03/16/2009    Qualifier: Diagnosis of  By: Arnoldo Morale MD, [place] with no loss of consciousness 05/14/2008    Qualifier: Diagnosis of  By: Arnoldo Morale MD, Balinda Quails   . Dementia   . Hx of echocardiogram     Echo 4/16:  EF 55-60%, mild LVH   Allergies  Allergen Reactions  . Entex Other (See Comments)    unknown  . Sulfonamide Derivatives Other (See Comments)    unknown   Prior to Admission medications   Medication Sig Start Date End Date Taking? Authorizing Provider  ALPRAZolam (XANAX) 0.25 MG tablet Take 1 tablet (0.25 mg total) by mouth at bedtime as needed for anxiety. 02/25/14  Yes Marin Olp, MD  amLODipine (NORVASC) 5 MG tablet Take 1 tablet (5 mg total) by mouth daily. Please ignore prior 2.5mg  rx, updated rx should be 5mg  daily. 09/12/14  Yes Marin Olp, MD  aspirin 81 MG tablet Take 81 mg by mouth at bedtime.    Yes Historical Provider, MD  galantamine (RAZADYNE ER) 16 MG 24 hr capsule Take 1 capsule (16 mg total) by mouth daily with breakfast. 11/06/14  Yes Pieter Partridge, DO  Ibuprofen 200 MG CAPS Take 1 capsule (200 mg total) by mouth daily as needed. 09/06/14  Yes Scott T Kathlen Mody, PA-C  imipramine (TOFRANIL) 50 MG tablet Take 1 tablets in evening (50mg ) Patient taking differently: Take 50 mg by mouth every evening. Take 1 tablets in evening (50mg ) 08/05/14  Yes Marin Olp, MD  levothyroxine (SYNTHROID, LEVOTHROID) 88 MCG tablet Take 1 tablet (88 mcg total) by mouth daily before breakfast. 11/06/14  Yes Marin Olp, MD  memantine (NAMENDA XR) 28 MG CP24 24 hr capsule Take 1 capsule (28 mg total) by mouth daily. 08/07/14  Yes Adam Telford Nab, DO  mirabegron ER (MYRBETRIQ) 25 MG TB24 tablet Take 25 mg by mouth daily.   Yes Historical Provider, MD  Omega-3 Fatty Acids (FISH OIL) 1000 MG CAPS Take 1,000 mg by mouth daily.   Yes Historical Provider, MD  polyethylene glycol (  MIRALAX / GLYCOLAX) packet Take 17 g by mouth daily.   Yes Historical Provider, MD  rosuvastatin (CRESTOR) 20 MG tablet Take 1 tablet (20 mg total) by mouth once a week. 09/12/14  Yes Marin Olp, MD  sertraline (ZOLOFT) 50 MG tablet Take one tablet (50 mg total) by mouth daily. 08/07/14  Yes Pieter Partridge, DO  traZODone (DESYREL) 50 MG tablet Take 1 tablet (50 mg total) by mouth at bedtime as needed for sleep. 08/05/14  Yes Marin Olp, MD  acetaminophen (TYLENOL) 500 MG tablet Take 500 mg by mouth every 6 (six) hours as needed for headache.    Historical Provider, MD   Review of Systems  Constitutional: Negative for fever.  Skin: Positive for wound. Negative for color change.    Objective:   Physical Exam CONSTITUTIONAL: Well developed/well nourished HEAD: Normocephalic/atraumatic EYES: EOMI/PERRL ENMT: Mucous membranes moist NECK: supple no meningeal signs.  1x1 cm firm irregular subcutaneous mass to right side of neck. This is not a lymph node but is attached to skin SPINE/BACK:entire spine nontender CV: S1/S2 noted, no murmurs/rubs/gallops noted LUNGS: Lungs are clear to auscultation bilaterally, no apparent distress ABDOMEN: soft, nontender, no rebound or guarding, bowel sounds noted throughout abdomen GU:no cva tenderness NEURO: Pt is awake/alert/appropriate, moves all extremitiesx4.  No facial droop.   EXTREMITIES: pulses normal/equal, full ROM SKIN: warm, color normal. .5 x.5 cm bite like area approximately  2in below lateral malleolus of left foot.  PSYCH: no abnormalities of mood noted, alert and oriented to situation  Filed Vitals:   11/10/14 0953  BP: 120/86  Pulse: 71  Temp: 97.5 F (36.4 C)  TempSrc: Oral  Resp: 17  Height: 5' 3.5" (1.613 m)  Weight: 118 lb (53.524 kg)  SpO2: 98%    Assessment & Plan:  Difficult to get a good history. She does say she removed a tick proximally 48 hours ago now has a reddened area around the tick removal site. Not clear what type of tic this was. She was ordered doxycycline 100 mg to take 2 pills one dose. She will clean the area with soap and water followed by Bactroban. She was advised to return to clinic if she became ill anytime in the next 3 weeks.I personally performed the services described in this documentation, which was scribed in my presence. The recorded information has been reviewed and is accurate.  Nena Jordan, MD

## 2014-12-22 ENCOUNTER — Other Ambulatory Visit: Payer: Self-pay

## 2014-12-22 ENCOUNTER — Telehealth: Payer: Self-pay | Admitting: Neurology

## 2014-12-22 MED ORDER — TRAZODONE HCL 50 MG PO TABS: 50.0000 mg | ORAL_TABLET | Freq: Every evening | ORAL | Status: DC | PRN

## 2014-12-22 NOTE — Telephone Encounter (Signed)
Pt's daughter Julia [name] called and would like to speak to you about her mother's appointment tomorrow and her medication Zoloft/Dawn CB# [phone]

## 2014-12-22 NOTE — Telephone Encounter (Signed)
Daughter states patient is all over the place crying  Feeling down having outbreaks with vulgar language  and would like for you to speak to patient about increasing zoloft

## 2014-12-23 ENCOUNTER — Ambulatory Visit: Payer: Self-pay | Admitting: Neurology

## 2014-12-25 ENCOUNTER — Encounter: Payer: Self-pay | Admitting: *Deleted

## 2014-12-25 ENCOUNTER — Telehealth: Payer: Self-pay | Admitting: *Deleted

## 2014-12-25 NOTE — Telephone Encounter (Signed)
No show letter sent for missed appointment for 12/23/14

## 2014-12-29 ENCOUNTER — Telehealth: Payer: Self-pay | Admitting: Neurology

## 2014-12-29 NOTE — Telephone Encounter (Signed)
I  Spoke with this patient and advised her to keep her 02/10/15 appt . She is still concerned about not being able to drive she thinks this is the reason for the headache

## 2014-12-29 NOTE — Telephone Encounter (Signed)
Pt called and is having headaches and some stress, she wanted to get in to see Dr Tomi Likens earlier than her September appointment but I told her he was booked and she asked if she could see someone else/Dawn CB# [phone]

## 2015-01-01 ENCOUNTER — Telehealth: Payer: Self-pay | Admitting: Gastroenterology

## 2015-01-01 NOTE — Telephone Encounter (Signed)
Patient has an appointment in October with Dr Deatra Ina, but agrees to come in for evaluation with a physician assistant before that date. She has noted rectal blood at times. No pain. She is also concerned about the stool urgency she has sometimes. She does not always make it to the bathroom in time. She blames it on her age. Appointment 01/05/15.

## 2015-01-05 ENCOUNTER — Ambulatory Visit (INDEPENDENT_AMBULATORY_CARE_PROVIDER_SITE_OTHER): Payer: Medicare Other | Admitting: Physician Assistant

## 2015-01-05 ENCOUNTER — Encounter: Payer: Self-pay | Admitting: Physician Assistant

## 2015-01-05 VITALS — BP 130/70 | HR 72 | Ht 62.8 in | Wt 115.2 lb

## 2015-01-05 DIAGNOSIS — K625 Hemorrhage of anus and rectum: Secondary | ICD-10-CM | POA: Diagnosis not present

## 2015-01-05 DIAGNOSIS — Z86010 Personal history of colonic polyps: Secondary | ICD-10-CM

## 2015-01-05 DIAGNOSIS — K648 Other hemorrhoids: Secondary | ICD-10-CM | POA: Diagnosis not present

## 2015-01-05 MED ORDER — HYDROCORTISONE ACETATE 25 MG RE SUPP: RECTAL | Status: DC

## 2015-01-05 NOTE — Progress Notes (Signed)
Patient ID: Julia [name], female   DOB: 10/06/1934, 80 y.o.   MRN: [id number]   Subjective:    Patient ID: Julia [name], female    DOB: June 11, 1934, 80 y.o.   MRN: [id number]  HPI Julia [name] is a pleasant 80 year old white female known to Dr. Deatra Ina who comes in today with complaints of pinkish blood noted on the toilet tissue. She has history of hypertension, chronic GERD, hyperlipidemia, Alzheimer's dementia, and has history of adenomatous colon polyps. Last colonoscopy was done in July 2013 she had 2 polyps removed one was a tubular adenoma  , also noted scattered left colon diverticulosis and internal hemorrhoids. She says she has recently been seeing a small amount of pinkish blood on the tissue after bowel movements. She says this is very small amounts. She has not noticed any blood mixed in with her bowel movements or blood in the commode. Her bowel movements have been normal for her though she doesn't have a bowel movement every day she denies any difficulty with constipation or straining. She says occasionally she'll have a little rectal pressure but no pain and denies any abdominal pain.  Review of Systems Pertinent positive and negative review of systems were noted in the above HPI section.  All other review of systems was otherwise negative.  Outpatient Encounter Prescriptions as of 01/05/2015  Medication Sig  . acetaminophen (TYLENOL) 500 MG tablet Take 500 mg by mouth every 6 (six) hours as needed for headache.  . ALPRAZolam (XANAX) 0.25 MG tablet Take 1 tablet (0.25 mg total) by mouth at bedtime as needed for anxiety.  Marland Kitchen amLODipine (NORVASC) 5 MG tablet Take 1 tablet (5 mg total) by mouth daily. Please ignore prior 2.5mg  rx, updated rx should be 5mg  daily.  Marland Kitchen aspirin 81 MG tablet Take 81 mg by mouth at bedtime.   . Bismuth Subsalicylate (KAOPECTATE PO) Take by mouth.  . doxycycline (VIBRA-TABS) 100 MG tablet Take 2 pills one dose  . galantamine (RAZADYNE ER) 16 MG 24 hr capsule  Take 1 capsule (16 mg total) by mouth daily with breakfast.  . Ibuprofen 200 MG CAPS Take 1 capsule (200 mg total) by mouth daily as needed.  Marland Kitchen imipramine (TOFRANIL) 50 MG tablet Take 1 tablets in evening (50mg ) (Patient taking differently: Take 50 mg by mouth every evening. Take 1 tablets in evening (50mg ))  . levothyroxine (SYNTHROID, LEVOTHROID) 88 MCG tablet Take 1 tablet (88 mcg total) by mouth daily before breakfast.  . memantine (NAMENDA XR) 28 MG CP24 24 hr capsule Take 1 capsule (28 mg total) by mouth daily.  . mirabegron ER (MYRBETRIQ) 25 MG TB24 tablet Take 25 mg by mouth daily.  . mupirocin ointment (BACTROBAN) 2 % Apply small amount to the wound on your heel twice a day  . Omega-3 Fatty Acids (FISH OIL) 1000 MG CAPS Take 1,000 mg by mouth daily.  . polyethylene glycol (MIRALAX / GLYCOLAX) packet Take 17 g by mouth daily.  . rosuvastatin (CRESTOR) 20 MG tablet Take 1 tablet (20 mg total) by mouth once a week.  . sertraline (ZOLOFT) 50 MG tablet Take one tablet (50 mg total) by mouth daily.  . traZODone (DESYREL) 50 MG tablet Take 1 tablet (50 mg total) by mouth at bedtime as needed for sleep.  . hydrocortisone (ANUSOL-HC) 25 MG suppository Insert 1 suppository into rectum every night x 7 days then as needed thereafter.   No facility-administered encounter medications on file as of 01/05/2015.   Allergies  Allergen Reactions  .  Entex Other (See Comments)    unknown  . Sulfonamide Derivatives Other (See Comments)    unknown   Patient Active Problem List   Diagnosis Date Noted  . Symptomatic bradycardia   . Sinus bradycardia 09/05/2014  . Junctional escape rhythm 09/05/2014  . Dizziness 09/05/2014  . SOB (shortness of breath) 09/05/2014  . Hypertension 09/05/2014  . Bradycardia 09/05/2014  . Tingling in extremities 07/08/2014  . Alzheimer's dementia 04/21/2014  . Overactive bladder 04/21/2014  . Hypothyroid 11/21/2013  . PALPITATIONS, RECURRENT 12/09/2008  . Hyperlipidemia  04/18/2007  . Essential hypertension 12/11/2006  . GERD 12/11/2006  . Osteopenia 12/11/2006  . History of colonic polyps 12/11/2006   History   Social History  . Marital Status: Married    Spouse Name: N/A  . Number of Children: N/A  . Years of Education: N/A   Occupational History  . retired    Social History Main Topics  . Smoking status: Never Smoker   . Smokeless tobacco: Never Used  . Alcohol Use: No  . Drug Use: No  . Sexual Activity: No   Other Topics Concern  . Not on file   Social History Narrative   Married to husband Ardyth Gal and lives in [place]. 2 daughters live in [place], 1 son in [place] and 1 in [place].       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       Hobbies: tv, caring for cat (very important to her), change is difficult even with type of tv      Jake Samples (daughter) Chauncey Reading [phone]   Full Code    Ms. Ardila's family history includes Alzheimer's disease in her maternal aunt; COPD in her mother; Cancer in her maternal uncle; Drug abuse in her maternal uncle; Heart disease in her brother; Hypertension in her mother; Lung cancer in her father; Stomach cancer in her father. There is no history of Colon cancer, Esophageal cancer, Rectal cancer, Heart attack, or Stroke.      Objective:    Filed Vitals:   01/05/15 1438  BP: 130/70  Pulse: 72    Physical Exam  well-developed elderly white female in no acute distress, pleasant blood pressure 130/70 pulse 72 height 5 foot 2 weight 1:15. HEENT; nontraumatic normocephalic EOMI PERRLA sclera anicteric, Cardiovascular; regular rate and rhythm with S1-S2 no murmur or gallop, Pulmonary; clear bilaterally, Abdomen; flat soft nontender nondistended bowel sounds are active there is no palpable mass or hepatosplenomegaly, Rectal; exam no external hemorrhoidal tags there is decreased anal sphincter tone stool is brown, heme-negative she does have an inflamed internal hemorrhoid.  Extremities; no clubbing cyanosis or edema skin warm and dry, Neuropsych; mood and affect appropriate       Assessment & Plan:   #1 80  yo female with scant hematochezia on tissue very likely secondary to internal hemorrhoids  #2 hx of adenomatous colon polyps- last colon 2013 #3 diverticulosis #4 Alzheimers #5 GERD #6 intermittent fecal incontinence  Plan; Anusol hc supp qhs x 7 days the as needed. Pt advised to return to office for persistent or increasing rectal bleeding  She will be due for follow up colon in 2018   Alfredia Ferguson PA-C 01/05/2015   Cc: Marin Olp, MD

## 2015-01-05 NOTE — Patient Instructions (Signed)
We have sent the following medications to your pharmacy for you to pick up at your convenience: Anusol [facility] suppositories- Insert 1 suppository into rectum every night x 7 nights, then as needed thereafter.  Please follow up with Amy should your bleeding persist or worsen.  You will be due for a recall colonoscopy in 11/2016. We will send you a reminder in the mail when it gets closer to that time.

## 2015-01-06 NOTE — Progress Notes (Signed)
Reviewed and agree with management. Reda Citron D. France Noyce, M.D., FACG  

## 2015-01-08 ENCOUNTER — Telehealth: Payer: Self-pay | Admitting: Neurology

## 2015-01-08 NOTE — Telephone Encounter (Signed)
I spoke lengthy with this patients daughter Marcie Bal regarding  Patient and her driving . I explained to her the driving evaluator has made the decision that her driving is restricted and the patient is aware. She has also gotten and extension on her permit . The daughter states she is not doing what her restrictions say she is driving where and when she wants and is going to contact the [facility] . She stat the patient is also angry and mad at the family. She will be at the next appt with patient .

## 2015-01-08 NOTE — Telephone Encounter (Signed)
Pt's daughter Neale Burly called for info on her mom's present changes/ emotional/mentally/ [phone]

## 2015-01-09 ENCOUNTER — Encounter (HOSPITAL_COMMUNITY): Payer: Self-pay | Admitting: *Deleted

## 2015-01-09 ENCOUNTER — Emergency Department (HOSPITAL_COMMUNITY)
Admission: EM | Admit: 2015-01-09 | Discharge: 2015-01-09 | Disposition: A | Discharge status: Home / Self Care | Payer: Medicare Other | Attending: Emergency Medicine | Admitting: Emergency Medicine

## 2015-01-09 DIAGNOSIS — I10 Essential (primary) hypertension: Secondary | ICD-10-CM | POA: Diagnosis not present

## 2015-01-09 DIAGNOSIS — R531 Weakness: Secondary | ICD-10-CM | POA: Diagnosis not present

## 2015-01-09 DIAGNOSIS — E785 Hyperlipidemia, unspecified: Secondary | ICD-10-CM | POA: Insufficient documentation

## 2015-01-09 DIAGNOSIS — F0390 Unspecified dementia without behavioral disturbance: Secondary | ICD-10-CM | POA: Diagnosis not present

## 2015-01-09 DIAGNOSIS — Z86010 Personal history of colonic polyps: Secondary | ICD-10-CM | POA: Diagnosis not present

## 2015-01-09 DIAGNOSIS — R51 Headache: Secondary | ICD-10-CM | POA: Insufficient documentation

## 2015-01-09 DIAGNOSIS — Z8719 Personal history of other diseases of the digestive system: Secondary | ICD-10-CM | POA: Diagnosis not present

## 2015-01-09 DIAGNOSIS — F419 Anxiety disorder, unspecified: Secondary | ICD-10-CM | POA: Insufficient documentation

## 2015-01-09 DIAGNOSIS — Z8739 Personal history of other diseases of the musculoskeletal system and connective tissue: Secondary | ICD-10-CM | POA: Diagnosis not present

## 2015-01-09 DIAGNOSIS — Z872 Personal history of diseases of the skin and subcutaneous tissue: Secondary | ICD-10-CM | POA: Diagnosis not present

## 2015-01-09 DIAGNOSIS — E079 Disorder of thyroid, unspecified: Secondary | ICD-10-CM | POA: Insufficient documentation

## 2015-01-09 DIAGNOSIS — Z8781 Personal history of (healed) traumatic fracture: Secondary | ICD-10-CM | POA: Insufficient documentation

## 2015-01-09 DIAGNOSIS — G4700 Insomnia, unspecified: Secondary | ICD-10-CM | POA: Insufficient documentation

## 2015-01-09 DIAGNOSIS — R519 Headache, unspecified: Secondary | ICD-10-CM

## 2015-01-09 LAB — CBC
HCT: 44.9 % (ref 36.0–46.0)
Hemoglobin: 14.6 g/dL (ref 12.0–15.0)
MCH: 29.3 pg (ref 26.0–34.0)
MCHC: 32.5 g/dL (ref 30.0–36.0)
MCV: 90.2 fL (ref 78.0–100.0)
PLATELETS: 223 10*3/uL (ref 150–400)
RBC: 4.98 MIL/uL (ref 3.87–5.11)
RDW: 14.1 % (ref 11.5–15.5)
WBC: 5.6 10*3/uL (ref 4.0–10.5)

## 2015-01-09 LAB — BASIC METABOLIC PANEL
Anion gap: 9 (ref 5–15)
BUN: 22 mg/dL — ABNORMAL HIGH (ref 6–20)
CALCIUM: 9.6 mg/dL (ref 8.9–10.3)
CO2: 26 mmol/L (ref 22–32)
Chloride: 106 mmol/L (ref 101–111)
Creatinine, Ser: 0.95 mg/dL (ref 0.44–1.00)
GFR, EST NON AFRICAN AMERICAN: 56 mL/min — AB (ref >60)
GLUCOSE: 117 mg/dL — AB (ref 65–99)
Potassium: 4 mmol/L (ref 3.5–5.1)
Sodium: 141 mmol/L (ref 135–145)

## 2015-01-09 LAB — I-STAT TROPONIN, ED: TROPONIN I, POC: 0 ng/mL (ref 0.00–0.08)

## 2015-01-09 MED ORDER — METOCLOPRAMIDE HCL 5 MG/ML IJ SOLN
10.0000 mg | Freq: Once | INTRAMUSCULAR | Status: AC
  Administered 2015-01-09: 10 mg via INTRAVENOUS
  Filled 2015-01-09: qty 2

## 2015-01-09 MED ORDER — SODIUM CHLORIDE 0.9 % IV BOLUS (SEPSIS)
1000.0000 mL | Freq: Once | INTRAVENOUS | Status: AC
  Administered 2015-01-09: 1000 mL via INTRAVENOUS

## 2015-01-09 MED ORDER — DIPHENHYDRAMINE HCL 50 MG/ML IJ SOLN
12.5000 mg | Freq: Once | INTRAMUSCULAR | Status: AC
  Administered 2015-01-09: 12.5 mg via INTRAVENOUS
  Filled 2015-01-09: qty 1

## 2015-01-09 MED ORDER — LORAZEPAM 2 MG/ML IJ SOLN
0.5000 mg | Freq: Once | INTRAMUSCULAR | Status: AC
  Administered 2015-01-09: 0.5 mg via INTRAVENOUS
  Filled 2015-01-09: qty 1

## 2015-01-09 NOTE — ED Notes (Addendum)
Pt states she feels weak, shaky and "just doesn't feel right". Pt states she was unable to sleep last night. Pt states she has felt stressed out and has been unable to sleep since her doctor told her she could not drive. Pt has hx of dementia.

## 2015-01-09 NOTE — ED Provider Notes (Signed)
CSN: [id number]     Arrival date & time 01/09/15  2022 History   First MD Initiated Contact with Patient 01/09/15 2142     Chief Complaint  Patient presents with  . Weakness  . Anxiety  . Insomnia  . Headache     (Consider location/radiation/quality/duration/timing/severity/associated sxs/prior Treatment) HPI Comments: 80 year old female here with multiple complaints. She feels very anxious and was unable to sleep last night. She has history of difficulty sleeping. She's been very stressed out lately because she is been told she has early dementia and her doctor is taking away her driving privileges. Here she is alert and oriented and is doing very well. She states she had a headache today that was gradual in onset. She has chronic headaches for the past 30 years. She states this one feels, "light". She denies any fevers, shortness of breath, chest pain, vomiting. She does say that, "something doesn't feel right about myself."  Patient is a 80 y.o. female presenting with weakness, anxiety, insomnia, and headaches. The history is provided by the patient.  Weakness This is a new problem. The current episode started more than 1 week ago. The problem occurs constantly. The problem has not changed since onset.Associated symptoms include headaches. Pertinent negatives include no shortness of breath. Nothing aggravates the symptoms. Nothing relieves the symptoms.  Anxiety Associated symptoms include headaches. Pertinent negatives include no shortness of breath.  Insomnia Associated symptoms include headaches. Pertinent negatives include no shortness of breath.  Headache Associated symptoms: weakness   Associated symptoms: no cough, no fever and no vomiting     Past Medical History  Diagnosis Date  . GERD (gastroesophageal reflux disease)   . Hypertension   . Hyperlipidemia   . Osteopenia   . History of colonic polyps   . Right forearm fracture 2011  . Anxiety   . Thyroid disease   .  SEBACEOUS CYST, INFECTED 03/16/2009    Qualifier: Diagnosis of  By: Arnoldo Morale MD, Hazen with no loss of consciousness 05/14/2008    Qualifier: Diagnosis of  By: Arnoldo Morale MD, Balinda Quails   . Dementia   . Hx of echocardiogram     Echo 4/16:  EF 55-60%, mild LVH   Past Surgical History  Procedure Laterality Date  . Rotator cuff repair      right shoulder  . Bladder tack    . Tonsillectomy    . Bilateral salpingoophorectomy    . Surgical repair to fx left wrist    . Abdominal hysterectomy  2009   Family History  Problem Relation Age of Onset  . Lung cancer Father   . Hypertension Mother   . COPD Mother   . Stomach cancer Father   . Heart disease Brother   . Alzheimer's disease Maternal Aunt   . Drug abuse Maternal Uncle   . Cancer Maternal Uncle   . Colon cancer Neg Hx   . Esophageal cancer Neg Hx   . Rectal cancer Neg Hx   . Heart attack Neg Hx   . Stroke Neg Hx    Social History  Substance Use Topics  . Smoking status: Never Smoker   . Smokeless tobacco: Never Used  . Alcohol Use: No   OB History    No data available     Review of Systems  Constitutional: Negative for fever.  Respiratory: Negative for cough and shortness of breath.   Gastrointestinal: Negative for vomiting.  Neurological: Positive for weakness and headaches.  Psychiatric/Behavioral:  The patient has insomnia.   All other systems reviewed and are negative.     Allergies  Entex and Sulfonamide derivatives  Home Medications   Prior to Admission medications   Medication Sig Start Date End Date Taking? Authorizing Provider  acetaminophen (TYLENOL) 500 MG tablet Take 500 mg by mouth every 6 (six) hours as needed for headache.    Historical Provider, MD  ALPRAZolam Duanne Moron) 0.25 MG tablet Take 1 tablet (0.25 mg total) by mouth at bedtime as needed for anxiety. 02/25/14   Marin Olp, MD  amLODipine (NORVASC) 5 MG tablet Take 1 tablet (5 mg total) by mouth daily. Please ignore prior  2.5mg  rx, updated rx should be 5mg  daily. 09/12/14   Marin Olp, MD  aspirin 81 MG tablet Take 81 mg by mouth at bedtime.     Historical Provider, MD  Bismuth Subsalicylate (KAOPECTATE PO) Take by mouth.    Historical Provider, MD  doxycycline (VIBRA-TABS) 100 MG tablet Take 2 pills one dose 11/10/14   Darlyne Russian, MD  galantamine (RAZADYNE ER) 16 MG 24 hr capsule Take 1 capsule (16 mg total) by mouth daily with breakfast. 11/06/14   Pieter Partridge, DO  hydrocortisone (ANUSOL-HC) 25 MG suppository Insert 1 suppository into rectum every night x 7 days then as needed thereafter. 01/05/15   Amy S Esterwood, PA-C  Ibuprofen 200 MG CAPS Take 1 capsule (200 mg total) by mouth daily as needed. 09/06/14   Liliane Shi, PA-C  imipramine (TOFRANIL) 50 MG tablet Take 1 tablets in evening (50mg ) Patient taking differently: Take 50 mg by mouth every evening. Take 1 tablets in evening (50mg ) 08/05/14   Marin Olp, MD  levothyroxine (SYNTHROID, LEVOTHROID) 88 MCG tablet Take 1 tablet (88 mcg total) by mouth daily before breakfast. 11/06/14   Marin Olp, MD  memantine (NAMENDA XR) 28 MG CP24 24 hr capsule Take 1 capsule (28 mg total) by mouth daily. 08/07/14   Pieter Partridge, DO  mirabegron ER (MYRBETRIQ) 25 MG TB24 tablet Take 25 mg by mouth daily.    Historical Provider, MD  mupirocin ointment (BACTROBAN) 2 % Apply small amount to the wound on your heel twice a day 11/10/14   Darlyne Russian, MD  Omega-3 Fatty Acids (FISH OIL) 1000 MG CAPS Take 1,000 mg by mouth daily.    Historical Provider, MD  polyethylene glycol (MIRALAX / GLYCOLAX) packet Take 17 g by mouth daily.    Historical Provider, MD  rosuvastatin (CRESTOR) 20 MG tablet Take 1 tablet (20 mg total) by mouth once a week. 09/12/14   Marin Olp, MD  sertraline (ZOLOFT) 50 MG tablet Take one tablet (50 mg total) by mouth daily. 08/07/14   Pieter Partridge, DO  traZODone (DESYREL) 50 MG tablet Take 1 tablet (50 mg total) by mouth at bedtime as needed for  sleep. 12/22/14   Marin Olp, MD   BP 178/97 mmHg  Pulse 97  Temp(Src) 97.4 F (36.3 C) (Oral)  Resp 18  SpO2 97% Physical Exam  Constitutional: She is oriented to person, place, and time. She appears well-developed and well-nourished. No distress.  HENT:  Head: Normocephalic and atraumatic.  Mouth/Throat: Oropharynx is clear and moist.  Eyes: EOM are normal. Pupils are equal, round, and reactive to light.  Neck: Normal range of motion. Neck supple.  Cardiovascular: Normal rate and regular rhythm.  Exam reveals no friction rub.   No murmur heard. Pulmonary/Chest: Effort normal and breath  sounds normal. No respiratory distress. She has no wheezes. She has no rales.  Abdominal: Soft. She exhibits no distension. There is no tenderness. There is no rebound.  Musculoskeletal: Normal range of motion. She exhibits no edema.  Neurological: She is alert and oriented to person, place, and time.  Skin: Skin is warm. She is not diaphoretic.  Nursing note and vitals reviewed.   ED Course  Procedures (including critical care time) Labs Review Labs Reviewed  BASIC METABOLIC PANEL - Abnormal; Notable for the following:    Glucose, Bld 117 (*)    BUN 22 (*)    GFR calc non Af Amer 56 (*)    All other components within normal limits  CBC  I-STAT TROPOININ, ED    Imaging Review No results found. Danella Sensing, personally reviewed and evaluated these images and lab results as part of my medical decision-making.   EKG Interpretation None      MDM   Final diagnoses:  Acute nonintractable headache, unspecified headache type  Anxiety    80 year old female here with headache, anxiety. She didn't sleep last night. She is extremely she's here. 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 With slow onset and no neurologic signs, I do not feel she needs a head CT.  She's doing well now, denies any belly pain or chest pain. Will check some basic labs since she said something does not feel right. Will give small dose of Ativan for anxiety. Will give Reglan and Benadryl for her headache.  Feeling better. Stable for discharge.  Evelina Bucy, MD 01/09/15 [phone]

## 2015-01-09 NOTE — Discharge Instructions (Signed)

## 2015-01-19 ENCOUNTER — Telehealth: Payer: Self-pay | Admitting: Family Medicine

## 2015-01-19 NOTE — Telephone Encounter (Signed)
error 

## 2015-01-26 ENCOUNTER — Telehealth: Payer: Self-pay | Admitting: *Deleted

## 2015-01-26 NOTE — Telephone Encounter (Signed)
I requested Daughter Kenney Houseman bring the letter from [facility] by office to have a copy made so that Dr Tomi Likens will know what they are requesting  To be written

## 2015-01-26 NOTE — Telephone Encounter (Signed)
Patients daughter call to speak to nurse about her mothers medical review for her drivers licence Call back number [phone])

## 2015-01-29 ENCOUNTER — Encounter: Payer: Self-pay | Admitting: Neurology

## 2015-02-03 ENCOUNTER — Other Ambulatory Visit: Payer: Self-pay | Admitting: Neurology

## 2015-02-04 ENCOUNTER — Telehealth: Payer: Self-pay | Admitting: *Deleted

## 2015-02-04 ENCOUNTER — Other Ambulatory Visit: Payer: Self-pay | Admitting: *Deleted

## 2015-02-04 MED ORDER — GALANTAMINE HYDROBROMIDE ER 16 MG PO CP24: 16.0000 mg | ORAL_CAPSULE | Freq: Every day | ORAL | Status: DC

## 2015-02-04 NOTE — Telephone Encounter (Signed)
Patients daughter Kenney Houseman called she would like to speak to you about her upcomming appointment  call back number [phone]

## 2015-02-04 NOTE — Telephone Encounter (Signed)
Rx sent 

## 2015-02-04 NOTE — Telephone Encounter (Signed)
Spoke with Kenney Houseman (Patients Daughter). She stated that Jaedan has requested that she come to her visit alone without both of her daughters present. Kenney Houseman said that the daughters will both be here but, they will allow Dotty to visit with you by herself. Kenney Houseman did however ask that after the visit is finished that they come in and speak with you. She also had some concerns about Brittainy that she wanted to let you know of beforehand. She states that the she doesn't think the dosage of the anxiety med is correct, states that mother is very anxious. She also stated that she has been upset about them taking away her license that she has started walking 2-3 times per day and she has lost 12 pounds in the last 2 weeks and they are concerned about the weight loss.

## 2015-02-10 ENCOUNTER — Encounter: Payer: Self-pay | Admitting: Neurology

## 2015-02-10 ENCOUNTER — Ambulatory Visit (INDEPENDENT_AMBULATORY_CARE_PROVIDER_SITE_OTHER): Payer: Medicare Other | Admitting: Neurology

## 2015-02-10 VITALS — BP 142/70 | HR 90 | Temp 97.7°F | Resp 17 | Ht 62.8 in | Wt 112.8 lb

## 2015-02-10 DIAGNOSIS — F411 Generalized anxiety disorder: Secondary | ICD-10-CM | POA: Diagnosis not present

## 2015-02-10 DIAGNOSIS — F329 Major depressive disorder, single episode, unspecified: Secondary | ICD-10-CM | POA: Diagnosis not present

## 2015-02-10 DIAGNOSIS — G309 Alzheimer's disease, unspecified: Secondary | ICD-10-CM

## 2015-02-10 DIAGNOSIS — F32A Depression, unspecified: Secondary | ICD-10-CM

## 2015-02-10 DIAGNOSIS — F0280 Dementia in other diseases classified elsewhere without behavioral disturbance: Secondary | ICD-10-CM

## 2015-02-10 MED ORDER — SERTRALINE HCL 25 MG PO TABS: 75.0000 mg | ORAL_TABLET | Freq: Every day | ORAL | Status: DC

## 2015-02-10 NOTE — Progress Notes (Addendum)
NEUROLOGY FOLLOW UP OFFICE NOTE  KAMILLA HANDS [phone]  HISTORY OF PRESENT ILLNESS: Jeraldin Fesler is a 80 year old woman with HTN, hyperlipidemia, anxiety and thyroid disease, who returns for Alzheimer's dementia.  Records reviewed.  She is accompanied by her daughters who provide some history.    UPDATE: She currently takes galantamine ER 16mg  daily and memantine XR 28mg  daily.  She also takes Zoloft 50mg  daily.  One of her 55 reportedly wrote a letter to the [facility] that she is unable to drive.  This caused a great deal of anxiety.  We sent her for formal driving evaluation by occupational therapy on 12/16/14.  Driving was allowed with some restrictions were recommended.  Recommendations include no night driving, familiar routes only, only during non-rush hour, 10 mile radius from home, no interstate driving, speeds not exceeding 45 mph.  Despite still being able to drive, albeit with restrictions, she continues to have anxiety, thinking that her driving privileges are trying to be taken away.  She is still waiting to hear from the [facility] regarding the final decision.  She went to the ED last month for these symptoms, including weakness and headache.  She continues to live independently with her husband.  She tends to the her husband and the house work.  The house is kept clean.  Somebody comes to clean the house once a month.  Her daughter sets up her pillbox for the week.  Her daughter also helps with some of the finances.  The main issue is the anxiety.  She has lost about 10 lbs since February.  She only eats two meals a day and not enough protein.  She needs to be reminded to drink water during the day.  She walks twice a day outside.  HISTORY: Onset of symptoms began 4 years ago, starting with misplacing things such as her keys, and would often repeat questions. She is particularly having short-term memory problems. She frequently repeats questions and forgets why she walked  into a room.  She does not forget faces.  She still pays the bills, but with the guidance of her daughter, because she sometimes forgets about some bills.  She has a long-standing history of anxiety, which got worse after the passing of her brother and later one of her daughters.  She occasionally is confused but doesn't really become disoriented when driving. She rarely drives and only during the day to the hair salon or occasionally [facility]. She is able to perform all her ADLs, such as bathing, dressing and feeding herself.  Her husband is disabled and she takes care of most of the house chores.  She does the laundry, washes the dishes and cleans the house.  She has been more unsteady and weak in her legs.  She had a couple of falls while carrying heavy loads of trash outside on uneven ground.  She has an exercise bike which she does not use.  She says she sleeps well.  MRI of the brain from 12/04/12 was reviewed and revealed mild to moderate atrophy with moderately extensive small vessel ischemic changes and small chronic lacunar infarct.  B12 level was 301.  She underwent neuropsychological testing in August-September 2014, which did reveal mild cognitive impairment, somewhat complicated by periods of depression and anxiety.  PAST MEDICAL HISTORY: Past Medical History  Diagnosis Date  . GERD (gastroesophageal reflux disease)   . Hypertension   . Hyperlipidemia   . Osteopenia   . History of colonic polyps   .  Right forearm fracture 2011  . Anxiety   . Thyroid disease   . SEBACEOUS CYST, INFECTED 03/16/2009    Qualifier: Diagnosis of  By: Arnoldo Morale MD, [place] with no loss of consciousness 05/14/2008    Qualifier: Diagnosis of  By: Arnoldo Morale MD, Balinda Quails   . Dementia   . Hx of echocardiogram     Echo 4/16:  EF 55-60%, mild LVH    MEDICATIONS: Current Outpatient Prescriptions on File Prior to Visit  Medication Sig Dispense Refill  . amLODipine (NORVASC) 5 MG tablet Take 1 tablet  (5 mg total) by mouth daily. Please ignore prior 2.5mg  rx, updated rx should be 5mg  daily. 30 tablet 5  . aspirin 81 MG tablet Take 81 mg by mouth at bedtime.     . Bismuth Subsalicylate (KAOPECTATE PO) Take by mouth.    . galantamine (RAZADYNE ER) 16 MG 24 hr capsule Take 1 capsule (16 mg total) by mouth daily with breakfast. 30 capsule 2  . Ibuprofen 200 MG CAPS Take 1 capsule (200 mg total) by mouth daily as needed. (Patient taking differently: Take 200 mg by mouth daily as needed. For pain) 120 each 0  . imipramine (TOFRANIL) 50 MG tablet Take 1 tablets in evening (50mg ) (Patient taking differently: Take 50 mg by mouth every evening. Take 1 tablets in evening (50mg )) 90 tablet 3  . levothyroxine (SYNTHROID, LEVOTHROID) 88 MCG tablet Take 1 tablet (88 mcg total) by mouth daily before breakfast. 90 tablet 1  . memantine (NAMENDA XR) 28 MG CP24 24 hr capsule Take 1 capsule (28 mg total) by mouth daily. 30 capsule 6  . mirabegron ER (MYRBETRIQ) 25 MG TB24 tablet Take 25 mg by mouth daily.    . Omega-3 Fatty Acids (FISH OIL) 1000 MG CAPS Take 1,000 mg by mouth daily.    . polyethylene glycol (MIRALAX / GLYCOLAX) packet Take 17 g by mouth daily.    . rosuvastatin (CRESTOR) 20 MG tablet Take 1 tablet (20 mg total) by mouth once a week. 15 tablet 3  . traZODone (DESYREL) 50 MG tablet Take 1 tablet (50 mg total) by mouth at bedtime as needed for sleep. 30 tablet 3  . ALPRAZolam (XANAX) 0.25 MG tablet Take 1 tablet (0.25 mg total) by mouth at bedtime as needed for anxiety. (Patient not taking: Reported on 01/09/2015) 60 tablet 0   No current facility-administered medications on file prior to visit.    ALLERGIES: Allergies  Allergen Reactions  . Entex Other (See Comments)    unknown  . Sulfonamide Derivatives Other (See Comments)    unknown    FAMILY HISTORY: Family History  Problem Relation Age of Onset  . Lung cancer Father   . Hypertension Mother   . COPD Mother   . Stomach cancer Father    . Heart disease Brother   . Alzheimer's disease Maternal Aunt   . Drug abuse Maternal Uncle   . Cancer Maternal Uncle   . Colon cancer Neg Hx   . Esophageal cancer Neg Hx   . Rectal cancer Neg Hx   . Heart attack Neg Hx   . Stroke Neg Hx     SOCIAL HISTORY: Social History   Social History  . Marital Status: Married    Spouse Name: N/A  . Number of Children: N/A  . Years of Education: N/A   Occupational History  . retired    Social History Main Topics  . Smoking status: Never Smoker   .  Smokeless tobacco: Never Used  . Alcohol Use: No  . Drug Use: No  . Sexual Activity: No   Other Topics Concern  . Not on file   Social History Narrative   Married to husband Ardyth Gal and lives in [place]. 2 daughters live in [place], 1 son in [place] and 1 in [place].       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       Hobbies: tv, caring for cat (very important to her), change is difficult even with type of tv      Jake Samples (daughter) Chauncey Reading [phone]   Full Code    REVIEW OF SYSTEMS: Constitutional: No fevers, chills, or sweats, no generalized fatigue, change in appetite Eyes: No visual changes, double vision, eye pain Ear, nose and throat: No hearing loss, ear pain, nasal congestion, sore throat Cardiovascular: No chest pain, palpitations Respiratory:  No shortness of breath at rest or with exertion, wheezes GastrointestinaI: No nausea, vomiting, diarrhea, abdominal pain, fecal incontinence Genitourinary:  No dysuria, urinary retention or frequency Musculoskeletal:  No neck pain, back pain Integumentary: No rash, pruritus, skin lesions Neurological: as above Psychiatric: anxiety Endocrine: No palpitations, fatigue, diaphoresis, mood swings, change in appetite, change in weight, increased thirst Hematologic/Lymphatic:  No anemia, purpura, petechiae. Allergic/Immunologic: no itchy/runny eyes, nasal congestion, recent allergic reactions,  rashes  PHYSICAL EXAM: Filed Vitals:   02/10/15 1109  BP: 142/70  Pulse: 90  Temp: 97.7 F (36.5 C)  Resp: 17   General: No acute distress.  Patient appears well-groomed.  anxious Head:  Normocephalic/atraumatic Eyes:  Fundoscopic exam unremarkable without vessel changes, exudates, hemorrhages or papilledema. Neck: supple, no paraspinal tenderness, full range of motion Heart:  Regular rate and rhythm Lungs:  Clear to auscultation bilaterally Back: No paraspinal tenderness Neurological Exam: alert and oriented to person, place, and time. Attention span and concentration intact, recent and remote memory intact, fund of knowledge intact.  Speech fluent and not dysarthric, language intact.   Montreal Cognitive Assessment  02/10/2015 03/07/2014  Visuospatial/ Executive (0/5) 4 1  Naming (0/3) 1 3  Attention: Read list of digits (0/2) 2 2  Attention: Read list of letters (0/1) 1 0  Attention: Serial 7 subtraction starting at 100 (0/3) 0 0  Language: Repeat phrase (0/2) 1 1  Language : Fluency (0/1) 1 0  Abstraction (0/2) 1 1  Delayed Recall (0/5) 0 0  Orientation (0/6) 5 5  Total 16 13  Adjusted Score (based on education) 17 14   CN II-XII intact. Fundoscopic exam unremarkable without vessel changes, exudates, hemorrhages or papilledema.  Bulk and tone normal, muscle strength 5/5 throughout.  Sensation to light touch, temperature and vibration intact.  Deep tendon reflexes 2+ throughout.  Finger to nose testing intact.  Gait normal.  IMPRESSION: Alzheimer's disease Anxiety and depression  PLAN: 1.  Will increase Zoloft to 75mg  daily.  Family is to call in 4 weeks with update 2.  Continue galantamine ER 16mg  and memantine XR 28mg  daily 3.  Increase water and protein in diet.  Discuss weight loss with new PCP. 4.  Caution about walking outside if it is too hot 5.  Follow up in 6 months.  30 minutes spent face to face with patient, over 50% spent discussing management.  Metta Clines, DO

## 2015-02-10 NOTE — Patient Instructions (Signed)
Increase sertraline to 75mg  daily (prescribed to take three 25mg  tablets).  Call in 4 weeks with update Continue galantamine and Namenda Increase water and protein in diet Do not walk long in the heat Let me know about the response from the DMV Follow up in 6 months.

## 2015-02-12 ENCOUNTER — Encounter: Payer: Self-pay | Admitting: Adult Health

## 2015-02-12 ENCOUNTER — Ambulatory Visit (INDEPENDENT_AMBULATORY_CARE_PROVIDER_SITE_OTHER): Payer: Medicare Other | Admitting: Adult Health

## 2015-02-12 VITALS — BP 130/80 | HR 72 | Temp 98.2°F | Wt 113.0 lb

## 2015-02-12 DIAGNOSIS — R634 Abnormal weight loss: Secondary | ICD-10-CM | POA: Diagnosis not present

## 2015-02-12 DIAGNOSIS — Z23 Encounter for immunization: Secondary | ICD-10-CM

## 2015-02-12 DIAGNOSIS — Z7189 Other specified counseling: Secondary | ICD-10-CM | POA: Diagnosis not present

## 2015-02-12 DIAGNOSIS — G309 Alzheimer's disease, unspecified: Secondary | ICD-10-CM

## 2015-02-12 DIAGNOSIS — Z7689 Persons encountering health services in other specified circumstances: Secondary | ICD-10-CM

## 2015-02-12 DIAGNOSIS — F0280 Dementia in other diseases classified elsewhere without behavioral disturbance: Secondary | ICD-10-CM

## 2015-02-12 NOTE — Progress Notes (Signed)
HPI:  Julia [name] is here to establish care. She is a very pleasant and slightly confused caucasian female who  has a past medical history of GERD (gastroesophageal reflux disease); Hypertension; Hyperlipidemia; Osteopenia; History of colonic polyps; Right forearm fracture (2011); Anxiety; Thyroid disease; Concussion with no loss of consciousness (05/14/2008); Dementia; and echocardiogram. She was previously with Dr. Yong Channel and then switched to me. She sees Dr. Tomi Likens for her dementia.   Last PCP and physical:06/2014 Immunizations:UTD Diet:Healthy eating, eats a lot of fruits and vegetables and not a lot of protein.  Exercise:Walks daily  Colonoscopy:2013 SAYT:0160 Mammogram:Has been "acouple of years"   Has the following chronic problems that require follow up and concerns today:  Alzheimer's Dementia.   - She last saw Dr. Tomi Likens on 02/10/2015. At that time her Zoloft was increased to 75 mg and alantamine ER 16mg  daily and memantine XR 28mg  daily was continued.  She is under medical review at the [facility]. She is driving currently. The recommendations are within 10 minutes and no night driving.   Weight loss She has lost about 10 pounds in the last 6 months. She has gone up on her activity and is walking 2-3 times a day. She continues to eat as she normally has which is a diet high in fruits and vegetables with little protein.    ROS negative for unless reported above: fevers, chills,feeling poorly, hearing or vision loss, chest pain, palpitations, leg claudication, struggling to breath,Not feeling congested in the chest, no orthopenia, no cough,no wheezing, normal appetite, no soft tissue swelling, no hemoptysis, melena, hematochezia, hematuria, falls, loc, si, or thoughts of self harm.    Past Medical History  Diagnosis Date  . GERD (gastroesophageal reflux disease)   . Hypertension   . Hyperlipidemia   . Osteopenia   . History of colonic polyps   . Right forearm fracture  2011  . Anxiety   . Thyroid disease   . SEBACEOUS CYST, INFECTED 03/16/2009    Qualifier: Diagnosis of  By: Arnoldo Morale MD, [place] with no loss of consciousness 05/14/2008    Qualifier: Diagnosis of  By: Arnoldo Morale MD, Balinda Quails   . Dementia   . Hx of echocardiogram     Echo 4/16:  EF 55-60%, mild LVH    Past Surgical History  Procedure Laterality Date  . Rotator cuff repair      right shoulder  . Bladder tack    . Tonsillectomy    . Bilateral salpingoophorectomy    . Surgical repair to fx left wrist    . Abdominal hysterectomy  2009    Family History  Problem Relation Age of Onset  . Lung cancer Father   . Hypertension Mother   . COPD Mother   . Stomach cancer Father   . Heart disease Brother   . Alzheimer's disease Maternal Aunt   . Drug abuse Maternal Uncle   . Cancer Maternal Uncle   . Colon cancer Neg Hx   . Esophageal cancer Neg Hx   . Rectal cancer Neg Hx   . Heart attack Neg Hx   . Stroke Neg Hx     Social History   Social History  . Marital Status: Married    Spouse Name: N/A  . Number of Children: N/A  . Years of Education: N/A   Occupational History  . retired    Social History Main Topics  . Smoking status: Never Smoker   . Smokeless tobacco: Never  Used  . Alcohol Use: No  . Drug Use: No  . Sexual Activity: No   Other Topics Concern  . Not on file   Social History Narrative   Married to husband Ardyth Gal and lives in [place]. 2 daughters live in [place], 1 son in [place] and 1 in [place].       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       Hobbies: tv, caring for cat (very important to her), change is difficult even with type of tv      Jake Samples (daughter) Chauncey Reading [phone]   Full Code     Current outpatient prescriptions:  .  sertraline (ZOLOFT) 50 MG tablet, 50 mg. Take one and half tab daily, Disp: , Rfl:  .  ALPRAZolam (XANAX) 0.25 MG tablet, Take 1 tablet (0.25 mg total) by mouth at bedtime as  needed for anxiety. (Patient not taking: Reported on 01/09/2015), Disp: 60 tablet, Rfl: 0 .  amLODipine (NORVASC) 5 MG tablet, Take 1 tablet (5 mg total) by mouth daily. Please ignore prior 2.5mg  rx, updated rx should be 5mg  daily., Disp: 30 tablet, Rfl: 5 .  aspirin 81 MG tablet, Take 81 mg by mouth at bedtime. , Disp: , Rfl:  .  Bismuth Subsalicylate (KAOPECTATE PO), Take by mouth., Disp: , Rfl:  .  galantamine (RAZADYNE ER) 16 MG 24 hr capsule, Take 1 capsule (16 mg total) by mouth daily with breakfast., Disp: 30 capsule, Rfl: 2 .  Ibuprofen 200 MG CAPS, Take 1 capsule (200 mg total) by mouth daily as needed. (Patient taking differently: Take 200 mg by mouth daily as needed. For pain), Disp: 120 each, Rfl: 0 .  imipramine (TOFRANIL) 50 MG tablet, Take 1 tablets in evening (50mg ) (Patient taking differently: Take 50 mg by mouth every evening. Take 1 tablets in evening (50mg )), Disp: 90 tablet, Rfl: 3 .  levothyroxine (SYNTHROID, LEVOTHROID) 88 MCG tablet, Take 1 tablet (88 mcg total) by mouth daily before breakfast., Disp: 90 tablet, Rfl: 1 .  memantine (NAMENDA XR) 28 MG CP24 24 hr capsule, Take 1 capsule (28 mg total) by mouth daily., Disp: 30 capsule, Rfl: 6 .  mirabegron ER (MYRBETRIQ) 25 MG TB24 tablet, Take 25 mg by mouth daily., Disp: , Rfl:  .  Multiple Vitamin (MULTIVITAMIN) tablet, Take 1 tablet by mouth daily., Disp: , Rfl:  .  Omega-3 Fatty Acids (FISH OIL) 1000 MG CAPS, Take 1,000 mg by mouth daily., Disp: , Rfl:  .  polyethylene glycol (MIRALAX / GLYCOLAX) packet, Take 17 g by mouth daily., Disp: , Rfl:  .  rosuvastatin (CRESTOR) 20 MG tablet, Take 1 tablet (20 mg total) by mouth once a week., Disp: 15 tablet, Rfl: 3 .  traZODone (DESYREL) 50 MG tablet, Take 1 tablet (50 mg total) by mouth at bedtime as needed for sleep., Disp: 30 tablet, Rfl: 3  EXAM:  Filed Vitals:   02/12/15 1038  BP: 130/80  Pulse: 72  Temp: 98.2 F (36.8 C)    Body mass index is 20.15  kg/(m^2).  GENERAL: vitals reviewed and listed above, alert, oriented, appears well hydrated and in no acute distress  HEENT: atraumatic, conjunttiva clear, no obvious abnormalities on inspection of external nose and ears  NECK: Neck is soft and supple without masses, no adenopathy or thyromegaly, trachea midline, no JVD. Normal range of motion.   LUNGS: clear to auscultation bilaterally, no wheezes, rales or rhonchi, good air movement  CV: Regular rate and rhythm,  normal S1/S2, no audible murmurs, gallops, or rubs. No carotid bruit and no peripheral edema.   MS: moves all extremities without noticeable abnormality. No edema noted  Abd: soft/nontender/nondistended/normal bowel sounds   Skin: warm and dry, no rash   Extremities: No clubbing, cyanosis, or edema. Capillary refill is WNL. Pulses intact bilaterally in upper and lower extremities.   Neuro: CN II-XII intact, sensation and reflexes normal throughout, 5/5 muscle strength in bilateral upper and lower extremities. Normal finger to nose. Normal rapid alternating movements. Normal romberg. No pronator drift. Slight confusion, she has been noticed to repeat the same story multiple times in the course of the visit.  Is able to answer questions appropriately.   PSYCH: pleasant and cooperative, no obvious depression or anxiety  ASSESSMENT AND PLAN: 1. Encounter to establish care - Follow up in February for CEP - Follow up sooner if neded  2. Needs flu shot - Flu Vaccine QUAD 36+ mos PF IM (Fluarix & Fluzone Quad PF)  3. Alzheimer's dementia - Follow up with Dr. Tomi Likens every 6 months  4. Loss of weight - Continue to eat healthy and exercise.  - Add protein to diet - Two Boost shakes per day    Discussed the following assessment and plan:  Needs flu shot - Plan: Flu Vaccine QUAD 36+ mos PF IM (Fluarix & Fluzone Quad PF) -We reviewed the PMH, PSH, FH, SH, Meds and Allergies. -We provided refills for any medications we will  prescribe as needed. -We addressed current concerns per orders and patient instructions. -We have asked for records for pertinent exams, studies, vaccines and notes from previous providers. -We have advised patient to follow up per instructions below.   -Patient advised to return or notify a provider immediately if symptoms worsen or persist or new concerns arise.  There are no Patient Instructions on file for this visit.   [employer]

## 2015-02-12 NOTE — Patient Instructions (Addendum)
It was great meeting you today and I am so happy to have you on the team!  Please follow up with me in February for your next physical or sooner if needed.   Continue to eat healthy and increased your intake of food. Drink two Boost shakes per day.   If you need anything, please let me know

## 2015-02-19 ENCOUNTER — Ambulatory Visit: Payer: Self-pay | Admitting: Neurology

## 2015-03-09 ENCOUNTER — Other Ambulatory Visit: Payer: Self-pay | Admitting: Neurology

## 2015-03-09 NOTE — Telephone Encounter (Signed)
Rx sent 

## 2015-03-25 ENCOUNTER — Other Ambulatory Visit: Payer: Self-pay | Admitting: Family Medicine

## 2015-04-06 ENCOUNTER — Encounter: Payer: Self-pay | Admitting: Gastroenterology

## 2015-04-28 ENCOUNTER — Other Ambulatory Visit: Payer: Self-pay | Admitting: Family Medicine

## 2015-04-30 ENCOUNTER — Ambulatory Visit (INDEPENDENT_AMBULATORY_CARE_PROVIDER_SITE_OTHER): Payer: Medicare Other | Admitting: Podiatry

## 2015-04-30 ENCOUNTER — Encounter: Payer: Self-pay | Admitting: Podiatry

## 2015-04-30 VITALS — BP 120/65 | HR 70 | Resp 16

## 2015-04-30 DIAGNOSIS — M898X9 Other specified disorders of bone, unspecified site: Secondary | ICD-10-CM | POA: Diagnosis not present

## 2015-04-30 DIAGNOSIS — L84 Corns and callosities: Secondary | ICD-10-CM

## 2015-04-30 DIAGNOSIS — M2041 Other hammer toe(s) (acquired), right foot: Secondary | ICD-10-CM | POA: Diagnosis not present

## 2015-04-30 NOTE — Progress Notes (Signed)
   Subjective:    Patient ID: Julia [name], female    DOB: 1934-06-28, 80 y.o.   MRN: [id number]  HPI Comments: "I have this corn I need trimmed"  Patient presents with: Toe Pain: 5th toe right - tender, callused area at the medial corner of the toenail, usually gets it trimmed periodically, but hasn't had that done in awhile, starting to hurt while wearing shoes and walking.    Toe Pain       Review of Systems  Musculoskeletal: Positive for gait problem.  All other systems reviewed and are negative.      Objective:   Physical Exam        Assessment & Plan:

## 2015-05-01 NOTE — Progress Notes (Signed)
Subjective:     Patient ID: Julia [name], female   DOB: 21-Jun-1934, 80 y.o.   MRN: [id number]  HPI patient states that I have a lot of pain in the fourth and fifth digits of my right foot with keratotic lesion formations and I've tried to trim them and pad them without relief   Review of Systems  All other systems reviewed and are negative.      Objective:   Physical Exam  Constitutional: She is oriented to person, place, and time.  Cardiovascular: Intact distal pulses.   Musculoskeletal: Normal range of motion.  Neurological: She is oriented to person, place, and time.  Skin: Skin is warm.  Nursing note and vitals reviewed.  neurovascular status intact muscle strength adequate range of motion within normal limits with patient noted to have distal keratotic lesion digit 5 right proximal side digit for right and on the outside of fifth digit right with pain with palpated. Patient is a previous digital surgery fifth right but it is not addressing the current conditions     Assessment:     Hammertoe deformity fourth fifth digits right with keratotic lesion secondary to digital structure    Plan:     H&P conditions reviewed with patient and debridement accomplished today. I advised this patient on padding therapy wider shoes and depending on how quickly symptoms come back may require arthroplasty

## 2015-05-05 ENCOUNTER — Other Ambulatory Visit: Payer: Self-pay | Admitting: Neurology

## 2015-05-05 NOTE — Telephone Encounter (Signed)
Last OV: 02/10/15 Next OV: 08/13/15

## 2015-06-03 ENCOUNTER — Other Ambulatory Visit: Payer: Self-pay | Admitting: Family Medicine

## 2015-07-09 ENCOUNTER — Other Ambulatory Visit (INDEPENDENT_AMBULATORY_CARE_PROVIDER_SITE_OTHER): Payer: Medicare Other

## 2015-07-09 DIAGNOSIS — Z0000 Encounter for general adult medical examination without abnormal findings: Secondary | ICD-10-CM

## 2015-07-09 LAB — POC URINALSYSI DIPSTICK (AUTOMATED)
Bilirubin, UA: NEGATIVE
Glucose, UA: NEGATIVE
KETONES UA: NEGATIVE
Nitrite, UA: NEGATIVE
PROTEIN UA: NEGATIVE
RBC UA: NEGATIVE
SPEC GRAV UA: 1.015
UROBILINOGEN UA: 1
pH, UA: 8.5

## 2015-07-09 LAB — BASIC METABOLIC PANEL
BUN: 28 mg/dL — ABNORMAL HIGH (ref 6–23)
CALCIUM: 9.4 mg/dL (ref 8.4–10.5)
CHLORIDE: 104 meq/L (ref 96–112)
CO2: 33 meq/L — AB (ref 19–32)
Creatinine, Ser: 1.26 mg/dL — ABNORMAL HIGH (ref 0.40–1.20)
GFR: 43.6 mL/min — ABNORMAL LOW (ref >60.00)
Glucose, Bld: 108 mg/dL — ABNORMAL HIGH (ref 70–99)
Potassium: 4.2 mEq/L (ref 3.5–5.1)
SODIUM: 141 meq/L (ref 135–145)

## 2015-07-09 LAB — CBC WITH DIFFERENTIAL/PLATELET
BASOS ABS: 0 10*3/uL (ref 0.0–0.1)
BASOS PCT: 0.8 % (ref 0.0–3.0)
EOS ABS: 0.1 10*3/uL (ref 0.0–0.7)
Eosinophils Relative: 2.2 % (ref 0.0–5.0)
HCT: 43.5 % (ref 36.0–46.0)
Hemoglobin: 14.2 g/dL (ref 12.0–15.0)
LYMPHS PCT: 31.8 % (ref 12.0–46.0)
Lymphs Abs: 1.6 10*3/uL (ref 0.7–4.0)
MCHC: 32.6 g/dL (ref 30.0–36.0)
MCV: 88.1 fl (ref 78.0–100.0)
MONOS PCT: 9.3 % (ref 3.0–12.0)
Monocytes Absolute: 0.5 10*3/uL (ref 0.1–1.0)
NEUTROS ABS: 2.9 10*3/uL (ref 1.4–7.7)
Neutrophils Relative %: 55.9 % (ref 43.0–77.0)
PLATELETS: 204 10*3/uL (ref 150.0–400.0)
RBC: 4.94 Mil/uL (ref 3.87–5.11)
RDW: 14.6 % (ref 11.5–15.5)
WBC: 5.1 10*3/uL (ref 4.0–10.5)

## 2015-07-09 LAB — LIPID PANEL
CHOL/HDL RATIO: 2
Cholesterol: 190 mg/dL (ref 0–200)
HDL: 81.8 mg/dL (ref >39.00)
LDL CALC: 91 mg/dL (ref 0–99)
NONHDL: 108.12
Triglycerides: 86 mg/dL (ref 0.0–149.0)
VLDL: 17.2 mg/dL (ref 0.0–40.0)

## 2015-07-09 LAB — HEPATIC FUNCTION PANEL
ALK PHOS: 58 U/L (ref 39–117)
ALT: 20 U/L (ref 0–35)
AST: 27 U/L (ref 0–37)
Albumin: 4.2 g/dL (ref 3.5–5.2)
BILIRUBIN DIRECT: 0.1 mg/dL (ref 0.0–0.3)
BILIRUBIN TOTAL: 0.6 mg/dL (ref 0.2–1.2)
Total Protein: 6.5 g/dL (ref 6.0–8.3)

## 2015-07-09 LAB — TSH: TSH: 2.94 u[IU]/mL (ref 0.35–4.50)

## 2015-07-16 ENCOUNTER — Encounter: Payer: Self-pay | Admitting: Adult Health

## 2015-07-16 ENCOUNTER — Ambulatory Visit (INDEPENDENT_AMBULATORY_CARE_PROVIDER_SITE_OTHER): Payer: Medicare Other | Admitting: Adult Health

## 2015-07-16 VITALS — BP 108/80 | Temp 97.4°F | Ht 62.8 in | Wt 117.0 lb

## 2015-07-16 DIAGNOSIS — Z0000 Encounter for general adult medical examination without abnormal findings: Secondary | ICD-10-CM | POA: Diagnosis not present

## 2015-07-16 DIAGNOSIS — G309 Alzheimer's disease, unspecified: Secondary | ICD-10-CM

## 2015-07-16 DIAGNOSIS — E785 Hyperlipidemia, unspecified: Secondary | ICD-10-CM

## 2015-07-16 DIAGNOSIS — I10 Essential (primary) hypertension: Secondary | ICD-10-CM

## 2015-07-16 DIAGNOSIS — F0280 Dementia in other diseases classified elsewhere without behavioral disturbance: Secondary | ICD-10-CM

## 2015-07-16 DIAGNOSIS — H6121 Impacted cerumen, right ear: Secondary | ICD-10-CM

## 2015-07-16 DIAGNOSIS — H9193 Unspecified hearing loss, bilateral: Secondary | ICD-10-CM

## 2015-07-16 NOTE — Patient Instructions (Addendum)
It was great seeing you again.   Your exam was great!   Continue to stay active and eat a healthy diet. I would like you to add more water throughout the day. You have gained 4 pounds since September.   Follow up with me in July for drivers license re exam.   Health Maintenance, Female Adopting a healthy lifestyle and getting preventive care can go a long way to promote health and wellness. Talk with your health care provider about what schedule of regular examinations is right for you. This is a good chance for you to check in with your provider about disease prevention and staying healthy. In between checkups, there are plenty of things you can do on your own. Experts have done a lot of research about which lifestyle changes and preventive measures are most likely to keep you healthy. Ask your health care provider for more information. WEIGHT AND DIET  Eat a healthy diet  Be sure to include plenty of vegetables, fruits, low-fat dairy products, and lean protein.  Do not eat a lot of foods high in solid fats, added sugars, or salt.  Get regular exercise. This is one of the most important things you can do for your health.  Most adults should exercise for at least 150 minutes each week. The exercise should increase your heart rate and make you sweat (moderate-intensity exercise).  Most adults should also do strengthening exercises at least twice a week. This is in addition to the moderate-intensity exercise.  Maintain a healthy weight  Body mass index (BMI) is a measurement that can be used to identify possible weight problems. It estimates body fat based on height and weight. Your health care provider can help determine your BMI and help you achieve or maintain a healthy weight.  For females 9 years of age and older:   A BMI below 18.5 is considered underweight.  A BMI of 18.5 to 24.9 is normal.  A BMI of 25 to 29.9 is considered overweight.  A BMI of 30 and above is considered  obese.  Watch levels of cholesterol and blood lipids  You should start having your blood tested for lipids and cholesterol at 79 years of age, then have this test every 5 years.  You may need to have your cholesterol levels checked more often if:  Your lipid or cholesterol levels are high.  You are older than 79 years of age.  You are at high risk for heart disease.  CANCER SCREENING   Lung Cancer  Lung cancer screening is recommended for adults 68-33 years old who are at high risk for lung cancer because of a history of smoking.  A yearly low-dose CT scan of the lungs is recommended for people who:  Currently smoke.  Have quit within the past 15 years.  Have at least a 30-pack-year history of smoking. A pack year is smoking an average of one pack of cigarettes a day for 1 year.  Yearly screening should continue until it has been 15 years since you quit.  Yearly screening should stop if you develop a health problem that would prevent you from having lung cancer treatment.  Breast Cancer  Practice breast self-awareness. This means understanding how your breasts normally appear and feel.  It also means doing regular breast self-exams. Let your health care provider know about any changes, no matter how small.  If you are in your 20s or 30s, you should have a clinical breast exam (CBE) by a  health care provider every 1-3 years as part of a regular health exam.  If you are 40 or older, have a CBE every year. Also consider having a breast X-ray (mammogram) every year.  If you have a family history of breast cancer, talk to your health care provider about genetic screening.  If you are at high risk for breast cancer, talk to your health care provider about having an MRI and a mammogram every year.  Breast cancer gene (BRCA) assessment is recommended for women who have family members with BRCA-related cancers. BRCA-related cancers  include:  Breast.  Ovarian.  Tubal.  Peritoneal cancers.  Results of the assessment will determine the need for genetic counseling and BRCA1 and BRCA2 testing. Cervical Cancer Your health care provider may recommend that you be screened regularly for cancer of the pelvic organs (ovaries, uterus, and vagina). This screening involves a pelvic examination, including checking for microscopic changes to the surface of your cervix (Pap test). You may be encouraged to have this screening done every 3 years, beginning at age 21.  For women ages 30-65, health care providers may recommend pelvic exams and Pap testing every 3 years, or they may recommend the Pap and pelvic exam, combined with testing for human papilloma virus (HPV), every 5 years. Some types of HPV increase your risk of cervical cancer. Testing for HPV may also be done on women of any age with unclear Pap test results.  Other health care providers may not recommend any screening for nonpregnant women who are considered low risk for pelvic cancer and who do not have symptoms. Ask your health care provider if a screening pelvic exam is right for you.  If you have had past treatment for cervical cancer or a condition that could lead to cancer, you need Pap tests and screening for cancer for at least 20 years after your treatment. If Pap tests have been discontinued, your risk factors (such as having a new sexual partner) need to be reassessed to determine if screening should resume. Some women have medical problems that increase the chance of getting cervical cancer. In these cases, your health care provider may recommend more frequent screening and Pap tests. Colorectal Cancer  This type of cancer can be detected and often prevented.  Routine colorectal cancer screening usually begins at 79 years of age and continues through 79 years of age.  Your health care provider may recommend screening at an earlier age if you have risk factors for  colon cancer.  Your health care provider may also recommend using home test kits to check for hidden blood in the stool.  A small camera at the end of a tube can be used to examine your colon directly (sigmoidoscopy or colonoscopy). This is done to check for the earliest forms of colorectal cancer.  Routine screening usually begins at age 50.  Direct examination of the colon should be repeated every 5-10 years through 79 years of age. However, you may need to be screened more often if early forms of precancerous polyps or small growths are found. Skin Cancer  Check your skin from head to toe regularly.  Tell your health care provider about any new moles or changes in moles, especially if there is a change in a mole's shape or color.  Also tell your health care provider if you have a mole that is larger than the size of a pencil eraser.  Always use sunscreen. Apply sunscreen liberally and repeatedly throughout the day.    Protect yourself by wearing long sleeves, pants, a wide-brimmed hat, and sunglasses whenever you are outside. HEART DISEASE, DIABETES, AND HIGH BLOOD PRESSURE   High blood pressure causes heart disease and increases the risk of stroke. High blood pressure is more likely to develop in:  People who have blood pressure in the high end of the normal range (130-139/85-89 mm Hg).  People who are overweight or obese.  People who are African American.  If you are 18-39 years of age, have your blood pressure checked every 3-5 years. If you are 40 years of age or older, have your blood pressure checked every year. You should have your blood pressure measured twice--once when you are at a hospital or clinic, and once when you are not at a hospital or clinic. Record the average of the two measurements. To check your blood pressure when you are not at a hospital or clinic, you can use:  An automated blood pressure machine at a pharmacy.  A home blood pressure monitor.  If you  are between 55 years and 79 years old, ask your health care provider if you should take aspirin to prevent strokes.  Have regular diabetes screenings. This involves taking a blood sample to check your fasting blood sugar level.  If you are at a normal weight and have a low risk for diabetes, have this test once every three years after 79 years of age.  If you are overweight and have a high risk for diabetes, consider being tested at a younger age or more often. PREVENTING INFECTION  Hepatitis B  If you have a higher risk for hepatitis B, you should be screened for this virus. You are considered at high risk for hepatitis B if:  You were born in a country where hepatitis B is common. Ask your health care provider which countries are considered high risk.  Your parents were born in a high-risk country, and you have not been immunized against hepatitis B (hepatitis B vaccine).  You have HIV or AIDS.  You use needles to inject street drugs.  You live with someone who has hepatitis B.  You have had sex with someone who has hepatitis B.  You get hemodialysis treatment.  You take certain medicines for conditions, including cancer, organ transplantation, and autoimmune conditions. Hepatitis C  Blood testing is recommended for:  Everyone born from 1945 through 1965.  Anyone with known risk factors for hepatitis C. Sexually transmitted infections (STIs)  You should be screened for sexually transmitted infections (STIs) including gonorrhea and chlamydia if:  You are sexually active and are younger than 79 years of age.  You are older than 79 years of age and your health care provider tells you that you are at risk for this type of infection.  Your sexual activity has changed since you were last screened and you are at an increased risk for chlamydia or gonorrhea. Ask your health care provider if you are at risk.  If you do not have HIV, but are at risk, it may be recommended that you  take a prescription medicine daily to prevent HIV infection. This is called pre-exposure prophylaxis (PrEP). You are considered at risk if:  You are sexually active and do not regularly use condoms or know the HIV status of your partner(s).  You take drugs by injection.  You are sexually active with a partner who has HIV. Talk with your health care provider about whether you are at high risk of being infected   with HIV. If you choose to begin PrEP, you should first be tested for HIV. You should then be tested every 3 months for as long as you are taking PrEP.  PREGNANCY   If you are premenopausal and you may become pregnant, ask your health care provider about preconception counseling.  If you may become pregnant, take 400 to 800 micrograms (mcg) of folic acid every day.  If you want to prevent pregnancy, talk to your health care provider about birth control (contraception). OSTEOPOROSIS AND MENOPAUSE   Osteoporosis is a disease in which the bones lose minerals and strength with aging. This can result in serious bone fractures. Your risk for osteoporosis can be identified using a bone density scan.  If you are 28 years of age or older, or if you are at risk for osteoporosis and fractures, ask your health care provider if you should be screened.  Ask your health care provider whether you should take a calcium or vitamin D supplement to lower your risk for osteoporosis.  Menopause may have certain physical symptoms and risks.  Hormone replacement therapy may reduce some of these symptoms and risks. Talk to your health care provider about whether hormone replacement therapy is right for you.  HOME CARE INSTRUCTIONS   Schedule regular health, dental, and eye exams.  Stay current with your immunizations.   Do not use any tobacco products including cigarettes, chewing tobacco, or electronic cigarettes.  If you are pregnant, do not drink alcohol.  If you are breastfeeding, limit how  much and how often you drink alcohol.  Limit alcohol intake to no more than 1 drink per day for nonpregnant women. One drink equals 12 ounces of beer, 5 ounces of wine, or 1 ounces of hard liquor.  Do not use street drugs.  Do not share needles.  Ask your health care provider for help if you need support or information about quitting drugs.  Tell your health care provider if you often feel depressed.  Tell your health care provider if you have ever been abused or do not feel safe at home.   This information is not intended to replace advice given to you by your health care provider. Make sure you discuss any questions you have with your health care provider.   Document Released: 11/29/2010 Document Revised: 06/06/2014 Document Reviewed: 04/17/2013 Elsevier Interactive Patient Education [employer].

## 2015-07-16 NOTE — Progress Notes (Signed)
Subjective:    Patient ID: Julia [name], female    DOB: [birth year], 81 y.o.   MRN: [id number]  HPI  Patient presents for yearly preventative medicine examination. She is a very pleasant woman with  has a past medical history of GERD (gastroesophageal reflux disease); Hypertension; Hyperlipidemia; Osteopenia; History of colonic polyps; Right forearm fracture (2011); Anxiety; Thyroid disease; Concussion with no loss of consciousness (05/14/2008); Dementia; and echocardiogram. She is accompanied by her daughter who helps provide history.   Medicare questionnaire was completed  All immunizations and health maintenance protocols were reviewed with the patient and needed orders were placed.  Appropriate screening laboratory values were ordered for the patient including screening of hyperlipidemia, renal function and hepatic function. If indicated by BPH, a PSA was ordered.  Medication reconciliation,  past medical history, social history, problem list and allergies were reviewed in detail with the patient  Goals were established with regard to weight gain, exercise, and  diet in compliance with medications. She has put on about 4 pounds since her last visit. She continues not to drink enough fluids throughout the day.   End of life planning was discussed. She has an advanced directives and living will.   She continues to live independently with her husband. He cooks and she helps with dishes and laundry. She has had a formal driving exam done at the [facility]. She is able to drive with restrictions of no night driving, no driving over 72 MPH, has to stay in a 10 mile radius and is not to drive on the highway. She is ok with this as is her daughter.   She is followed by Dr. Tomi Likens for dementia and is currently on galantamine ER 16mg  daily and memantine XR 28mg  daily. She last saw Dr. Tomi Likens in September. She has a follow up appointment in March. Family reports that they have not seen any further  decline.   Review of Systems  Constitutional: Negative.   HENT: Positive for hearing loss.   Eyes: Negative.   Respiratory: Negative.   Cardiovascular: Negative.   Gastrointestinal: Negative.   Endocrine: Negative.   Genitourinary: Negative.   Allergic/Immunologic: Negative.   Neurological: Negative.   Hematological: Negative.   Psychiatric/Behavioral: Negative.   All other systems reviewed and are negative.  Past Medical History  Diagnosis Date  . GERD (gastroesophageal reflux disease)   . Hypertension   . Hyperlipidemia   . Osteopenia   . History of colonic polyps   . Right forearm fracture 2011  . Anxiety   . Thyroid disease     hypothyroidism  . Concussion with no loss of consciousness 05/14/2008    Qualifier: Diagnosis of  By: Arnoldo Morale MD, Balinda Quails   . Dementia   . Hx of echocardiogram     Echo 4/16:  EF 55-60%, mild LVH    Social History   Social History  . Marital Status: Married    Spouse Name: N/A  . Number of Children: N/A  . Years of Education: N/A   Occupational History  . retired    Social History Main Topics  . Smoking status: Never Smoker   . Smokeless tobacco: Never Used  . Alcohol Use: No  . Drug Use: No  . Sexual Activity: No   Other Topics Concern  . Not on file   Social History Narrative   Married to husband Ardyth Gal for 26 years and lives in [place]. 2 daughters live in [place], 1 son in [place] and  1 in [place].       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       Hobbies: tv, caring for cat (very important to her), change is difficult even with type of tv      Jake Samples (daughter) Chauncey Reading [phone]   Full Code    Past Surgical History  Procedure Laterality Date  . Rotator cuff repair      right shoulder  . Bladder tack    . Tonsillectomy    . Bilateral salpingoophorectomy    . Surgical repair to fx left wrist    . Abdominal hysterectomy  2009    Family History  Problem Relation Age of Onset  .  Lung cancer Father   . Hypertension Mother   . COPD Mother   . Stomach cancer Father   . Heart disease Brother   . Alzheimer's disease Maternal Aunt   . Drug abuse Maternal Uncle   . Cancer Maternal Uncle   . Colon cancer Neg Hx   . Esophageal cancer Neg Hx   . Rectal cancer Neg Hx   . Heart attack Neg Hx   . Stroke Neg Hx   . Tuberculosis Mother     x 2    Allergies  Allergen Reactions  . Entex Other (See Comments)    unknown  . Sulfonamide Derivatives Other (See Comments)    unknown    Current Outpatient Prescriptions on File Prior to Visit  Medication Sig Dispense Refill  . ALPRAZolam (XANAX) 0.25 MG tablet Take 1 tablet (0.25 mg total) by mouth at bedtime as needed for anxiety. 60 tablet 0  . amLODipine (NORVASC) 5 MG tablet TAKE 1 TABLET BY MOUTH DAILY 30 tablet 5  . aspirin 81 MG tablet Take 81 mg by mouth at bedtime.     . Bismuth Subsalicylate (KAOPECTATE PO) Take by mouth.    . galantamine (RAZADYNE ER) 16 MG 24 hr capsule take 1 capsule by mouth once daily with BREAKFAST 30 capsule 3  . Ibuprofen 200 MG CAPS Take 1 capsule (200 mg total) by mouth daily as needed. (Patient taking differently: Take 200 mg by mouth daily as needed. For pain) 120 each 0  . imipramine (TOFRANIL) 50 MG tablet Take 1 tablets in evening (50mg ) (Patient taking differently: Take 50 mg by mouth every evening. Take 1 tablets in evening (50mg )) 90 tablet 3  . levothyroxine (SYNTHROID, LEVOTHROID) 88 MCG tablet TAKE 1 TABLET BY MOUTH DAILY BEFORE BREAKFAST 90 tablet 2  . memantine (NAMENDA XR) 28 MG CP24 24 hr capsule Take 1 capsule (28 mg total) by mouth daily. 30 capsule 6  . mirabegron ER (MYRBETRIQ) 25 MG TB24 tablet Take 25 mg by mouth daily.    . Multiple Vitamin (MULTIVITAMIN) tablet Take 1 tablet by mouth daily.    . Omega-3 Fatty Acids (FISH OIL) 1000 MG CAPS Take 1,000 mg by mouth daily.    . polyethylene glycol (MIRALAX / GLYCOLAX) packet Take 17 g by mouth daily.    . rosuvastatin  (CRESTOR) 20 MG tablet Take 1 tablet (20 mg total) by mouth once a week. 15 tablet 3  . sertraline (ZOLOFT) 50 MG tablet 50 mg. Take one and half tab daily    . traZODone (DESYREL) 50 MG tablet TAKE 1 TABLET BY MOUTH AT BEDTIME AS NEEDED FOR SLEEP 30 tablet 5   No current facility-administered medications on file prior to visit.    BP 108/80 mmHg  Temp(Src) 97.4 F (36.3 C) (  Oral)  Ht 5' 2.8" (1.595 m)  Wt 117 lb (53.071 kg)  BMI 20.86 kg/m2       Objective:   Physical Exam  Constitutional: She is oriented to person, place, and time. She appears well-developed and well-nourished. No distress.  HENT:  Head: Normocephalic and atraumatic.  Right Ear: External ear normal.  Left Ear: External ear normal.  Nose: Nose normal.  Mouth/Throat: Oropharynx is clear and moist. No oropharyngeal exudate.  Unable to visualize TM in right ear due to cerumen impaction.  No cerumen impaction   Eyes: Conjunctivae and EOM are normal. Pupils are equal, round, and reactive to light. Right eye exhibits no discharge. Left eye exhibits no discharge. No scleral icterus.  Neck: Neck supple. No JVD present. No tracheal deviation present. No thyromegaly present.  Cardiovascular: Normal rate, regular rhythm, normal heart sounds and intact distal pulses.  Exam reveals no gallop and no friction rub.   No murmur heard. Pulmonary/Chest: Effort normal and breath sounds normal. No stridor. No respiratory distress. She has no wheezes. She has no rales. She exhibits no tenderness.  Abdominal: Soft. Bowel sounds are normal. She exhibits no distension and no mass. There is no tenderness. There is no rebound and no guarding.  Genitourinary:  Refused breast exam  Musculoskeletal: Normal range of motion. She exhibits no edema or tenderness.  Lymphadenopathy:    She has no cervical adenopathy.  Neurological: She is alert and oriented to person, place, and time. She has normal reflexes. She displays normal reflexes. No  cranial nerve deficit. She exhibits normal muscle tone. Coordination normal.  Skin: Skin is warm and dry. No rash noted. She is not diaphoretic. No erythema. No pallor.  Psychiatric: She has a normal mood and affect. Her behavior is normal. Judgment and thought content normal.  Nursing note and vitals reviewed.     Assessment & Plan:  1. Routine general medical examination at a health care facility - EKG 12-Lead- NSR, rate 63 - Follow up in one year for next physical exam and 6 months for routine follow up.  - Follow up sooner if needed - Reviewed labs in detail with patient an family.  2. Essential hypertension - Controlled with current medication therapy.  - EKG 12-Lead  3. Alzheimer's dementia - continue with galantamine ER 16mg  daily and memantine XR 28mg  daily - Follow up with Dr. Tomi Likens as directed 4. Hyperlipidemia - Well controlled on current medication - EKG 12-Lead  5. Cerumen impaction, right - Cerumen impaction in right ear was able to be easily dislodged with irrigation. Trace bleeding after removal. No signs of infection.   6. Hearing loss, bilateral - Hearing did not improve that much after cerumen impaction was dislodged,  - Ambulatory referral to Audiology

## 2015-08-03 ENCOUNTER — Encounter (HOSPITAL_BASED_OUTPATIENT_CLINIC_OR_DEPARTMENT_OTHER): Payer: Self-pay | Admitting: *Deleted

## 2015-08-03 ENCOUNTER — Other Ambulatory Visit: Payer: Self-pay | Admitting: Orthopedic Surgery

## 2015-08-04 ENCOUNTER — Ambulatory Visit (HOSPITAL_BASED_OUTPATIENT_CLINIC_OR_DEPARTMENT_OTHER): Payer: Medicare Other | Admitting: Anesthesiology

## 2015-08-04 ENCOUNTER — Ambulatory Visit (HOSPITAL_BASED_OUTPATIENT_CLINIC_OR_DEPARTMENT_OTHER)
Admission: RE | Admit: 2015-08-04 | Discharge: 2015-08-04 | Disposition: A | Discharge status: Home / Self Care | Payer: Medicare Other | Source: Ambulatory Visit | Attending: Orthopedic Surgery | Admitting: Orthopedic Surgery

## 2015-08-04 ENCOUNTER — Encounter (HOSPITAL_BASED_OUTPATIENT_CLINIC_OR_DEPARTMENT_OTHER): Payer: Self-pay | Admitting: *Deleted

## 2015-08-04 ENCOUNTER — Encounter (HOSPITAL_BASED_OUTPATIENT_CLINIC_OR_DEPARTMENT_OTHER)
Admission: RE | Disposition: A | Discharge status: Home / Self Care | Payer: Self-pay | Source: Ambulatory Visit | Attending: Orthopedic Surgery

## 2015-08-04 DIAGNOSIS — F0280 Dementia in other diseases classified elsewhere without behavioral disturbance: Secondary | ICD-10-CM | POA: Insufficient documentation

## 2015-08-04 DIAGNOSIS — I10 Essential (primary) hypertension: Secondary | ICD-10-CM | POA: Diagnosis not present

## 2015-08-04 DIAGNOSIS — L729 Follicular cyst of the skin and subcutaneous tissue, unspecified: Secondary | ICD-10-CM | POA: Insufficient documentation

## 2015-08-04 DIAGNOSIS — E785 Hyperlipidemia, unspecified: Secondary | ICD-10-CM | POA: Diagnosis not present

## 2015-08-04 DIAGNOSIS — F329 Major depressive disorder, single episode, unspecified: Secondary | ICD-10-CM | POA: Insufficient documentation

## 2015-08-04 DIAGNOSIS — G309 Alzheimer's disease, unspecified: Secondary | ICD-10-CM | POA: Diagnosis not present

## 2015-08-04 DIAGNOSIS — Z7982 Long term (current) use of aspirin: Secondary | ICD-10-CM | POA: Diagnosis not present

## 2015-08-04 DIAGNOSIS — Z888 Allergy status to other drugs, medicaments and biological substances status: Secondary | ICD-10-CM | POA: Diagnosis not present

## 2015-08-04 DIAGNOSIS — K219 Gastro-esophageal reflux disease without esophagitis: Secondary | ICD-10-CM | POA: Diagnosis not present

## 2015-08-04 DIAGNOSIS — E039 Hypothyroidism, unspecified: Secondary | ICD-10-CM | POA: Insufficient documentation

## 2015-08-04 DIAGNOSIS — M19042 Primary osteoarthritis, left hand: Secondary | ICD-10-CM | POA: Insufficient documentation

## 2015-08-04 DIAGNOSIS — Z79899 Other long term (current) drug therapy: Secondary | ICD-10-CM | POA: Diagnosis not present

## 2015-08-04 DIAGNOSIS — Z882 Allergy status to sulfonamides status: Secondary | ICD-10-CM | POA: Diagnosis not present

## 2015-08-04 DIAGNOSIS — Z8673 Personal history of transient ischemic attack (TIA), and cerebral infarction without residual deficits: Secondary | ICD-10-CM | POA: Insufficient documentation

## 2015-08-04 DIAGNOSIS — F419 Anxiety disorder, unspecified: Secondary | ICD-10-CM | POA: Insufficient documentation

## 2015-08-04 DIAGNOSIS — M8580 Other specified disorders of bone density and structure, unspecified site: Secondary | ICD-10-CM | POA: Insufficient documentation

## 2015-08-04 HISTORY — DX: Dementia in other diseases classified elsewhere without behavioral disturbance: F02.80

## 2015-08-04 HISTORY — PX: INCISION AND DRAINAGE OF WOUND: SHX1803

## 2015-08-04 HISTORY — PX: MASS EXCISION: SHX2000

## 2015-08-04 HISTORY — DX: Alzheimer's disease, unspecified: G30.9

## 2015-08-04 HISTORY — DX: Hypothyroidism, unspecified: E03.9

## 2015-08-04 SURGERY — EXCISION MASS
Anesthesia: Regional

## 2015-08-04 MED ORDER — PROPOFOL 10 MG/ML IV BOLUS
INTRAVENOUS | Status: DC | PRN
  Administered 2015-08-04: 20 mg via INTRAVENOUS

## 2015-08-04 MED ORDER — HYDROMORPHONE HCL 1 MG/ML IJ SOLN: 0.2500 mg | INTRAMUSCULAR | Status: DC | PRN

## 2015-08-04 MED ORDER — HYDROCODONE-ACETAMINOPHEN 5-325 MG PO TABS: 1.0000 | ORAL_TABLET | Freq: Four times a day (QID) | ORAL | Status: DC | PRN

## 2015-08-04 MED ORDER — FENTANYL CITRATE (PF) 100 MCG/2ML IJ SOLN: 50.0000 ug | INTRAMUSCULAR | Status: DC | PRN

## 2015-08-04 MED ORDER — LACTATED RINGERS IV SOLN
INTRAVENOUS | Status: DC
  Administered 2015-08-04: 13:00:00 via INTRAVENOUS

## 2015-08-04 MED ORDER — ONDANSETRON HCL 4 MG/2ML IJ SOLN
INTRAMUSCULAR | Status: DC | PRN
  Administered 2015-08-04: 4 mg via INTRAVENOUS

## 2015-08-04 MED ORDER — BUPIVACAINE HCL (PF) 0.25 % IJ SOLN
INTRAMUSCULAR | Status: DC | PRN
  Administered 2015-08-04: 10 mL

## 2015-08-04 MED ORDER — OXYCODONE HCL 5 MG PO TABS: 5.0000 mg | ORAL_TABLET | Freq: Once | ORAL | Status: DC | PRN

## 2015-08-04 MED ORDER — CEFAZOLIN SODIUM-DEXTROSE 2-3 GM-% IV SOLR
INTRAVENOUS | Status: AC
  Filled 2015-08-04: qty 50

## 2015-08-04 MED ORDER — CHLORHEXIDINE GLUCONATE 4 % EX LIQD: 60.0000 mL | Freq: Once | CUTANEOUS | Status: DC

## 2015-08-04 MED ORDER — FENTANYL CITRATE (PF) 100 MCG/2ML IJ SOLN
INTRAMUSCULAR | Status: AC
  Filled 2015-08-04: qty 2

## 2015-08-04 MED ORDER — 0.9 % SODIUM CHLORIDE (POUR BTL) OPTIME
TOPICAL | Status: DC | PRN
  Administered 2015-08-04: 200 mL

## 2015-08-04 MED ORDER — CEFAZOLIN SODIUM-DEXTROSE 2-3 GM-% IV SOLR
2.0000 g | INTRAVENOUS | Status: AC
  Administered 2015-08-04: 2 g via INTRAVENOUS

## 2015-08-04 MED ORDER — SCOPOLAMINE 1 MG/3DAYS TD PT72: 1.0000 | MEDICATED_PATCH | Freq: Once | TRANSDERMAL | Status: DC | PRN

## 2015-08-04 MED ORDER — FENTANYL CITRATE (PF) 100 MCG/2ML IJ SOLN
INTRAMUSCULAR | Status: DC | PRN
  Administered 2015-08-04 (×2): 50 ug via INTRAVENOUS

## 2015-08-04 MED ORDER — GLYCOPYRROLATE 0.2 MG/ML IJ SOLN: 0.2000 mg | Freq: Once | INTRAMUSCULAR | Status: DC | PRN

## 2015-08-04 MED ORDER — MIDAZOLAM HCL 2 MG/2ML IJ SOLN: 1.0000 mg | INTRAMUSCULAR | Status: DC | PRN

## 2015-08-04 MED ORDER — MEPERIDINE HCL 25 MG/ML IJ SOLN: 6.2500 mg | INTRAMUSCULAR | Status: DC | PRN

## 2015-08-04 MED ORDER — OXYCODONE HCL 5 MG/5ML PO SOLN: 5.0000 mg | Freq: Once | ORAL | Status: DC | PRN

## 2015-08-04 SURGICAL SUPPLY — 59 items
BAG DECANTER FOR FLEXI CONT (MISCELLANEOUS) IMPLANT
BANDAGE COBAN STERILE 2 (GAUZE/BANDAGES/DRESSINGS) IMPLANT
BLADE MINI RND TIP GREEN BEAV (BLADE) ×2 IMPLANT
BLADE SURG 15 STRL LF DISP TIS (BLADE) ×2 IMPLANT
BLADE SURG 15 STRL SS (BLADE) ×4
BNDG CMPR 9X4 STRL LF SNTH (GAUZE/BANDAGES/DRESSINGS) ×2
BNDG COHESIVE 1X5 TAN STRL LF (GAUZE/BANDAGES/DRESSINGS) ×2 IMPLANT
BNDG COHESIVE 3X5 TAN STRL LF (GAUZE/BANDAGES/DRESSINGS) ×2 IMPLANT
BNDG ESMARK 4X9 LF (GAUZE/BANDAGES/DRESSINGS) ×2 IMPLANT
BNDG GAUZE ELAST 4 BULKY (GAUZE/BANDAGES/DRESSINGS) ×2 IMPLANT
CHLORAPREP W/TINT 26ML (MISCELLANEOUS) ×4 IMPLANT
CORDS BIPOLAR (ELECTRODE) ×4 IMPLANT
COVER BACK TABLE 60X90IN (DRAPES) ×4 IMPLANT
COVER MAYO STAND STRL (DRAPES) ×4 IMPLANT
CUFF TOURNIQUET SINGLE 18IN (TOURNIQUET CUFF) ×4 IMPLANT
DECANTER SPIKE VIAL GLASS SM (MISCELLANEOUS) IMPLANT
DRAIN PENROSE 1/2X12 LTX STRL (WOUND CARE) IMPLANT
DRAPE EXTREMITY T 121X128X90 (DRAPE) ×4 IMPLANT
DRAPE SURG 17X23 STRL (DRAPES) ×4 IMPLANT
GAUZE PACKING IODOFORM 1/4X15 (GAUZE/BANDAGES/DRESSINGS) IMPLANT
GAUZE SPONGE 4X4 12PLY STRL (GAUZE/BANDAGES/DRESSINGS) ×4 IMPLANT
GAUZE XEROFORM 1X8 LF (GAUZE/BANDAGES/DRESSINGS) ×4 IMPLANT
GLOVE BIO SURGEON STRL SZ7.5 (GLOVE) ×2 IMPLANT
GLOVE BIOGEL PI IND STRL 7.0 (GLOVE) IMPLANT
GLOVE BIOGEL PI IND STRL 8 (GLOVE) IMPLANT
GLOVE BIOGEL PI IND STRL 8.5 (GLOVE) ×2 IMPLANT
GLOVE BIOGEL PI INDICATOR 7.0 (GLOVE) ×4
GLOVE BIOGEL PI INDICATOR 8 (GLOVE) ×2
GLOVE BIOGEL PI INDICATOR 8.5 (GLOVE) ×2
GLOVE ECLIPSE 6.5 STRL STRAW (GLOVE) ×2 IMPLANT
GLOVE SURG ORTHO 8.0 STRL STRW (GLOVE) ×4 IMPLANT
GOWN STRL REUS W/ TWL LRG LVL3 (GOWN DISPOSABLE) ×2 IMPLANT
GOWN STRL REUS W/TWL LRG LVL3 (GOWN DISPOSABLE) ×4
GOWN STRL REUS W/TWL XL LVL3 (GOWN DISPOSABLE) ×6 IMPLANT
LOOP VESSEL MAXI BLUE (MISCELLANEOUS) IMPLANT
NDL PRECISIONGLIDE 27X1.5 (NEEDLE) IMPLANT
NDL SAFETY ECLIPSE 18X1.5 (NEEDLE) IMPLANT
NEEDLE HYPO 18GX1.5 SHARP (NEEDLE)
NEEDLE PRECISIONGLIDE 27X1.5 (NEEDLE) ×4 IMPLANT
NS IRRIG 1000ML POUR BTL (IV SOLUTION) ×4 IMPLANT
PACK BASIN DAY SURGERY FS (CUSTOM PROCEDURE TRAY) ×4 IMPLANT
PAD CAST 3X4 CTTN HI CHSV (CAST SUPPLIES) ×2 IMPLANT
PADDING CAST ABS 4INX4YD NS (CAST SUPPLIES)
PADDING CAST ABS COTTON 4X4 ST (CAST SUPPLIES) ×2 IMPLANT
PADDING CAST COTTON 3X4 STRL (CAST SUPPLIES)
SPLINT FINGER 3.25 BULB 911905 (SOFTGOODS) ×2 IMPLANT
SPLINT PLASTER CAST XFAST 3X15 (CAST SUPPLIES) IMPLANT
SPLINT PLASTER XTRA FASTSET 3X (CAST SUPPLIES)
STOCKINETTE 4X48 STRL (DRAPES) ×4 IMPLANT
SUT ETHILON 4 0 PS 2 18 (SUTURE) ×2 IMPLANT
SUT VIC AB 4-0 P2 18 (SUTURE) IMPLANT
SUT VICRYL 4-0 PS2 18IN ABS (SUTURE) IMPLANT
SWAB COLLECTION DEVICE MRSA (MISCELLANEOUS) IMPLANT
SWAB CULTURE ESWAB REG 1ML (MISCELLANEOUS) IMPLANT
SYR BULB 3OZ (MISCELLANEOUS) ×4 IMPLANT
SYR CONTROL 10ML LL (SYRINGE) ×2 IMPLANT
TOWEL OR 17X24 6PK STRL BLUE (TOWEL DISPOSABLE) ×8 IMPLANT
TUBE FEEDING 5FR 15 INCH (TUBING) IMPLANT
UNDERPAD 30X30 (UNDERPADS AND DIAPERS) ×4 IMPLANT

## 2015-08-04 NOTE — Op Note (Signed)
NAMELINDITA, Julia [name]            ACCOUNT NO.:  [id number]  MEDICAL RECORD NO.:  [id number]  LOCATION:                                 FACILITY:  PHYSICIAN:  Daryll Brod, M.D.       DATE OF BIRTH:  [birth year]  DATE OF PROCEDURE:  08/04/2015 DATE OF DISCHARGE:                              OPERATIVE REPORT   PREOPERATIVE DIAGNOSIS:  Mucoid tumor, left index finger with degenerative arthritis, distal interphalangeal joint.  POSTOPERATIVE DIAGNOSIS:  Mucoid tumor, left index finger with degenerative arthritis, distal interphalangeal joint.  OPERATION:  Excision mucoid cyst with debridement at distal interphalangeal joint, left index finger.  SURGEON:  Daryll Brod, MD  ASSISTANT:  Leanora Cover, MD  ANESTHESIA:  Forearm IV regional with local infiltration.  ANESTHESIOLOGIST:  Lorrene Reid, MD  HISTORY:  The patient is a 81 year old female with a history of large mucoid cyst over the distal interphalangeal joint, left index finger. She was admitted for removal, possible rotation flap.  Pre, peri, and postoperative course have been discussed along with risks and complications.  She is aware there is no guarantee with surgery; possibility of infection; recurrence of injury to arteries, nerves, tendons; incomplete relief of symptoms; dystrophy.  In the preoperative area, the patient was seen, the extremity marked by both the patient and surgeon.  Antibiotic given.  PROCEDURE IN DETAIL:  The patient was given a metacarpal block with 0.25% bupivacaine without epinephrine, 8 mL was used.  The tourniquet had been inflated for the forearm IV regional.  After adequate anesthesia was afforded, curvilinear incision was made over the distal interphalangeal joint, along the lateral margin of the ulnar side of her index finger carried down through subcutaneous tissue.  Skin appeared to be much better than it appeared in the office without translucency with the thickness enough to assume this  would be able to heal.  With blunt and sharp dissection, it was dissected free, this was sent to Pathology. The joint was then opened on its ulnar aspect.  The area of egress from the joint was immediately apparent with a hemostatic rongeur.  The joint was debrided and a synovectomy performed using a small rongeur, specimen was sent to Pathology.  The wound was copiously irrigated with saline. The skin then closed with interrupted 5-0 nylon sutures.  A compressive dressing splint to the index finger was applied.  On deflation of the tourniquet, remaining fingers pinked.  She was taken to the recovery room for observation in satisfactory condition.  She will be discharged home to return to [place] in 1 week on Nor[place].          ______________________________ Daryll Brod, M.D.     GK/MEDQ  D:  08/04/2015  T:  08/04/2015  Job:  [id number]

## 2015-08-04 NOTE — H&P (Signed)
Julia [name] is an 81 y.o. female.   Chief Complaint:lump left index HPI: Julia [name] is a 81 year old right hand dominant female referred By Julia [name] with pain in both hands and mass on her left index finger distal interphalangeal joint. She is right-handed. 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 She is not complaining of significant pain or discomfort with this. She is not complaining of pain with the mass on her index fingers and her husband state that it has been present for 30-days. She is status post left distal radius fracture, she does not remember who did this nor when it was done. She is not complaining of significant discomfort with it. She states that she has a questionable history of thyroid problems, no history of diabetes, arthritis or gout. Unfortunately, she did not bring any of her medicines with her. She is not complaining of significant pain in neither hand.  Past Medical History  Diagnosis Date  . Depression  . Hypertension  . Stroke [facility])   Past Surgical History  Procedure Laterality Date  . Hysterectomy  . Bladder surgery  "tacked several times"    Past Medical History  Diagnosis Date  . GERD (gastroesophageal reflux disease)   . Hypertension   . Hyperlipidemia   . Osteopenia   . History of colonic polyps   . Right forearm fracture 2011  . Anxiety   . Thyroid disease     hypothyroidism  . Concussion with no loss of consciousness 05/14/2008    Qualifier: Diagnosis of  By: Julia [name]   . Dementia   . Hx of echocardiogram     Echo 4/16:  EF 55-60%, mild LVH  . Alzheimer's dementia   . Hypothyroidism     Past Surgical History  Procedure Laterality Date  . Rotator cuff repair      right shoulder  . Bladder tack    . Tonsillectomy    . Bilateral salpingoophorectomy    . Surgical repair to fx left wrist    . Abdominal hysterectomy  2009    Family History  Problem Relation Age of  Onset  . Lung cancer Father   . Hypertension Mother   . COPD Mother   . Stomach cancer Father   . Heart disease Brother   . Alzheimer's disease Maternal Aunt   . Drug abuse Maternal Uncle   . Cancer Maternal Uncle   . Colon cancer Neg Hx   . Esophageal cancer Neg Hx   . Rectal cancer Neg Hx   . Heart attack Neg Hx   . Stroke Neg Hx   . Tuberculosis Mother     x 2   Social History:  reports that she has never smoked. She has never used smokeless tobacco. She reports that she does not drink alcohol or use illicit drugs.  Allergies:  Allergies  Allergen Reactions  . Entex Other (See Comments)    unknown  . Sulfonamide Derivatives Other (See Comments)    unknown    Medications Prior to Admission  Medication Sig Dispense Refill  . ALPRAZolam (XANAX) 0.25 MG tablet Take 1 tablet (0.25 mg total) by mouth at bedtime as needed for anxiety. 60 tablet 0  . amLODipine (NORVASC) 5 MG tablet TAKE 1 TABLET BY MOUTH DAILY 30 tablet 5  . aspirin 81 MG tablet Take 81 mg by mouth at bedtime.     Marland Kitchen galantamine (RAZADYNE ER) 16 MG 24  hr capsule take 1 capsule by mouth once daily with BREAKFAST 30 capsule 3  . Ibuprofen 200 MG CAPS Take 1 capsule (200 mg total) by mouth daily as needed. (Patient taking differently: Take 200 mg by mouth daily as needed. For pain) 120 each 0  . imipramine (TOFRANIL) 50 MG tablet Take 1 tablets in evening (50mg ) (Patient taking differently: Take 50 mg by mouth every evening. Take 1 tablets in evening (50mg )) 90 tablet 3  . latanoprost (XALATAN) 0.005 % ophthalmic solution     . levothyroxine (SYNTHROID, LEVOTHROID) 88 MCG tablet TAKE 1 TABLET BY MOUTH DAILY BEFORE BREAKFAST 90 tablet 2  . memantine (NAMENDA XR) 28 MG CP24 24 hr capsule Take 1 capsule (28 mg total) by mouth daily. 30 capsule 6  . mirabegron ER (MYRBETRIQ) 25 MG TB24 tablet Take 25 mg by mouth daily.    . Multiple Vitamin (MULTIVITAMIN) tablet Take 1 tablet by mouth daily.    . Omega-3 Fatty Acids  (FISH OIL) 1000 MG CAPS Take 1,000 mg by mouth daily.    . rosuvastatin (CRESTOR) 20 MG tablet Take 1 tablet (20 mg total) by mouth once a week. 15 tablet 3  . sertraline (ZOLOFT) 50 MG tablet 50 mg. Take one and half tab daily    . traZODone (DESYREL) 50 MG tablet TAKE 1 TABLET BY MOUTH AT BEDTIME AS NEEDED FOR SLEEP 30 tablet 5  . Bismuth Subsalicylate (KAOPECTATE PO) Take by mouth.    . polyethylene glycol (MIRALAX / GLYCOLAX) packet Take 17 g by mouth daily.      No results found for this or any previous visit (from the past 48 hour(s)).  No results found.   Pertinent items are noted in HPI.  Blood pressure 145/78, pulse 71, temperature 97.6 F (36.4 C), temperature source Oral, resp. rate 20, height 5\' 2"  (1.575 m), weight 53.978 kg (119 lb), SpO2 100 %.  General appearance: alert, cooperative and appears stated age Head: Normocephalic, without obvious abnormality, atraumatic Neck: no adenopathy, no carotid bruit and no JVD Resp: clear to auscultation bilaterally Cardio: regular rate and rhythm, S1, S2 normal, no murmur, click, rub or gallop GI: soft, non-tender; bowel sounds normal; no masses,  no organomegaly Extremities: mass left index Pulses: 2+ and symmetric Skin: Skin color, texture, turgor normal. No rashes or lesions Neurologic: Grossly normal Incision/Wound: na  Assessment/Plan 1. Pain ORTHO XR HAND 2 VW BILATERAL AP, Lateral  2. Primary osteoarthritis of both first carpometacarpal joints  3. Mucoid cyst, joint  4. Osteoarthritis of finger, left  5. 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 We would not recommend doing anything to the distal radius malunion, to the boutonniere deformity of the finger, to the [facility] joints of either hand and that she is not complaining  of these. They are aware that there is no guarantee with the surgery, possibility of infection, recurrence of injury to arteries, nerves, tendons, incomplete relief of symptom, dystrophy, the possibility of recurrence of the cyst, despite rotation of the flap, possibility of flap loss. She is scheduled for excision of mucoid cyst and debridement of distal metatarsophalangeal joint, left index finger as an outpatient with dorsal rotation flap.    Beckett Hickmon R 08/04/2015, 12:53 PM

## 2015-08-04 NOTE — Anesthesia Postprocedure Evaluation (Signed)
Anesthesia Post Note  Patient: Julia [name]  Procedure(s) Performed: Procedure(s) (LRB): EXCISION MUCOID CYST (N/A) DEBRIDEMENT DISTAL INTERPHALANGEAL LEFT INDEX FINGER (Left)  Patient location during evaluation: PACU Anesthesia Type: General Level of consciousness: awake and alert Pain management: pain level controlled Vital Signs Assessment: post-procedure vital signs reviewed and stable Respiratory status: spontaneous breathing, nonlabored ventilation and respiratory function stable Cardiovascular status: blood pressure returned to baseline and stable Postop Assessment: no signs of nausea or vomiting Anesthetic complications: no    Last Vitals:  Filed Vitals:   08/04/15 1500 08/04/15 1515  BP: 173/75 161/68  Pulse: 72 64  Temp:    Resp: 25 9    Last Pain:  Filed Vitals:   08/04/15 1518  PainSc: 0-No pain                 Keelon Zurn A

## 2015-08-04 NOTE — Anesthesia Preprocedure Evaluation (Addendum)
Anesthesia Evaluation  Patient identified by MRN, date of birth, ID band Patient awake    Reviewed: Allergy & Precautions, NPO status , Patient's Chart, lab work & pertinent test results  Airway Mallampati: I  TM Distance: >3 FB Neck ROM: Full    Dental  (+) Teeth Intact, Dental Advisory Given   Pulmonary    breath sounds clear to auscultation       Cardiovascular hypertension, Pt. on medications  Rhythm:Regular Rate:Normal     Neuro/Psych    GI/Hepatic GERD  Medicated,  Endo/Other    Renal/GU      Musculoskeletal   Abdominal   Peds  Hematology   Anesthesia Other Findings   Reproductive/Obstetrics                            Anesthesia Physical Anesthesia Plan  ASA: III  Anesthesia Plan: Bier Block   Post-op Pain Management:    Induction: Intravenous  Airway Management Planned: Simple Face Mask  Additional Equipment:   Intra-op Plan:   Post-operative Plan:   Informed Consent: I have reviewed the patients History and Physical, chart, labs and discussed the procedure including the risks, benefits and alternatives for the proposed anesthesia with the patient or authorized representative who has indicated his/her understanding and acceptance.   Dental advisory given  Plan Discussed with: CRNA, Anesthesiologist and Surgeon  Anesthesia Plan Comments:         Anesthesia Quick Evaluation

## 2015-08-04 NOTE — Op Note (Signed)
Dictation Number [id number]

## 2015-08-04 NOTE — Discharge Instructions (Addendum)

## 2015-08-04 NOTE — Brief Op Note (Signed)
08/04/2015  2:39 PM  PATIENT:  Julia [name]  81 y.o. female  PRE-OPERATIVE DIAGNOSIS:  Degenerative Joint disease interphalangeal left index finger  M67.40  POST-OPERATIVE DIAGNOSIS:  Degenerative Joint disease interphalangeal left index finger  M67.40  PROCEDURE:  Procedure(s): ROTATION FLAP (N/A) EXCISION MUCOID CYST (N/A) DEBRIDEMENT DISTAL INTERPHALANGEAL LEFT INDEX FINGER (Left)  SURGEON:  Surgeon(s) and Role:    * Daryll Brod, MD - Primary    * Leanora Cover, MD - Assisting  PHYSICIAN ASSISTANT:   ASSISTANTS: K Renise Gillies,MD   ANESTHESIA:   regional and local  EBL:     BLOOD ADMINISTERED:none  DRAINS: none   LOCAL MEDICATIONS USED:  BUPIVICAINE   SPECIMEN:  Excision  DISPOSITION OF SPECIMEN:  PATHOLOGY  COUNTS:  YES  TOURNIQUET:   Total Tourniquet Time Documented: Forearm (Left) - 27 minutes Total: Forearm (Left) - 27 minutes   DICTATION: .Other Dictation: Dictation Number [id number]  PLAN OF CARE: Admit to inpatient   PATIENT DISPOSITION:  PACU - hemodynamically stable.

## 2015-08-04 NOTE — Transfer of Care (Signed)
Immediate Anesthesia Transfer of Care Note  Patient: Julia [name]  Procedure(s) Performed: Procedure(s): EXCISION MUCOID CYST (N/A) DEBRIDEMENT DISTAL INTERPHALANGEAL LEFT INDEX FINGER (Left)  Patient Location: PACU  Anesthesia Type:Bier block  Level of Consciousness: alert  and patient cooperative  Airway & Oxygen Therapy: Patient Spontanous Breathing and Patient connected to face mask oxygen  Post-op Assessment: Report given to RN and Post -op Vital signs reviewed and stable  Post vital signs: Reviewed and stable  Last Vitals:  Filed Vitals:   08/04/15 1206 08/04/15 1454  BP: 145/78 168/99  Pulse: 71 76  Temp: 36.4 C   Resp: 20     Complications: No apparent anesthesia complications

## 2015-08-05 ENCOUNTER — Encounter (HOSPITAL_BASED_OUTPATIENT_CLINIC_OR_DEPARTMENT_OTHER): Payer: Self-pay | Admitting: Orthopedic Surgery

## 2015-08-05 NOTE — Addendum Note (Signed)
Addendum  created 08/05/15 1330 by Tawni Millers, CRNA   Modules edited: Charges VN

## 2015-08-06 NOTE — Addendum Note (Signed)
Addendum  created 08/06/15 0944 by Lorrene Reid, MD   Modules edited: Clinical Notes   Clinical Notes:  File: [id number]

## 2015-08-18 ENCOUNTER — Ambulatory Visit (INDEPENDENT_AMBULATORY_CARE_PROVIDER_SITE_OTHER): Payer: Medicare Other | Admitting: Neurology

## 2015-08-18 ENCOUNTER — Encounter: Payer: Self-pay | Admitting: Neurology

## 2015-08-18 VITALS — BP 132/64 | HR 97 | Ht 62.8 in | Wt 112.0 lb

## 2015-08-18 DIAGNOSIS — F418 Other specified anxiety disorders: Secondary | ICD-10-CM | POA: Diagnosis not present

## 2015-08-18 DIAGNOSIS — F0280 Dementia in other diseases classified elsewhere without behavioral disturbance: Secondary | ICD-10-CM | POA: Diagnosis not present

## 2015-08-18 DIAGNOSIS — F329 Major depressive disorder, single episode, unspecified: Secondary | ICD-10-CM

## 2015-08-18 DIAGNOSIS — G309 Alzheimer's disease, unspecified: Secondary | ICD-10-CM | POA: Diagnosis not present

## 2015-08-18 DIAGNOSIS — F419 Anxiety disorder, unspecified: Secondary | ICD-10-CM

## 2015-08-18 NOTE — Progress Notes (Signed)
NEUROLOGY FOLLOW UP OFFICE NOTE  NATRICE MANIFOLD [id number]  HISTORY OF PRESENT ILLNESS: Julia [name] is a 81 year old woman with HTN, hyperlipidemia, anxiety and thyroid disease, who returns for Alzheimer's dementia.  Records reviewed.  She is accompanied by her daughters who provide some history.    UPDATE: She currently takes galantamine ER 16mg  daily and memantine XR 28mg  daily.  She also takes Zoloft 50mg  daily.  She continues to live independently with her husband.  She tends to the her husband and the house work.  The house is kept clean.  Somebody comes to clean the house once a month.  Her daughter sets up her pillbox for the week.  Her daughter also helps with some of the finances.  Her mood is a lot better.  She is driving with stated restrictions.   HISTORY: Onset of symptoms began 7 years ago, starting with misplacing things such as her keys, and would often repeat questions. She is particularly having short-term memory problems. She frequently repeats questions and forgets why she walked into a room.  She does not forget faces.  She still pays the bills, but with the guidance of her daughter, because she sometimes forgets about some bills.  She has a long-standing history of anxiety, which got worse after the passing of her brother and later one of her daughters.  She occasionally is confused but doesn't really become disoriented when driving. She rarely drives and only during the day to the hair salon or occasionally [facility]. She is able to perform all her ADLs, such as bathing, dressing and feeding herself.  Her husband is disabled and she takes care of most of the house chores.  She does the laundry, washes the dishes and cleans the house.    MRI of the brain from 12/04/12 was reviewed and revealed mild to moderate atrophy with moderately extensive small vessel ischemic changes and small chronic lacunar infarct.  B12 level was 301.  She underwent formal driving evaluation  by occupational therapy on 12/16/14.  Driving was allowed with some restrictions were recommended.  Recommendations include no night driving, familiar routes only, only during non-rush hour, 10 mile radius from home, no interstate driving, speeds not exceeding 45 mph.  DMV has allowed her to drive with restrictions.   PAST MEDICAL HISTORY: Past Medical History  Diagnosis Date  . GERD (gastroesophageal reflux disease)   . Hypertension   . Hyperlipidemia   . Osteopenia   . History of colonic polyps   . Right forearm fracture 2011  . Anxiety   . Thyroid disease     hypothyroidism  . Concussion with no loss of consciousness 05/14/2008    Qualifier: Diagnosis of  By: Arnoldo Morale MD, Balinda Quails   . Dementia   . Hx of echocardiogram     Echo 4/16:  EF 55-60%, mild LVH  . Alzheimer's dementia   . Hypothyroidism     MEDICATIONS: Current Outpatient Prescriptions on File Prior to Visit  Medication Sig Dispense Refill  . ALPRAZolam (XANAX) 0.25 MG tablet Take 1 tablet (0.25 mg total) by mouth at bedtime as needed for anxiety. 60 tablet 0  . amLODipine (NORVASC) 5 MG tablet TAKE 1 TABLET BY MOUTH DAILY 30 tablet 5  . aspirin 81 MG tablet Take 81 mg by mouth at bedtime.     . Bismuth Subsalicylate (KAOPECTATE PO) Take by mouth.    . galantamine (RAZADYNE ER) 16 MG 24 hr capsule take 1 capsule by mouth once  daily with BREAKFAST 30 capsule 3  . Ibuprofen 200 MG CAPS Take 1 capsule (200 mg total) by mouth daily as needed. (Patient taking differently: Take 200 mg by mouth daily as needed. For pain) 120 each 0  . imipramine (TOFRANIL) 50 MG tablet Take 1 tablets in evening (50mg ) (Patient taking differently: Take 50 mg by mouth every evening. Take 1 tablets in evening (50mg )) 90 tablet 3  . latanoprost (XALATAN) 0.005 % ophthalmic solution     . levothyroxine (SYNTHROID, LEVOTHROID) 88 MCG tablet TAKE 1 TABLET BY MOUTH DAILY BEFORE BREAKFAST 90 tablet 2  . memantine (NAMENDA XR) 28 MG CP24 24 hr capsule Take  1 capsule (28 mg total) by mouth daily. 30 capsule 6  . mirabegron ER (MYRBETRIQ) 25 MG TB24 tablet Take 25 mg by mouth daily.    . Multiple Vitamin (MULTIVITAMIN) tablet Take 1 tablet by mouth daily.    . Omega-3 Fatty Acids (FISH OIL) 1000 MG CAPS Take 1,000 mg by mouth daily.    . polyethylene glycol (MIRALAX / GLYCOLAX) packet Take 17 g by mouth daily.    . rosuvastatin (CRESTOR) 20 MG tablet Take 1 tablet (20 mg total) by mouth once a week. 15 tablet 3  . sertraline (ZOLOFT) 50 MG tablet 50 mg. Take one and half tab daily    . traZODone (DESYREL) 50 MG tablet TAKE 1 TABLET BY MOUTH AT BEDTIME AS NEEDED FOR SLEEP 30 tablet 5   No current facility-administered medications on file prior to visit.    ALLERGIES: Allergies  Allergen Reactions  . Entex Other (See Comments)    unknown  . Sulfonamide Derivatives Other (See Comments)    unknown    FAMILY HISTORY: Family History  Problem Relation Age of Onset  . Lung cancer Father   . Hypertension Mother   . COPD Mother   . Stomach cancer Father   . Heart disease Brother   . Alzheimer's disease Maternal Aunt   . Drug abuse Maternal Uncle   . Cancer Maternal Uncle   . Colon cancer Neg Hx   . Esophageal cancer Neg Hx   . Rectal cancer Neg Hx   . Heart attack Neg Hx   . Stroke Neg Hx   . Tuberculosis Mother     x 2    SOCIAL HISTORY: Social History   Social History  . Marital Status: Married    Spouse Name: N/A  . Number of Children: N/A  . Years of Education: N/A   Occupational History  . retired    Social History Main Topics  . Smoking status: Never Smoker   . Smokeless tobacco: Never Used  . Alcohol Use: No  . Drug Use: No  . Sexual Activity: No   Other Topics Concern  . Not on file   Social History Narrative   Married to husband Ardyth Gal for 34 years and lives in [place]. 2 daughters live in [place], 1 son in [place] and 1 in [place].       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        Hobbies: tv, caring for cat (very important to her), change is difficult even with type of tv      Jake Samples (daughter) Chauncey Reading [phone]   Full Code    REVIEW OF SYSTEMS: Constitutional: No fevers, chills, or sweats, no generalized fatigue, change in appetite Eyes: No visual changes, double vision, eye pain Ear, nose and throat: No hearing loss, ear pain, nasal  congestion, sore throat Cardiovascular: No chest pain, palpitations Respiratory:  No shortness of breath at rest or with exertion, wheezes GastrointestinaI: No nausea, vomiting, diarrhea, abdominal pain, fecal incontinence Genitourinary:  No dysuria, urinary retention or frequency Musculoskeletal:  No neck pain, back pain Integumentary: No rash, pruritus, skin lesions Neurological: as above Psychiatric: No depression, insomnia, anxiety Endocrine: No palpitations, fatigue, diaphoresis, mood swings, change in appetite, change in weight, increased thirst Hematologic/Lymphatic:  No anemia, purpura, petechiae. Allergic/Immunologic: no itchy/runny eyes, nasal congestion, recent allergic reactions, rashes  PHYSICAL EXAM: Filed Vitals:   08/18/15 1130  BP: 132/64  Pulse: 97   General: No acute distress.  Patient appears well-groomed.  normal body habitus. Head:  Normocephalic/atraumatic Eyes:  Fundoscopic exam unremarkable without vessel changes, exudates, hemorrhages or papilledema. Neck: supple, no paraspinal tenderness, full range of motion Heart:  Regular rate and rhythm Lungs:  Clear to auscultation bilaterally Back: No paraspinal tenderness Neurological Exam: alert and oriented to person, month, year and city. Attention span and concentration intact, delayed recall poor, remote memory intact, fund of knowledge intact.  Speech fluent and not dysarthric, language intact.   Montreal Cognitive Assessment  08/18/2015 02/10/2015 03/07/2014  Visuospatial/ Executive (0/5) 3 4 1   Naming (0/3) 2 1 3   Attention: Read list of  digits (0/2) 2 2 2   Attention: Read list of letters (0/1) 1 1 0  Attention: Serial 7 subtraction starting at 100 (0/3) 0 0 0  Language: Repeat phrase (0/2) 2 1 1   Language : Fluency (0/1) 0 1 0  Abstraction (0/2) 1 1 1   Delayed Recall (0/5) 0 0 0  Orientation (0/6) 3 5 5   Total 14 16 13   Adjusted Score (based on education) 15 17 14    CN II-XII intact. Fundoscopic exam unremarkable without vessel changes, exudates, hemorrhages or papilledema.  Bulk and tone normal, muscle strength 5/5 throughout.  Sensation to light touch intact.  Deep tendon reflexes 2+ throughout.  Finger to nose and heel to shin testing intact.  Gait normal, Romberg negative.  IMPRESSION: Alzheimer's disease, stable Depression and anxiety stable  PLAN: 1.  Continue galantamine ER 16mg  daily and memantine XR 28mg  daily 2.  Continue sertraline 50mg  daily 3.  I recommended that  somebody occasionally accompany her when she drives to assess for any changes that may need to be re-evaluated 4.  Follow up in 9 months  27 minutes spent face to face with patient, over 50% spent discussing management.  Metta Clines, DO  CC:  Dorothyann Peng, NP

## 2015-08-18 NOTE — Patient Instructions (Signed)
1.  Continue galantamine ER 16mg  daily and memantine XR 28mg  daily 2.  Continue sertraline 50mg  daily 3.  I would recommend somebody occasionally accompany you when you drive to assess for any changes that may need to be re-evaluated 4.  Follow up in 9 months

## 2015-08-19 ENCOUNTER — Other Ambulatory Visit: Payer: Self-pay | Admitting: *Deleted

## 2015-08-26 ENCOUNTER — Other Ambulatory Visit: Payer: Self-pay

## 2015-08-26 ENCOUNTER — Telehealth: Payer: Self-pay | Admitting: Adult Health

## 2015-08-26 NOTE — Telephone Encounter (Signed)
Ok to refill 

## 2015-08-26 NOTE — Telephone Encounter (Signed)
Pt request refill of the following: imipramine (TOFRANIL) 50 MG tablet  Daughter said she been trying to get this med for 2 weeks    Phamacy:  [facility]

## 2015-08-27 ENCOUNTER — Other Ambulatory Visit: Payer: Self-pay

## 2015-08-27 MED ORDER — IMIPRAMINE HCL 50 MG PO TABS: ORAL_TABLET | ORAL | Status: DC

## 2015-08-27 NOTE — Telephone Encounter (Signed)
Rx sent in as directed.  

## 2015-08-27 NOTE — Telephone Encounter (Signed)
OK To refill?

## 2015-09-14 ENCOUNTER — Other Ambulatory Visit: Payer: Self-pay | Admitting: Neurology

## 2015-09-14 ENCOUNTER — Other Ambulatory Visit: Payer: Self-pay | Admitting: Adult Health

## 2015-09-14 MED ORDER — AMLODIPINE BESYLATE 5 MG PO TABS: 5.0000 mg | ORAL_TABLET | Freq: Every day | ORAL | Status: DC

## 2015-09-14 NOTE — Telephone Encounter (Signed)
Last OV: 08/18/15 Next OV: 05/17/16 Continue galantamine ER 16mg  daily

## 2015-10-17 ENCOUNTER — Ambulatory Visit (INDEPENDENT_AMBULATORY_CARE_PROVIDER_SITE_OTHER): Payer: Medicare Other | Admitting: Family Medicine

## 2015-10-17 VITALS — BP 152/70 | HR 92 | Temp 98.2°F | Resp 16 | Ht 62.75 in | Wt 112.8 lb

## 2015-10-17 DIAGNOSIS — L280 Lichen simplex chronicus: Secondary | ICD-10-CM | POA: Diagnosis not present

## 2015-10-17 MED ORDER — TRIAMCINOLONE ACETONIDE 0.1 % EX CREA: 1.0000 "application " | TOPICAL_CREAM | Freq: Two times a day (BID) | CUTANEOUS | Status: DC

## 2015-10-17 MED ORDER — HYDROXYZINE HCL 25 MG PO TABS: 12.5000 mg | ORAL_TABLET | Freq: Three times a day (TID) | ORAL | Status: DC | PRN

## 2015-10-17 NOTE — Progress Notes (Signed)
81 yo woman under great stress for 3-4 years after being told she could not drive any more by her PCP.  Also, her daughter has taken her adopted pet away from her and taken her back to [place] today.  She had the dog about a month.  She still lives with her husband.  She went to Dr. Delman Cheadle her ophthalmologist to get her license back.  Objective:  BP 152/70 mmHg  Pulse 92  Temp(Src) 98.2 F (36.8 C) (Oral)  Resp 16  Ht 5' 2.75" (1.594 m)  Wt 112 lb 12.8 oz (51.166 kg)  BMI 20.14 kg/m2  SpO2 100% Patient very tearful  Excoriated right scapula.  5 other excoriated papules:  Right flank, right leg(3), left calf.  No distinct pattern.  Assessment:  Neurodermatitis. Stress  Plan: atarax 10 mg tid prn and triamcinolone cream 0.1%  Follow up with PCP  Robyn Haber, MD

## 2015-10-17 NOTE — Patient Instructions (Signed)
Use the cream 3 times a day and you can take the anti-itch pill up to 3 times a day.

## 2015-10-21 ENCOUNTER — Other Ambulatory Visit: Payer: Self-pay | Admitting: Neurology

## 2015-10-22 NOTE — Telephone Encounter (Signed)
08/18/15 05/17/16 

## 2015-11-17 ENCOUNTER — Other Ambulatory Visit: Payer: Self-pay | Admitting: Orthopedic Surgery

## 2015-11-18 ENCOUNTER — Encounter (HOSPITAL_BASED_OUTPATIENT_CLINIC_OR_DEPARTMENT_OTHER): Payer: Self-pay | Admitting: *Deleted

## 2015-11-18 ENCOUNTER — Other Ambulatory Visit: Payer: Self-pay

## 2015-11-18 NOTE — Progress Notes (Signed)
Pre-op phone call completed by patient's daughter Idelle Leech who is her medical POA. Patient has memory problems but lives with husband in home and does own ADL's. Daughter administers all meds and does all appointments for patient. Husband will come with pt dos.

## 2015-11-19 ENCOUNTER — Other Ambulatory Visit: Payer: Self-pay | Admitting: Adult Health

## 2015-11-19 MED ORDER — TRAZODONE HCL 50 MG PO TABS: 50.0000 mg | ORAL_TABLET | Freq: Every evening | ORAL | Status: DC | PRN

## 2015-11-19 NOTE — Telephone Encounter (Signed)
Daughter states she is going out of town this weekend and she handles all pt's meds.  Would like this to be refilled tomorrow if possible.

## 2015-11-19 NOTE — Telephone Encounter (Signed)
Trazadone sent to pharmacy 

## 2015-11-23 NOTE — Telephone Encounter (Signed)
Ok thanks 

## 2015-11-24 ENCOUNTER — Ambulatory Visit (HOSPITAL_BASED_OUTPATIENT_CLINIC_OR_DEPARTMENT_OTHER)
Admission: RE | Admit: 2015-11-24 | Discharge: 2015-11-24 | Disposition: A | Discharge status: Home / Self Care | Payer: Medicare Other | Source: Ambulatory Visit | Attending: Orthopedic Surgery | Admitting: Orthopedic Surgery

## 2015-11-24 ENCOUNTER — Other Ambulatory Visit: Payer: Self-pay | Admitting: Neurology

## 2015-11-24 ENCOUNTER — Encounter (HOSPITAL_BASED_OUTPATIENT_CLINIC_OR_DEPARTMENT_OTHER)
Admission: RE | Disposition: A | Discharge status: Home / Self Care | Payer: Self-pay | Source: Ambulatory Visit | Attending: Orthopedic Surgery

## 2015-11-24 ENCOUNTER — Encounter (HOSPITAL_BASED_OUTPATIENT_CLINIC_OR_DEPARTMENT_OTHER): Payer: Self-pay | Admitting: Orthopedic Surgery

## 2015-11-24 ENCOUNTER — Ambulatory Visit (HOSPITAL_BASED_OUTPATIENT_CLINIC_OR_DEPARTMENT_OTHER): Payer: Medicare Other | Admitting: Anesthesiology

## 2015-11-24 DIAGNOSIS — Z7982 Long term (current) use of aspirin: Secondary | ICD-10-CM | POA: Diagnosis not present

## 2015-11-24 DIAGNOSIS — F0280 Dementia in other diseases classified elsewhere without behavioral disturbance: Secondary | ICD-10-CM | POA: Insufficient documentation

## 2015-11-24 DIAGNOSIS — M67442 Ganglion, left hand: Secondary | ICD-10-CM | POA: Diagnosis not present

## 2015-11-24 DIAGNOSIS — E785 Hyperlipidemia, unspecified: Secondary | ICD-10-CM | POA: Insufficient documentation

## 2015-11-24 DIAGNOSIS — E039 Hypothyroidism, unspecified: Secondary | ICD-10-CM | POA: Insufficient documentation

## 2015-11-24 DIAGNOSIS — D4989 Neoplasm of unspecified behavior of other specified sites: Secondary | ICD-10-CM | POA: Diagnosis present

## 2015-11-24 DIAGNOSIS — F419 Anxiety disorder, unspecified: Secondary | ICD-10-CM | POA: Insufficient documentation

## 2015-11-24 DIAGNOSIS — I10 Essential (primary) hypertension: Secondary | ICD-10-CM | POA: Diagnosis not present

## 2015-11-24 DIAGNOSIS — Z79899 Other long term (current) drug therapy: Secondary | ICD-10-CM | POA: Diagnosis not present

## 2015-11-24 DIAGNOSIS — G309 Alzheimer's disease, unspecified: Secondary | ICD-10-CM | POA: Insufficient documentation

## 2015-11-24 HISTORY — PX: MASS EXCISION: SHX2000

## 2015-11-24 SURGERY — EXCISION MASS
Anesthesia: Monitor Anesthesia Care | Site: Finger | Laterality: Left

## 2015-11-24 MED ORDER — MIDAZOLAM HCL 2 MG/2ML IJ SOLN: 1.0000 mg | INTRAMUSCULAR | Status: DC | PRN

## 2015-11-24 MED ORDER — GLYCOPYRROLATE 0.2 MG/ML IJ SOLN: 0.2000 mg | Freq: Once | INTRAMUSCULAR | Status: DC | PRN

## 2015-11-24 MED ORDER — SCOPOLAMINE 1 MG/3DAYS TD PT72: 1.0000 | MEDICATED_PATCH | Freq: Once | TRANSDERMAL | Status: DC | PRN

## 2015-11-24 MED ORDER — OXYCODONE HCL 5 MG PO TABS: 5.0000 mg | ORAL_TABLET | Freq: Once | ORAL | Status: DC | PRN

## 2015-11-24 MED ORDER — BUPIVACAINE HCL (PF) 0.25 % IJ SOLN
INTRAMUSCULAR | Status: DC | PRN
  Administered 2015-11-24: 6 mL

## 2015-11-24 MED ORDER — ONDANSETRON HCL 4 MG/2ML IJ SOLN
INTRAMUSCULAR | Status: AC
  Filled 2015-11-24: qty 2

## 2015-11-24 MED ORDER — OXYCODONE HCL 5 MG/5ML PO SOLN: 5.0000 mg | Freq: Once | ORAL | Status: DC | PRN

## 2015-11-24 MED ORDER — FENTANYL CITRATE (PF) 100 MCG/2ML IJ SOLN
50.0000 ug | INTRAMUSCULAR | Status: DC | PRN
  Administered 2015-11-24 (×2): 50 ug via INTRAVENOUS

## 2015-11-24 MED ORDER — FENTANYL CITRATE (PF) 100 MCG/2ML IJ SOLN: 25.0000 ug | INTRAMUSCULAR | Status: DC | PRN

## 2015-11-24 MED ORDER — CEFAZOLIN SODIUM-DEXTROSE 2-4 GM/100ML-% IV SOLN
INTRAVENOUS | Status: AC
  Filled 2015-11-24: qty 100

## 2015-11-24 MED ORDER — ONDANSETRON HCL 4 MG/2ML IJ SOLN: 4.0000 mg | Freq: Four times a day (QID) | INTRAMUSCULAR | Status: DC | PRN

## 2015-11-24 MED ORDER — PROPOFOL 500 MG/50ML IV EMUL
INTRAVENOUS | Status: DC | PRN
  Administered 2015-11-24: 50 ug/kg/min via INTRAVENOUS

## 2015-11-24 MED ORDER — HYDROCODONE-ACETAMINOPHEN 5-325 MG PO TABS: 1.0000 | ORAL_TABLET | Freq: Four times a day (QID) | ORAL | Status: DC | PRN

## 2015-11-24 MED ORDER — CEFAZOLIN SODIUM-DEXTROSE 2-4 GM/100ML-% IV SOLN
2.0000 g | INTRAVENOUS | Status: AC
  Administered 2015-11-24: 2 g via INTRAVENOUS

## 2015-11-24 MED ORDER — ONDANSETRON HCL 4 MG/2ML IJ SOLN
INTRAMUSCULAR | Status: DC | PRN
  Administered 2015-11-24: 4 mg via INTRAVENOUS

## 2015-11-24 MED ORDER — CHLORHEXIDINE GLUCONATE 4 % EX LIQD: 60.0000 mL | Freq: Once | CUTANEOUS | Status: DC

## 2015-11-24 MED ORDER — FENTANYL CITRATE (PF) 100 MCG/2ML IJ SOLN
INTRAMUSCULAR | Status: AC
  Filled 2015-11-24: qty 2

## 2015-11-24 MED ORDER — LACTATED RINGERS IV SOLN
INTRAVENOUS | Status: DC
  Administered 2015-11-24: 11:00:00 via INTRAVENOUS

## 2015-11-24 SURGICAL SUPPLY — 51 items
BANDAGE COBAN STERILE 2 (GAUZE/BANDAGES/DRESSINGS) IMPLANT
BLADE SURG 15 STRL LF DISP TIS (BLADE) ×2 IMPLANT
BLADE SURG 15 STRL SS (BLADE) ×4
BNDG CMPR 9X4 STRL LF SNTH (GAUZE/BANDAGES/DRESSINGS)
BNDG COHESIVE 1X5 TAN STRL LF (GAUZE/BANDAGES/DRESSINGS) ×3 IMPLANT
BNDG COHESIVE 3X5 TAN STRL LF (GAUZE/BANDAGES/DRESSINGS) ×4 IMPLANT
BNDG ESMARK 4X9 LF (GAUZE/BANDAGES/DRESSINGS) IMPLANT
BNDG GAUZE ELAST 4 BULKY (GAUZE/BANDAGES/DRESSINGS) ×4 IMPLANT
CHLORAPREP W/TINT 26ML (MISCELLANEOUS) ×4 IMPLANT
CORDS BIPOLAR (ELECTRODE) ×4 IMPLANT
COVER BACK TABLE 60X90IN (DRAPES) ×4 IMPLANT
COVER MAYO STAND STRL (DRAPES) ×4 IMPLANT
CUFF TOURNIQUET SINGLE 18IN (TOURNIQUET CUFF) ×4 IMPLANT
DECANTER SPIKE VIAL GLASS SM (MISCELLANEOUS) ×3 IMPLANT
DRAIN PENROSE 1/2X12 LTX STRL (WOUND CARE) IMPLANT
DRAPE EXTREMITY T 121X128X90 (DRAPE) ×4 IMPLANT
DRAPE SURG 17X23 STRL (DRAPES) ×4 IMPLANT
GAUZE SPONGE 4X4 12PLY STRL (GAUZE/BANDAGES/DRESSINGS) ×4 IMPLANT
GAUZE XEROFORM 1X8 LF (GAUZE/BANDAGES/DRESSINGS) ×4 IMPLANT
GLOVE BIOGEL PI IND STRL 7.0 (GLOVE) ×2 IMPLANT
GLOVE BIOGEL PI IND STRL 8.5 (GLOVE) ×2 IMPLANT
GLOVE BIOGEL PI INDICATOR 7.0 (GLOVE) ×4
GLOVE BIOGEL PI INDICATOR 8.5 (GLOVE) ×2
GLOVE EXAM NITRILE EXT CUFF MD (GLOVE) ×3 IMPLANT
GLOVE SURG ORTHO 8.0 STRL STRW (GLOVE) ×4 IMPLANT
GLOVE SURG SS PI 7.0 STRL IVOR (GLOVE) ×6 IMPLANT
GOWN STRL REUS W/ TWL LRG LVL3 (GOWN DISPOSABLE) ×2 IMPLANT
GOWN STRL REUS W/TWL LRG LVL3 (GOWN DISPOSABLE) ×4
GOWN STRL REUS W/TWL XL LVL3 (GOWN DISPOSABLE) ×4 IMPLANT
NDL PRECISIONGLIDE 27X1.5 (NEEDLE) IMPLANT
NDL SAFETY ECLIPSE 18X1.5 (NEEDLE) IMPLANT
NEEDLE HYPO 18GX1.5 SHARP (NEEDLE)
NEEDLE PRECISIONGLIDE 27X1.5 (NEEDLE) ×4 IMPLANT
NS IRRIG 1000ML POUR BTL (IV SOLUTION) ×4 IMPLANT
PACK BASIN DAY SURGERY FS (CUSTOM PROCEDURE TRAY) ×4 IMPLANT
PAD CAST 3X4 CTTN HI CHSV (CAST SUPPLIES) ×2 IMPLANT
PADDING CAST ABS 4INX4YD NS (CAST SUPPLIES) ×2
PADDING CAST ABS COTTON 4X4 ST (CAST SUPPLIES) ×2 IMPLANT
PADDING CAST COTTON 3X4 STRL (CAST SUPPLIES) ×4
SPLINT FINGER 4.25 BULB 911906 (SOFTGOODS) ×3 IMPLANT
SPLINT PLASTER CAST XFAST 3X15 (CAST SUPPLIES) IMPLANT
SPLINT PLASTER XTRA FASTSET 3X (CAST SUPPLIES)
SPONGE GAUZE 4X4 12PLY STER LF (GAUZE/BANDAGES/DRESSINGS) ×3 IMPLANT
STOCKINETTE 4X48 STRL (DRAPES) ×4 IMPLANT
SUT ETHILON 4 0 PS 2 18 (SUTURE) ×4 IMPLANT
SUT VIC AB 4-0 P2 18 (SUTURE) IMPLANT
SUT VICRYL 4-0 PS2 18IN ABS (SUTURE) IMPLANT
SYR BULB 3OZ (MISCELLANEOUS) ×4 IMPLANT
SYR CONTROL 10ML LL (SYRINGE) IMPLANT
TOWEL OR 17X24 6PK STRL BLUE (TOWEL DISPOSABLE) ×4 IMPLANT
UNDERPAD 30X30 (UNDERPADS AND DIAPERS) ×4 IMPLANT

## 2015-11-24 NOTE — Transfer of Care (Signed)
Immediate Anesthesia Transfer of Care Note  Patient: Julia [name]  Procedure(s) Performed: Procedure(s): EXCISION RECURRENT  MUCOID CYST LEFT INDEX  FINGER (Left)  Patient Location: PACU  Anesthesia Type:Bier block  Level of Consciousness: awake and patient cooperative  Airway & Oxygen Therapy: Patient Spontanous Breathing and Patient connected to face mask oxygen  Post-op Assessment: Report given to RN and Post -op Vital signs reviewed and stable  Post vital signs: Reviewed and stable  Last Vitals:  Filed Vitals:   11/24/15 1022  BP: 145/80  Pulse: 77  Temp: 36.6 C  Resp: 18    Last Pain: There were no vitals filed for this visit.    Patients Stated Pain Goal: 0 ([id number])  Complications: No apparent anesthesia complications

## 2015-11-24 NOTE — H&P (Signed)
  Julia [name] is an 81 y.o. female.   Chief Complaint: recurrent mucoid tumor left index HPI: Julia [name] is a 81 yo female with DJD of the fingers. She underwent mucoid cyst excision 4 months ago. Unfortunately the cyst has recurred. She is requesting re excision and debridment of thwe index figer.  Past Medical History  Diagnosis Date  . GERD (gastroesophageal reflux disease)   . Hypertension   . Hyperlipidemia   . Osteopenia   . History of colonic polyps   . Right forearm fracture 2011  . Anxiety   . Thyroid disease     hypothyroidism  . Concussion with no loss of consciousness 05/14/2008    Qualifier: Diagnosis of  By: Arnoldo Morale MD, Balinda Quails   . Dementia   . Hx of echocardiogram     Echo 4/16:  EF 55-60%, mild LVH  . Alzheimer's dementia   . Hypothyroidism     Past Surgical History  Procedure Laterality Date  . Rotator cuff repair      right shoulder  . Bladder tack    . Tonsillectomy    . Bilateral salpingoophorectomy    . Surgical repair to fx left wrist    . Abdominal hysterectomy  2009  . Mass excision N/A 08/04/2015    Procedure: EXCISION MUCOID CYST;  Surgeon: Daryll Brod, MD;  Location: [facility];  Service: Orthopedics;  Laterality: N/A;  . Incision and drainage of wound Left 08/04/2015    Procedure: DEBRIDEMENT DISTAL INTERPHALANGEAL LEFT INDEX FINGER;  Surgeon: Daryll Brod, MD;  Location: [facility];  Service: Orthopedics;  Laterality: Left;    Family History  Problem Relation Age of Onset  . Lung cancer Father   . Hypertension Mother   . COPD Mother   . Stomach cancer Father   . Heart disease Brother   . Alzheimer's disease Maternal Aunt   . Drug abuse Maternal Uncle   . Cancer Maternal Uncle   . Colon cancer Neg Hx   . Esophageal cancer Neg Hx   . Rectal cancer Neg Hx   . Heart attack Neg Hx   . Stroke Neg Hx   . Tuberculosis Mother     x 2   Social History:  reports that she has never smoked. She has never used  smokeless tobacco. She reports that she does not drink alcohol or use illicit drugs.  Allergies:  Allergies  Allergen Reactions  . Entex Other (See Comments)    unknown  . Sulfonamide Derivatives Other (See Comments)    unknown    No prescriptions prior to admission    No results found for this or any previous visit (from the past 48 hour(s)).  No results found.   Pertinent items are noted in HPI.  Height 5\' 2"  (1.575 m), weight 50.803 kg (112 lb).  General appearance: alert, cooperative and appears stated age Head: Normocephalic, without obvious abnormality Neck: no JVD Resp: clear to auscultation bilaterally Cardio: regular rate and rhythm, S1, S2 normal, no murmur, click, rub or gallop GI: soft, non-tender; bowel sounds normal; no masses,  no organomegaly Extremities: cyst left index finger Pulses: 2+ and symmetric Skin: Skin color, texture, turgor normal. No rashes or lesions Neurologic: Grossly normal Incision/Wound: na  Assessment/Plan Dx; Recurrent mucoid tumor left index finger Plan: excision cyst with debridement of the DIP joint and rotation of flap  Lindora Alviar R 11/24/2015, 9:14 AM

## 2015-11-24 NOTE — Brief Op Note (Signed)
11/24/2015  11:41 AM  PATIENT:  Noland Fordyce Wellman  81 y.o. female  PRE-OPERATIVE DIAGNOSIS:  LEFT INDEX RECURRENT MUCOID CYST  POST-OPERATIVE DIAGNOSIS:  LEFT INDEX RECURRENT MUCOID CYST  PROCEDURE:  Procedure(s): EXCISION RECURRENT  MUCOID CYST LEFT INDEX  FINGER (Left)  SURGEON:  Surgeon(s) and Role:    * Daryll Brod, MD - Primary  PHYSICIAN ASSISTANT:   ASSISTANTS: none   ANESTHESIA:   local and regional  EBL:     BLOOD ADMINISTERED:none  DRAINS: none   LOCAL MEDICATIONS USED:  BUPIVICAINE   SPECIMEN:  Excision  DISPOSITION OF SPECIMEN:  PATHOLOGY  COUNTS:  YES  TOURNIQUET:   Total Tourniquet Time Documented: Forearm (Left) - 26 minutes Total: Forearm (Left) - 26 minutes   DICTATION: .Other Dictation: Dictation Number [id number]  PLAN OF CARE: Discharge to home after PACU  PATIENT DISPOSITION:  PACU - hemodynamically stable.

## 2015-11-24 NOTE — Discharge Instructions (Addendum)

## 2015-11-24 NOTE — Anesthesia Postprocedure Evaluation (Signed)
Anesthesia Post Note  Patient: Julia [name]  Procedure(s) Performed: Procedure(s) (LRB): EXCISION RECURRENT  MUCOID CYST LEFT INDEX  FINGER (Left)  Patient location during evaluation: PACU Anesthesia Type: MAC and Bier Block Level of consciousness: awake and alert Pain management: pain level controlled Vital Signs Assessment: post-procedure vital signs reviewed and stable Respiratory status: spontaneous breathing, nonlabored ventilation, respiratory function stable and patient connected to nasal cannula oxygen Cardiovascular status: stable and blood pressure returned to baseline Anesthetic complications: no    Last Vitals:  Filed Vitals:   11/24/15 1215 11/24/15 1239  BP: 151/68 163/70  Pulse: 64 64  Temp:  36.9 C  Resp: 19 18    Last Pain:  Filed Vitals:   11/24/15 1240  PainSc: 0-No pain                 Taryn Nave S

## 2015-11-24 NOTE — Anesthesia Preprocedure Evaluation (Signed)
Anesthesia Evaluation  Patient identified by MRN, date of birth, ID band Patient awake    Reviewed: Allergy & Precautions, NPO status , Patient's Chart, lab work & pertinent test results  Airway Mallampati: II   Neck ROM: full    Dental   Pulmonary neg pulmonary ROS,    breath sounds clear to auscultation       Cardiovascular hypertension,  Rhythm:regular Rate:Normal     Neuro/Psych Anxiety  Neuromuscular disease    GI/Hepatic GERD  ,  Endo/Other  Hypothyroidism   Renal/GU      Musculoskeletal   Abdominal   Peds  Hematology   Anesthesia Other Findings   Reproductive/Obstetrics                             Anesthesia Physical Anesthesia Plan  ASA: III  Anesthesia Plan: MAC and Bier Block   Post-op Pain Management:    Induction: Intravenous  Airway Management Planned: Simple Face Mask  Additional Equipment:   Intra-op Plan:   Post-operative Plan:   Informed Consent: I have reviewed the patients History and Physical, chart, labs and discussed the procedure including the risks, benefits and alternatives for the proposed anesthesia with the patient or authorized representative who has indicated his/her understanding and acceptance.     Plan Discussed with: CRNA, Anesthesiologist and Surgeon  Anesthesia Plan Comments:         Anesthesia Quick Evaluation

## 2015-11-24 NOTE — Op Note (Signed)
Dictation Number [id number]

## 2015-11-25 ENCOUNTER — Encounter (HOSPITAL_BASED_OUTPATIENT_CLINIC_OR_DEPARTMENT_OTHER): Payer: Self-pay | Admitting: Orthopedic Surgery

## 2015-11-25 NOTE — Op Note (Signed)
[name], IACCARINO NO.:  [id number]  MEDICAL RECORD NO.:  [id number]  LOCATION:                                 FACILITY:[facility]  PHYSICIAN:  Daryll Brod, M.D.            DATE OF BIRTH:  DATE OF PROCEDURE:  11/24/2015 DATE OF DISCHARGE:                              OPERATIVE REPORT   PREOPERATIVE DIAGNOSIS:  Recurrent mucoid tumor, left index finger.  POSTOPERATIVE DIAGNOSIS:  Recurrent mucoid tumor, left index finger.  OPERATION:  Excision of mucoid cyst, rotation of dorsal flap with debridement of distal interphalangeal joint, left index finger.  SURGEON:  Daryll Brod, M.D.  ANESTHESIA:  Forearm-based IV regional with local infiltration, metacarpal block.  ANESTHESIOLOGIST:  Albertha Ghee, MD.  PLACE OF SURGERY:  [facility].  HISTORY:  The patient is a 81 year old female with a history of mucoid cyst, degenerative arthritis, distal interphalangeal joint, left index finger.  This has been excised approximately 4 months ago, has recurred and enlarged, it is become translucent through the skin.  She is admitted for excision with rotation of dorsal flap.  Pre, peri and postoperative course have been discussed along with risks and complications.  She is aware that there was no guarantee with the surgery; the possibility of infection; recurrence of injury to arteries, nerves, tendons; incomplete relief of symptoms and dystrophy.  In the preoperative area, the patient is seen, the extremity marked by both patient and surgeon, and antibiotic given.  PROCEDURE IN DETAIL:  The patient was brought to the operating room, where a forearm-based IV regional anesthetic was carried out without difficulty.  She was prepped using ChloraPrep, supine position, left arm free.  A 3-minute dry time was allowed.  Time-out taken, confirming the patient and procedure.  Metacarpal block was given 0.25% bupivacaine without epinephrine, approximately 8 mL was used.  A  curvilinear incision was made over the dorsal aspect of the index finger.  An elliptical excision of the skin was then performed, this was followed down to the joint, which was opened and debrided.  Extensor tendon was found to be intact.  The dorsal skin was then elevated back to the PIP joint.  The skin was cut so as to allow rotation of flap of the back cut on the radial aspect.  This allowed the flap to be rotated in closing the entire defect.  The wound was copiously irrigated with saline.  The wound was then closed with interrupted 4-0 nylon sutures.  A sterile compressive dressing and splint to the finger applied.  On deflation of the tourniquet, remaining fingers were pinked, she was taken to the recovery room for observation in satisfactory condition.  She will be discharged to home to return to the [facility] in 1 week, on Norco.          ______________________________ Daryll Brod, M.D.     GK/MEDQ  D:  11/24/2015  T:  11/25/2015  Job:  [id number]

## 2015-11-25 NOTE — Telephone Encounter (Signed)
When I went to sign refill for sertraline this warning came up about drug interactions:    Drug-Drug: sertraline and imipramine  Plasma concentrations and pharmacologic effects of Tricyclic Antidepressants may be increased by Sertraline.   Please advise.

## 2015-11-25 NOTE — Telephone Encounter (Signed)
08/18/15  05/17/16  Continue sertraline 50mg  daily

## 2015-12-04 NOTE — Op Note (Signed)
Surgery performed on 11/24/15 for recurrent mucoid cyst: the excision of the skin included the recurrent cyst in the specimen. This is noted in the pathology report.

## 2015-12-14 ENCOUNTER — Ambulatory Visit: Payer: Self-pay | Admitting: Adult Health

## 2015-12-15 ENCOUNTER — Ambulatory Visit (INDEPENDENT_AMBULATORY_CARE_PROVIDER_SITE_OTHER): Payer: Medicare Other | Admitting: Adult Health

## 2015-12-15 ENCOUNTER — Telehealth: Payer: Self-pay | Admitting: Adult Health

## 2015-12-15 ENCOUNTER — Encounter: Payer: Self-pay | Admitting: Adult Health

## 2015-12-15 VITALS — BP 124/78 | Temp 98.1°F | Ht 62.0 in | Wt 113.9 lb

## 2015-12-15 DIAGNOSIS — I10 Essential (primary) hypertension: Secondary | ICD-10-CM

## 2015-12-15 DIAGNOSIS — G309 Alzheimer's disease, unspecified: Secondary | ICD-10-CM

## 2015-12-15 DIAGNOSIS — F0280 Dementia in other diseases classified elsewhere without behavioral disturbance: Secondary | ICD-10-CM | POA: Diagnosis not present

## 2015-12-15 NOTE — Telephone Encounter (Signed)
Pt does not need any refills in medication. There is no other issues going on except her L Big toe (completely purple) want to make sure that there is not something going on with her circulation.  Please put everything in writing it there is any changes or if she has to be referred anywher and give to pts husband.   Daughter Jake Samples [phone]

## 2015-12-15 NOTE — Telephone Encounter (Signed)
Will route to Cory as FYI. 

## 2015-12-15 NOTE — Progress Notes (Signed)
Subjective:    Patient ID: Julia [name], female    DOB: May 06, 1935, 81 y.o.   MRN: [id number]  HPI 81 year old female who presents to the office today for six month follow up regarding hypertension and Alzheimers Dementia. Her husband is present at this visit.   Patient endorses feeling " very good" and has no complaints. Her husband endorses that his wife has been doing well at home and he has not noticed any change in cognition   She continues to live independently with her husband. He cooks and she helps with dishes and laundry. She has had a formal driving exam done at the [facility]. She is able to drive with restrictions of no night driving, no driving over 56 MPH, has to stay in a 10 mile radius and is not to drive on the highway. She denies getting lost while driving   She is followed by Dr. Tomi Likens for dementia and is currently on galantamine ER 16mg  daily and memantine XR 28mg  daily. She last saw Dr. Tomi Likens in March. She has a follow up appointment in December. Family reports that they have not seen any further decline.    Review of Systems  Constitutional: Negative.   Respiratory: Negative.   Cardiovascular: Negative.   Neurological: Negative.   Psychiatric/Behavioral: Positive for decreased concentration. Negative for sleep disturbance. The patient is nervous/anxious.   All other systems reviewed and are negative.  Past Medical History  Diagnosis Date  . GERD (gastroesophageal reflux disease)   . Hypertension   . Hyperlipidemia   . Osteopenia   . History of colonic polyps   . Right forearm fracture 2011  . Anxiety   . Thyroid disease     hypothyroidism  . Concussion with no loss of consciousness 05/14/2008    Qualifier: Diagnosis of  By: Arnoldo Morale MD, Balinda Quails   . Dementia   . Hx of echocardiogram     Echo 4/16:  EF 55-60%, mild LVH  . Alzheimer's dementia   . Hypothyroidism     Social History   Social History  . Marital Status: Married    Spouse Name: N/A  . Number  of Children: N/A  . Years of Education: N/A   Occupational History  . retired    Social History Main Topics  . Smoking status: Never Smoker   . Smokeless tobacco: Never Used  . Alcohol Use: No  . Drug Use: No  . Sexual Activity: No   Other Topics Concern  . Not on file   Social History Narrative   Married to husband Ardyth Gal for 68 years and lives in [place]. 2 daughters live in [place], 1 son in [place] and 1 in [place].       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       Hobbies: tv, caring for cat (very important to her), change is difficult even with type of tv      Jake Samples (daughter) Chauncey Reading [phone]   Full Code    Past Surgical History  Procedure Laterality Date  . Rotator cuff repair      right shoulder  . Bladder tack    . Tonsillectomy    . Bilateral salpingoophorectomy    . Surgical repair to fx left wrist    . Abdominal hysterectomy  2009  . Mass excision N/A 08/04/2015    Procedure: EXCISION MUCOID CYST;  Surgeon: Daryll Brod, MD;  Location: [facility];  Service: Orthopedics;  Laterality:  N/A;  . Incision and drainage of wound Left 08/04/2015    Procedure: DEBRIDEMENT DISTAL INTERPHALANGEAL LEFT INDEX FINGER;  Surgeon: Daryll Brod, MD;  Location: [facility];  Service: Orthopedics;  Laterality: Left;  Marland Kitchen Mass excision Left 11/24/2015    Procedure: EXCISION RECURRENT  MUCOID CYST LEFT INDEX  FINGER;  Surgeon: Daryll Brod, MD;  Location: [facility];  Service: Orthopedics;  Laterality: Left;    Family History  Problem Relation Age of Onset  . Lung cancer Father   . Hypertension Mother   . COPD Mother   . Stomach cancer Father   . Heart disease Brother   . Alzheimer's disease Maternal Aunt   . Drug abuse Maternal Uncle   . Cancer Maternal Uncle   . Colon cancer Neg Hx   . Esophageal cancer Neg Hx   . Rectal cancer Neg Hx   . Heart attack Neg Hx   . Stroke Neg Hx   . Tuberculosis Mother       x 2    Allergies  Allergen Reactions  . Entex Other (See Comments)    unknown  . Sulfonamide Derivatives Other (See Comments)    unknown    Current Outpatient Prescriptions on File Prior to Visit  Medication Sig Dispense Refill  . ALPRAZolam (XANAX) 0.25 MG tablet Take 1 tablet (0.25 mg total) by mouth at bedtime as needed for anxiety. 60 tablet 0  . amLODipine (NORVASC) 5 MG tablet Take 1 tablet (5 mg total) by mouth daily. 30 tablet 5  . aspirin 81 MG tablet Take 81 mg by mouth at bedtime.     . Bismuth Subsalicylate (KAOPECTATE PO) Take by mouth. Reported on 10/17/2015    . galantamine (RAZADYNE ER) 16 MG 24 hr capsule TAKE 1 CAPSULE BY MOUTH ONCE DAILY WITH BREAKFAST 30 capsule 8  . HYDROcodone-acetaminophen (NORCO) 5-325 MG tablet Take 1 tablet by mouth every 6 (six) hours as needed for moderate pain. 20 tablet 0  . hydrOXYzine (ATARAX/VISTARIL) 25 MG tablet Take 0.5-1 tablets (12.5-25 mg total) by mouth every 8 (eight) hours as needed for itching. 30 tablet 0  . Ibuprofen 200 MG CAPS Take 1 capsule (200 mg total) by mouth daily as needed. 120 each 0  . imipramine (TOFRANIL) 50 MG tablet Take 1 tablets in evening (50mg ) 90 tablet 3  . levothyroxine (SYNTHROID, LEVOTHROID) 88 MCG tablet TAKE 1 TABLET BY MOUTH DAILY BEFORE BREAKFAST 90 tablet 2  . mirabegron ER (MYRBETRIQ) 25 MG TB24 tablet Take 25 mg by mouth daily.    . Multiple Vitamin (MULTIVITAMIN) tablet Take 1 tablet by mouth daily.    Marland Kitchen NAMENDA XR 28 MG CP24 24 hr capsule take 1 capsule by mouth daily 30 capsule 6  . Omega-3 Fatty Acids (FISH OIL) 1000 MG CAPS Take 1,000 mg by mouth daily.    . polyethylene glycol (MIRALAX / GLYCOLAX) packet Take 17 g by mouth daily. Reported on 10/17/2015    . rosuvastatin (CRESTOR) 20 MG tablet Take 1 tablet (20 mg total) by mouth once a week. 15 tablet 3  . sertraline (ZOLOFT) 50 MG tablet Take 1 tablet (50 mg total) by mouth daily. 30 tablet 6  . traZODone (DESYREL) 50 MG tablet Take 1  tablet (50 mg total) by mouth at bedtime as needed. for sleep 30 tablet 5  . triamcinolone cream (KENALOG) 0.1 % Apply 1 application topically 2 (two) times daily. 30 g 0   No current facility-administered medications on file  prior to visit.    BP 124/78 mmHg  Temp(Src) 98.1 F (36.7 C) (Oral)  Ht 5\' 2"  (1.575 m)  Wt 113 lb 14.4 oz (51.665 kg)  BMI 20.83 kg/m2       Objective:   Physical Exam  Constitutional: She is oriented to person, place, and time. She appears well-developed and well-nourished. No distress.  HENT:  Head: Normocephalic and atraumatic.  Right Ear: External ear normal.  Left Ear: External ear normal.  Nose: Nose normal.  Mouth/Throat: Oropharynx is clear and moist. No oropharyngeal exudate.  Eyes: Conjunctivae and EOM are normal. Pupils are equal, round, and reactive to light. Right eye exhibits no discharge. Left eye exhibits no discharge.  Cardiovascular: Normal rate, regular rhythm, normal heart sounds and intact distal pulses.  Exam reveals no gallop and no friction rub.   No murmur heard. Pulmonary/Chest: Effort normal and breath sounds normal. No respiratory distress. She has no wheezes. She has no rales.  Musculoskeletal: Normal range of motion. She exhibits no edema or tenderness.  Neurological: She is alert and oriented to person, place, and time. She has normal reflexes. She displays normal reflexes. No cranial nerve deficit. She exhibits normal muscle tone. Coordination normal.  Skin: Skin is warm and dry. No rash noted. She is not diaphoretic. No erythema. No pallor.  Psychiatric: She has a normal mood and affect. Her behavior is normal. Judgment and thought content normal.  Nursing note and vitals reviewed.     Assessment & Plan:  1. Alzheimer's dementia - No additional decline noticed at this time.  - Continue with current medication therapy  - Follow up with Dr. Tomi Likens in December  2. Essential hypertension - At goal  - No change  -  Continue to monitor.   Dorothyann Peng, NP

## 2015-12-15 NOTE — Patient Instructions (Signed)
It was great seeing you again!  You look like you are doing really well! Keep up the good work.   Follow up with me in February for your next physical. If you need anything before that , please let me know

## 2015-12-23 ENCOUNTER — Telehealth: Payer: Self-pay | Admitting: Neurology

## 2015-12-23 DIAGNOSIS — G309 Alzheimer's disease, unspecified: Principal | ICD-10-CM

## 2015-12-23 DIAGNOSIS — F0281 Dementia in other diseases classified elsewhere with behavioral disturbance: Secondary | ICD-10-CM

## 2015-12-24 MED ORDER — QUETIAPINE FUMARATE 25 MG PO TABS: 25.0000 mg | ORAL_TABLET | Freq: Every day | ORAL | 1 refills | Status: DC

## 2015-12-24 NOTE — Telephone Encounter (Signed)
Called and spoke to daughter, Julia [name]. Pt is having increasingly difficulty with behavioral issues. Pt will get angry at the drop of a dime over her driving (pt is on a 6 month re-evaluation for the DMV). Pt is paranoid, thinks every doctor is out to get her, will take her license away, etc. Pt and husband want to keep patient at home, but husband is now getting frustrated and  Will threaten to put her in a home, which, in turn only upsets pt worse. Pt is constantly angry at a rotating family member. Daughter stated they had previous tried to get pt in with behavioral health, and she refused.  Asked about possible in home help, daughter stated that father and pt were leary about soemeone coming into the house, as on top of dementia pt also suffers from some social anxieties. Please advise.    Will work on [employer] form and place in your inbox when complete.

## 2015-12-24 NOTE — Telephone Encounter (Signed)
I would like to start Seroquel 25mg  at bedtime for her agitation.  I also asked Marcie Bal to stop the trazodone, as Seroquel may help with sleep as well.  I discussed side effects, including black box warning of mortality and morbidity.    She needs to have her DMV re-evaluation.  It was recommended by the [facility] OT that she should be re-evaluated if there is any change in medication that may influence driving or if there is any change in behavior.  Therefore, I would like her to be re-evaluated by OT followed by follow-up with me afterwards prior to filling out forms.    I answered all questions to the best of my ability.

## 2015-12-24 NOTE — Telephone Encounter (Signed)
New Rx for Seroquel sent in. Faxed new referral with request for them to call and reschedule pt for a new driving eval. This was faxed to [employer] Fax: [phone] Ph: [phone]. Did request they contact Mrs. Julia [name] to schedule.

## 2015-12-29 ENCOUNTER — Telehealth: Payer: Self-pay | Admitting: Neurology

## 2015-12-29 NOTE — Telephone Encounter (Signed)
Julia [name], Daughter, whom I assume is the one who called. Marcie Bal answered and said hello, never responded again. Line disconnected. Will attempt again later. Assume it was in regards to [facility] form.

## 2016-01-06 ENCOUNTER — Encounter: Payer: Self-pay | Admitting: Neurology

## 2016-01-06 ENCOUNTER — Telehealth: Payer: Self-pay

## 2016-01-06 ENCOUNTER — Ambulatory Visit (INDEPENDENT_AMBULATORY_CARE_PROVIDER_SITE_OTHER): Payer: Medicare Other | Admitting: Neurology

## 2016-01-06 VITALS — BP 114/58 | HR 98 | Ht 60.0 in | Wt 114.8 lb

## 2016-01-06 DIAGNOSIS — F411 Generalized anxiety disorder: Secondary | ICD-10-CM

## 2016-01-06 DIAGNOSIS — G309 Alzheimer's disease, unspecified: Principal | ICD-10-CM

## 2016-01-06 DIAGNOSIS — G308 Other Alzheimer's disease: Secondary | ICD-10-CM

## 2016-01-06 DIAGNOSIS — F0281 Dementia in other diseases classified elsewhere with behavioral disturbance: Secondary | ICD-10-CM | POA: Diagnosis not present

## 2016-01-06 MED ORDER — GALANTAMINE HYDROBROMIDE ER 24 MG PO CP24: 24.0000 mg | ORAL_CAPSULE | Freq: Every day | ORAL | 5 refills | Status: DC

## 2016-01-06 MED ORDER — MIRTAZAPINE 15 MG PO TABS: 15.0000 mg | ORAL_TABLET | Freq: Every day | ORAL | 5 refills | Status: DC

## 2016-01-06 NOTE — Telephone Encounter (Signed)
Dr. Tomi Likens asked me to reach out to daughter to let them know that the sertraline would also need to be discontinued

## 2016-01-06 NOTE — Progress Notes (Addendum)
NEUROLOGY FOLLOW UP OFFICE NOTE  Julia [name] [id number]  HISTORY OF PRESENT ILLNESS: Julia [name] is a 81 year old woman with HTN, hyperlipidemia, anxiety and thyroid disease, who returns for Alzheimer's dementia.  She is accompanied by her husband and daughter, who supplement history.   UPDATE: She currently takes galantamine ER 16mg  daily and memantine XR 28mg  daily.  She also takes Zoloft 50mg  daily.  She has become increasingly paranoid with fear that doctors are trying to take away her license.  She easily becomes upset.  She was started on Seroquel 25mg  at bedtime for her agitation.  She has lost weight since her diagnosis as her appetite is poor.  It is difficult to get her to eat.  Since last visit, she has gotten lost on 3 separate occasions, requiring her to call her daughters for help on getting re-oriented.  Her daughter does not feel safe when her mother is driving the car.  She is "all over the road and had difficulty maintaining a consistent speed".  She is up for a 6 month re-evaluation for the DMV.  She continues to live independently with her husband.  She tends to her husband and the house work.  The house is kept clean.  Somebody comes to clean the house once a month.  Her daughter sets up her pillbox for the week.  Her daughter also helps with some of the finances.     HISTORY: Onset of symptoms began 7 years ago, starting with misplacing things such as her keys, and would often repeat questions. She is particularly having short-term memory problems. She frequently repeats questions and forgets why she walked into a room.  She does not forget faces.  She still pays the bills, but with the guidance of her daughter, because she sometimes forgets about some bills.  She has a long-standing history of anxiety, which got worse after the passing of her brother and later one of her daughters.  She occasionally is confused but doesn't really become disoriented when driving.  She rarely drives and only during the day to the hair salon or occasionally [facility]. She is able to perform all her ADLs, such as bathing, dressing and feeding herself.  Her husband is disabled and she takes care of most of the house chores.  She does the laundry, washes the dishes and cleans the house.     MRI of the brain from 12/04/12 was reviewed and revealed mild to moderate atrophy with moderately extensive small vessel ischemic changes and small chronic lacunar infarct.   PAST MEDICAL HISTORY: Past Medical History:  Diagnosis Date  . Alzheimer's dementia   . Anxiety   . Concussion with no loss of consciousness 05/14/2008   Qualifier: Diagnosis of  By: Arnoldo Morale MD, Balinda Quails   . Dementia   . GERD (gastroesophageal reflux disease)   . History of colonic polyps   . Hx of echocardiogram    Echo 4/16:  EF 55-60%, mild LVH  . Hyperlipidemia   . Hypertension   . Hypothyroidism   . Osteopenia   . Right forearm fracture 2011  . Thyroid disease    hypothyroidism    MEDICATIONS: Current Outpatient Prescriptions on File Prior to Visit  Medication Sig Dispense Refill  . ALPRAZolam (XANAX) 0.25 MG tablet Take 1 tablet (0.25 mg total) by mouth at bedtime as needed for anxiety. 60 tablet 0  . amLODipine (NORVASC) 5 MG tablet Take 1 tablet (5 mg total) by mouth daily. 30 tablet  5  . aspirin 81 MG tablet Take 81 mg by mouth at bedtime.     . Bismuth Subsalicylate (KAOPECTATE PO) Take by mouth. Reported on 10/17/2015    . HYDROcodone-acetaminophen (NORCO) 5-325 MG tablet Take 1 tablet by mouth every 6 (six) hours as needed for moderate pain. 20 tablet 0  . hydrOXYzine (ATARAX/VISTARIL) 25 MG tablet Take 0.5-1 tablets (12.5-25 mg total) by mouth every 8 (eight) hours as needed for itching. 30 tablet 0  . Ibuprofen 200 MG CAPS Take 1 capsule (200 mg total) by mouth daily as needed. 120 each 0  . levothyroxine (SYNTHROID, LEVOTHROID) 88 MCG tablet TAKE 1 TABLET BY MOUTH DAILY BEFORE BREAKFAST 90  tablet 2  . mirabegron ER (MYRBETRIQ) 25 MG TB24 tablet Take 25 mg by mouth daily.    . Multiple Vitamin (MULTIVITAMIN) tablet Take 1 tablet by mouth daily.    Marland Kitchen NAMENDA XR 28 MG CP24 24 hr capsule take 1 capsule by mouth daily 30 capsule 6  . Omega-3 Fatty Acids (FISH OIL) 1000 MG CAPS Take 1,000 mg by mouth daily.    . polyethylene glycol (MIRALAX / GLYCOLAX) packet Take 17 g by mouth daily. Reported on 10/17/2015    . QUEtiapine (SEROQUEL) 25 MG tablet Take 1 tablet (25 mg total) by mouth at bedtime. 30 tablet 1  . rosuvastatin (CRESTOR) 20 MG tablet Take 1 tablet (20 mg total) by mouth once a week. 15 tablet 3  . sertraline (ZOLOFT) 50 MG tablet Take 1 tablet (50 mg total) by mouth daily. 30 tablet 6  . triamcinolone cream (KENALOG) 0.1 % Apply 1 application topically 2 (two) times daily. 30 g 0   No current facility-administered medications on file prior to visit.     ALLERGIES: Allergies  Allergen Reactions  . Entex Other (See Comments)    unknown  . Sulfonamide Derivatives Other (See Comments)    unknown    FAMILY HISTORY: Family History  Problem Relation Age of Onset  . Hypertension Mother   . COPD Mother   . Tuberculosis Mother     x 2  . Lung cancer Father   . Stomach cancer Father   . Heart disease Brother   . Drug abuse Maternal Uncle   . Cancer Maternal Uncle   . Alzheimer's disease Maternal Aunt   . Colon cancer Neg Hx   . Esophageal cancer Neg Hx   . Rectal cancer Neg Hx   . Heart attack Neg Hx   . Stroke Neg Hx     SOCIAL HISTORY: Social History   Social History  . Marital status: Married    Spouse name: N/A  . Number of children: N/A  . Years of education: N/A   Occupational History  . retired    Social History Main Topics  . Smoking status: Never Smoker  . Smokeless tobacco: Never Used  . Alcohol use No  . Drug use: No  . Sexual activity: No   Other Topics Concern  . Not on file   Social History Narrative   Married to husband Julia [name]  for 61 years and lives in [place]. 2 daughters live in [place], 1 son in [place] and 1 in [place].       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       Hobbies: tv, caring for cat (very important to her), change is difficult even with type of tv      Julia [name] (daughter) Julia [name] [phone]   Full  Code    REVIEW OF SYSTEMS: Constitutional: No fevers, chills, or sweats, no generalized fatigue, change in appetite Eyes: No visual changes, double vision, eye pain Ear, nose and throat: No hearing loss, ear pain, nasal congestion, sore throat Cardiovascular: No chest pain, palpitations Respiratory:  No shortness of breath at rest or with exertion, wheezes GastrointestinaI: No nausea, vomiting, diarrhea, abdominal pain, fecal incontinence Genitourinary:  No dysuria, urinary retention or frequency Musculoskeletal:  No neck pain, back pain Integumentary: No rash, pruritus, skin lesions Neurological: as above Psychiatric: No depression, insomnia, anxiety Endocrine: No palpitations, fatigue, diaphoresis, mood swings, change in appetite, change in weight, increased thirst Hematologic/Lymphatic:  No purpura, petechiae. Allergic/Immunologic: no itchy/runny eyes, nasal congestion, recent allergic reactions, rashes  PHYSICAL EXAM: Vitals:   01/06/16 1327  BP: (!) 114/58  Pulse: 98   General: No acute distress.  Patient appears well-groomed.  normal body habitus. Head:  Normocephalic/atraumatic Eyes:  Fundi examined but not visualized Neck: supple, no paraspinal tenderness, full range of motion Heart:  Regular rate and rhythm Lungs:  Clear to auscultation bilaterally Back: No paraspinal tenderness Neurological Exam: alert and oriented to person, place, and time (except month and day). Attention span and concentration fair, recent memory poor, remote memory intact, fund of knowledge intact.  Speech fluent and not dysarthric, language intact.   Montreal Cognitive  Assessment  01/06/2016 08/18/2015 02/10/2015 03/07/2014  Visuospatial/ Executive (0/5) 2 3 4 1   Naming (0/3) 3 2 1 3   Attention: Read list of digits (0/2) 2 2 2 2   Attention: Read list of letters (0/1) 1 1 1  0  Attention: Serial 7 subtraction starting at 100 (0/3) 1 0 0 0  Language: Repeat phrase (0/2) 2 2 1 1   Language : Fluency (0/1) 1 0 1 0  Abstraction (0/2) 2 1 1 1   Delayed Recall (0/5) 0 0 0 0  Orientation (0/6) 4 3 5 5   Total 18 14 16 13   Adjusted Score (based on education) 19 15 17 14     IMPRESSION: Alzheimer's disease Generalized anxiety  PLAN: There has been a decline in mood and her driving ability.  At this point, I don't feel she is safe to drive.  I will fill out DMV form recommending that she not drive.  I have explained this and my reasoning with her and her family.  Both the patient and her husband became upset.  Her daughter understands. Increase galantamine ER to 24mg  daily.  Continue Namenda XR 28mg  daily. Stop imipramine and sertraline.  Start Remeron 15mg  at bedtime to address depression and to stimulate appetite Should drink 2 Boosts daily Follow up in 6 months.  30 minutes spent face to face with patient, over 50% spent counseling.  Metta Clines, DO  CC:  Dorothyann Peng, NP

## 2016-01-06 NOTE — Patient Instructions (Signed)
Unfortunately, I do not think you are no longer safe to drive.  Please understand that I make this decision with your well-being in mind. 1.  We will increase galantamine ER to 24mg  daily 2.  Stop imipramine.  Start mirtazepine 15mg  at bedtime.  This will help address depression, poor sleep and will stimulate appetite 3.  Drink at least 2 Boosts daily 4.  Continue Namenda XR 28mg  daily 5.  Follow up in 6 months.

## 2016-01-07 ENCOUNTER — Telehealth: Payer: Self-pay

## 2016-01-07 NOTE — Telephone Encounter (Signed)
Daughter Marcie Bal called. Would like to know if parents refuse to return to clinic, is it the best idea to start switching medication? Other daughter is going to talk to pt and husband tonight with ult[employer] about putting pt's health first. Please advise.   Marcie Bal okay with 8 A.M call.

## 2016-01-08 NOTE — Telephone Encounter (Signed)
Spoke with Mrs. Julia [name], daughter. She is aware. Will contact is if we can be of further assistance.

## 2016-01-08 NOTE — Telephone Encounter (Signed)
If they don't plan on following up with me, then I would just have them check with the PCP to see if he doesn't mind managing those changes.

## 2016-02-22 ENCOUNTER — Telehealth: Payer: Self-pay | Admitting: Neurology

## 2016-02-23 NOTE — Telephone Encounter (Signed)
PT called in regards to her medication being denied/Dawn CB# [phone]

## 2016-02-23 NOTE — Telephone Encounter (Signed)
Last note states she may not follow up here but she does have follow up scheduled in December. Last office note doesn't mention Seroquel. Please advise if okay to refill.

## 2016-02-24 ENCOUNTER — Telehealth: Payer: Self-pay | Admitting: Adult Health

## 2016-02-24 ENCOUNTER — Other Ambulatory Visit: Payer: Self-pay | Admitting: *Deleted

## 2016-02-24 MED ORDER — QUETIAPINE FUMARATE 25 MG PO TABS: 25.0000 mg | ORAL_TABLET | Freq: Every day | ORAL | 0 refills | Status: DC

## 2016-02-24 NOTE — Telephone Encounter (Signed)
See below

## 2016-02-24 NOTE — Telephone Encounter (Signed)
I spoke with patient's daughter and informed her that I would send in one month's worth of medication since patient will be seeing her PCP tomorrow and may switch back to him.

## 2016-02-24 NOTE — Telephone Encounter (Signed)
pts daughter would like to have a call concerning the following since she will not be able to come in with her mother on tomorrow (9/28).   1. DMV has the paperwork for them discontinuing her driving and it is totally up to [facility] so please do not feel you are obligated to do anything else.  2. Medication continuation from Dr. Tomi Likens pt refuse to continue to see Dr. Tomi Likens and wanted to know if you would be will to refill the following Rx's (galantamine,mirtazapine,NAMENDA XR, QUEtiapine and sertraline) when they are needed to be refilled.  Daughter states that all of these Rx's are working very well for the patient and for the daughter.

## 2016-02-25 ENCOUNTER — Encounter: Payer: Self-pay | Admitting: Adult Health

## 2016-02-25 ENCOUNTER — Ambulatory Visit (INDEPENDENT_AMBULATORY_CARE_PROVIDER_SITE_OTHER): Payer: Medicare Other | Admitting: Adult Health

## 2016-02-25 VITALS — BP 124/64 | Temp 98.2°F | Ht 60.0 in | Wt 114.8 lb

## 2016-02-25 DIAGNOSIS — F0280 Dementia in other diseases classified elsewhere without behavioral disturbance: Secondary | ICD-10-CM | POA: Diagnosis not present

## 2016-02-25 DIAGNOSIS — M25511 Pain in right shoulder: Secondary | ICD-10-CM

## 2016-02-25 DIAGNOSIS — M25512 Pain in left shoulder: Secondary | ICD-10-CM | POA: Diagnosis not present

## 2016-02-25 DIAGNOSIS — G309 Alzheimer's disease, unspecified: Secondary | ICD-10-CM

## 2016-02-25 NOTE — Progress Notes (Signed)
   Subjective:    Patient ID: Julia [name], female    DOB: [birth year], 81 y.o.   MRN: [id number]  HPI  81 year old female who presents to the office today for the complaint of bilateral shoulder pain that is worse in the evening when she is getting ready for bed. This is a chronic issue and she feels as though it is arthritic pain. The pain does not interfere with her ability to preform ADL's. She reports that when she takes Advil at night that it helps her sleep.   She would also like to know if there is anything I can do to get her drivers license back. She is very upset that her license has been taken away from her. Per Dr. Georgie Chard note on their last visit with him in August of this year, he did not feel that she was safe driving any longer.prior to that visit with Dr.Jaffe she has gotten lost on 3 separate occasions, requiring her to call her daughters for help on getting re-oriented.  Her daughter does not feel safe when her mother is driving the car.  She is "all over the road and had difficulty maintaining a consistent speed".  I advised Julia [name] that there was nothing that I could do. The DMV had made their decision and that although I know that she would never intentionally hurt anyone, this was for the best.   Review of Systems  Musculoskeletal: Positive for arthralgias. Negative for joint swelling and myalgias.  Skin: Negative.   Psychiatric/Behavioral: Positive for confusion and decreased concentration.  All other systems reviewed and are negative.      Objective:   Physical Exam  Constitutional: She is oriented to person, place, and time. She appears well-developed and well-nourished. No distress.  Cardiovascular: Normal rate, regular rhythm, normal heart sounds and intact distal pulses.  Exam reveals no gallop and no friction rub.   No murmur heard. Pulmonary/Chest: Effort normal and breath sounds normal. No respiratory distress. She has no wheezes. She has no rales. She  exhibits no tenderness.  Musculoskeletal: Normal range of motion. She exhibits no edema, tenderness or deformity.  Neurological: She is alert and oriented to person, place, and time.  Skin: Skin is warm and dry. No rash noted. She is not diaphoretic. No erythema. No pallor.  Psychiatric: She has a normal mood and affect. Her behavior is normal. Judgment and thought content normal.  Nursing note and vitals reviewed.     Assessment & Plan:  1. Bilateral shoulder pain - Arthritis pain - Ok to take Advil before bed as needed - Can try SalonPas patches or icy hot - Warm compress - follow up as needed  2. Alzheimer's dementia - She is unable to operate a motor vehicle safely.  - Advised that there was nothing I could do to help her with this issue  Dorothyann Peng, NP

## 2016-02-25 NOTE — Patient Instructions (Signed)
It was great seeing you today!  You can take Advil before you go to bed for the arthritis pain in your shoulders.   You can also try [employer], SalonPas patches or other sports creams to help with the pain - these can be found at any drug store

## 2016-03-20 ENCOUNTER — Other Ambulatory Visit: Payer: Self-pay | Admitting: Family Medicine

## 2016-03-21 NOTE — Telephone Encounter (Signed)
She should have two refills on this medication, please check with pharmacy

## 2016-03-21 NOTE — Telephone Encounter (Signed)
Ok to refill 

## 2016-03-28 ENCOUNTER — Other Ambulatory Visit: Payer: Self-pay | Admitting: Neurology

## 2016-04-04 ENCOUNTER — Other Ambulatory Visit: Payer: Self-pay | Admitting: Adult Health

## 2016-04-30 ENCOUNTER — Other Ambulatory Visit: Payer: Self-pay | Admitting: Neurology

## 2016-05-17 ENCOUNTER — Ambulatory Visit: Payer: Self-pay | Admitting: Neurology

## 2016-05-17 ENCOUNTER — Other Ambulatory Visit: Payer: Self-pay | Admitting: Neurology

## 2016-06-01 ENCOUNTER — Other Ambulatory Visit: Payer: Self-pay | Admitting: Adult Health

## 2016-06-01 ENCOUNTER — Other Ambulatory Visit: Payer: Self-pay | Admitting: Neurology

## 2016-06-02 NOTE — Telephone Encounter (Signed)
It looks like Dr. Tomi Likens has prescribed this medication. Should I route to him, or is this ok to refill?

## 2016-06-02 NOTE — Telephone Encounter (Signed)
Ok to refill for 90 days + 1 

## 2016-06-30 ENCOUNTER — Ambulatory Visit: Payer: Medicare Other | Admitting: Family Medicine

## 2016-07-02 ENCOUNTER — Other Ambulatory Visit: Payer: Self-pay | Admitting: Neurology

## 2016-07-07 ENCOUNTER — Encounter: Payer: Self-pay | Admitting: Family Medicine

## 2016-07-07 ENCOUNTER — Ambulatory Visit (INDEPENDENT_AMBULATORY_CARE_PROVIDER_SITE_OTHER): Payer: Medicare Other | Admitting: Family Medicine

## 2016-07-07 VITALS — BP 146/78 | HR 78 | Temp 98.0°F | Resp 18 | Ht 60.0 in | Wt 119.0 lb

## 2016-07-07 DIAGNOSIS — G309 Alzheimer's disease, unspecified: Secondary | ICD-10-CM

## 2016-07-07 DIAGNOSIS — G4700 Insomnia, unspecified: Secondary | ICD-10-CM

## 2016-07-07 DIAGNOSIS — F0280 Dementia in other diseases classified elsewhere without behavioral disturbance: Secondary | ICD-10-CM

## 2016-07-07 NOTE — Progress Notes (Addendum)
By signing my name below, I, Julia [name], attest that this documentation has been prepared under the direction and in the presence of Julia Ray, MD.  Electronically Signed: Verlee [name], Medical Scribe. 07/07/16. 6:05 PM.  Subjective:    Patient ID: Julia [name], female    DOB: 11-24-1934, 82 y.o.   MRN: [id number]  HPI Chief Complaint  Patient presents with  . Depression    has been depressed since her daughter committed suicide.    . Sleeping Problem    unable to get to sleep    HPI Comments: Julia [name] is a 82 y.o. female who presents to the [facility] complaining of insomnia onset 2-3 years but worsening the last few weeks. Julia [name] is a new pt to me. Julia is followed by Julia Peng, NP, with [facility]. Julia has a hx of multiple medical problems including anxiety, alzheimer dementia. Julia has been seen by Dr. Tomi [name] with neurology for her alzheimers - he noted a decline in mood that Aug 9th 2017 visit - continued on namenda XR 28 mg and increase galantamine extended release to 24 mg, stopped sertraline, stopped imipramine, started remeron 15 mg at bed time, and plan for follow-up in 6 months. There was some hx in that visit with some increased paranoia, more easily upset, had recently started on seroquel QHS PRN for agitation. Her ADLs were intact at that time, including doing most of the house chores as her husband was disabled. Per telephone notes Julia did not plan on returning to see Dr. Tomi [name]. Julia had an office visit with Mr. Julia [name] on Sept 8th. Pt has an upcoming appt with Julia Peng, NP, March 1st. Multiple previous records were reviewed for current visit.   Insomnia: Reports worsening the last few weeks. Pt has trouble getting to sleep and Julia's tired at night but during the day. Pt rests on the couch 3-4 hours in the middle of the day with her eyes open- Julia reports Julia does not nap but her husband states Julia does sleep. Pt  goes to bed around 9 pm -10 pm, falls asleep at 2am - 3am, and gets up around 9:30am. Pt has been taking seroquel QHS and 1 OTC melatonin tablet without relief of her sxs. Pt's daughter committed suicide at 101 y/o 3-4 years ago, but pt can express how Julia feels about it - pt has not meet with a Social worker. Pt states it was her daughter's decision to stop seeing the neurologist. Denies suicidal ideation - Pt mentions Julia has too much to live for to committee suicide and Julia still has to take care of her husband. Pt takes baths in the evening. Pt does not drink coffee, sweet tea, or any caffeinated drinks in the afternoon - Julia mainly drinks water through the day. Denies SI, thoughts of self-harm, visual/audible/physical hallucinations.  Depression screen [facility] 2/9 07/07/2016 10/17/2015 02/12/2015 02/12/2015 11/10/2014  Decreased Interest 1 2 - 0 0  Down, Depressed, Hopeless 3 1 2  0 0  PHQ - 2 Score 4 3 2  0 0  Altered sleeping 3 0 - - -  Tired, decreased energy 3 1 - - -  Change in appetite 0 0 - - -  Feeling bad or failure about yourself  2 1 - - -  Trouble concentrating 3 0 - - -  Moving slowly or fidgety/restless 0 0 - - -  Suicidal thoughts 0 0 - - -  PHQ-9 Score 15 5 - - -  Difficult doing work/chores Somewhat difficult - - - -   Patient Active Problem List   Diagnosis Date Noted  . Sinus bradycardia 09/05/2014  . Junctional escape rhythm 09/05/2014  . Alzheimer's dementia 04/21/2014  . Overactive bladder 04/21/2014  . Hypothyroid 11/21/2013  . PALPITATIONS, RECURRENT 12/09/2008  . Hyperlipidemia 04/18/2007  . Essential hypertension 12/11/2006  . GERD 12/11/2006  . Osteopenia 12/11/2006  . History of colonic polyps 12/11/2006   Past Medical History:  Diagnosis Date  . Alzheimer's dementia   . Anxiety   . Concussion with no loss of consciousness 05/14/2008   Qualifier: Diagnosis of  By: Arnoldo Morale MD, Balinda Quails   . Dementia   . GERD (gastroesophageal reflux disease)   . History of colonic  polyps   . Hx of echocardiogram    Echo 4/16:  EF 55-60%, mild LVH  . Hyperlipidemia   . Hypertension   . Hypothyroidism   . Osteopenia   . Right forearm fracture 2011  . Thyroid disease    hypothyroidism   Past Surgical History:  Procedure Laterality Date  . ABDOMINAL HYSTERECTOMY  2009  . BILATERAL SALPINGOOPHORECTOMY    . bladder tack    . INCISION AND DRAINAGE OF WOUND Left 08/04/2015   Procedure: DEBRIDEMENT DISTAL INTERPHALANGEAL LEFT INDEX FINGER;  Surgeon: Daryll Brod, MD;  Location: [facility];  Service: Orthopedics;  Laterality: Left;  Marland Kitchen MASS EXCISION N/A 08/04/2015   Procedure: EXCISION MUCOID CYST;  Surgeon: Daryll Brod, MD;  Location: [facility];  Service: Orthopedics;  Laterality: N/A;  . MASS EXCISION Left 11/24/2015   Procedure: EXCISION RECURRENT  MUCOID CYST LEFT INDEX  FINGER;  Surgeon: Daryll Brod, MD;  Location: [facility];  Service: Orthopedics;  Laterality: Left;  . ROTATOR CUFF REPAIR     right shoulder  . surgical repair to fx left wrist    . TONSILLECTOMY     Allergies  Allergen Reactions  . Entex Other (See Comments)    unknown  . Sulfonamide Derivatives Other (See Comments)    unknown   Prior to Admission medications   Medication Sig Start Date End Date Taking? Authorizing Provider  ALPRAZolam (XANAX) 0.25 MG tablet Take 1 tablet (0.25 mg total) by mouth at bedtime as needed for anxiety. 02/25/14  Yes Marin Olp, MD  amLODipine (NORVASC) 5 MG tablet take 1 tablet by mouth once daily 04/04/16  Yes Julia Peng, NP  aspirin 81 MG tablet Take 81 mg by mouth at bedtime.    Yes Historical Provider, MD  Bismuth Subsalicylate (KAOPECTATE PO) Take by mouth. Reported on 10/17/2015   Yes Historical Provider, MD  galantamine (RAZADYNE ER) 24 MG 24 hr capsule TAKE 1 CAPSULE BY MOUTH DAILY WITH BREAKFAST 07/04/16  Yes Pieter Partridge, DO  HYDROcodone-acetaminophen (NORCO) 5-325 MG tablet Take 1 tablet by mouth every 6 (six)  hours as needed for moderate pain. 11/24/15  Yes Daryll Brod, MD  hydrOXYzine (ATARAX/VISTARIL) 25 MG tablet Take 0.5-1 tablets (12.5-25 mg total) by mouth every 8 (eight) hours as needed for itching. 10/17/15  Yes Robyn Haber, MD  Ibuprofen 200 MG CAPS Take 1 capsule (200 mg total) by mouth daily as needed. 09/06/14  Yes Scott Joylene Draft, PA-C  levothyroxine (SYNTHROID, LEVOTHROID) 88 MCG tablet TAKE 1 TABLET BY MOUTH DAILY BEFORE BREAKFAST 03/22/16  Yes Julia Peng, NP  mirabegron ER (MYRBETRIQ) 25 MG TB24 tablet Take 25 mg by mouth daily.   Yes Historical Provider, MD  mirtazapine (REMERON)  15 MG tablet Take 1 tablet (15 mg total) by mouth at bedtime. 01/06/16  Yes Pieter Partridge, DO  Multiple Vitamin (MULTIVITAMIN) tablet Take 1 tablet by mouth daily.   Yes Historical Provider, MD  NAMENDA XR 28 MG CP24 24 hr capsule take 1 capsule by mouth daily 05/17/16  Yes Adam Telford Nab, DO  Omega-3 Fatty Acids (FISH OIL) 1000 MG CAPS Take 1,000 mg by mouth daily.   Yes Historical Provider, MD  polyethylene glycol (MIRALAX / GLYCOLAX) packet Take 17 g by mouth daily. Reported on 10/17/2015   Yes Historical Provider, MD  QUEtiapine (SEROQUEL) 25 MG tablet take 1 tablet by mouth at bedtime 06/02/16  Yes Julia Peng, NP  rosuvastatin (CRESTOR) 20 MG tablet Take 1 tablet (20 mg total) by mouth once a week. 09/12/14  Yes Marin Olp, MD  sertraline (ZOLOFT) 50 MG tablet Take 1 tablet (50 mg total) by mouth daily. 11/25/15  Yes Pieter Partridge, DO  triamcinolone cream (KENALOG) 0.1 % Apply 1 application topically 2 (two) times daily. 10/17/15  Yes Robyn Haber, MD   Social History   Social History  . Marital status: Married    Spouse name: N/A  . Number of children: N/A  . Years of education: N/A   Occupational History  . retired    Social History Main Topics  . Smoking status: Never Smoker  . Smokeless tobacco: Never Used  . Alcohol use No  . Drug use: No  . Sexual activity: No   Other Topics Concern    . Not on file   Social History Narrative   Married to husband Ardyth Gal for 79 years and lives in [place]. 2 daughters live in [place], 1 son in [place] and 1 in [place].       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       Hobbies: tv, caring for cat (very important to her), change is difficult even with type of tv      Jake Samples (daughter) Chauncey Reading [phone]   Full Code   Review of Systems  Psychiatric/Behavioral: Positive for confusion (chronic with alzheimers) and sleep disturbance. Negative for hallucinations, self-injury and suicidal ideas.   Objective:  Physical Exam  Constitutional: Julia appears well-developed and well-nourished. No distress.  HENT:  Head: Normocephalic and atraumatic.  Eyes: Conjunctivae are normal.  Neck: Neck supple.  Cardiovascular: Normal rate, regular rhythm and normal heart sounds.  Exam reveals no friction rub.   No murmur heard. Pulmonary/Chest: Effort normal and breath sounds normal. No respiratory distress. Julia has no wheezes. Julia has no rales.  Musculoskeletal: Julia exhibits no edema.  Neurological: Julia is alert.  Skin: Skin is warm and dry.  Psychiatric: Julia has a normal mood and affect. Her speech is normal and behavior is normal. Thought content normal. Julia is not agitated and not actively hallucinating. Julia expresses no suicidal ideation. Julia expresses no suicidal plans.  No A/V/T hallucinations  Nursing note and vitals reviewed.  BP (!) 146/78 (BP Location: Right Arm, Patient Position: Sitting, Cuff Size: Normal)   Pulse 78   Temp 98 F (36.7 C) (Oral)   Resp 18   Ht 5' (1.524 m)   Wt 119 lb (54 kg)   SpO2 96%   BMI 23.24 kg/m  Assessment & Plan:   Julia [name] is a 82 y.o. female Insomnia, unspecified type  Alzheimer's dementia without behavioral disturbance, unspecified timing of dementia onset  May be multifactorial insomnia with dementia.  On further discussion, her daytime naps may be impacting  her ability to get to sleep at night. Reviewed current meds, and is supposed to be taking Remeron and Seroquel at night, but was unable to verify Julia has been taking those meds.   - advised to verify Julia is taking Remeron and Seroquel at night.   - ok to continue melatonin otc.   - decreasing afternoon nap time may be helpful  -deferred other meds to PCP as risks of oversedation with adding to meds above if Julia is taking them.   -follow up with PCP.   No orders of the defined types were placed in this encounter.  Patient Instructions   Your difficulty with sleep at night may be due to multiple causes. Initially I would want to make sure that you are taking your Remeron and Seroquel at night before I would add any other medication. You can also decrease nap or resting times in the middle of the day to see if this helps with getting to sleep at night. Ok to take melatonin, but if not improved into next week (and you are on all your usual medications) I would recommend discussing changes or additional medication with your primary care provider prior to the scheduled March 1st visit.  Return to the clinic or go to the nearest emergency room if any of your symptoms worsen or new symptoms occur.  Insomnia Insomnia is a sleep disorder that makes it difficult to fall asleep or to stay asleep. Insomnia can cause tiredness (fatigue), low energy, difficulty concentrating, mood swings, and poor performance at work or school. There are three different ways to classify insomnia:  Difficulty falling asleep.  Difficulty staying asleep.  Waking up too early in the morning. Any type of insomnia can be long-term (chronic) or short-term (acute). Both are common. Short-term insomnia usually lasts for three months or less. Chronic insomnia occurs at least three times a week for longer than three months. What are the causes? Insomnia may be caused by another condition, situation, or substance, such  as:  Anxiety.  Certain medicines.  Gastroesophageal reflux disease (GERD) or other gastrointestinal conditions.  Asthma or other breathing conditions.  Restless legs syndrome, sleep apnea, or other sleep disorders.  Chronic pain.  Menopause. This may include hot flashes.  Stroke.  Abuse of alcohol, tobacco, or illegal drugs.  Depression.  Caffeine.  Neurological disorders, such as Alzheimer disease.  An overactive thyroid (hyperthyroidism). The cause of insomnia may not be known. What increases the risk? Risk factors for insomnia include:  Gender. Women are more commonly affected than men.  Age. Insomnia is more common as you get older.  Stress. This may involve your professional or personal life.  Income. Insomnia is more common in people with lower income.  Lack of exercise.  Irregular work schedule or night shifts.  Traveling between different time zones. What are the signs or symptoms? If you have insomnia, trouble falling asleep or trouble staying asleep is the main symptom. This may lead to other symptoms, such as:  Feeling fatigued.  Feeling nervous about going to sleep.  Not feeling rested in the morning.  Having trouble concentrating.  Feeling irritable, anxious, or depressed. How is this treated? Treatment for insomnia depends on the cause. If your insomnia is caused by an underlying condition, treatment will focus on addressing the condition. Treatment may also include:  Medicines to help you sleep.  Counseling or therapy.  Lifestyle adjustments. Follow these instructions at home:  Take medicines only as directed by your health care provider.  Keep regular sleeping and waking hours. Avoid naps.  Keep a sleep diary to help you and your health care provider figure out what could be causing your insomnia. Include:  When you sleep.  When you wake up during the night.  How well you sleep.  How rested you feel the next day.  Any  side effects of medicines you are taking.  What you eat and drink.  Make your bedroom a comfortable place where it is easy to fall asleep:  Put up shades or special blackout curtains to block light from outside.  Use a white noise machine to block noise.  Keep the temperature cool.  Exercise regularly as directed by your health care provider. Avoid exercising right before bedtime.  Use relaxation techniques to manage stress. Ask your health care provider to suggest some techniques that may work well for you. These may include:  Breathing exercises.  Routines to release muscle tension.  Visualizing peaceful scenes.  Cut back on alcohol, caffeinated beverages, and cigarettes, especially close to bedtime. These can disrupt your sleep.  Do not overeat or eat spicy foods right before bedtime. This can lead to digestive discomfort that can make it hard for you to sleep.  Limit screen use before bedtime. This includes:  Watching TV.  Using your smartphone, tablet, and computer.  Stick to a routine. This can help you fall asleep faster. Try to do a quiet activity, brush your teeth, and go to bed at the same time each night.  Get out of bed if you are still awake after 15 minutes of trying to sleep. Keep the lights down, but try reading or doing a quiet activity. When you feel sleepy, go back to bed.  Make sure that you drive carefully. Avoid driving if you feel very sleepy.  Keep all follow-up appointments as directed by your health care provider. This is important. Contact a health care provider if:  You are tired throughout the day or have trouble in your daily routine due to sleepiness.  You continue to have sleep problems or your sleep problems get worse. Get help right away if:  You have serious thoughts about hurting yourself or someone else. This information is not intended to replace advice given to you by your health care provider. Make sure you discuss any questions  you have with your health care provider. Document Released: 05/13/2000 Document Revised: 10/16/2015 Document Reviewed: 02/14/2014 Elsevier Interactive Patient Education  2017 [employer].     IF you received an x-[name] today, you will receive an invoice from [facility]. Please contact [facility] at [phone] with questions or concerns regarding your invoice.   IF you received labwork today, you will receive an invoice from [facility]. Please contact [facility] at [phone] with questions or concerns regarding your invoice.   Our billing staff will not be able to assist you with questions regarding bills from these companies.  You will be contacted with the lab results as soon as they are available. The fastest way to get your results is to activate your My Chart account. Instructions are located on the last page of this paperwork. If you have not heard from [place] regarding the results in 2 weeks, please contact this office.          I personally performed the services described in this documentation, which was scribed in my presence. The recorded information has been reviewed and considered for accuracy  and completeness, addended by me as needed, and agree with information above.  Signed,   Julia Ray, MD [facility] [place].  07/08/16 9:33 PM

## 2016-07-07 NOTE — Patient Instructions (Addendum)
Your difficulty with sleep at night may be due to multiple causes. Initially I would want to make sure that you are taking your Remeron and Seroquel at night before I would add any other medication. You can also decrease nap or resting times in the middle of the day to see if this helps with getting to sleep at night. Ok to take melatonin, but if not improved into next week (and you are on all your usual medications) I would recommend discussing changes or additional medication with your primary care provider prior to the scheduled March 1st visit.  Return to the clinic or go to the nearest emergency room if any of your symptoms worsen or new symptoms occur.  Insomnia Insomnia is a sleep disorder that makes it difficult to fall asleep or to stay asleep. Insomnia can cause tiredness (fatigue), low energy, difficulty concentrating, mood swings, and poor performance at work or school. There are three different ways to classify insomnia:  Difficulty falling asleep.  Difficulty staying asleep.  Waking up too early in the morning. Any type of insomnia can be long-term (chronic) or short-term (acute). Both are common. Short-term insomnia usually lasts for three months or less. Chronic insomnia occurs at least three times a week for longer than three months. What are the causes? Insomnia may be caused by another condition, situation, or substance, such as:  Anxiety.  Certain medicines.  Gastroesophageal reflux disease (GERD) or other gastrointestinal conditions.  Asthma or other breathing conditions.  Restless legs syndrome, sleep apnea, or other sleep disorders.  Chronic pain.  Menopause. This may include hot flashes.  Stroke.  Abuse of alcohol, tobacco, or illegal drugs.  Depression.  Caffeine.  Neurological disorders, such as Alzheimer disease.  An overactive thyroid (hyperthyroidism). The cause of insomnia may not be known. What increases the risk? Risk factors for insomnia  include:  Gender. Women are more commonly affected than men.  Age. Insomnia is more common as you get older.  Stress. This may involve your professional or personal life.  Income. Insomnia is more common in people with lower income.  Lack of exercise.  Irregular work schedule or night shifts.  Traveling between different time zones. What are the signs or symptoms? If you have insomnia, trouble falling asleep or trouble staying asleep is the main symptom. This may lead to other symptoms, such as:  Feeling fatigued.  Feeling nervous about going to sleep.  Not feeling rested in the morning.  Having trouble concentrating.  Feeling irritable, anxious, or depressed. How is this treated? Treatment for insomnia depends on the cause. If your insomnia is caused by an underlying condition, treatment will focus on addressing the condition. Treatment may also include:  Medicines to help you sleep.  Counseling or therapy.  Lifestyle adjustments. Follow these instructions at home:  Take medicines only as directed by your health care provider.  Keep regular sleeping and waking hours. Avoid naps.  Keep a sleep diary to help you and your health care provider figure out what could be causing your insomnia. Include:  When you sleep.  When you wake up during the night.  How well you sleep.  How rested you feel the next day.  Any side effects of medicines you are taking.  What you eat and drink.  Make your bedroom a comfortable place where it is easy to fall asleep:  Put up shades or special blackout curtains to block light from outside.  Use a white noise machine to block noise.  Keep the temperature cool.  Exercise regularly as directed by your health care provider. Avoid exercising right before bedtime.  Use relaxation techniques to manage stress. Ask your health care provider to suggest some techniques that may work well for you. These may include:  Breathing  exercises.  Routines to release muscle tension.  Visualizing peaceful scenes.  Cut back on alcohol, caffeinated beverages, and cigarettes, especially close to bedtime. These can disrupt your sleep.  Do not overeat or eat spicy foods right before bedtime. This can lead to digestive discomfort that can make it hard for you to sleep.  Limit screen use before bedtime. This includes:  Watching TV.  Using your smartphone, tablet, and computer.  Stick to a routine. This can help you fall asleep faster. Try to do a quiet activity, brush your teeth, and go to bed at the same time each night.  Get out of bed if you are still awake after 15 minutes of trying to sleep. Keep the lights down, but try reading or doing a quiet activity. When you feel sleepy, go back to bed.  Make sure that you drive carefully. Avoid driving if you feel very sleepy.  Keep all follow-up appointments as directed by your health care provider. This is important. Contact a health care provider if:  You are tired throughout the day or have trouble in your daily routine due to sleepiness.  You continue to have sleep problems or your sleep problems get worse. Get help right away if:  You have serious thoughts about hurting yourself or someone else. This information is not intended to replace advice given to you by your health care provider. Make sure you discuss any questions you have with your health care provider. Document Released: 05/13/2000 Document Revised: 10/16/2015 Document Reviewed: 02/14/2014 Elsevier Interactive Patient Education  2017 [employer].     IF you received an x-ray today, you will receive an invoice from [facility]. Please contact [facility] at [phone] with questions or concerns regarding your invoice.   IF you received labwork today, you will receive an invoice from [place] [facility]. Please contact [facility] at [phone] with questions or concerns regarding your invoice.    Our billing staff will not be able to assist you with questions regarding bills from these companies.  You will be contacted with the lab results as soon as they are available. The fastest way to get your results is to activate your My Chart account. Instructions are located on the last page of this paperwork. If you have not heard from [place] regarding the results in 2 weeks, please contact this office.

## 2016-07-08 ENCOUNTER — Telehealth: Payer: Self-pay

## 2016-07-08 NOTE — Telephone Encounter (Signed)
PATIENT'S HUSBAND (CHESTER) CALLED TO SAY HIS WIFE SAW DR. Carlota Raspberry YESTERDAY (07/07/16) BECAUSE SHE HAS NOT BEEN ABLE TO SLEEP. HE WAS GOING TO SEND HER SOME MEDICATION INTO THE PHARMACY. HE CALLED YESTERDAY AND TODAY AND THE PHARMACIST TOLD HIM NOTHING HAS BEEN SENT. HE SAID SHE REALLY NEEDS TO GET SOMETHING AS SOON AS POSSIBLE. BEST PHONE [phone] (HOME) OR [phone] (CELL) PHARMACY CHOICE IS [facility] [address] [place]

## 2016-07-08 NOTE — Telephone Encounter (Signed)
Advised patient's husband that no medications were prescribed at yesterday's visit, and reviewed the documentation on the AVS.  Per husband, the patient slept really well last night.

## 2016-07-16 ENCOUNTER — Other Ambulatory Visit: Payer: Self-pay | Admitting: Neurology

## 2016-07-28 ENCOUNTER — Ambulatory Visit: Payer: Medicare Other

## 2016-07-28 ENCOUNTER — Ambulatory Visit (INDEPENDENT_AMBULATORY_CARE_PROVIDER_SITE_OTHER): Payer: Medicare Other | Admitting: Adult Health

## 2016-07-28 ENCOUNTER — Encounter: Payer: Self-pay | Admitting: Adult Health

## 2016-07-28 VITALS — BP 166/62 | Temp 97.6°F | Ht 64.0 in | Wt 118.8 lb

## 2016-07-28 DIAGNOSIS — G309 Alzheimer's disease, unspecified: Secondary | ICD-10-CM | POA: Diagnosis not present

## 2016-07-28 DIAGNOSIS — F0280 Dementia in other diseases classified elsewhere without behavioral disturbance: Secondary | ICD-10-CM | POA: Diagnosis not present

## 2016-07-28 DIAGNOSIS — Z23 Encounter for immunization: Secondary | ICD-10-CM

## 2016-07-28 DIAGNOSIS — Z0000 Encounter for general adult medical examination without abnormal findings: Secondary | ICD-10-CM | POA: Diagnosis not present

## 2016-07-28 NOTE — Progress Notes (Signed)
Subjective:    Patient ID: Julia [name], female    DOB: June 28, 1934, 82 y.o.   MRN: [id number]  HPI  82 year old female who  has a past medical history of Alzheimer's dementia; Anxiety; Concussion with no loss of consciousness (05/14/2008); Dementia; GERD (gastroesophageal reflux disease); History of colonic polyps; echocardiogram; Hyperlipidemia; Hypertension; Hypothyroidism; Osteopenia; Right forearm fracture (2011); and Thyroid disease.   She was seen by Dr. Carlota Raspberry at [facility] urgent care on 07/07/2016 for 2-3 days of insomnia. It was thought that her daytime naps were impacting her ability to sleep at night. It was unable to be determined if she was taking prescribed Remeron and Seroquel at night to help her sleep.   She presents to the office today for 5-6 month follow up. She reports that she is doing well and has no complaints. She believes that she is taking all of her medications.   She was asked how she got to the visit and she reports that she drove here. Her husband is at home. She was reminded that she should not be driving as her license was taken away from her by Dr. Tomi Likens and myself  due to Alzheimer's disease. She became visibly upset with this notion and states " [name]" I tired to explain to the patient that this was for her welfare and that of other people on the road. At this time she refused to talk to me any further.   Review of Systems  Unable to perform ROS: Other   Past Medical History:  Diagnosis Date  . Alzheimer's dementia   . Anxiety   . Concussion with no loss of consciousness 05/14/2008   Qualifier: Diagnosis of  By: Arnoldo Morale MD, Balinda Quails   . Dementia   . GERD (gastroesophageal reflux disease)   . History of colonic polyps   . Hx of echocardiogram    Echo 4/16:  EF 55-60%, mild LVH  . Hyperlipidemia   . Hypertension   . Hypothyroidism   . Osteopenia   . Right  forearm fracture 2011  . Thyroid disease    hypothyroidism    Social History   Social History  . Marital status: Married    Spouse name: N/A  . Number of children: N/A  . Years of education: N/A   Occupational History  . retired    Social History Main Topics  . Smoking status: Never Smoker  . Smokeless tobacco: Never Used  . Alcohol use No  . Drug use: No  . Sexual activity: No   Other Topics Concern  . Not on file   Social History Narrative   Married to husband Julia [name] for 81 years and lives in [place]. 2 daughters live in [place], 1 son in [place] and 1 in [place].       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       Hobbies: tv, caring for cat (very important to her), change is difficult even with type of tv      Julia [name] (daughter) Julia [name] [phone]   Full Code    Past Surgical History:  Procedure Laterality Date  . ABDOMINAL HYSTERECTOMY  2009  . BILATERAL SALPINGOOPHORECTOMY    . bladder tack    . CATARACT EXTRACTION Right at least a year ago  . INCISION AND DRAINAGE OF WOUND Left 08/04/2015   Procedure:  DEBRIDEMENT DISTAL INTERPHALANGEAL LEFT INDEX FINGER;  Surgeon: Daryll Brod, MD;  Location: [place];  Service: Orthopedics;  Laterality: Left;  Marland Kitchen MASS EXCISION N/A 08/04/2015   Procedure: EXCISION MUCOID CYST;  Surgeon: Daryll Brod, MD;  Location: [place];  Service: Orthopedics;  Laterality: N/A;  . MASS EXCISION Left 11/24/2015   Procedure: EXCISION RECURRENT  MUCOID CYST LEFT INDEX  FINGER;  Surgeon: Daryll Brod, MD;  Location: [place];  Service: Orthopedics;  Laterality: Left;  . ROTATOR CUFF REPAIR     right shoulder  . surgical repair to fx left wrist    . TONSILLECTOMY      Family History  Problem Relation Age of Onset  . Hypertension Mother   . COPD Mother   . Tuberculosis Mother     x 2  . Lung cancer Father   . Stomach cancer Father   . Heart disease Brother   . Drug  abuse Maternal Uncle   . Cancer Maternal Uncle   . Alzheimer's disease Maternal Aunt   . Colon cancer Neg Hx   . Esophageal cancer Neg Hx   . Rectal cancer Neg Hx   . Heart attack Neg Hx   . Stroke Neg Hx     Allergies  Allergen Reactions  . Entex Other (See Comments)    unknown  . Sulfonamide Derivatives Other (See Comments)    unknown    Current Outpatient Prescriptions on File Prior to Visit  Medication Sig Dispense Refill  . ALPRAZolam (XANAX) 0.25 MG tablet Take 1 tablet (0.25 mg total) by mouth at bedtime as needed for anxiety. 60 tablet 0  . amLODipine (NORVASC) 5 MG tablet take 1 tablet by mouth once daily 30 tablet 5  . aspirin 81 MG tablet Take 81 mg by mouth at bedtime.     . Bismuth Subsalicylate (KAOPECTATE PO) Take by mouth. Reported on 10/17/2015    . galantamine (RAZADYNE ER) 24 MG 24 hr capsule TAKE 1 CAPSULE BY MOUTH DAILY WITH BREAKFAST 30 capsule 5  . HYDROcodone-acetaminophen (NORCO) 5-325 MG tablet Take 1 tablet by mouth every 6 (six) hours as needed for moderate pain. 20 tablet 0  . hydrOXYzine (ATARAX/VISTARIL) 25 MG tablet Take 0.5-1 tablets (12.5-25 mg total) by mouth every 8 (eight) hours as needed for itching. 30 tablet 0  . Ibuprofen 200 MG CAPS Take 1 capsule (200 mg total) by mouth daily as needed. 120 each 0  . levothyroxine (SYNTHROID, LEVOTHROID) 88 MCG tablet TAKE 1 TABLET BY MOUTH DAILY BEFORE BREAKFAST 90 tablet 2  . mirabegron ER (MYRBETRIQ) 25 MG TB24 tablet Take 25 mg by mouth daily.    . mirtazapine (REMERON) 15 MG tablet TAKE 1 TABLET BY MOUTH AT BEDTIME 30 tablet 5  . Multiple Vitamin (MULTIVITAMIN) tablet Take 1 tablet by mouth daily.    Marland Kitchen NAMENDA XR 28 MG CP24 24 hr capsule take 1 capsule by mouth daily 30 capsule 6  . Omega-3 Fatty Acids (FISH OIL) 1000 MG CAPS Take 1,000 mg by mouth daily.    . polyethylene glycol (MIRALAX / GLYCOLAX) packet Take 17 g by mouth daily. Reported on 10/17/2015    . QUEtiapine (SEROQUEL) 25 MG tablet take 1  tablet by mouth at bedtime 90 tablet 1  . rosuvastatin (CRESTOR) 20 MG tablet Take 1 tablet (20 mg total) by mouth once a week. 15 tablet 3  . sertraline (ZOLOFT) 50 MG tablet Take 1 tablet (50 mg total) by mouth  daily. 30 tablet 6  . triamcinolone cream (KENALOG) 0.1 % Apply 1 application topically 2 (two) times daily. 30 g 0   No current facility-administered medications on file prior to visit.     BP (!) 166/62 (BP Location: Left Arm, Patient Position: Sitting, Cuff Size: Normal)   Temp 97.6 F (36.4 C) (Oral)   Ht 5\' 4"  (1.626 m)   Wt 118 lb 12.8 oz (53.9 kg)   BMI 20.39 kg/m       Objective:   Physical Exam  Constitutional: She appears well-developed and well-nourished. No distress.  Cardiovascular: Normal rate, regular rhythm, normal heart sounds and intact distal pulses.  Exam reveals no gallop and no friction rub.   No murmur heard. Pulmonary/Chest: Effort normal and breath sounds normal. No respiratory distress. She has no wheezes. She has no rales. She exhibits no tenderness.  Neurological: She is alert.  Skin: Skin is warm and dry. No rash noted. She is not diaphoretic. No erythema. No pallor.  Psychiatric: She has a normal mood and affect. Her behavior is normal. Judgment and thought content normal.  Nursing note and vitals reviewed.     Assessment & Plan:  1. Alzheimer's dementia without behavioral disturbance, unspecified timing of dementia onset - Unable to verify medications she is taking  - I had Manuela Schwartz, RN do a MMSE on this patient during her AWE and she scored a 25/30.  - Spoke with her daughter and informed her that her mother drove to the appointment today. Per daughter she has spoken to both her mother and father about this. She also informed me that her mother cancelled her appointment with me ( daughter was supposed to come with her to this one) and rescheduled when her daughter would not know about the appointment.  - Stressed the importance of patient not  driving  2. Need for 23-polyvalent pneumococcal polysaccharide vaccine  - Pneumococcal polysaccharide vaccine 23-valent greater than or equal to 2yo subcutaneous/IM  Dorothyann Peng, NP
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
## 2016-10-05 ENCOUNTER — Other Ambulatory Visit: Payer: Self-pay | Admitting: Adult Health

## 2016-10-19 ENCOUNTER — Telehealth: Payer: Self-pay | Admitting: Adult Health

## 2016-10-19 NOTE — Telephone Encounter (Signed)
Daughter Marcie Bal states she is POA for her mom the pt. She is also on the [facility]. Marcie Bal states her father has been picking her pt's meds, now there is some confusion. She would like to know if you will print out pt's med list so they can get her organized.

## 2016-10-21 ENCOUNTER — Encounter: Payer: Self-pay | Admitting: Gastroenterology

## 2016-10-21 NOTE — Telephone Encounter (Signed)
Medication list has been placed up front for patient. Patient's daughter notified and verbalized understanding.

## 2016-10-28 ENCOUNTER — Emergency Department (HOSPITAL_COMMUNITY)
Admission: EM | Admit: 2016-10-28 | Discharge: 2016-10-28 | Disposition: A | Discharge status: Home / Self Care | Payer: Medicare Other | Attending: Emergency Medicine | Admitting: Emergency Medicine

## 2016-10-28 ENCOUNTER — Emergency Department (HOSPITAL_COMMUNITY): Payer: Medicare Other

## 2016-10-28 ENCOUNTER — Encounter (HOSPITAL_COMMUNITY): Payer: Self-pay | Admitting: Emergency Medicine

## 2016-10-28 DIAGNOSIS — R260 Ataxic gait: Secondary | ICD-10-CM | POA: Diagnosis not present

## 2016-10-28 DIAGNOSIS — G309 Alzheimer's disease, unspecified: Secondary | ICD-10-CM | POA: Insufficient documentation

## 2016-10-28 DIAGNOSIS — R531 Weakness: Secondary | ICD-10-CM | POA: Diagnosis present

## 2016-10-28 DIAGNOSIS — I10 Essential (primary) hypertension: Secondary | ICD-10-CM | POA: Insufficient documentation

## 2016-10-28 DIAGNOSIS — E039 Hypothyroidism, unspecified: Secondary | ICD-10-CM | POA: Diagnosis not present

## 2016-10-28 DIAGNOSIS — Z79899 Other long term (current) drug therapy: Secondary | ICD-10-CM | POA: Insufficient documentation

## 2016-10-28 DIAGNOSIS — R42 Dizziness and giddiness: Secondary | ICD-10-CM

## 2016-10-28 DIAGNOSIS — I951 Orthostatic hypotension: Secondary | ICD-10-CM | POA: Insufficient documentation

## 2016-10-28 LAB — URINALYSIS, ROUTINE W REFLEX MICROSCOPIC
Bilirubin Urine: NEGATIVE
Glucose, UA: NEGATIVE mg/dL
HGB URINE DIPSTICK: NEGATIVE
Ketones, ur: NEGATIVE mg/dL
Nitrite: NEGATIVE
PH: 6 (ref 5.0–8.0)
Protein, ur: NEGATIVE mg/dL
RBC / HPF: NONE SEEN RBC/hpf (ref 0–5)
SPECIFIC GRAVITY, URINE: 1.016 (ref 1.005–1.030)

## 2016-10-28 LAB — BASIC METABOLIC PANEL
Anion gap: 8 (ref 5–15)
BUN: 16 mg/dL (ref 6–20)
CALCIUM: 9.6 mg/dL (ref 8.9–10.3)
CO2: 25 mmol/L (ref 22–32)
CREATININE: 1.09 mg/dL — AB (ref 0.44–1.00)
Chloride: 108 mmol/L (ref 101–111)
GFR calc Af Amer: 54 mL/min — ABNORMAL LOW (ref >60)
GFR, EST NON AFRICAN AMERICAN: 47 mL/min — AB (ref >60)
GLUCOSE: 101 mg/dL — AB (ref 65–99)
POTASSIUM: 4.4 mmol/L (ref 3.5–5.1)
Sodium: 141 mmol/L (ref 135–145)

## 2016-10-28 LAB — CBC
HCT: 42.9 % (ref 36.0–46.0)
Hemoglobin: 14 g/dL (ref 12.0–15.0)
MCH: 29.3 pg (ref 26.0–34.0)
MCHC: 32.6 g/dL (ref 30.0–36.0)
MCV: 89.7 fL (ref 78.0–100.0)
PLATELETS: 201 10*3/uL (ref 150–400)
RBC: 4.78 MIL/uL (ref 3.87–5.11)
RDW: 13.3 % (ref 11.5–15.5)
WBC: 4.8 10*3/uL (ref 4.0–10.5)

## 2016-10-28 MED ORDER — SODIUM CHLORIDE 0.9 % IV BOLUS (SEPSIS)
500.0000 mL | Freq: Once | INTRAVENOUS | Status: AC
  Administered 2016-10-28: 500 mL via INTRAVENOUS

## 2016-10-28 NOTE — ED Triage Notes (Signed)
Pt reports generalized weakness x3 days, states that she just feels overly tired, denies pain, fever or any urinary symptoms. Pt a/ox4, speech clear, no neuro deficits noted.

## 2016-10-28 NOTE — ED Provider Notes (Signed)
[facility] DEPT Provider Note   CSN: [id number] Arrival date & time: 10/28/16  1406     History   Chief Complaint Chief Complaint  Patient presents with  . Weakness    HPI Julia [name] is a 82 y.o. female.  HPI Pt with hx of dementia, HTN, HL comes in with cc of dizziness and abnormal gait. Per family, since Wednesday pt has complained of getting dizzy, particularly when she gets up and they have noted that her gait is unsteady and shuffled. Pt denies any associated numbness, tingling, focal weakness, hip pain, chest pain, dib, nausea, emesis, decreased po intake, diarrhea. Pt denies any blood loss or uti like symptoms.  Past Medical History:  Diagnosis Date  . Alzheimer's dementia   . Anxiety   . Concussion with no loss of consciousness 05/14/2008   Qualifier: Diagnosis of  By: Arnoldo Morale MD, Balinda Quails   . Dementia   . GERD (gastroesophageal reflux disease)   . History of colonic polyps   . Hx of echocardiogram    Echo 4/16:  EF 55-60%, mild LVH  . Hyperlipidemia   . Hypertension   . Hypothyroidism   . Osteopenia   . Right forearm fracture 2011  . Thyroid disease    hypothyroidism    Patient Active Problem List   Diagnosis Date Noted  . Sinus bradycardia 09/05/2014  . Junctional escape rhythm 09/05/2014  . Alzheimer's dementia 04/21/2014  . Overactive bladder 04/21/2014  . Hypothyroid 11/21/2013  . PALPITATIONS, RECURRENT 12/09/2008  . Hyperlipidemia 04/18/2007  . Essential hypertension 12/11/2006  . GERD 12/11/2006  . Osteopenia 12/11/2006  . History of colonic polyps 12/11/2006    Past Surgical History:  Procedure Laterality Date  . ABDOMINAL HYSTERECTOMY  2009  . BILATERAL SALPINGOOPHORECTOMY    . bladder tack    . CATARACT EXTRACTION Right at least a year ago  . INCISION AND DRAINAGE OF WOUND Left 08/04/2015   Procedure: DEBRIDEMENT DISTAL INTERPHALANGEAL LEFT INDEX FINGER;  Surgeon: Daryll Brod, MD;  Location: [facility];  Service:  Orthopedics;  Laterality: Left;  Marland Kitchen MASS EXCISION N/A 08/04/2015   Procedure: EXCISION MUCOID CYST;  Surgeon: Daryll Brod, MD;  Location: [facility];  Service: Orthopedics;  Laterality: N/A;  . MASS EXCISION Left 11/24/2015   Procedure: EXCISION RECURRENT  MUCOID CYST LEFT INDEX  FINGER;  Surgeon: Daryll Brod, MD;  Location: [facility];  Service: Orthopedics;  Laterality: Left;  . ROTATOR CUFF REPAIR     right shoulder  . surgical repair to fx left wrist    . TONSILLECTOMY      OB History    No data available       Home Medications    Prior to Admission medications   Medication Sig Start Date End Date Taking? Authorizing Provider  ALPRAZolam (XANAX) 0.25 MG tablet Take 1 tablet (0.25 mg total) by mouth at bedtime as needed for anxiety. 02/25/14  Yes Marin Olp, MD  amLODipine (NORVASC) 5 MG tablet take 1 tablet by mouth once daily 10/05/16  Yes Nafziger, Tommi Rumps, NP  aspirin 81 MG tablet Take 81 mg by mouth at bedtime.    Yes [provider]  Bismuth Subsalicylate (KAOPECTATE PO) Take by mouth. Reported on 10/17/2015   Yes [provider]  galantamine (RAZADYNE ER) 24 MG 24 hr capsule TAKE 1 CAPSULE BY MOUTH DAILY WITH BREAKFAST 07/04/16  Yes Jaffe, Adam R, DO  Ibuprofen 200 MG CAPS Take 1 capsule (200 mg  total) by mouth daily as needed. 09/06/14  Yes Weaver, Scott T, PA-C  latanoprost (XALATAN) 0.005 % ophthalmic solution Place 1 drop into both eyes at bedtime.   Yes [provider]  levothyroxine (SYNTHROID, LEVOTHROID) 88 MCG tablet TAKE 1 TABLET BY MOUTH DAILY BEFORE BREAKFAST 03/22/16  Yes Nafziger, Tommi Rumps, NP  mirabegron ER (MYRBETRIQ) 25 MG TB24 tablet Take 25 mg by mouth daily.   Yes [provider]  mirtazapine (REMERON) 15 MG tablet TAKE 1 TABLET BY MOUTH AT BEDTIME 07/18/16  Yes Jaffe, Adam R, DO  Multiple Vitamin (MULTIVITAMIN) tablet Take 1 tablet by mouth daily.   Yes [provider]  NAMENDA XR 28 MG CP24 24  hr capsule take 1 capsule by mouth daily 05/17/16  Yes Jaffe, Adam R, DO  Omega-3 Fatty Acids (FISH OIL) 1000 MG CAPS Take 1,000 mg by mouth daily.   Yes [provider]  polyethylene glycol (MIRALAX / GLYCOLAX) packet Take 17 g by mouth daily as needed for mild constipation. Reported on 10/17/2015   Yes [provider]  QUEtiapine (SEROQUEL) 25 MG tablet take 1 tablet by mouth at bedtime 06/02/16  Yes Nafziger, Tommi Rumps, NP  rosuvastatin (CRESTOR) 20 MG tablet Take 1 tablet (20 mg total) by mouth once a week. 09/12/14  Yes Marin Olp, MD  triamcinolone cream (KENALOG) 0.1 % Apply 1 application topically 2 (two) times daily. 10/17/15  Yes Robyn Haber, MD  hydrOXYzine (ATARAX/VISTARIL) 25 MG tablet Take 0.5-1 tablets (12.5-25 mg total) by mouth every 8 (eight) hours as needed for itching. Patient not taking: Reported on 10/28/2016 10/17/15   Robyn Haber, MD  sertraline (ZOLOFT) 50 MG tablet Take 1 tablet (50 mg total) by mouth daily. Patient not taking: Reported on 10/28/2016 11/25/15   Pieter Partridge, DO    Family History Family History  Problem Relation Age of Onset  . Hypertension Mother   . COPD Mother   . Tuberculosis Mother        x 2  . Lung cancer Father   . Stomach cancer Father   . Heart disease Brother   . Drug abuse Maternal Uncle   . Cancer Maternal Uncle   . Alzheimer's disease Maternal Aunt   . Colon cancer Neg Hx   . Esophageal cancer Neg Hx   . Rectal cancer Neg Hx   . Heart attack Neg Hx   . Stroke Neg Hx     Social History Social History  Substance Use Topics  . Smoking status: Never Smoker  . Smokeless tobacco: Never Used  . Alcohol use No     Allergies   Entex and Sulfonamide derivatives   Review of Systems Review of Systems  Constitutional: Negative for activity change.  Respiratory: Negative for shortness of breath.   Cardiovascular: Negative for chest pain.  Gastrointestinal: Negative for abdominal pain, nausea and vomiting.    Genitourinary: Negative for dysuria.  Musculoskeletal: Negative for arthralgias, back pain and neck pain.  Neurological: Positive for dizziness. Negative for tremors, syncope, facial asymmetry, speech difficulty, weakness, light-headedness, numbness and headaches.  All other systems reviewed and are negative.    Physical Exam Updated Vital Signs BP (!) 162/63   Pulse (!) 55   Temp 97.6 F (36.4 C) (Oral)   Resp 18   Ht 5\' 3"  (1.6 m)   Wt 52.2 kg (115 lb)   SpO2 97%   BMI 20.37 kg/m   Physical Exam  Constitutional: She is oriented to person, place, and time.  She appears well-developed.  HENT:  Head: Normocephalic and atraumatic.  Eyes: EOM are normal.  Neck: Normal range of motion. Neck supple.  Cardiovascular: Normal rate.   Pulmonary/Chest: Effort normal.  Abdominal: Bowel sounds are normal.  Neurological: She is alert and oriented to person, place, and time. No cranial nerve deficit. Coordination normal.  Cerebellar exam is normal (finger to nose) Sensory exam normal for bilateral upper and lower extremities - and patient is able to discriminate between sharp and dull. Motor exam is 4+/5  When ambulated, pt had shuffled, gait. She was not unsteady. + Rhombergs exam.  Skin: Skin is warm and dry.  Nursing note and vitals reviewed.    ED Treatments / Results  Labs (all labs ordered are listed, but only abnormal results are displayed) Labs Reviewed  BASIC METABOLIC PANEL - Abnormal; Notable for the following:       Result Value   Glucose, Bld 101 (*)    Creatinine, Ser 1.09 (*)    GFR calc non Af Amer 47 (*)    GFR calc Af Amer 54 (*)    All other components within normal limits  URINALYSIS, ROUTINE W REFLEX MICROSCOPIC - Abnormal; Notable for the following:    APPearance HAZY (*)    Leukocytes, UA TRACE (*)    Bacteria, UA FEW (*)    Squamous Epithelial / LPF 0-5 (*)    All other components within normal limits  CBC  CBG MONITORING, ED    EKG  EKG  Interpretation  Date/Time:  Friday October 28 2016 14:14:07 EDT Ventricular Rate:  69 PR Interval:  142 QRS Duration: 80 QT Interval:  384 QTC Calculation: 411 R Axis:   63 Text Interpretation:  Sinus rhythm with marked sinus arrhythmia Septal infarct , age undetermined Abnormal ECG Septal q waves are new Confirmed by Varney Biles ([id number]) on 10/28/2016 6:15:32 PM       Radiology Mr Brain Wo Contrast  Result Date: 10/28/2016 CLINICAL DATA:  Initial evaluation for gait ataxia. EXAM: MRI HEAD WITHOUT CONTRAST TECHNIQUE: Multiplanar, multiecho pulse sequences of the brain and surrounding structures were obtained without intravenous contrast. COMPARISON:  Prior MRI from 09/05/2014. FINDINGS: Brain: Diffuse prominence of the CSF containing spaces compatible with generalized cerebral atrophy. Patchy and confluent T2/FLAIR hyperintensity within the periventricular and deep white matter both cerebral hemispheres most compatible chronic small vessel ischemic disease, moderate nature. Few scattered superimposed remote lacunar infarcts present within the periventricular white matter. Punctate 5 mm focus of diffusion signal within mean posterior aspect of the right temporal lobe noted (series 3, image 27). This is felt to be most consistent with T2 shine through, with no corresponding ADC correlate identified. No other convincing foci of restricted diffusion to suggest acute ischemic infarct identified. Gray-white matter differentiation maintained. No evidence for acute intracranial hemorrhage. Multiple punctate foci susceptibility effect seen involving the posterior aspect of the bilateral cerebral hemispheres as well as the thalami, consistent with small chronic micro hemorrhages, most likely related to chronic underlying hypertension. No mass lesion, midline shift or mass effect. Ventricular prominence related to global parenchymal volume loss without hydrocephalus. No extra-axial fluid collection. Major dural  sinuses are patent. Pituitary gland and suprasellar region within normal limits. Midline structures intact. Vascular: Major intracranial vascular flow voids are maintained. Intracranial dolichoectasia noted. Skull and upper cervical spine: Craniocervical junction within normal limits. Visualized upper cervical spine unremarkable. Bone marrow signal intensity normal. No scalp soft tissue abnormality. Sinuses/Orbits: Globes and orbital soft tissues within normal limits.  Patient status post lens extraction on the right. Mild scattered mucosal thickening within the ethmoidal air cells and maxillary sinuses. No air-fluid level to suggest acute sinusitis. No mastoid effusion. Inner ear structures grossly normal. Other: None. IMPRESSION: 1. No acute intracranial process identified. 2. Scattered punctate chronic micro hemorrhages, most likely related to chronic underlying hypertension. 3. Generalized age-related cerebral atrophy with moderate chronic small vessel ischemic disease with superimposed remote lacunar infarcts involving the periventricular white matter. Electronically Signed   By: Jeannine Boga M.D.   On: 10/28/2016 19:09    Procedures Procedures (including critical care time)  Medications Ordered in ED Medications  sodium chloride 0.9 % bolus 500 mL (0 mLs Intravenous Stopped 10/28/16 1757)     Initial Impression / Assessment and Plan / ED Course  I have reviewed the triage vital signs and the nursing notes.  Pertinent labs & imaging results that were available during my care of the patient were reviewed by me and considered in my medical decision making (see chart for details).  Clinical Course as of Oct 29 1951  Fri Oct 28, 2016  1950 Results from the ER workup discussed with the patient face to face and all questions answered to the best of my ability.   [AN]    Clinical Course User Index [AN] Varney Biles, MD      Final Clinical Impressions(s) / ED Diagnoses   Final  diagnoses:  Orthostatic dizziness  Ataxic gait    Pt comes in with sudden change in gait and dizziness. It seems like pt has ataxic gait and sequelae of her chronic neuro disease. It doest appear that there is any new meds contributing. Also doesn't seem like there is a new structural problem to the bones.  With sudden change in gait, we of course are considering stroke in the ER. MRI ordered. If neg, we will advise close pcp f/u. PT might need PT and might even benefit from neuro eval.  We will check orthostatics, but she feels dizzy when getting up, so we will give her 500 cc bolus while she is awaiting workup.  New Prescriptions Current Discharge Medication List       Varney Biles, MD 10/28/16 1958

## 2016-10-28 NOTE — Discharge Instructions (Signed)
Please be extremely careful when walking. The results from ER dont reveal ant stroke, which is reassuring. The walk abnormality will need further evaluation by your primary doctor, and possibly neurologist- so see them as soon as possible.  Additionally, ask about physical therapy for the walk, and ask if you need a cane / walker etc.

## 2016-10-28 NOTE — ED Notes (Signed)
On way to MRI 

## 2016-11-07 ENCOUNTER — Encounter (HOSPITAL_COMMUNITY): Payer: Self-pay | Admitting: Emergency Medicine

## 2016-11-07 ENCOUNTER — Inpatient Hospital Stay (HOSPITAL_COMMUNITY): Payer: Medicare Other

## 2016-11-07 ENCOUNTER — Emergency Department (HOSPITAL_COMMUNITY): Payer: Medicare Other

## 2016-11-07 ENCOUNTER — Encounter (HOSPITAL_COMMUNITY)
Admission: EM | Disposition: A | Discharge status: Skilled Nursing Facility | Payer: Self-pay | Source: Home / Self Care | Attending: Nephrology

## 2016-11-07 ENCOUNTER — Inpatient Hospital Stay (HOSPITAL_COMMUNITY): Payer: Medicare Other | Admitting: Anesthesiology

## 2016-11-07 ENCOUNTER — Inpatient Hospital Stay (HOSPITAL_COMMUNITY)
Admission: EM | Admit: 2016-11-07 | Discharge: 2016-11-08 | DRG: 482 | Disposition: A | Discharge status: Skilled Nursing Facility | Payer: Medicare Other | Attending: Nephrology | Admitting: Nephrology

## 2016-11-07 DIAGNOSIS — E785 Hyperlipidemia, unspecified: Secondary | ICD-10-CM | POA: Diagnosis present

## 2016-11-07 DIAGNOSIS — Z8249 Family history of ischemic heart disease and other diseases of the circulatory system: Secondary | ICD-10-CM | POA: Diagnosis not present

## 2016-11-07 DIAGNOSIS — Z7982 Long term (current) use of aspirin: Secondary | ICD-10-CM | POA: Diagnosis not present

## 2016-11-07 DIAGNOSIS — F0280 Dementia in other diseases classified elsewhere without behavioral disturbance: Secondary | ICD-10-CM | POA: Diagnosis present

## 2016-11-07 DIAGNOSIS — M810 Age-related osteoporosis without current pathological fracture: Secondary | ICD-10-CM | POA: Diagnosis present

## 2016-11-07 DIAGNOSIS — E039 Hypothyroidism, unspecified: Secondary | ICD-10-CM | POA: Diagnosis present

## 2016-11-07 DIAGNOSIS — Z419 Encounter for procedure for purposes other than remedying health state, unspecified: Secondary | ICD-10-CM

## 2016-11-07 DIAGNOSIS — Z66 Do not resuscitate: Secondary | ICD-10-CM | POA: Diagnosis present

## 2016-11-07 DIAGNOSIS — Z882 Allergy status to sulfonamides status: Secondary | ICD-10-CM | POA: Diagnosis not present

## 2016-11-07 DIAGNOSIS — Z888 Allergy status to other drugs, medicaments and biological substances status: Secondary | ICD-10-CM | POA: Diagnosis not present

## 2016-11-07 DIAGNOSIS — Z800 Family history of malignant neoplasm of digestive organs: Secondary | ICD-10-CM

## 2016-11-07 DIAGNOSIS — S72044A Nondisplaced fracture of base of neck of right femur, initial encounter for closed fracture: Principal | ICD-10-CM | POA: Diagnosis present

## 2016-11-07 DIAGNOSIS — S72001A Fracture of unspecified part of neck of right femur, initial encounter for closed fracture: Secondary | ICD-10-CM | POA: Diagnosis present

## 2016-11-07 DIAGNOSIS — W010XXA Fall on same level from slipping, tripping and stumbling without subsequent striking against object, initial encounter: Secondary | ICD-10-CM | POA: Diagnosis present

## 2016-11-07 DIAGNOSIS — G309 Alzheimer's disease, unspecified: Secondary | ICD-10-CM | POA: Diagnosis present

## 2016-11-07 DIAGNOSIS — Z801 Family history of malignant neoplasm of trachea, bronchus and lung: Secondary | ICD-10-CM

## 2016-11-07 DIAGNOSIS — I10 Essential (primary) hypertension: Secondary | ICD-10-CM | POA: Diagnosis present

## 2016-11-07 HISTORY — PX: HIP PINNING,CANNULATED: SHX1758

## 2016-11-07 LAB — BASIC METABOLIC PANEL
Anion gap: 7 (ref 5–15)
BUN: 14 mg/dL (ref 6–20)
CALCIUM: 9.2 mg/dL (ref 8.9–10.3)
CO2: 28 mmol/L (ref 22–32)
CREATININE: 1.17 mg/dL — AB (ref 0.44–1.00)
Chloride: 103 mmol/L (ref 101–111)
GFR calc non Af Amer: 43 mL/min — ABNORMAL LOW (ref >60)
GFR, EST AFRICAN AMERICAN: 50 mL/min — AB (ref >60)
Glucose, Bld: 115 mg/dL — ABNORMAL HIGH (ref 65–99)
Potassium: 3.7 mmol/L (ref 3.5–5.1)
SODIUM: 138 mmol/L (ref 135–145)

## 2016-11-07 LAB — COMPREHENSIVE METABOLIC PANEL
ALBUMIN: 4 g/dL (ref 3.5–5.0)
ALK PHOS: 57 U/L (ref 38–126)
ALT: 17 U/L (ref 14–54)
AST: 31 U/L (ref 15–41)
Anion gap: 8 (ref 5–15)
BUN: 16 mg/dL (ref 6–20)
CALCIUM: 9.3 mg/dL (ref 8.9–10.3)
CHLORIDE: 104 mmol/L (ref 101–111)
CO2: 27 mmol/L (ref 22–32)
Creatinine, Ser: 1.23 mg/dL — ABNORMAL HIGH (ref 0.44–1.00)
GFR calc Af Amer: 47 mL/min — ABNORMAL LOW (ref >60)
GFR calc non Af Amer: 41 mL/min — ABNORMAL LOW (ref >60)
GLUCOSE: 118 mg/dL — AB (ref 65–99)
POTASSIUM: 3.8 mmol/L (ref 3.5–5.1)
SODIUM: 139 mmol/L (ref 135–145)
Total Bilirubin: 1.1 mg/dL (ref 0.3–1.2)
Total Protein: 6.3 g/dL — ABNORMAL LOW (ref 6.5–8.1)

## 2016-11-07 LAB — CBC
HCT: 45.6 % (ref 36.0–46.0)
Hemoglobin: 14.7 g/dL (ref 12.0–15.0)
MCH: 29.1 pg (ref 26.0–34.0)
MCHC: 32.2 g/dL (ref 30.0–36.0)
MCV: 90.1 fL (ref 78.0–100.0)
Platelets: 197 10*3/uL (ref 150–400)
RBC: 5.06 MIL/uL (ref 3.87–5.11)
RDW: 13.7 % (ref 11.5–15.5)
WBC: 6.1 10*3/uL (ref 4.0–10.5)

## 2016-11-07 LAB — CBC WITH DIFFERENTIAL/PLATELET
BASOS PCT: 0 %
Basophils Absolute: 0 10*3/uL (ref 0.0–0.1)
EOS ABS: 0.1 10*3/uL (ref 0.0–0.7)
Eosinophils Relative: 1 %
HCT: 43.4 % (ref 36.0–46.0)
HEMOGLOBIN: 14.4 g/dL (ref 12.0–15.0)
Lymphocytes Relative: 16 %
Lymphs Abs: 1.2 10*3/uL (ref 0.7–4.0)
MCH: 29.6 pg (ref 26.0–34.0)
MCHC: 33.2 g/dL (ref 30.0–36.0)
MCV: 89.1 fL (ref 78.0–100.0)
MONOS PCT: 9 %
Monocytes Absolute: 0.7 10*3/uL (ref 0.1–1.0)
NEUTROS PCT: 74 %
Neutro Abs: 5.5 10*3/uL (ref 1.7–7.7)
PLATELETS: 199 10*3/uL (ref 150–400)
RBC: 4.87 MIL/uL (ref 3.87–5.11)
RDW: 13.4 % (ref 11.5–15.5)
WBC: 7.5 10*3/uL (ref 4.0–10.5)

## 2016-11-07 LAB — SURGICAL PCR SCREEN
MRSA, PCR: NEGATIVE
Staphylococcus aureus: NEGATIVE

## 2016-11-07 SURGERY — FIXATION, FEMUR, NECK, PERCUTANEOUS, USING SCREW
Anesthesia: Spinal | Site: Hip | Laterality: Right

## 2016-11-07 MED ORDER — DOCUSATE SODIUM 100 MG PO CAPS
100.0000 mg | ORAL_CAPSULE | Freq: Two times a day (BID) | ORAL | Status: DC
  Administered 2016-11-07 – 2016-11-08 (×2): 100 mg via ORAL
  Filled 2016-11-07 (×2): qty 1

## 2016-11-07 MED ORDER — CEFAZOLIN SODIUM-DEXTROSE 2-4 GM/100ML-% IV SOLN
2.0000 g | Freq: Once | INTRAVENOUS | Status: AC
  Administered 2016-11-07: 2 g via INTRAVENOUS
  Filled 2016-11-07: qty 100

## 2016-11-07 MED ORDER — CEFAZOLIN SODIUM-DEXTROSE 2-4 GM/100ML-% IV SOLN
INTRAVENOUS | Status: AC
  Filled 2016-11-07: qty 100

## 2016-11-07 MED ORDER — FENTANYL CITRATE (PF) 100 MCG/2ML IJ SOLN: 25.0000 ug | INTRAMUSCULAR | Status: DC | PRN

## 2016-11-07 MED ORDER — CHLORHEXIDINE GLUCONATE 4 % EX LIQD: 60.0000 mL | Freq: Once | CUTANEOUS | Status: DC

## 2016-11-07 MED ORDER — SODIUM CHLORIDE 0.9 % IV SOLN
INTRAVENOUS | Status: DC
  Administered 2016-11-07: 06:00:00 via INTRAVENOUS

## 2016-11-07 MED ORDER — LACTATED RINGERS IV SOLN
INTRAVENOUS | Status: DC
Start: 2016-11-07 — End: 2016-11-07
  Administered 2016-11-07: 14:00:00 via INTRAVENOUS

## 2016-11-07 MED ORDER — CEFAZOLIN SODIUM-DEXTROSE 2-4 GM/100ML-% IV SOLN
2.0000 g | INTRAVENOUS | Status: AC
  Administered 2016-11-07: 2 g via INTRAVENOUS

## 2016-11-07 MED ORDER — ONDANSETRON HCL 4 MG/2ML IJ SOLN
INTRAMUSCULAR | Status: AC
  Filled 2016-11-07: qty 2

## 2016-11-07 MED ORDER — MIRABEGRON ER 25 MG PO TB24
25.0000 mg | ORAL_TABLET | Freq: Every day | ORAL | Status: DC
  Administered 2016-11-07 – 2016-11-08 (×2): 25 mg via ORAL
  Filled 2016-11-07 (×2): qty 1

## 2016-11-07 MED ORDER — PROPOFOL 10 MG/ML IV BOLUS
INTRAVENOUS | Status: DC | PRN
  Administered 2016-11-07: 20 mg via INTRAVENOUS

## 2016-11-07 MED ORDER — PHENYLEPHRINE 40 MCG/ML (10ML) SYRINGE FOR IV PUSH (FOR BLOOD PRESSURE SUPPORT)
PREFILLED_SYRINGE | INTRAVENOUS | Status: AC
  Filled 2016-11-07: qty 10

## 2016-11-07 MED ORDER — 0.9 % SODIUM CHLORIDE (POUR BTL) OPTIME
TOPICAL | Status: DC | PRN
  Administered 2016-11-07: 1000 mL

## 2016-11-07 MED ORDER — MIRTAZAPINE 15 MG PO TABS
15.0000 mg | ORAL_TABLET | Freq: Every day | ORAL | Status: DC
  Administered 2016-11-07: 15 mg via ORAL
  Filled 2016-11-07: qty 1

## 2016-11-07 MED ORDER — LEVOTHYROXINE SODIUM 88 MCG PO TABS
88.0000 ug | ORAL_TABLET | Freq: Every day | ORAL | Status: DC
  Administered 2016-11-07 – 2016-11-08 (×2): 88 ug via ORAL
  Filled 2016-11-07 (×2): qty 1

## 2016-11-07 MED ORDER — BISACODYL 10 MG RE SUPP: 10.0000 mg | Freq: Every day | RECTAL | Status: DC | PRN

## 2016-11-07 MED ORDER — ASPIRIN EC 325 MG PO TBEC: 325.0000 mg | DELAYED_RELEASE_TABLET | Freq: Every day | ORAL | 0 refills | Status: DC

## 2016-11-07 MED ORDER — HYDROCODONE-ACETAMINOPHEN 5-325 MG PO TABS: 1.0000 | ORAL_TABLET | Freq: Four times a day (QID) | ORAL | 0 refills | Status: DC | PRN

## 2016-11-07 MED ORDER — FENTANYL CITRATE (PF) 250 MCG/5ML IJ SOLN
INTRAMUSCULAR | Status: AC
  Filled 2016-11-07: qty 5

## 2016-11-07 MED ORDER — FENTANYL CITRATE (PF) 100 MCG/2ML IJ SOLN
INTRAMUSCULAR | Status: DC | PRN
Start: 2016-11-07 — End: 2016-11-07
  Administered 2016-11-07: 50 ug via INTRAVENOUS

## 2016-11-07 MED ORDER — MEMANTINE HCL ER 28 MG PO CP24
28.0000 mg | ORAL_CAPSULE | Freq: Every day | ORAL | Status: DC
  Administered 2016-11-07 – 2016-11-08 (×2): 28 mg via ORAL
  Filled 2016-11-07 (×3): qty 1

## 2016-11-07 MED ORDER — DOCUSATE SODIUM 100 MG PO CAPS: 100.0000 mg | ORAL_CAPSULE | Freq: Two times a day (BID) | ORAL | 0 refills | Status: DC

## 2016-11-07 MED ORDER — LACTATED RINGERS IV SOLN
INTRAVENOUS | Status: DC
  Administered 2016-11-08: via INTRAVENOUS

## 2016-11-07 MED ORDER — ACETAMINOPHEN 500 MG PO TABS
1000.0000 mg | ORAL_TABLET | Freq: Once | ORAL | Status: AC
  Administered 2016-11-07: 1000 mg via ORAL

## 2016-11-07 MED ORDER — ASPIRIN EC 81 MG PO TBEC: 81.0000 mg | DELAYED_RELEASE_TABLET | Freq: Every day | ORAL | Status: DC

## 2016-11-07 MED ORDER — OMEPRAZOLE 20 MG PO CPDR: 20.0000 mg | DELAYED_RELEASE_CAPSULE | Freq: Every day | ORAL | 0 refills | Status: DC

## 2016-11-07 MED ORDER — ROSUVASTATIN CALCIUM 10 MG PO TABS
20.0000 mg | ORAL_TABLET | ORAL | Status: DC
  Administered 2016-11-07: 20 mg via ORAL
  Filled 2016-11-07 (×2): qty 2

## 2016-11-07 MED ORDER — HYDROCODONE-ACETAMINOPHEN 5-325 MG PO TABS: 1.0000 | ORAL_TABLET | Freq: Four times a day (QID) | ORAL | Status: DC | PRN

## 2016-11-07 MED ORDER — ACETAMINOPHEN 500 MG PO TABS
ORAL_TABLET | ORAL | Status: AC
  Administered 2016-11-07: 1000 mg via ORAL
  Filled 2016-11-07: qty 2

## 2016-11-07 MED ORDER — LATANOPROST 0.005 % OP SOLN
1.0000 [drp] | Freq: Every day | OPHTHALMIC | Status: DC
  Administered 2016-11-07: 1 [drp] via OPHTHALMIC
  Filled 2016-11-07: qty 2.5

## 2016-11-07 MED ORDER — MORPHINE SULFATE (PF) 4 MG/ML IV SOLN: 0.5000 mg | INTRAVENOUS | Status: DC | PRN

## 2016-11-07 MED ORDER — BUPIVACAINE IN DEXTROSE 0.75-8.25 % IT SOLN
INTRATHECAL | Status: DC | PRN
  Administered 2016-11-07: 1.8 mL via INTRATHECAL

## 2016-11-07 MED ORDER — DEXTROSE 5 % IV SOLN
INTRAVENOUS | Status: DC | PRN
  Administered 2016-11-07: 20 ug/min via INTRAVENOUS

## 2016-11-07 MED ORDER — GALANTAMINE HYDROBROMIDE ER 8 MG PO CP24
24.0000 mg | ORAL_CAPSULE | Freq: Every day | ORAL | Status: DC
  Administered 2016-11-08: 24 mg via ORAL
  Filled 2016-11-07 (×2): qty 3

## 2016-11-07 MED ORDER — ONDANSETRON HCL 4 MG/2ML IJ SOLN
INTRAMUSCULAR | Status: DC | PRN
  Administered 2016-11-07: 4 mg via INTRAVENOUS

## 2016-11-07 MED ORDER — PHENYLEPHRINE HCL 10 MG/ML IJ SOLN
INTRAMUSCULAR | Status: DC | PRN
  Administered 2016-11-07 (×4): 80 ug via INTRAVENOUS

## 2016-11-07 MED ORDER — QUETIAPINE FUMARATE 25 MG PO TABS
25.0000 mg | ORAL_TABLET | Freq: Every day | ORAL | Status: DC
  Administered 2016-11-07: 25 mg via ORAL
  Filled 2016-11-07 (×2): qty 1

## 2016-11-07 MED ORDER — ASPIRIN EC 325 MG PO TBEC
325.0000 mg | DELAYED_RELEASE_TABLET | Freq: Every day | ORAL | Status: DC
  Administered 2016-11-08: 325 mg via ORAL
  Filled 2016-11-07: qty 1

## 2016-11-07 MED ORDER — AMLODIPINE BESYLATE 5 MG PO TABS
5.0000 mg | ORAL_TABLET | Freq: Every day | ORAL | Status: DC
  Administered 2016-11-07 – 2016-11-08 (×2): 5 mg via ORAL
  Filled 2016-11-07 (×2): qty 1

## 2016-11-07 MED ORDER — POLYETHYLENE GLYCOL 3350 17 G PO PACK: 17.0000 g | PACK | Freq: Every day | ORAL | Status: DC | PRN

## 2016-11-07 MED ORDER — PROPOFOL 500 MG/50ML IV EMUL
INTRAVENOUS | Status: DC | PRN
  Administered 2016-11-07: 40 ug/kg/min via INTRAVENOUS

## 2016-11-07 SURGICAL SUPPLY — 35 items
BIT DRILL 6.5 CANNULATED (BIT) ×1
BIT DRILL CANNULATED 6.5 (BIT) IMPLANT
BNDG COHESIVE 4X5 TAN STRL (GAUZE/BANDAGES/DRESSINGS) ×3 IMPLANT
BNDG GAUZE ELAST 4 BULKY (GAUZE/BANDAGES/DRESSINGS) ×3 IMPLANT
CLOSURE STERI-STRIP 1/2X4 (GAUZE/BANDAGES/DRESSINGS) ×1
CLSR STERI-STRIP ANTIMIC 1/2X4 (GAUZE/BANDAGES/DRESSINGS) ×1 IMPLANT
COVER PERINEAL POST (MISCELLANEOUS) ×3 IMPLANT
COVER SURGICAL LIGHT HANDLE (MISCELLANEOUS) ×3 IMPLANT
DRAPE STERI IOBAN 125X83 (DRAPES) ×3 IMPLANT
DRILL BIT CANNULATED 6.5 (BIT) ×3
DRSG MEPILEX BORDER 4X4 (GAUZE/BANDAGES/DRESSINGS) ×3 IMPLANT
DURAPREP 26ML APPLICATOR (WOUND CARE) ×3 IMPLANT
ELECT REM PT RETURN 9FT ADLT (ELECTROSURGICAL) ×3
ELECTRODE REM PT RTRN 9FT ADLT (ELECTROSURGICAL) ×1 IMPLANT
GLOVE BIO SURGEON STRL SZ7.5 (GLOVE) ×6 IMPLANT
GLOVE BIOGEL PI IND STRL 8 (GLOVE) ×2 IMPLANT
GLOVE BIOGEL PI INDICATOR 8 (GLOVE) ×4
GOWN STRL REUS W/ TWL LRG LVL3 (GOWN DISPOSABLE) ×3 IMPLANT
GOWN STRL REUS W/TWL LRG LVL3 (GOWN DISPOSABLE) ×9
GUIDEWIRE ASNIS 3.2 NONCAL (WIRE) ×6 IMPLANT
KIT BASIN OR (CUSTOM PROCEDURE TRAY) ×3 IMPLANT
KIT ROOM TURNOVER OR (KITS) ×3 IMPLANT
MANIFOLD NEPTUNE II (INSTRUMENTS) ×3 IMPLANT
NS IRRIG 1000ML POUR BTL (IV SOLUTION) ×3 IMPLANT
PACK GENERAL/GYN (CUSTOM PROCEDURE TRAY) ×3 IMPLANT
PAD ARMBOARD 7.5X6 YLW CONV (MISCELLANEOUS) ×6 IMPLANT
SCREW ASNIS 85MM (Screw) ×2 IMPLANT
SCREW ASNIS 90MM (Screw) ×4 IMPLANT
SUT MNCRL AB 4-0 PS2 18 (SUTURE) ×2 IMPLANT
SUT MON AB 2-0 CT1 36 (SUTURE) ×3 IMPLANT
SUT VIC AB 0 CT1 27 (SUTURE)
SUT VIC AB 0 CT1 27XBRD ANBCTR (SUTURE) IMPLANT
TOWEL OR 17X24 6PK STRL BLUE (TOWEL DISPOSABLE) ×3 IMPLANT
TOWEL OR 17X26 10 PK STRL BLUE (TOWEL DISPOSABLE) ×3 IMPLANT
WATER STERILE IRR 1000ML POUR (IV SOLUTION) ×3 IMPLANT

## 2016-11-07 NOTE — Anesthesia Preprocedure Evaluation (Addendum)
Anesthesia Evaluation  Patient identified by MRN, date of birth, ID band Patient awake    Reviewed: Allergy & Precautions, H&P , NPO status , Patient's Chart, lab work & pertinent test results  Airway Mallampati: II  TM Distance: >3 FB Neck ROM: Full    Dental no notable dental hx. (+) Teeth Intact, Dental Advisory Given   Pulmonary neg pulmonary ROS,    Pulmonary exam normal breath sounds clear to auscultation       Cardiovascular hypertension, Pt. on medications  Rhythm:Regular Rate:Normal     Neuro/Psych Anxiety Dementia negative neurological ROS  negative psych ROS   GI/Hepatic negative GI ROS, Neg liver ROS,   Endo/Other  Hypothyroidism   Renal/GU negative Renal ROS  negative genitourinary   Musculoskeletal   Abdominal   Peds  Hematology negative hematology ROS (+)   Anesthesia Other Findings   Reproductive/Obstetrics negative OB ROS                            Anesthesia Physical Anesthesia Plan  ASA: III  Anesthesia Plan: Spinal   Post-op Pain Management:    Induction: Intravenous  PONV Risk Score and Plan: 3 and Ondansetron, Dexamethasone, Propofol and Treatment may vary due to age or medical condition  Airway Management Planned: Simple Face Mask  Additional Equipment:   Intra-op Plan:   Post-operative Plan:   Informed Consent: I have reviewed the patients History and Physical, chart, labs and discussed the procedure including the risks, benefits and alternatives for the proposed anesthesia with the patient or authorized representative who has indicated his/her understanding and acceptance.   Dental advisory given  Plan Discussed with: CRNA  Anesthesia Plan Comments:        Anesthesia Quick Evaluation

## 2016-11-07 NOTE — Interval H&P Note (Signed)
History and Physical Interval Note:  11/07/2016 1:38 PM  Julia [name]  has presented today for surgery, with the diagnosis of Right Hip Fracture  The various methods of treatment have been discussed with the patient and family. After consideration of risks, benefits and other options for treatment, the patient has consented to  Procedure(s): CANNULATED HIP PINNING (Right) as a surgical intervention .  The patient's history has been reviewed, patient examined, no change in status, stable for surgery.  I have reviewed the patient's chart and labs.  Questions were answered to the patient's satisfaction.     MURPHY, TIMOTHY D

## 2016-11-07 NOTE — Op Note (Signed)
11/07/2016  4:16 PM  PATIENT:  Julia [name]    PRE-OPERATIVE DIAGNOSIS:  Right Hip Fracture  POST-OPERATIVE DIAGNOSIS:  Same  PROCEDURE:  RIGHT CANNULATED HIP PINNING  SURGEON:  Kenleigh Toback, Ernesta Amble, MD  ASSISTANT: Roxan Hockey, PA-C, he was present and scrubbed throughout the case, critical for completion in a timely fashion, and for retraction, instrumentation, and closure.   ANESTHESIA:   General  PREOPERATIVE INDICATIONS:  Julia [name] is a  82 y.o. female who fell and was found to have a diagnosis of Right Hip Fracture who elected for surgical management.    The risks benefits and alternatives were discussed with the patient preoperatively including but not limited to the risks of infection, bleeding, nerve injury, cardiopulmonary complications, blood clots, malunion, nonunion, avascular necrosis, the need for revision surgery, the potential for conversion to hemiarthroplasty, among others, and the patient was willing to proceed.  OPERATIVE IMPLANTS: 6.5 mm cannulated screws x3  OPERATIVE FINDINGS: Clinical osteoporosis with weak bone, proximal femur  OPERATIVE PROCEDURE: The patient was brought to the operating room and placed in supine position. IV antibiotics were given. General anesthesia administered. Foley was also given. The patient was placed on the fracture table. The operative extremity was positioned, without any significant reduction maneuver and was prepped and draped in usual sterile fashion.  Time out was performed.  Small incisions were made distal to the greater trochanter, and 3 guidewires were introduced Into an inverted triangle configuration. The lengths were measured. The reduction was slightly valgus, and near-anatomic. I opened the cortex with a cannulated drill, and then placed the screws into position. Satisfactory fixation was achieved. I sequentially tightened the screws by hand.  I performed a live fluoroscopic exam and no screw penetrance  was noted. All threads crossed the fracture site.   The wounds were irrigated copiously, and repaired with Vicryl with Steri-Strips and sterile gauze. There no complications and the patient tolerated the procedure well.  The patient will be weightbearing as tolerated, VTE prophylaxis will be: ambulation and chemical px

## 2016-11-07 NOTE — Anesthesia Procedure Notes (Signed)
Spinal  Patient location during procedure: OR Start time: 11/07/2016 3:49 PM End time: 11/07/2016 3:49 PM Staffing Anesthesiologist: Jaielle Dlouhy, Iona Beard Performed: anesthesiologist  Preanesthetic Checklist Completed: patient identified, site marked, surgical consent, pre-op evaluation, timeout performed, IV checked, risks and benefits discussed and monitors and equipment checked Spinal Block Patient position: sitting Prep: Betadine Patient monitoring: heart rate, continuous pulse ox and blood pressure Injection technique: single-shot Needle Needle type: Spinocan  Needle gauge: 22 G Needle length: 9 cm Additional Notes Expiration date of kit checked and confirmed. Patient tolerated procedure well, without complications.

## 2016-11-07 NOTE — H&P (Signed)
History and Physical  Patient Name: Julia [name]     RJJ:[id number]    DOB: 27-May-1935    DOA: 11/07/2016 Referring provider: Junius Creamer, PA-C PCP: Dorothyann Peng, NP  Patient coming from: Home     Chief Complaint: Hip pain and fall  HPI: Julia [name] is a 82 y.o. female with a past medical history significant for hypothyroidism and HTN who presents with hip pain after a fall.  The patient was in her normal state of health until this evening she got up from a nap, walked into the bathroom, got dizzy and "sat myself down on the floor" to keep from falling.  She had immediate RIGHT hip pain after that and could not walk.    ED course: -Afebrile, heart rate 77, respirations pulse ox normal, blood pressure 142/61 -Sodium 139, potassium 3.8, creatinine 1.23 (baseline creatinine 1.1) LFTs normal, WBC 7.5K, hemoglobin 14.4 -Radiograph of the pelvis showed femoral neck fracture of the right -Case was discussed with Dr. Percell Miller from orthopedics recommended patient be made nothing by mouth for surgery this afternoon and admitted to the hospitalist service    There was no syncope.  There was no preceding chest discomfort, palpitations, or dyspnea, nor are there any of those symptoms now.  The patient denies active heart issues, angina, or history of MI. She can typically exert to an equivalent of 4 METS without dyspnea.  She denies history of TIAs/CVAs, CAD, CHF, or DM treated with insulin. She has no history of COPD or symptoms of OSA, including snoring, daytime drowsiness, apnea.  She has no history of prolonged steroid use and does not use insulin.  She does not use alcohol, and has no history of withdrawal symptoms or delirium in the context of previous surgeries.  Anesthesia Specific concerns: Presence of loose teeth: None Anesthesia problems in past: None History of bleeding disorder: None  Review of Systems:  Review of Systems  Musculoskeletal: Positive for joint pain.  All  other systems reviewed and are negative.         Past Medical History:  Diagnosis Date  . Alzheimer's dementia   . Anxiety   . Concussion with no loss of consciousness 05/14/2008   Qualifier: Diagnosis of  By: Arnoldo Morale MD, Balinda Quails   . Dementia   . GERD (gastroesophageal reflux disease)   . History of colonic polyps   . Hx of echocardiogram    Echo 4/16:  EF 55-60%, mild LVH  . Hyperlipidemia   . Hypertension   . Hypothyroidism   . Osteopenia   . Right forearm fracture 2011  . Thyroid disease    hypothyroidism    Past Surgical History:  Procedure Laterality Date  . ABDOMINAL HYSTERECTOMY  2009  . BILATERAL SALPINGOOPHORECTOMY    . bladder tack    . CATARACT EXTRACTION Right at least a year ago  . INCISION AND DRAINAGE OF WOUND Left 08/04/2015   Procedure: DEBRIDEMENT DISTAL INTERPHALANGEAL LEFT INDEX FINGER;  Surgeon: Daryll Brod, MD;  Location: [facility];  Service: Orthopedics;  Laterality: Left;  Marland Kitchen MASS EXCISION N/A 08/04/2015   Procedure: EXCISION MUCOID CYST;  Surgeon: Daryll Brod, MD;  Location: [place];  Service: Orthopedics;  Laterality: N/A;  . MASS EXCISION Left 11/24/2015   Procedure: EXCISION RECURRENT  MUCOID CYST LEFT INDEX  FINGER;  Surgeon: Daryll Brod, MD;  Location: [place];  Service: Orthopedics;  Laterality: Left;  . ROTATOR CUFF REPAIR     right  shoulder  . surgical repair to fx left wrist    . TONSILLECTOMY      Social History: Patient lives with her husband.  Patient walks unassisted.  She has mild dementia.  She lives in [place].  She used to work at [employer], and later at [employer].  Never smoked.  Two daughters.  Allergies  Allergen Reactions  . Entex Other (See Comments)    unknown  . Sulfonamide Derivatives Other (See Comments)    unknown    Family history: family history includes Alzheimer's disease in her maternal aunt; COPD in her mother; Cancer in her maternal uncle; Drug abuse  in her maternal uncle; Heart disease in her brother; Hypertension in her mother; Lung cancer in her father; Stomach cancer in her father; Tuberculosis in her mother.  Prior to Admission medications   Medication Sig Start Date End Date Taking? Authorizing Provider  amLODipine (NORVASC) 5 MG tablet take 1 tablet by mouth once daily 10/05/16  Yes Nafziger, Tommi Rumps, NP  aspirin 81 MG tablet Take 81 mg by mouth at bedtime.    Yes [provider]  galantamine (RAZADYNE ER) 24 MG 24 hr capsule TAKE 1 CAPSULE BY MOUTH DAILY WITH BREAKFAST 07/04/16  Yes Jaffe, Adam R, DO  latanoprost (XALATAN) 0.005 % ophthalmic solution Place 1 drop into both eyes at bedtime.   Yes [provider]  levothyroxine (SYNTHROID, LEVOTHROID) 88 MCG tablet TAKE 1 TABLET BY MOUTH DAILY BEFORE BREAKFAST 03/22/16  Yes Nafziger, Tommi Rumps, NP  mirabegron ER (MYRBETRIQ) 25 MG TB24 tablet Take 25 mg by mouth daily.   Yes [provider]  mirtazapine (REMERON) 15 MG tablet TAKE 1 TABLET BY MOUTH AT BEDTIME 07/18/16  Yes Pieter Partridge, DO  NAMENDA XR 28 MG CP24 24 hr capsule take 1 capsule by mouth daily 05/17/16  Yes Jaffe, Adam R, DO  QUEtiapine (SEROQUEL) 25 MG tablet take 1 tablet by mouth at bedtime 06/02/16  Yes Nafziger, Tommi Rumps, NP  rosuvastatin (CRESTOR) 20 MG tablet Take 1 tablet (20 mg total) by mouth once a week. 09/12/14  Yes Marin Olp, MD  Bismuth Subsalicylate (KAOPECTATE PO) Take by mouth. Reported on 10/17/2015    [provider]  Ibuprofen 200 MG CAPS Take 1 capsule (200 mg total) by mouth daily as needed. 09/06/14   Richardson Dopp T, PA-C  Multiple Vitamin (MULTIVITAMIN) tablet Take 1 tablet by mouth daily.    [provider]  Omega-3 Fatty Acids (FISH OIL) 1000 MG CAPS Take 1,000 mg by mouth daily.    [provider]  polyethylene glycol (MIRALAX / GLYCOLAX) packet Take 17 g by mouth daily as needed for mild constipation. Reported on 10/17/2015    [provider]    triamcinolone cream (KENALOG) 0.1 % Apply 1 application topically 2 (two) times daily. 10/17/15   Robyn Haber, MD       Physical Exam: BP 133/61   Pulse 65   Temp 98.2 F (36.8 C) (Oral)   Resp 15   SpO2 96%  General appearance: Well-developed, thin elderly adult female, alert and in no acute distress.   Eyes: Anicteric, conjunctiva pink, lids and lashes normal. PERRL.    ENT: No nasal deformity, discharge, epistaxis.  Hearing normal. OP moist without lesions.  Dentition good.    Lymph: No cervical, supraclavicular or axillary lymphadenopathy. Skin: Warm and dry.  No jaundice.  No suspicious rashes or lesions. Cardiac: RRR, nl S1-S2, no murmurs appreciated.  Capillary refill is brisk.  JVP normal.  No LE edema.  Radial and DP pulses 2+ and symmetric.  No carotid bruits. Respiratory: Normal respiratory rate and rhythm.  CTAB without rales or wheezes. GI: Abdomen soft without rigidity.  No TTP. No ascites, distension, no hepatosplenomegaly.  MSK: No leg deformities.  No effusions.  No clubbing or cyanosis. Neuro: Cranial nerves 3-12 intact.  Sensorium intact and responding to questions, attention normal.  Speech is fluent.  Moves all extremities equally and with normal coordination.    Psych: The patient is oriented to time, place and person. Behavior appropriate.  Affect pleasatn.  Recall, recent and remote, seems slightly impaired, but oriented to person, place, time and situation. . No evidence of aural or visual hallucinations or delusions.     Labs on Admission:  I have personally reviewed following labs and imaging studies: CBC:  Recent Labs Lab 11/07/16 0216  WBC 7.5  NEUTROABS 5.5  HGB 14.4  HCT 43.4  MCV 89.1  PLT 259   Basic Metabolic Panel:  Recent Labs Lab 11/07/16 0216  NA 139  K 3.8  CL 104  CO2 27  GLUCOSE 118*  BUN 16  CREATININE 1.23*  CALCIUM 9.3   GFR: Estimated Creatinine Clearance: 30.6 mL/min (A) (by C-G formula based on SCr of 1.23  mg/dL (H)).  Liver Function Tests:  Recent Labs Lab 11/07/16 0216  AST 31  ALT 17  ALKPHOS 35  BILITOT 1.1  PROT 6.3*  ALBUMIN 4.0      Radiological Exams on Admission: Personally reviewed CXR shows no emphysema, no airspace disease: Dg Chest 1 View  Result Date: 11/07/2016 CLINICAL DATA:  Preoperative evaluation. EXAM: CHEST 1 VIEW COMPARISON:  Prior radiograph from 09/05/2014. FINDINGS: The cardiac and mediastinal silhouettes are stable in size and contour, and remain within normal limits. Mild aortic atherosclerosis. The lungs are mildly hypoinflated with elevation of the right hemidiaphragm. Mild bibasilar subsegmental atelectasis. No airspace consolidation, pleural effusion, or pulmonary edema is identified. There is no pneumothorax. No acute osseous abnormality identified. IMPRESSION: Shallow lung inflation with mild bibasilar subsegmental atelectasis. No other active cardiopulmonary disease. Electronically Signed   By: Jeannine Boga M.D.   On: 11/07/2016 02:13   Dg Hip Unilat  With Pelvis 2-3 Views Right  Result Date: 11/07/2016 CLINICAL DATA:  Initial evaluation for acute right hip pain. EXAM: DG HIP (WITH OR WITHOUT PELVIS) 2-3V RIGHT COMPARISON:  None. FINDINGS: Subtle cortical irregularity through the subcapital region of the right femoral neck, suspicious for acute nondisplaced fracture. No significant displacement or impaction. Femoral head normally position within the acetabulum. Femoral head height preserved. Bony pelvis intact. Diffuse osteopenia. IMPRESSION: Acute nondisplaced subcapital type fracture through the right femoral neck. Electronically Signed   By: Jeannine Boga M.D.   On: 11/07/2016 02:15        Assessment and Plan: 1. Hip fracture: The patient will be seen by Dr. Percell Miller in the morning at [facility], to evaluate for operative fixation of the RIGHT hip.   -Admit to med-surg bed -Hydrocodone-acetaminophen or morphine as tolerated for pain -Bed  rest, apply ice, document sedation and vitals per Hip fracture protocol -NPO at midnight -MIVF -Nutrition consulted     Overall, the patient is at low risk for the planned surgery.  Patient has a RCRI score of 0 (active cardiac condition, CHF, CAD, DM treated with insulin, TIA/CVA, Cr > 2.0). She has a Gupta score 0.8%. The patient has no active cardiac symptoms, a functional capacity >4 METs, and would be  expected to be at average risk for cardiac complications from this intermediate risk procedure. -No further testing needed  Pulmonary: Patient does not have diagnosed COPD or OSA. Patient is at average risk for pulmonary complications.   Endocrine: Patient has no history of steroid use or DM.  Heme: Transfusion threshold 7 mg/dL.   Principal Problem:   Closed nondisplaced fracture of base of neck of right femur (HCC) Active Problems:   Essential hypertension   Hypothyroidism, acquired   Alzheimer's dementia  2. Hypertension: -Continue amlodipine -Continue statin, aspirin perioperatively  3. Hypothyroidism: -Continue levothyroxine  4. Mood: -Continue Seroquel and Remeron  5. Dementia: -Continue memantine and galantamine  6. Other medications: -Continue mirabegron  -Continue latanoprost drops   Post-operative medical care: Per AAOS 2014 guidelines on hip fractures in the elderly: -Recommend osteoporosis screening after discharge if not done previously -Recommend vitamin D 800 IUand calcium 1200 mg supplementation after discharge      DVT prophylaxis: SCDs  Diet: NPO Code Status: DO NOT RESUSCITATE  Family Communication: Daughter at bedside, overnight plan discussed, CODE STATUS confirmed.  Disposition Plan: Anticipate evaluation by Orthopedics and surgical fixation tomorrow, then PT evaluation and discharge to SNF in 2-3 days. Admission status: INPATIENT for hip fracture, medical surgical bed     Medical decision making and consults: Patient seen at 4:05  AM on 11/07/2016.  The patient was discussed with Junius Creamer, PA-C. What exists of the patient's chart was reviewed in depth and summarized above.  Clinical condition: stable.      Edwin Dada [facility] Pager [phone]

## 2016-11-07 NOTE — ED Provider Notes (Signed)
[facility] DEPT Provider Note   CSN: [id number] Arrival date & time: 11/07/16  0100     History   Chief Complaint Chief Complaint  Patient presents with  . Hip Pain    HPI Julia [name] is a 82 y.o. female.   Lady who presents with right hip pain today.  She said she sat down hard.  She didn't want to fall and since that time she's had some discomfort in her right groin and difficulty ambulating.  She states "my family is just making to begin deal out of it"      Past Medical History:  Diagnosis Date  . Alzheimer's dementia   . Anxiety   . Concussion with no loss of consciousness 05/14/2008   Qualifier: Diagnosis of  By: Arnoldo Morale MD, Balinda Quails   . Dementia   . GERD (gastroesophageal reflux disease)   . History of colonic polyps   . Hx of echocardiogram    Echo 4/16:  EF 55-60%, mild LVH  . Hyperlipidemia   . Hypertension   . Hypothyroidism   . Osteopenia   . Right forearm fracture 2011  . Thyroid disease    hypothyroidism    Patient Active Problem List   Diagnosis Date Noted  . Hip fracture ([place]) 11/07/2016  . Sinus bradycardia 09/05/2014  . Junctional escape rhythm 09/05/2014  . Alzheimer's dementia 04/21/2014  . Overactive bladder 04/21/2014  . Hypothyroid 11/21/2013  . PALPITATIONS, RECURRENT 12/09/2008  . Hyperlipidemia 04/18/2007  . Essential hypertension 12/11/2006  . GERD 12/11/2006  . Osteopenia 12/11/2006  . History of colonic polyps 12/11/2006    Past Surgical History:  Procedure Laterality Date  . ABDOMINAL HYSTERECTOMY  2009  . BILATERAL SALPINGOOPHORECTOMY    . bladder tack    . CATARACT EXTRACTION Right at least a year ago  . INCISION AND DRAINAGE OF WOUND Left 08/04/2015   Procedure: DEBRIDEMENT DISTAL INTERPHALANGEAL LEFT INDEX FINGER;  Surgeon: Daryll Brod, MD;  Location: [place];  Service: Orthopedics;  Laterality: Left;  Marland Kitchen MASS EXCISION N/A 08/04/2015   Procedure: EXCISION MUCOID CYST;  Surgeon: Daryll Brod, MD;   Location: [place];  Service: Orthopedics;  Laterality: N/A;  . MASS EXCISION Left 11/24/2015   Procedure: EXCISION RECURRENT  MUCOID CYST LEFT INDEX  FINGER;  Surgeon: Daryll Brod, MD;  Location: [place];  Service: Orthopedics;  Laterality: Left;  . ROTATOR CUFF REPAIR     right shoulder  . surgical repair to fx left wrist    . TONSILLECTOMY      OB History    No data available       Home Medications    Prior to Admission medications   Medication Sig Start Date End Date Taking? Authorizing Provider  amLODipine (NORVASC) 5 MG tablet take 1 tablet by mouth once daily 10/05/16  Yes Nafziger, Tommi Rumps, NP  aspirin 81 MG tablet Take 81 mg by mouth at bedtime.    Yes [provider]  galantamine (RAZADYNE ER) 24 MG 24 hr capsule TAKE 1 CAPSULE BY MOUTH DAILY WITH BREAKFAST 07/04/16  Yes Jaffe, Adam R, DO  latanoprost (XALATAN) 0.005 % ophthalmic solution Place 1 drop into both eyes at bedtime.   Yes [provider]  levothyroxine (SYNTHROID, LEVOTHROID) 88 MCG tablet TAKE 1 TABLET BY MOUTH DAILY BEFORE BREAKFAST 03/22/16  Yes Nafziger, Tommi Rumps, NP  mirabegron ER (MYRBETRIQ) 25 MG TB24 tablet Take 25 mg by mouth daily.   Yes [provider]  mirtazapine (REMERON) 15 MG tablet TAKE 1 TABLET BY MOUTH AT BEDTIME 07/18/16  Yes Pieter Partridge, DO  NAMENDA XR 28 MG CP24 24 hr capsule take 1 capsule by mouth daily 05/17/16  Yes Jaffe, Adam R, DO  QUEtiapine (SEROQUEL) 25 MG tablet take 1 tablet by mouth at bedtime 06/02/16  Yes Nafziger, Tommi Rumps, NP  rosuvastatin (CRESTOR) 20 MG tablet Take 1 tablet (20 mg total) by mouth once a week. 09/12/14  Yes Marin Olp, MD  ALPRAZolam Duanne Moron) 0.25 MG tablet Take 1 tablet (0.25 mg total) by mouth at bedtime as needed for anxiety. 02/25/14   Marin Olp, MD  Bismuth Subsalicylate (KAOPECTATE PO) Take by mouth. Reported on 10/17/2015    [provider]  hydrOXYzine (ATARAX/VISTARIL) 25 MG tablet Take  0.5-1 tablets (12.5-25 mg total) by mouth every 8 (eight) hours as needed for itching. Patient not taking: Reported on 10/28/2016 10/17/15   Robyn Haber, MD  Ibuprofen 200 MG CAPS Take 1 capsule (200 mg total) by mouth daily as needed. 09/06/14   Richardson Dopp T, PA-C  Multiple Vitamin (MULTIVITAMIN) tablet Take 1 tablet by mouth daily.    [provider]  Omega-3 Fatty Acids (FISH OIL) 1000 MG CAPS Take 1,000 mg by mouth daily.    [provider]  polyethylene glycol (MIRALAX / GLYCOLAX) packet Take 17 g by mouth daily as needed for mild constipation. Reported on 10/17/2015    [provider]  sertraline (ZOLOFT) 50 MG tablet Take 1 tablet (50 mg total) by mouth daily. Patient not taking: Reported on 10/28/2016 11/25/15   Pieter Partridge, DO  triamcinolone cream (KENALOG) 0.1 % Apply 1 application topically 2 (two) times daily. 10/17/15   Robyn Haber, MD    Family History Family History  Problem Relation Age of Onset  . Hypertension Mother   . COPD Mother   . Tuberculosis Mother        x 2  . Lung cancer Father   . Stomach cancer Father   . Heart disease Brother   . Drug abuse Maternal Uncle   . Cancer Maternal Uncle   . Alzheimer's disease Maternal Aunt   . Colon cancer Neg Hx   . Esophageal cancer Neg Hx   . Rectal cancer Neg Hx   . Heart attack Neg Hx   . Stroke Neg Hx     Social History Social History  Substance Use Topics  . Smoking status: Never Smoker  . Smokeless tobacco: Never Used  . Alcohol use No     Allergies   Entex and Sulfonamide derivatives   Review of Systems Review of Systems  Constitutional: Negative for fever.  Respiratory: Negative for shortness of breath.   Cardiovascular: Negative for chest pain.  Musculoskeletal: Positive for gait problem.  Skin: Negative for wound.  Neurological: Negative for dizziness.  All other systems reviewed and are negative.    Physical Exam Updated Vital Signs BP 133/61   Pulse 65    Temp 98.2 F (36.8 C) (Oral)   Resp 15   SpO2 96%   Physical Exam  Constitutional: She appears well-developed and well-nourished. No distress.  HENT:  Head: Normocephalic.  Eyes: Pupils are equal, round, and reactive to light.  Neck: Normal range of motion.  Cardiovascular: Normal rate.   Pulmonary/Chest: Effort normal.  Abdominal: Soft. Bowel sounds are normal. She exhibits no distension. There is no tenderness.  Musculoskeletal: She exhibits tenderness. She exhibits no deformity.  Right hip: She exhibits decreased range of motion and tenderness. She exhibits no bony tenderness, no swelling and no deformity.       Legs: Patient experiences pain with internal/external rotation and flexion  Neurological: She is alert.  Skin: Skin is warm.  Psychiatric: She has a normal mood and affect.  Nursing note and vitals reviewed.    ED Treatments / Results  Labs (all labs ordered are listed, but only abnormal results are displayed) Labs Reviewed  COMPREHENSIVE METABOLIC PANEL - Abnormal; Notable for the following:       Result Value   Glucose, Bld 118 (*)    Creatinine, Ser 1.23 (*)    Total Protein 6.3 (*)    GFR calc non Af Amer 41 (*)    GFR calc Af Amer 47 (*)    All other components within normal limits  CBC WITH DIFFERENTIAL/PLATELET    EKG  EKG Interpretation  Date/Time:  Monday November 07 2016 02:24:43 EDT Ventricular Rate:  68 PR Interval:    QRS Duration: 84 QT Interval:  288 QTC Calculation: 307 R Axis:   -42 Text Interpretation:  Sinus rhythm Left axis deviation Borderline repolarization abnormality Confirmed by Orpah Greek [phone]) on 11/07/2016 2:28:05 AM       Radiology Dg Chest 1 View  Result Date: 11/07/2016 CLINICAL DATA:  Preoperative evaluation. EXAM: CHEST 1 VIEW COMPARISON:  Prior radiograph from 09/05/2014. FINDINGS: The cardiac and mediastinal silhouettes are stable in size and contour, and remain within normal limits. Mild aortic  atherosclerosis. The lungs are mildly hypoinflated with elevation of the right hemidiaphragm. Mild bibasilar subsegmental atelectasis. No airspace consolidation, pleural effusion, or pulmonary edema is identified. There is no pneumothorax. No acute osseous abnormality identified. IMPRESSION: Shallow lung inflation with mild bibasilar subsegmental atelectasis. No other active cardiopulmonary disease. Electronically Signed   By: Jeannine Boga M.D.   On: 11/07/2016 02:13   Dg Hip Unilat  With Pelvis 2-3 Views Right  Result Date: 11/07/2016 CLINICAL DATA:  Initial evaluation for acute right hip pain. EXAM: DG HIP (WITH OR WITHOUT PELVIS) 2-3V RIGHT COMPARISON:  None. FINDINGS: Subtle cortical irregularity through the subcapital region of the right femoral neck, suspicious for acute nondisplaced fracture. No significant displacement or impaction. Femoral head normally position within the acetabulum. Femoral head height preserved. Bony pelvis intact. Diffuse osteopenia. IMPRESSION: Acute nondisplaced subcapital type fracture through the right femoral neck. Electronically Signed   By: Jeannine Boga M.D.   On: 11/07/2016 02:15    Procedures Procedures (including critical care time)  Medications Ordered in ED Medications - No data to display   Initial Impression / Assessment and Plan / ED Course  I have reviewed the triage vital signs and the nursing notes.  Pertinent labs & imaging results that were available during my care of the patient were reviewed by me and considered in my medical decision making (see chart for details).       I spoke with Dr. Bosie Clos who will preform surgery in the morning asks that she be admitted through [facility] and be kept NPO   Final Clinical Impressions(s) / ED Diagnoses   Final diagnoses:  Closed fracture of right hip, initial encounter [facility])    New Prescriptions New Prescriptions   No medications on file     Junius Creamer, NP 11/07/16  0422    Orpah Greek, MD 11/07/16 [phone]

## 2016-11-07 NOTE — Consult Note (Signed)
ORTHOPAEDIC CONSULTATION  REQUESTING PHYSICIAN: Rosita Fire, MD  Chief Complaint: right hip pain  HPI: Julia [name] is a 82 y.o. female who complains of mechanical pain after a partial slip yesterday  Past Medical History:  Diagnosis Date  . Alzheimer's dementia   . Anxiety   . Concussion with no loss of consciousness 05/14/2008   Qualifier: Diagnosis of  By: Arnoldo Morale MD, Balinda Quails   . Dementia   . GERD (gastroesophageal reflux disease)   . History of colonic polyps   . Hx of echocardiogram    Echo 4/16:  EF 55-60%, mild LVH  . Hyperlipidemia   . Hypertension   . Hypothyroidism   . Osteopenia   . Right forearm fracture 2011  . Thyroid disease    hypothyroidism   Past Surgical History:  Procedure Laterality Date  . ABDOMINAL HYSTERECTOMY  2009  . BILATERAL SALPINGOOPHORECTOMY    . bladder tack    . CATARACT EXTRACTION Right at least a year ago  . INCISION AND DRAINAGE OF WOUND Left 08/04/2015   Procedure: DEBRIDEMENT DISTAL INTERPHALANGEAL LEFT INDEX FINGER;  Surgeon: Daryll Brod, MD;  Location: [facility];  Service: Orthopedics;  Laterality: Left;  Marland Kitchen MASS EXCISION N/A 08/04/2015   Procedure: EXCISION MUCOID CYST;  Surgeon: Daryll Brod, MD;  Location: [facility];  Service: Orthopedics;  Laterality: N/A;  . MASS EXCISION Left 11/24/2015   Procedure: EXCISION RECURRENT  MUCOID CYST LEFT INDEX  FINGER;  Surgeon: Daryll Brod, MD;  Location: [facility];  Service: Orthopedics;  Laterality: Left;  . ROTATOR CUFF REPAIR     right shoulder  . surgical repair to fx left wrist    . TONSILLECTOMY     Social History   Social History  . Marital status: Married    Spouse name: N/A  . Number of children: N/A  . Years of education: N/A   Occupational History  . retired    Social History Main Topics  . Smoking status: Never Smoker  . Smokeless tobacco: Never Used  . Alcohol use No  . Drug use: No  . Sexual activity: No    Other Topics Concern  . None   Social History Narrative   Married to husband Glen for 47 years and lives in [place]. 2 daughters live in [place], 1 son in [place] and 1 in [place].       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       Hobbies: tv, caring for cat (very important to her), change is difficult even with type of tv      Jake Samples (daughter) Chauncey Reading [phone]   Full Code   Family History  Problem Relation Age of Onset  . Hypertension Mother   . COPD Mother   . Tuberculosis Mother        x 2  . Lung cancer Father   . Stomach cancer Father   . Heart disease Brother   . Drug abuse Maternal Uncle   . Cancer Maternal Uncle   . Alzheimer's disease Maternal Aunt   . Colon cancer Neg Hx   . Esophageal cancer Neg Hx   . Rectal cancer Neg Hx   . Heart attack Neg Hx   . Stroke Neg Hx    Allergies  Allergen Reactions  . Entex Other (See Comments)    unknown  . Sulfonamide Derivatives Other (See Comments)    unknown   Prior to  Admission medications   Medication Sig Start Date End Date Taking? Authorizing Provider  amLODipine (NORVASC) 5 MG tablet take 1 tablet by mouth once daily 10/05/16  Yes Nafziger, Tommi Rumps, NP  aspirin 81 MG tablet Take 81 mg by mouth at bedtime.    Yes [provider]  galantamine (RAZADYNE ER) 24 MG 24 hr capsule TAKE 1 CAPSULE BY MOUTH DAILY WITH BREAKFAST 07/04/16  Yes Jaffe, Adam R, DO  latanoprost (XALATAN) 0.005 % ophthalmic solution Place 1 drop into both eyes at bedtime.   Yes [provider]  levothyroxine (SYNTHROID, LEVOTHROID) 88 MCG tablet TAKE 1 TABLET BY MOUTH DAILY BEFORE BREAKFAST 03/22/16  Yes Nafziger, Tommi Rumps, NP  mirabegron ER (MYRBETRIQ) 25 MG TB24 tablet Take 25 mg by mouth daily.   Yes [provider]  mirtazapine (REMERON) 15 MG tablet TAKE 1 TABLET BY MOUTH AT BEDTIME 07/18/16  Yes Pieter Partridge, DO  NAMENDA XR 28 MG CP24 24 hr capsule take 1 capsule by mouth daily 05/17/16   Yes Jaffe, Adam R, DO  QUEtiapine (SEROQUEL) 25 MG tablet take 1 tablet by mouth at bedtime 06/02/16  Yes Nafziger, Tommi Rumps, NP  rosuvastatin (CRESTOR) 20 MG tablet Take 1 tablet (20 mg total) by mouth once a week. 09/12/14  Yes Marin Olp, MD  Bismuth Subsalicylate (KAOPECTATE PO) Take by mouth. Reported on 10/17/2015    [provider]  Ibuprofen 200 MG CAPS Take 1 capsule (200 mg total) by mouth daily as needed. 09/06/14   Richardson Dopp T, PA-C  Multiple Vitamin (MULTIVITAMIN) tablet Take 1 tablet by mouth daily.    [provider]  Omega-3 Fatty Acids (FISH OIL) 1000 MG CAPS Take 1,000 mg by mouth daily.    [provider]  polyethylene glycol (MIRALAX / GLYCOLAX) packet Take 17 g by mouth daily as needed for mild constipation. Reported on 10/17/2015    [provider]  triamcinolone cream (KENALOG) 0.1 % Apply 1 application topically 2 (two) times daily. 10/17/15   Robyn Haber, MD   Dg Chest 1 View  Result Date: 11/07/2016 CLINICAL DATA:  Preoperative evaluation. EXAM: CHEST 1 VIEW COMPARISON:  Prior radiograph from 09/05/2014. FINDINGS: The cardiac and mediastinal silhouettes are stable in size and contour, and remain within normal limits. Mild aortic atherosclerosis. The lungs are mildly hypoinflated with elevation of the right hemidiaphragm. Mild bibasilar subsegmental atelectasis. No airspace consolidation, pleural effusion, or pulmonary edema is identified. There is no pneumothorax. No acute osseous abnormality identified. IMPRESSION: Shallow lung inflation with mild bibasilar subsegmental atelectasis. No other active cardiopulmonary disease. Electronically Signed   By: Jeannine Boga M.D.   On: 11/07/2016 02:13   Dg Hip Unilat  With Pelvis 2-3 Views Right  Result Date: 11/07/2016 CLINICAL DATA:  Initial evaluation for acute right hip pain. EXAM: DG HIP (WITH OR WITHOUT PELVIS) 2-3V RIGHT COMPARISON:  None. FINDINGS: Subtle cortical irregularity  through the subcapital region of the right femoral neck, suspicious for acute nondisplaced fracture. No significant displacement or impaction. Femoral head normally position within the acetabulum. Femoral head height preserved. Bony pelvis intact. Diffuse osteopenia. IMPRESSION: Acute nondisplaced subcapital type fracture through the right femoral neck. Electronically Signed   By: Jeannine Boga M.D.   On: 11/07/2016 02:15    Positive ROS: All other systems have been reviewed and were otherwise negative with the exception of those mentioned in the HPI and as above.  Labs cbc  Recent Labs  11/07/16 0216 11/07/16 0618  WBC 7.5 6.1  HGB 14.4 14.7  HCT 43.4 45.6  PLT 199 197    Labs inflam No results for input(s): CRP in the last 72 hours.  Invalid input(s): ESR  Labs coag No results for input(s): INR, PTT in the last 72 hours.  Invalid input(s): PT   Recent Labs  11/07/16 0216 11/07/16 0618  NA 139 138  K 3.8 3.7  CL 104 103  CO2 27 28  GLUCOSE 118* 115*  BUN 16 14  CREATININE 1.23* 1.17*  CALCIUM 9.3 9.2    Physical Exam: Vitals:   11/07/16 0500 11/07/16 0609  BP: 140/78 (!) 166/58  Pulse: 67 63  Resp: 14 16  Temp:  97.6 F (36.4 C)   General: Alert, no acute distress Cardiovascular: No pedal edema Respiratory: No cyanosis, no use of accessory musculature GI: No organomegaly, abdomen is soft and non-tender Skin: No lesions in the area of chief complaint other than those listed below in MSK exam.  Neurologic: Sensation intact distally save for the below mentioned MSK exam Psychiatric: Patient is competent for consent with normal mood and affect Lymphatic: No axillary or cervical lymphadenopathy  MUSCULOSKELETAL:  RLE: compartments soft, NVI, skin benign Other extremities are atraumatic with painless ROM and NVI.  Assessment: R compressed Fem neck fracture  Plan: OR today for Hip pinning  Will be wbat post op.    Renette Butters, MD Cell  [phone]   11/07/2016 7:42 AM

## 2016-11-07 NOTE — ED Notes (Signed)
Paged [facility] ([facility])

## 2016-11-07 NOTE — ED Provider Notes (Signed)
Patient presented to the ER with right hip pain after "sitting down hard"  Face to face Exam: HEENT - PERRLA Lungs - CTAB Heart - RRR, no M/R/G Abd - S/NT/ND Neuro - alert, confused Musculoskeletal - R hip pain with ROM, no obvious shortening or deformity  Plan: Xray +subcapital fracture, admit for ortho treatment   Orpah Greek, MD 11/07/16 0221

## 2016-11-07 NOTE — OR Nursing (Signed)
Handoff report given to Ginger RN [id number] at bedside.  Julia [name] is pain free.  VSS and she can lift her non operative leg off the bed.

## 2016-11-07 NOTE — ED Notes (Signed)
Pt returned from xray

## 2016-11-07 NOTE — Transfer of Care (Signed)
Immediate Anesthesia Transfer of Care Note  Patient: Julia [name]  Procedure(s) Performed: Procedure(s): RIGHT CANNULATED HIP PINNING (Right)  Patient Location: PACU  Anesthesia Type:MAC and Spinal  Level of Consciousness: awake, alert  and oriented  Airway & Oxygen Therapy: Patient Spontanous Breathing and Patient connected to nasal cannula oxygen  Post-op Assessment: Report given to RN and Post -op Vital signs reviewed and stable  Post vital signs: Reviewed and stable  Last Vitals:  Vitals:   11/07/16 0500 11/07/16 0609  BP: 140/78 (!) 166/58  Pulse: 67 63  Resp: 14 16  Temp:  36.4 C    Last Pain:  Vitals:   11/07/16 0609  TempSrc: Oral  PainSc:          Complications: No apparent anesthesia complications

## 2016-11-07 NOTE — ED Triage Notes (Signed)
Pt presents via ems with c/o of right hip pain with ambulation. No known injury. Pt was seen 2 weeks ago for similar episode but nothing was found. EMS states when patient was on stretcher they noticed some right leg shortening.

## 2016-11-07 NOTE — Progress Notes (Signed)
PROGRESS NOTE    Julia [name]  WUJ:[id number] DOB: [birth year] DOA: 11/07/2016 PCP: Dorothyann Peng, NP   Brief Narrative: 82 y.o. female with a past medical history significant for hypothyroidism and HTN who presents with hip pain after a fall.Radiograph of the pelvis showed femoral neck fracture of the right. Orthopedics was consulted and patient is going to OR for surgical intervention. Patient reported she slipped and had fall. Denied any syncopal episode.  Assessment & Plan:   # Closed nondisplaced fracture of base of neck of right femur (HCC) - After the fall. Going to OR today. Orthopedic consult appreciated. Continue supportive care. PT OT and rehabilitation evaluation after the surgical intervention. Continue to provide supportive care.  #  Essential hypertension: Resume home medication when patient is able to take oral. Continue to monitor blood pressure    # Hypothyroidism: Continue Synthroid. Patient is an view for the procedure today.    # Alzheimer's dementia without behavioral disturbance: Continue home medication and supportive care.  # Hyperlipidemia: Continue Crestor  DVT prophylaxis: Going to OR today. Anticoagulation as per surgery team Code Status: DO NOT RESUSCITATE Family Communication: Discussed with the patient's onset bedside Disposition Plan: Likely discharge to rehabilitation in 1-2 days    Consultants:   Orthopedics  Procedures: None Antimicrobials: Cefazolin before the surgery as per Ortho  Subjective: Seen and examined at bedside. Denied nausea, vomiting, chest pain, shortness of breath. Denied leg pain.  Objective: Vitals:   11/07/16 0430 11/07/16 0445 11/07/16 0500 11/07/16 0609  BP: (!) 153/63  140/78 (!) 166/58  Pulse: 66 67 67 63  Resp: 13 15 14 16   Temp:    97.6 F (36.4 C)  TempSrc:    Oral  SpO2: 96% 96% 96% 94%  Weight:    52.2 kg (114 lb 15.9 oz)  Height:    5\' 3"  (1.6 m)    Intake/Output Summary (Last 24 hours) at  11/07/16 1415 Last data filed at 11/07/16 1000  Gross per 24 hour  Intake                0 ml  Output                0 ml  Net                0 ml   Filed Weights   11/07/16 0609  Weight: 52.2 kg (114 lb 15.9 oz)    Examination:  General exam: Appears calm and comfortable  Respiratory system: Clear to auscultation. Respiratory effort normal. No wheezing or crackle Cardiovascular system: S1 & S2 heard, RRR.  No pedal edema. Gastrointestinal system: Abdomen is nondistended, soft and nontender. Normal bowel sounds heard. Central nervous system: Alert Awake and following commands.. Skin: No rashes, lesions or ulcers   Data Reviewed: I have personally reviewed following labs and imaging studies  CBC:  Recent Labs Lab 11/07/16 0216 11/07/16 0618  WBC 7.5 6.1  NEUTROABS 5.5  --   HGB 14.4 14.7  HCT 43.4 45.6  MCV 89.1 90.1  PLT 199 956   Basic Metabolic Panel:  Recent Labs Lab 11/07/16 0216 11/07/16 0618  NA 139 138  K 3.8 3.7  CL 104 103  CO2 27 28  GLUCOSE 118* 115*  BUN 16 14  CREATININE 1.23* 1.17*  CALCIUM 9.3 9.2   GFR: Estimated Creatinine Clearance: 32.1 mL/min (A) (by C-G formula based on SCr of 1.17 mg/dL (H)). Liver Function Tests:  Recent Labs Lab 11/07/16 0216  AST 31  ALT 17  ALKPHOS 57  BILITOT 1.1  PROT 6.3*  ALBUMIN 4.0   No results for input(s): LIPASE, AMYLASE in the last 168 hours. No results for input(s): AMMONIA in the last 168 hours. Coagulation Profile: No results for input(s): INR, PROTIME in the last 168 hours. Cardiac Enzymes: No results for input(s): CKTOTAL, CKMB, CKMBINDEX, TROPONINI in the last 168 hours. BNP (last 3 results) No results for input(s): PROBNP in the last 8760 hours. HbA1C: No results for input(s): HGBA1C in the last 72 hours. CBG: No results for input(s): GLUCAP in the last 168 hours. Lipid Profile: No results for input(s): CHOL, HDL, LDLCALC, TRIG, CHOLHDL, LDLDIRECT in the last 72 hours. Thyroid  Function Tests: No results for input(s): TSH, T4TOTAL, FREET4, T3FREE, THYROIDAB in the last 72 hours. Anemia Panel: No results for input(s): VITAMINB12, FOLATE, FERRITIN, TIBC, IRON, RETICCTPCT in the last 72 hours. Sepsis Labs: No results for input(s): PROCALCITON, LATICACIDVEN in the last 168 hours.  No results found for this or any previous visit (from the past 240 hour(s)).       Radiology Studies: Dg Chest 1 View  Result Date: 11/07/2016 CLINICAL DATA:  Preoperative evaluation. EXAM: CHEST 1 VIEW COMPARISON:  Prior radiograph from 09/05/2014. FINDINGS: The cardiac and mediastinal silhouettes are stable in size and contour, and remain within normal limits. Mild aortic atherosclerosis. The lungs are mildly hypoinflated with elevation of the right hemidiaphragm. Mild bibasilar subsegmental atelectasis. No airspace consolidation, pleural effusion, or pulmonary edema is identified. There is no pneumothorax. No acute osseous abnormality identified. IMPRESSION: Shallow lung inflation with mild bibasilar subsegmental atelectasis. No other active cardiopulmonary disease. Electronically Signed   By: Jeannine Boga M.D.   On: 11/07/2016 02:13   Dg Hip Unilat  With Pelvis 2-3 Views Right  Result Date: 11/07/2016 CLINICAL DATA:  Initial evaluation for acute right hip pain. EXAM: DG HIP (WITH OR WITHOUT PELVIS) 2-3V RIGHT COMPARISON:  None. FINDINGS: Subtle cortical irregularity through the subcapital region of the right femoral neck, suspicious for acute nondisplaced fracture. No significant displacement or impaction. Femoral head normally position within the acetabulum. Femoral head height preserved. Bony pelvis intact. Diffuse osteopenia. IMPRESSION: Acute nondisplaced subcapital type fracture through the right femoral neck. Electronically Signed   By: Jeannine Boga M.D.   On: 11/07/2016 02:15        Scheduled Meds: . [MAR Hold] amLODipine  5 mg Oral Daily  . [MAR Hold] aspirin  EC  81 mg Oral QHS  . chlorhexidine  60 mL Topical Once  . [MAR Hold] docusate sodium  100 mg Oral BID  . [MAR Hold] galantamine  24 mg Oral Q breakfast  . [MAR Hold] latanoprost  1 drop Both Eyes QHS  . [MAR Hold] levothyroxine  88 mcg Oral QAC breakfast  . [MAR Hold] memantine  28 mg Oral Daily  . [MAR Hold] mirabegron ER  25 mg Oral Daily  . [MAR Hold] mirtazapine  15 mg Oral QHS  . [MAR Hold] QUEtiapine  25 mg Oral QHS  . [MAR Hold] rosuvastatin  20 mg Oral Q Mon   Continuous Infusions: . ceFAZolin    . sodium chloride 75 mL/hr at 11/07/16 0616  .  ceFAZolin (ANCEF) IV    . lactated ringers 10 mL/hr at 11/07/16 1409     LOS: 0 days    Awanda Wilcock Tanna Furry, MD [facility] Pager [phone]  If 7PM-7AM, please contact night-coverage [email or web address] Password [email or web address] 11/07/2016, 2:15 PM

## 2016-11-07 NOTE — Anesthesia Procedure Notes (Signed)
Procedure Name: MAC Date/Time: 11/07/2016 3:37 PM Performed by: Candis Shine Pre-anesthesia Checklist: Patient identified, Emergency Drugs available, Suction available, Patient being monitored and Timeout performed Patient Re-evaluated:Patient Re-evaluated prior to inductionOxygen Delivery Method: Nasal cannula Dental Injury: Teeth and Oropharynx as per pre-operative assessment

## 2016-11-07 NOTE — ED Notes (Signed)
Pt transported to xray 

## 2016-11-07 NOTE — H&P (View-Only) (Signed)
ORTHOPAEDIC CONSULTATION  REQUESTING PHYSICIAN: Rosita Fire, MD  Chief Complaint: right hip pain  HPI: Julia [name] is a 82 y.o. female who complains of mechanical pain after a partial slip yesterday  Past Medical History:  Diagnosis Date  . Alzheimer's dementia   . Anxiety   . Concussion with no loss of consciousness 05/14/2008   Qualifier: Diagnosis of  By: Arnoldo Morale MD, Balinda Quails   . Dementia   . GERD (gastroesophageal reflux disease)   . History of colonic polyps   . Hx of echocardiogram    Echo 4/16:  EF 55-60%, mild LVH  . Hyperlipidemia   . Hypertension   . Hypothyroidism   . Osteopenia   . Right forearm fracture 2011  . Thyroid disease    hypothyroidism   Past Surgical History:  Procedure Laterality Date  . ABDOMINAL HYSTERECTOMY  2009  . BILATERAL SALPINGOOPHORECTOMY    . bladder tack    . CATARACT EXTRACTION Right at least a year ago  . INCISION AND DRAINAGE OF WOUND Left 08/04/2015   Procedure: DEBRIDEMENT DISTAL INTERPHALANGEAL LEFT INDEX FINGER;  Surgeon: Daryll Brod, MD;  Location: [facility];  Service: Orthopedics;  Laterality: Left;  Marland Kitchen MASS EXCISION N/A 08/04/2015   Procedure: EXCISION MUCOID CYST;  Surgeon: Daryll Brod, MD;  Location: [facility];  Service: Orthopedics;  Laterality: N/A;  . MASS EXCISION Left 11/24/2015   Procedure: EXCISION RECURRENT  MUCOID CYST LEFT INDEX  FINGER;  Surgeon: Daryll Brod, MD;  Location: [facility];  Service: Orthopedics;  Laterality: Left;  . ROTATOR CUFF REPAIR     right shoulder  . surgical repair to fx left wrist    . TONSILLECTOMY     Social History   Social History  . Marital status: Married    Spouse name: N/A  . Number of children: N/A  . Years of education: N/A   Occupational History  . retired    Social History Main Topics  . Smoking status: Never Smoker  . Smokeless tobacco: Never Used  . Alcohol use No  . Drug use: No  . Sexual activity: No    Other Topics Concern  . None   Social History Narrative   Married to husband [place] for 68 years and lives in [place]. 2 daughters live in [place], 1 son in [place] and 1 in [place].       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       Hobbies: tv, caring for cat (very important to her), change is difficult even with type of tv      Jake Samples (daughter) Chauncey Reading [phone]   Full Code   Family History  Problem Relation Age of Onset  . Hypertension Mother   . COPD Mother   . Tuberculosis Mother        x 2  . Lung cancer Father   . Stomach cancer Father   . Heart disease Brother   . Drug abuse Maternal Uncle   . Cancer Maternal Uncle   . Alzheimer's disease Maternal Aunt   . Colon cancer Neg Hx   . Esophageal cancer Neg Hx   . Rectal cancer Neg Hx   . Heart attack Neg Hx   . Stroke Neg Hx    Allergies  Allergen Reactions  . Entex Other (See Comments)    unknown  . Sulfonamide Derivatives Other (See Comments)    unknown   Prior to  Admission medications   Medication Sig Start Date End Date Taking? Authorizing Provider  amLODipine (NORVASC) 5 MG tablet take 1 tablet by mouth once daily 10/05/16  Yes Nafziger, Tommi Rumps, NP  aspirin 81 MG tablet Take 81 mg by mouth at bedtime.    Yes [provider]  galantamine (RAZADYNE ER) 24 MG 24 hr capsule TAKE 1 CAPSULE BY MOUTH DAILY WITH BREAKFAST 07/04/16  Yes Jaffe, Adam R, DO  latanoprost (XALATAN) 0.005 % ophthalmic solution Place 1 drop into both eyes at bedtime.   Yes [provider]  levothyroxine (SYNTHROID, LEVOTHROID) 88 MCG tablet TAKE 1 TABLET BY MOUTH DAILY BEFORE BREAKFAST 03/22/16  Yes Nafziger, Tommi Rumps, NP  mirabegron ER (MYRBETRIQ) 25 MG TB24 tablet Take 25 mg by mouth daily.   Yes [provider]  mirtazapine (REMERON) 15 MG tablet TAKE 1 TABLET BY MOUTH AT BEDTIME 07/18/16  Yes Pieter Partridge, DO  NAMENDA XR 28 MG CP24 24 hr capsule take 1 capsule by mouth daily 05/17/16   Yes Jaffe, Adam R, DO  QUEtiapine (SEROQUEL) 25 MG tablet take 1 tablet by mouth at bedtime 06/02/16  Yes Nafziger, Tommi Rumps, NP  rosuvastatin (CRESTOR) 20 MG tablet Take 1 tablet (20 mg total) by mouth once a week. 09/12/14  Yes Marin Olp, MD  Bismuth Subsalicylate (KAOPECTATE PO) Take by mouth. Reported on 10/17/2015    [provider]  Ibuprofen 200 MG CAPS Take 1 capsule (200 mg total) by mouth daily as needed. 09/06/14   Richardson Dopp T, PA-C  Multiple Vitamin (MULTIVITAMIN) tablet Take 1 tablet by mouth daily.    [provider]  Omega-3 Fatty Acids (FISH OIL) 1000 MG CAPS Take 1,000 mg by mouth daily.    [provider]  polyethylene glycol (MIRALAX / GLYCOLAX) packet Take 17 g by mouth daily as needed for mild constipation. Reported on 10/17/2015    [provider]  triamcinolone cream (KENALOG) 0.1 % Apply 1 application topically 2 (two) times daily. 10/17/15   Robyn Haber, MD   Dg Chest 1 View  Result Date: 11/07/2016 CLINICAL DATA:  Preoperative evaluation. EXAM: CHEST 1 VIEW COMPARISON:  Prior radiograph from 09/05/2014. FINDINGS: The cardiac and mediastinal silhouettes are stable in size and contour, and remain within normal limits. Mild aortic atherosclerosis. The lungs are mildly hypoinflated with elevation of the right hemidiaphragm. Mild bibasilar subsegmental atelectasis. No airspace consolidation, pleural effusion, or pulmonary edema is identified. There is no pneumothorax. No acute osseous abnormality identified. IMPRESSION: Shallow lung inflation with mild bibasilar subsegmental atelectasis. No other active cardiopulmonary disease. Electronically Signed   By: Jeannine Boga M.D.   On: 11/07/2016 02:13   Dg Hip Unilat  With Pelvis 2-3 Views Right  Result Date: 11/07/2016 CLINICAL DATA:  Initial evaluation for acute right hip pain. EXAM: DG HIP (WITH OR WITHOUT PELVIS) 2-3V RIGHT COMPARISON:  None. FINDINGS: Subtle cortical irregularity  through the subcapital region of the right femoral neck, suspicious for acute nondisplaced fracture. No significant displacement or impaction. Femoral head normally position within the acetabulum. Femoral head height preserved. Bony pelvis intact. Diffuse osteopenia. IMPRESSION: Acute nondisplaced subcapital type fracture through the right femoral neck. Electronically Signed   By: Jeannine Boga M.D.   On: 11/07/2016 02:15    Positive ROS: All other systems have been reviewed and were otherwise negative with the exception of those mentioned in the HPI and as above.  Labs cbc  Recent Labs  11/07/16 0216 11/07/16 0618  WBC 7.5 6.1  HGB 14.4 14.7  HCT 43.4 45.6  PLT 199 197    Labs inflam No results for input(s): CRP in the last 72 hours.  Invalid input(s): ESR  Labs coag No results for input(s): INR, PTT in the last 72 hours.  Invalid input(s): PT   Recent Labs  11/07/16 0216 11/07/16 0618  NA 139 138  K 3.8 3.7  CL 104 103  CO2 27 28  GLUCOSE 118* 115*  BUN 16 14  CREATININE 1.23* 1.17*  CALCIUM 9.3 9.2    Physical Exam: Vitals:   11/07/16 0500 11/07/16 0609  BP: 140/78 (!) 166/58  Pulse: 67 63  Resp: 14 16  Temp:  97.6 F (36.4 C)   General: Alert, no acute distress Cardiovascular: No pedal edema Respiratory: No cyanosis, no use of accessory musculature GI: No organomegaly, abdomen is soft and non-tender Skin: No lesions in the area of chief complaint other than those listed below in MSK exam.  Neurologic: Sensation intact distally save for the below mentioned MSK exam Psychiatric: Patient is competent for consent with normal mood and affect Lymphatic: No axillary or cervical lymphadenopathy  MUSCULOSKELETAL:  RLE: compartments soft, NVI, skin benign Other extremities are atraumatic with painless ROM and NVI.  Assessment: R compressed Fem neck fracture  Plan: OR today for Hip pinning  Will be wbat post op.    Renette Butters, MD Cell  [phone]   11/07/2016 7:42 AM

## 2016-11-07 NOTE — Progress Notes (Addendum)
Blood bank called and stated that patient has an anitobdy during type and screen run. Spoke to [employer], PA to inform him since blood is being prepared for patient for OR today.

## 2016-11-07 NOTE — Progress Notes (Signed)
Initial Nutrition Assessment  DOCUMENTATION CODES:   Not applicable  INTERVENTION:   Once diet is advanced recommend Ensure Enlive po BID, each supplement provides 350 kcal and 20 grams of protein  Encouraged adequate intake of kcal and protein for healing. Reviewed protein sources. Reviewed menu and menu ordering with pt and family.   NUTRITION DIAGNOSIS:   Increased nutrient needs related to wound healing (fracture) as evidenced by estimated needs.  GOAL:   Patient will meet greater than or equal to 90% of their needs  MONITOR:   PO intake, Diet advancement, Supplement acceptance, I & O's  REASON FOR ASSESSMENT:   Consult Hip fracture protocol  ASSESSMENT:   Pt with hx of HTN and hypothyroidism admitted with hip fx after fall.    Surgery planned today  Spoke with pt and her daughter and son. They report that pt was dx with Alzheimer's 4-5 years ago and since that time has slowly lost weight. Weight at time of dx was 129 lb (12% weight loss x 4-5 years) She forgets to drink and has come to the hospital a few times for dehydration. She lives with her husband, they do not cook but do heat up frozen dinners and go out to eat. She only eats 2 meals per day (Breakfast: cereal, Dinner: frozen dinner, meal out). She does not routinely dink supplements but when they do get them for her her husband drinks them as he is noncompliant with his diabetic diet.   Medications reviewed and include: colace, remeron  Nutrition-Focused physical exam completed, limited. Findings are mild/moderate fat depletion in orbital and buccal areas, mild/moderate muscle depletion of temples and hands, and no edema.   Pt at risk for malnutrition but does not yet meet the criteria.   Diet Order:  Diet NPO time specified Diet NPO time specified Except for: Sips with Meds  Skin:  Reviewed, no issues  Last BM:  unknown  Height:   Ht Readings from Last 1 Encounters:  11/07/16 5\' 3"  (1.6 m)     Weight:   Wt Readings from Last 1 Encounters:  11/07/16 114 lb 15.9 oz (52.2 kg)    Ideal Body Weight:  52.2 kg  BMI:  Body mass index is 20.37 kg/m.  Estimated Nutritional Needs:   Kcal:  1400-1600  Protein:  65-75 grams  Fluid:  > 1.5 L/day  EDUCATION NEEDS:   Education needs addressed  Maylon Peppers RD, [place], [place] Pager [phone] After Hours Pager

## 2016-11-08 ENCOUNTER — Encounter (HOSPITAL_COMMUNITY): Payer: Self-pay | Admitting: Orthopedic Surgery

## 2016-11-08 DIAGNOSIS — G309 Alzheimer's disease, unspecified: Secondary | ICD-10-CM

## 2016-11-08 DIAGNOSIS — S72001A Fracture of unspecified part of neck of right femur, initial encounter for closed fracture: Secondary | ICD-10-CM

## 2016-11-08 DIAGNOSIS — F0280 Dementia in other diseases classified elsewhere without behavioral disturbance: Secondary | ICD-10-CM

## 2016-11-08 DIAGNOSIS — E039 Hypothyroidism, unspecified: Secondary | ICD-10-CM

## 2016-11-08 DIAGNOSIS — I10 Essential (primary) hypertension: Secondary | ICD-10-CM

## 2016-11-08 LAB — BASIC METABOLIC PANEL
Anion gap: 6 (ref 5–15)
BUN: 11 mg/dL (ref 6–20)
CALCIUM: 8.6 mg/dL — AB (ref 8.9–10.3)
CO2: 28 mmol/L (ref 22–32)
CREATININE: 1.22 mg/dL — AB (ref 0.44–1.00)
Chloride: 106 mmol/L (ref 101–111)
GFR, EST AFRICAN AMERICAN: 48 mL/min — AB (ref >60)
GFR, EST NON AFRICAN AMERICAN: 41 mL/min — AB (ref >60)
GLUCOSE: 100 mg/dL — AB (ref 65–99)
Potassium: 4.2 mmol/L (ref 3.5–5.1)
Sodium: 140 mmol/L (ref 135–145)

## 2016-11-08 LAB — CBC
HEMATOCRIT: 40.1 % (ref 36.0–46.0)
Hemoglobin: 12.7 g/dL (ref 12.0–15.0)
MCH: 29.1 pg (ref 26.0–34.0)
MCHC: 31.7 g/dL (ref 30.0–36.0)
MCV: 91.8 fL (ref 78.0–100.0)
PLATELETS: 183 10*3/uL (ref 150–400)
RBC: 4.37 MIL/uL (ref 3.87–5.11)
RDW: 13.7 % (ref 11.5–15.5)
WBC: 5.1 10*3/uL (ref 4.0–10.5)

## 2016-11-08 LAB — VITAMIN D 25 HYDROXY (VIT D DEFICIENCY, FRACTURES): VIT D 25 HYDROXY: 28.8 ng/mL — AB (ref 30.0–100.0)

## 2016-11-08 NOTE — Clinical Social Work Note (Signed)
Clinical Social Work Assessment  Patient Details  Name: [name] MRN: [id number] Date of Birth: 02/06/1935  Date of referral:  11/08/16               Reason for consult:  Facility Placement                Permission sought to share information with:  Facility Art therapist granted to share information::  Yes, Verbal Permission Granted  Name::     [place]::  SNF  Relationship::  daughters  Contact Information:     Housing/Transportation Living arrangements for the past 2 months:  Single Family Home Source of Information:  Patient, Adult Children Patient Interpreter Needed:  None Criminal Activity/Legal Involvement Pertinent to Current Situation/Hospitalization:  No - Comment as needed Significant Relationships:  Adult Children, Spouse, Other Family Members Lives with:  Spouse Do you feel safe going back to the place where you live?  No Need for family participation in patient care:  Yes (Comment)  Care giving concerns:  Patient resides at home with spouse. She was independent with ADL's prior to fall. Patient is not safe to return home at this time and will need rehabilitation.  Social Worker assessment / plan:  CSW met with patient/daughter Tammy at bedside to discuss the recommendations from PT for further skilled nursing needs.  CSW explained her role and discussed SNF options/placement.  Patient/family has no experience with SNF however did indicate that there choices are 1. [facility] 2. [name]. CSW received permission to send out offers to local area as well.  FL2 complete. Passr obtained. Offers sent.   Employment status:  Retired Nurse, adult PT Recommendations:  [place] / Referral to community resources:  [place]  Patient/Family's Response to care:  Patient/Family are appreciative of assistance with SNF options. No issues or concerns identified at  this time.  Patient/Family's Understanding of and Emotional Response to Diagnosis, Current Treatment, and Prognosis:  Patient/family has good understanding of the diagnosis, current treatment and prognosis. They are hopeful that rehabilitation will address the physical impairment. No issues or concerns at this time.  Emotional Assessment Appearance:  Appears stated age Attitude/Demeanor/Rapport:   (Cooperative) Affect (typically observed):  Accepting, Appropriate Orientation:  Oriented to Self, Oriented to Place, Oriented to  Time, Oriented to Situation Alcohol / Substance use:  Not Applicable Psych involvement (Current and /or in the community):  No (Comment)  Discharge Needs  Concerns to be addressed:  Care Coordination Readmission within the last 30 days:  No Current discharge risk:  Dependent with Mobility, Physical Impairment Barriers to Discharge:  No Barriers Identified   Normajean Baxter, LCSW 11/08/2016, 12:34 PM

## 2016-11-08 NOTE — Care Management Note (Signed)
Case Management Note  Patient Details  Name: YONA KOSEK MRN: [id number] Date of Birth: 04-25-1935  Subjective/Objective:      Closed nondisplaced fx of right femur, right cannulated   Hip pinning        Action/Plan: Discharge Planning: Chart reviewed. Scheduled dc to SNF. CSW following for SNF placement.   PCP Dorothyann Peng MD  Expected Discharge Date:  11/08/16               Expected Discharge Plan:  Skilled Nursing Facility  In-House Referral:  Clinical Social Work  Discharge planning Services  CM Consult  Post Acute Care Choice:  NA Choice offered to:  NA  DME Arranged:  N/A DME Agency:  NA  HH Arranged:  NA HH Agency:  NA  Status of Service:  Completed, signed off  If discussed at [employer] of Stay Meetings, dates discussed:    Additional Comments:  Erenest Rasher, RN 11/08/2016, 4:41 PM

## 2016-11-08 NOTE — Clinical Social Work Placement (Signed)
   CLINICAL SOCIAL WORK PLACEMENT  NOTE  Date:  11/08/2016  Patient Details  Name: [name] MRN: [id number] Date of Birth: January 10, 1935  Clinical Social Work is seeking post-discharge placement for this patient at the [place] level of care (*CSW will initial, date and re-position this form in  chart as items are completed):  Yes   Patient/family provided with [facility] Work Department's list of facilities offering this level of care within the geographic area requested by the patient (or if unable, by the patient's family).  Yes   Patient/family informed of their freedom to choose among providers that offer the needed level of care, that participate in Medicare, Medicaid or managed care program needed by the patient, have an available bed and are willing to accept the patient.  Yes   Patient/family informed of [facility]'s ownership interest in [facility] and [facility], as well as of the fact that they are under no obligation to receive care at these facilities.  PASRR submitted to EDS on       PASRR number received on 11/08/16     Existing PASRR number confirmed on 11/08/16     FL2 transmitted to all facilities in geographic area requested by pt/family on 11/08/16     FL2 transmitted to all facilities within larger geographic area on 11/08/16     Patient informed that his/her managed care company has contracts with or will negotiate with certain facilities, including the following:        Yes   Patient/family informed of bed offers received.  Patient chooses bed at [facility]     Physician recommends and patient chooses bed at      Patient to be transferred to [facility] on 11/08/16.  Patient to be transferred to facility by [facility]     Patient family notified on 11/08/16 of transfer.  Name of family member notified:  daughter, tammy and janet     PHYSICIAN Please prepare priority discharge summary, including  medications, Please prepare prescriptions, Please sign FL2, Please sign DNR     Additional Comment:    _______________________________________________ Normajean Baxter, LCSW 11/08/2016, 3:33 PM

## 2016-11-08 NOTE — Progress Notes (Signed)
Patient to be discharged to [facility]. IV removed. Nurse called and gave report to desere Whitted. Patient is waiting for [facility] for transportation

## 2016-11-08 NOTE — Social Work (Signed)
Clinical Social Worker facilitated patient discharge including contacting patient family and facility to confirm patient discharge plans.  Clinical information faxed to facility and family agreeable with plan.  CSW arranged ambulance transport via [facility] to [facility].  RN to call [phone] report prior to discharge.  Clinical Social Worker will sign off for now as social work intervention is no longer needed. Please consult us again if new need arises.  Ethel Meisenheimer, LCSW Clinical Social Worker [phone]    

## 2016-11-08 NOTE — Evaluation (Signed)
Occupational Therapy Evaluation Patient Details Name: [name] MRN: [id number] DOB: May 23, 1935 Today's Date: 11/08/2016    History of Present Illness Pt is a 82 yo female, who presented to ED with R hip pain s/p mechanical fall. dx with R femur fx s/p R cannulated hip pinning 11/09/16. PMH significant for essential HTN, Hypothyroid, and Alzheimer's disease.     Clinical Impression   PTA, pt was living with her husband and was independent. Currently, pt requires Min A for ADLs and functional mobility using RW and requires Min-Mod VCs throughout session. Provided education on LB ADLs, toilet transfer, and precautions. Recommend dc to SNF for further OT to increase pt safety and independence with ADLs and fucntional mobility. All further OT needs can be met at to next venue of care.     Follow Up Recommendations  SNF;Supervision/Assistance - 24 hour    Equipment Recommendations  Other (comment) (Defer to next venue)    Recommendations for Other Services PT consult     Precautions / Restrictions Precautions Precautions: Fall Restrictions Weight Bearing Restrictions: Yes RLE Weight Bearing: Weight bearing as tolerated      Mobility Bed Mobility               General bed mobility comments: Pt in recliner upon arrival  Transfers Overall transfer level: Needs assistance Equipment used: Rolling walker (2 wheeled) Transfers: Sit to/from Stand Sit to Stand: Min assist         General transfer comment: minA for power up and steadying with RW, vc for hand placement for powerup and controlled descent,     Balance Overall balance assessment: Needs assistance Sitting-balance support: Feet supported;No upper extremity supported Sitting balance-Leahy Scale: Fair     Standing balance support: Bilateral upper extremity supported Standing balance-Leahy Scale: Poor Standing balance comment: requires RW for balance                           ADL either  performed or assessed with clinical judgement   ADL Overall ADL's : Needs assistance/impaired Eating/Feeding: Set up;Sitting   Grooming: Minimal assistance;Wash/dry hands;Standing Grooming Details (indicate cue type and reason): Min A standing balance Upper Body Bathing: Set up;Sitting   Lower Body Bathing: Minimal assistance;Sit to/from stand   Upper Body Dressing : Set up;Sitting   Lower Body Dressing: Minimal assistance;Sit to/from stand   Toilet Transfer: Minimal assistance;Ambulation;BSC;RW   Toileting- Clothing Manipulation and Hygiene: Set up;Supervision/safety;Sitting/lateral lean       Functional mobility during ADLs: Minimal assistance;Rolling walker General ADL Comments: Pt performed ADLs and functional mobility with Min A and Mod VCS to transition between tasks and sequence     Vision         Perception     Praxis      Pertinent Vitals/Pain Pain Assessment: Faces Faces Pain Scale: Hurts a little bit Pain Location: R hip  Pain Descriptors / Indicators: Grimacing;Guarding Pain Intervention(s): Monitored during session     Hand Dominance     Extremity/Trunk Assessment Upper Extremity Assessment Upper Extremity Assessment: Overall WFL for tasks assessed   Lower Extremity Assessment Lower Extremity Assessment: Defer to PT evaluation RLE Deficits / Details: R hip fx pinning, R hip and knee ROM limited by pain  RLE: Unable to fully assess due to pain   Cervical / Trunk Assessment Cervical / Trunk Assessment: Normal   Communication Communication Communication: No difficulties   Cognition Arousal/Alertness: Awake/alert Behavior During Therapy: WFL for tasks assessed/performed  Overall Cognitive Status: Within Functional Limits for tasks assessed                                     General Comments  Pt demonstrates decreased memory and would repoeat questions and had difficulty transitioning between tasks    Exercises     Shoulder  Instructions      Home Living Family/patient expects to be discharged to:: Private residence Living Arrangements: Spouse/significant other Available Help at Discharge: Family;Friend(s);Available 24 hours/day Type of Home: House Home Access: Ramped entrance     Home Layout: One level     Bathroom Shower/Tub: Occupational psychologist: Standard Bathroom Accessibility: Yes   Home Equipment: Cane - single point;Shower seat;Grab bars - tub/shower          Prior Functioning/Environment Level of Independence: Independent        Comments: ADLs, IADLs, driving        OT Problem List: Decreased strength;Decreased range of motion;Decreased activity tolerance;Impaired balance (sitting and/or standing);Pain;Decreased knowledge of use of DME or AE;Decreased cognition;Decreased safety awareness;Decreased knowledge of precautions      OT Treatment/Interventions:      OT Goals(Current goals can be found in the care plan section) Acute Rehab OT Goals Patient Stated Goal: wants to go home to be with her husband OT Goal Formulation: With patient Time For Goal Achievement: 11/22/16 Potential to Achieve Goals: Good  OT Frequency:     Barriers to D/C:            Co-evaluation              AM-PAC PT "6 Clicks" Daily Activity     Outcome Measure Help from another person eating meals?: None Help from another person taking care of personal grooming?: A Little Help from another person toileting, which includes using toliet, bedpan, or urinal?: A Little Help from another person bathing (including washing, rinsing, drying)?: A Little Help from another person to put on and taking off regular upper body clothing?: A Little Help from another person to put on and taking off regular lower body clothing?: A Little 6 Click Score: 19   End of Session Equipment Utilized During Treatment: Gait belt;Rolling walker Nurse Communication: Mobility status;Weight bearing  status  Activity Tolerance: Patient tolerated treatment well Patient left: in chair;with call bell/phone within reach;with family/visitor present  OT Visit Diagnosis: Unsteadiness on feet (R26.81);Other abnormalities of gait and mobility (R26.89);Pain;Muscle weakness (generalized) (M62.81);History of falling (Z91.81) Pain - Right/Left: Right Pain - part of body: Leg                Time: 6754-4920 OT Time Calculation (min): 26 min Charges:  OT General Charges $OT Visit: 1 Procedure OT Evaluation $OT Eval Low Complexity: 1 Procedure OT Treatments $Self Care/Home Management : 8-22 mins G-Codes:     [place], OTR/L Acute Rehab Pager: [phone] Office: [place] 11/08/2016, 5:02 PM

## 2016-11-08 NOTE — Discharge Summary (Signed)
Physician Discharge Summary  Julia [name]:[id number] DOB: 29-Nov-1934 DOA: 11/07/2016  PCP: Dorothyann Peng, NP  Admit date: 11/07/2016 Discharge date: 11/08/2016  Admitted From:home Disposition:SNF  Recommendations for Outpatient Follow-up:  1. Follow up with PCP and orthopedics in 1-2 weeks 2. Please obtain BMP/CBC in one week   Home Health:SNF Equipment/Devices:none Discharge Condition:stable CODE STATUS:DNR Diet recommendation:heart healthy  Brief/Interim Summary: 82 y.o.femalewith a past medical history significant for hypothyroidism and HTNwho presents with hip pain after a fall.Radiograph of the pelvis showed femoral neck fracture of the right. Orthopedics was consulted and patient Underwent surgical intervention. Patient reported she slipped and had fall. Denied any syncopal episode.  # Closed nondisplaced fracture of base of neck of right femur (HCC) - After the fall. Evaluated by orthopedics and patient underwent RIGHT CANNULATED HIP PINNING. Patient reported feeling good. PT OT recommended a skilled nursing facility. Discussed with the patient and her daughter at bedside. They agreed for short-term rehabilitation. Discussed with the Education officer, museum. Patient will be discharged to a skilled facility today in stable condition with outpatient follow-up with PCP and orthopedics. -Orthopedics discharging with high dose of aspirin for DVT prophylaxis. After completion of high-dose aspirin plan to resume prior dose of baby aspirin. Recommended to follow-up with orthopedics.  #  Essential hypertension: Blood pressure elevated. Resume home medications and manage pain. Recommended to monitor blood pressure closely.    # Hypothyroidism: Continue Synthroid.     # Alzheimer's dementia without behavioral disturbance: Continue home medication and supportive care.  # Hyperlipidemia: Continue Crestor  Discharge Diagnoses:  Principal Problem:   Closed nondisplaced fracture of base  of neck of right femur (HCC) Active Problems:   Essential hypertension   Hypothyroidism, acquired   Alzheimer's dementia    Discharge Instructions  Discharge Instructions    Call MD for:  difficulty breathing, headache or visual disturbances    Complete by:  As directed    Call MD for:  extreme fatigue    Complete by:  As directed    Call MD for:  hives    Complete by:  As directed    Call MD for:  persistant dizziness or light-headedness    Complete by:  As directed    Call MD for:  persistant nausea and vomiting    Complete by:  As directed    Call MD for:  redness, tenderness, or signs of infection (pain, swelling, redness, odor or green/yellow discharge around incision site)    Complete by:  As directed    Call MD for:  severe uncontrolled pain    Complete by:  As directed    Call MD for:  temperature >100.4    Complete by:  As directed    Diet - low sodium heart healthy    Complete by:  As directed    Increase activity slowly    Complete by:  As directed      Allergies as of 11/08/2016      Reactions   Entex Other (See Comments)   unknown   Sulfonamide Derivatives Other (See Comments)   unknown      Medication List    STOP taking these medications   aspirin 81 MG tablet Replaced by:  aspirin EC 325 MG tablet     TAKE these medications   amLODipine 5 MG tablet Commonly known as:  NORVASC take 1 tablet by mouth once daily   aspirin EC 325 MG tablet Take 1 tablet (325 mg total) by mouth daily. For  30 days post op for DVT Prophylaxis.  Resume 81 mg Aspirin when complete. Replaces:  aspirin 81 MG tablet   docusate sodium 100 MG capsule Commonly known as:  COLACE Take 1 capsule (100 mg total) by mouth 2 (two) times daily. To prevent constipation while taking pain medication.   Fish Oil 1000 MG Caps Take 1,000 mg by mouth daily.   galantamine 24 MG 24 hr capsule Commonly known as:  RAZADYNE ER TAKE 1 CAPSULE BY MOUTH DAILY WITH BREAKFAST    HYDROcodone-acetaminophen 5-325 MG tablet Commonly known as:  NORCO Take 1-2 tablets by mouth every 6 (six) hours as needed for moderate pain.   Ibuprofen 200 MG Caps Take 1 capsule (200 mg total) by mouth daily as needed.   KAOPECTATE PO Take by mouth. Reported on 10/17/2015   latanoprost 0.005 % ophthalmic solution Commonly known as:  XALATAN Place 1 drop into both eyes at bedtime.   levothyroxine 88 MCG tablet Commonly known as:  SYNTHROID, LEVOTHROID TAKE 1 TABLET BY MOUTH DAILY BEFORE BREAKFAST   mirtazapine 15 MG tablet Commonly known as:  REMERON TAKE 1 TABLET BY MOUTH AT BEDTIME   multivitamin tablet Take 1 tablet by mouth daily.   MYRBETRIQ 25 MG Tb24 tablet Generic drug:  mirabegron ER Take 25 mg by mouth daily.   NAMENDA XR 28 MG Cp24 24 hr capsule Generic drug:  memantine take 1 capsule by mouth daily   omeprazole 20 MG capsule Commonly known as:  PRILOSEC Take 1 capsule (20 mg total) by mouth daily. While taking anti inflammatory medicine daily   polyethylene glycol packet Commonly known as:  MIRALAX / GLYCOLAX Take 17 g by mouth daily as needed for mild constipation. Reported on 10/17/2015   QUEtiapine 25 MG tablet Commonly known as:  SEROQUEL take 1 tablet by mouth at bedtime   rosuvastatin 20 MG tablet Commonly known as:  CRESTOR Take 1 tablet (20 mg total) by mouth once a week.   triamcinolone cream 0.1 % Commonly known as:  KENALOG Apply 1 application topically 2 (two) times daily.      Follow-up Information    Renette Butters, MD. Schedule an appointment as soon as possible for a visit in 2 week(s).   Specialty:  Orthopedic Surgery Contact information: [address] [place] [address] [phone]        Dorothyann Peng, NP. Schedule an appointment as soon as possible for a visit in 1 week(s).   Specialty:  Family Medicine Contact information: [place] [address] [phone]           Allergies  Allergen Reactions  . Entex Other (See Comments)    unknown  . Sulfonamide Derivatives Other (See Comments)    unknown    Consultations: Orthopedics  Procedures/Studies: RIGHT CANNULATED HIP PINNING  Subjective: Seen and examined at bedside. Reported doing well. Denies nausea vomiting chest pain, shortness of breath or hip pain. Eager to go from hospital today. Daughter at bedside.  Discharge Exam: Vitals:   11/08/16 0613 11/08/16 1415  BP: (!) 148/55 (!) 162/70  Pulse: 79 87  Resp: 16 15  Temp: 98.2 F (36.8 C) 98.1 F (36.7 C)   Vitals:   11/07/16 2043 11/08/16 0055 11/08/16 0613 11/08/16 1415  BP: 130/65 (!) 153/66 (!) 148/55 (!) 162/70  Pulse: 83 74 79 87  Resp: 18 18 16 15   Temp: 98.3 F (36.8 C) 98.1 F (36.7 C) 98.2 F (36.8 C) 98.1 F (  36.7 C)  TempSrc: Oral Oral Oral Oral  SpO2: 98% 98% 92% 94%  Weight:      Height:        General: Pt is alert, awake, not in acute distress Cardiovascular: RRR, S1/S2 +, no rubs, no gallops Respiratory: CTA bilaterally, no wheezing, no rhonchi Abdominal: Soft, NT, ND, bowel sounds + Extremities: no edema, no cyanosis    The results of significant diagnostics from this hospitalization (including imaging, microbiology, ancillary and laboratory) are listed below for reference.     Microbiology: Recent Results (from the past 240 hour(s))  Surgical PCR screen     Status: None   Collection Time: 11/07/16  1:44 PM  Result Value Ref Range Status   MRSA, PCR NEGATIVE NEGATIVE Final   Staphylococcus aureus NEGATIVE NEGATIVE Final    Comment:        The Xpert SA Assay (FDA approved for NASAL specimens in patients over 51 years of age), is one component of a comprehensive surveillance program.  Test performance has been validated by [facility] for patients greater than or equal to 36 year old. It is not intended to diagnose infection nor to guide or monitor treatment.      Labs: BNP (last 3  results) No results for input(s): BNP in the last 8760 hours. Basic Metabolic Panel:  Recent Labs Lab 11/07/16 0216 11/07/16 0618 11/08/16 0500  NA 139 138 140  K 3.8 3.7 4.2  CL 104 103 106  CO2 27 28 28   GLUCOSE 118* 115* 100*  BUN 16 14 11   CREATININE 1.23* 1.17* 1.22*  CALCIUM 9.3 9.2 8.6*   Liver Function Tests:  Recent Labs Lab 11/07/16 0216  AST 31  ALT 17  ALKPHOS 57  BILITOT 1.1  PROT 6.3*  ALBUMIN 4.0   No results for input(s): LIPASE, AMYLASE in the last 168 hours. No results for input(s): AMMONIA in the last 168 hours. CBC:  Recent Labs Lab 11/07/16 0216 11/07/16 0618 11/08/16 0500  WBC 7.5 6.1 5.1  NEUTROABS 5.5  --   --   HGB 14.4 14.7 12.7  HCT 43.4 45.6 40.1  MCV 89.1 90.1 91.8  PLT 199 197 183   Cardiac Enzymes: No results for input(s): CKTOTAL, CKMB, CKMBINDEX, TROPONINI in the last 168 hours. BNP: Invalid input(s): POCBNP CBG: No results for input(s): GLUCAP in the last 168 hours. D-Dimer No results for input(s): DDIMER in the last 72 hours. Hgb A1c No results for input(s): HGBA1C in the last 72 hours. Lipid Profile No results for input(s): CHOL, HDL, LDLCALC, TRIG, CHOLHDL, LDLDIRECT in the last 72 hours. Thyroid function studies No results for input(s): TSH, T4TOTAL, T3FREE, THYROIDAB in the last 72 hours.  Invalid input(s): FREET3 Anemia work up No results for input(s): VITAMINB12, FOLATE, FERRITIN, TIBC, IRON, RETICCTPCT in the last 72 hours. Urinalysis    Component Value Date/Time   COLORURINE YELLOW 10/28/2016 1620   APPEARANCEUR HAZY (A) 10/28/2016 1620   LABSPEC 1.016 10/28/2016 1620   PHURINE 6.0 10/28/2016 1620   GLUCOSEU NEGATIVE 10/28/2016 1620   HGBUR NEGATIVE 10/28/2016 1620   HGBUR negative 03/02/2010 1020   BILIRUBINUR NEGATIVE 10/28/2016 1620   BILIRUBINUR n 07/09/2015 1041   KETONESUR NEGATIVE 10/28/2016 1620   PROTEINUR NEGATIVE 10/28/2016 1620   UROBILINOGEN 1.0 07/09/2015 1041   UROBILINOGEN 1.0  09/05/2014 0400   NITRITE NEGATIVE 10/28/2016 1620   LEUKOCYTESUR TRACE (A) 10/28/2016 1620   Sepsis Labs Invalid input(s): PROCALCITONIN,  WBC,  LACTICIDVEN Microbiology Recent Results (from  the past 240 hour(s))  Surgical PCR screen     Status: None   Collection Time: 11/07/16  1:44 PM  Result Value Ref Range Status   MRSA, PCR NEGATIVE NEGATIVE Final   Staphylococcus aureus NEGATIVE NEGATIVE Final    Comment:        The Xpert SA Assay (FDA approved for NASAL specimens in patients over 67 years of age), is one component of a comprehensive surveillance program.  Test performance has been validated by [facility] for patients greater than or equal to 39 year old. It is not intended to diagnose infection nor to guide or monitor treatment.      Time coordinating discharge: 85  SIGNED:   Sholanda Croson Tanna Furry, MD  [facility] 11/08/2016, 3:22 PM  If 7PM-7AM, please contact night-coverage [email or web address] Password [email or web address]

## 2016-11-08 NOTE — NC FL2 (Signed)
[place] LEVEL OF CARE SCREENING TOOL     IDENTIFICATION  Patient Name: Julia [name] Birthdate: [birth year] Sex: female Admission Date (Current Location): 11/07/2016  [facility] and [place] Number:  Herbalist and Address:  [facility], [place] [address] [place], [place] [address]      Provider Number: [id number]  Attending Physician Name and Address:  Rosita Fire, MD  Relative Name and Phone Number:  daughter, [name]    Current Level of Care: Hospital Recommended Level of Care: [place] Prior Approval Number:    Date Approved/Denied: 11/08/16 PASRR Number: [id number]  Discharge Plan: SNF    Current Diagnoses: Patient Active Problem List   Diagnosis Date Noted  . Closed nondisplaced fracture of base of neck of right femur ([place]) 11/07/2016  . Sinus bradycardia 09/05/2014  . Junctional escape rhythm 09/05/2014  . Alzheimer's dementia 04/21/2014  . Overactive bladder 04/21/2014  . Hypothyroidism, acquired 11/21/2013  . PALPITATIONS, RECURRENT 12/09/2008  . Hyperlipidemia 04/18/2007  . Essential hypertension 12/11/2006  . GERD 12/11/2006  . Osteopenia 12/11/2006  . History of colonic polyps 12/11/2006    Orientation RESPIRATION BLADDER Height & Weight     Self, Time, Situation, Place  Normal Continent, Indwelling catheter Weight: 114 lb 15.9 oz (52.2 kg) Height:  5\' 3"  (160 cm)  BEHAVIORAL SYMPTOMS/MOOD NEUROLOGICAL BOWEL NUTRITION STATUS      Continent Diet  AMBULATORY STATUS COMMUNICATION OF NEEDS Skin   Limited Assist Verbally Surgical wounds (Right Closed Hip Incision, Hydrocolloid)                       Personal Care Assistance Level of Assistance  Bathing, Feeding, Dressing Bathing Assistance: Limited assistance Feeding assistance: Independent Dressing Assistance: Limited assistance     Functional Limitations Info  Sight, Hearing, Speech Sight Info: Adequate Hearing Info:  Adequate Speech Info: Adequate    SPECIAL CARE FACTORS FREQUENCY  PT (By licensed PT), OT (By licensed OT)     PT Frequency: 5xweek OT Frequency: 5xweek            Contractures      Additional Factors Info  Code Status, Psychotropic, Allergies Code Status Info: DNR Allergies Info: ENTEX, SULFONAMIDE DERIVATIVES  Psychotropic Info: seroquel, remron, namenda         Current Medications (11/08/2016):  This is the current hospital active medication list Current Facility-Administered Medications  Medication Dose Route Frequency Provider Last Rate Last Dose  . amLODipine (NORVASC) tablet 5 mg  5 mg Oral Daily Danford, Suann Larry, MD   5 mg at 11/08/16 0815  . aspirin EC tablet 325 mg  325 mg Oral Q breakfast Prudencio Burly III, PA-C   325 mg at 11/08/16 0815  . bisacodyl (DULCOLAX) suppository 10 mg  10 mg Rectal Daily PRN Danford, Suann Larry, MD      . docusate sodium (COLACE) capsule 100 mg  100 mg Oral BID Edwin Dada, MD   100 mg at 11/08/16 0815  . galantamine (RAZADYNE ER) 24 hr capsule 24 mg  24 mg Oral Q breakfast Edwin Dada, MD   24 mg at 11/08/16 0814  . HYDROcodone-acetaminophen (NORCO/VICODIN) 5-325 MG per tablet 1-2 tablet  1-2 tablet Oral Q6H PRN Danford, Suann Larry, MD      . lactated ringers infusion   Intravenous Continuous Prudencio Burly III, PA-C 100 mL/hr at 11/08/16 0016    . latanoprost (XALATAN) 0.005 % ophthalmic  solution 1 drop  1 drop Both Eyes QHS Danford, Suann Larry, MD   1 drop at 11/07/16 2129  . levothyroxine (SYNTHROID, LEVOTHROID) tablet 88 mcg  88 mcg Oral QAC breakfast Edwin Dada, MD   88 mcg at 11/08/16 0814  . memantine (NAMENDA XR) 24 hr capsule 28 mg  28 mg Oral Daily Edwin Dada, MD   28 mg at 11/08/16 0813  . mirabegron ER (MYRBETRIQ) tablet 25 mg  25 mg Oral Daily Edwin Dada, MD   25 mg at 11/08/16 0814  . mirtazapine (REMERON) tablet 15 mg  15 mg Oral QHS  Edwin Dada, MD   15 mg at 11/07/16 2125  . morphine 4 MG/ML injection 0.52 mg  0.52 mg Intravenous Q2H PRN Danford, Suann Larry, MD      . polyethylene glycol (MIRALAX / GLYCOLAX) packet 17 g  17 g Oral Daily PRN Danford, Suann Larry, MD      . QUEtiapine (SEROQUEL) tablet 25 mg  25 mg Oral QHS Edwin Dada, MD   25 mg at 11/07/16 2127  . rosuvastatin (CRESTOR) tablet 20 mg  20 mg Oral Q Mon Danford, Suann Larry, MD   20 mg at 11/07/16 5726     Discharge Medications: Please see discharge summary for a list of discharge medications.  Relevant Imaging Results:  Relevant Lab Results:   Additional Information SS: [id number]  Normajean Baxter, LCSW

## 2016-11-08 NOTE — Progress Notes (Signed)
   Assessment: 1 Day Post-Op  S/P Procedure(s) (LRB): RIGHT CANNULATED HIP PINNING (Right) by Dr. Ernesta Amble. Murphy on 11/07/2016  Principal Problem:   Closed nondisplaced fracture of base of neck of right femur ([place]) Active Problems:   Essential hypertension   Hypothyroidism, acquired   Alzheimer's dementia  Doing well POD1.  No pain.   Anticipate that she will be quick to mobilize.  Plan: Up with therapy Incentive Spirometry Apply ice  Weight Bearing: Weight Bearing as Tolerated (WBAT)  Dressings: PRN.  VTE prophylaxis: Aspirin, SCDs, ambulation Dispo: PT eval pending. The patient would like to be discharged home if appropriate. Previously ambulatory without aid. She has no stairs to negotiate at home. She has good family support.   Subjective: Patient reports pain as mild. Tolerating diet.  Urinating.   No CP, SOB.  Not yet OOB.  Objective:   VITALS:   Vitals:   11/07/16 1829 11/07/16 2043 11/08/16 0055 11/08/16 0613  BP: (!) 143/78 130/65 (!) 153/66 (!) 148/55  Pulse: 68 83 74 79  Resp: 12 18 18 16   Temp: 97.8 F (36.6 C) 98.3 F (36.8 C) 98.1 F (36.7 C) 98.2 F (36.8 C)  TempSrc: Oral Oral Oral Oral  SpO2: 100% 98% 98% 92%  Weight:      Height:       CBC Latest Ref Rng & Units 11/08/2016 11/07/2016 11/07/2016  WBC 4.0 - 10.5 K/uL 5.1 6.1 7.5  Hemoglobin 12.0 - 15.0 g/dL 12.7 14.7 14.4  Hematocrit 36.0 - 46.0 % 40.1 45.6 43.4  Platelets 150 - 400 K/uL 183 197 199   BMP Latest Ref Rng & Units 11/08/2016 11/07/2016 11/07/2016  Glucose 65 - 99 mg/dL 100(H) 115(H) 118(H)  BUN 6 - 20 mg/dL 11 14 16   Creatinine 0.44 - 1.00 mg/dL 1.22(H) 1.17(H) 1.23(H)  Sodium 135 - 145 mmol/L 140 138 139  Potassium 3.5 - 5.1 mmol/L 4.2 3.7 3.8  Chloride 101 - 111 mmol/L 106 103 104  CO2 22 - 32 mmol/L 28 28 27   Calcium 8.9 - 10.3 mg/dL 8.6(L) 9.2 9.3   Intake/Output      06/11 0701 - 06/12 0700   P.O. 100   I.V. (mL/kg) 973.3 (18.6)   Total Intake(mL/kg) 1073.3 (20.6)     Urine (mL/kg/hr) 1450 (1.2)   Blood 25 (0)   Total Output 1475   Net -401.7         Physical Exam: General: NAD.  No increased wob.  Pleasant, conversant. MSK Neurovascularly intact Sensation intact distally Intact pulses distally Dorsiflexion/Plantar flexion intact Incision: dressing C/D/I   Prudencio Burly III, PA-C 11/08/2016, 6:50 AM

## 2016-11-08 NOTE — Anesthesia Postprocedure Evaluation (Signed)
Anesthesia Post Note  Patient: Julia [name]  Procedure(s) Performed: Procedure(s) (LRB): RIGHT CANNULATED HIP PINNING (Right)     Patient location during evaluation: PACU Anesthesia Type: Spinal Level of consciousness: oriented and awake and alert Pain management: pain level controlled Vital Signs Assessment: post-procedure vital signs reviewed and stable Respiratory status: spontaneous breathing, respiratory function stable and patient connected to nasal cannula oxygen Cardiovascular status: blood pressure returned to baseline and stable Postop Assessment: no headache and no backache Anesthetic complications: no    Last Vitals:  Vitals:   11/08/16 0055 11/08/16 0613  BP: (!) 153/66 (!) 148/55  Pulse: 74 79  Resp: 18 16  Temp: 36.7 C 36.8 C    Last Pain:  Vitals:   11/08/16 0910  TempSrc:   PainSc: 0-No pain                 [name]

## 2016-11-08 NOTE — Evaluation (Signed)
Physical Therapy Evaluation Patient Details Name: Julia [name] MRN: [id number] DOB: [birth year] Today's Date: 11/08/2016   History of Present Illness  Pt is a 82 yo female, who presented to ED with R hip pain s/p mechanical fall. dx with R femur fx s/p R cannulated hip pinning 11/09/16. PMH significant for essential HTN, Hypothyroid, and Alzheimer's disease.    Clinical Impression  Patient is s/p above surgery resulting in functional limitations due to the deficits listed below (see PT Problem List). Pt is minAx1 for bed mobility, transfers with RW and ambulation of 10 feet with RW. Patient will benefit from skilled PT to increase their independence and safety with mobility to allow discharge to the venue listed below.       Follow Up Recommendations SNF    Equipment Recommendations  Other (comment) (to be determined at next venue)    Recommendations for Other Services       Precautions / Restrictions Precautions Precautions: Fall Restrictions Weight Bearing Restrictions: Yes RLE Weight Bearing: Weight bearing as tolerated      Mobility  Bed Mobility Overal bed mobility: Needs Assistance Bed Mobility: Supine to Sit     Supine to sit: Min assist     General bed mobility comments: minA for LE management to floor and pad scoot to EoB, vc for use of bedrails   Transfers Overall transfer level: Needs assistance Equipment used: Rolling walker (2 wheeled) Transfers: Sit to/from Stand Sit to Stand: Min assist         General transfer comment: minA for power up and steadying with RW, vc for hand placement for powerup and controlled descent,   Ambulation/Gait Ambulation/Gait assistance: Min assist Ambulation Distance (Feet): 10 Feet Assistive device: Rolling walker (2 wheeled) Gait Pattern/deviations: Step-to pattern;Decreased step length - left;Decreased stance time - right;Decreased weight shift to right;Shuffle;Antalgic Gait velocity: slowed Gait velocity  interpretation: Below normal speed for age/gender General Gait Details: minA for steadying, vc for sequencing and keeping inside the RW, upright posture and anterior pelvic tilt      Balance Overall balance assessment: Needs assistance Sitting-balance support: Feet supported;No upper extremity supported Sitting balance-Leahy Scale: Fair     Standing balance support: Bilateral upper extremity supported Standing balance-Leahy Scale: Poor Standing balance comment: requires RW for balance                             Pertinent Vitals/Pain Pain Assessment: Faces Faces Pain Scale: Hurts a little bit Pain Location: R hip  Pain Descriptors / Indicators: Grimacing;Guarding Pain Intervention(s): Monitored during session;Limited activity within patient's tolerance;Repositioned  VSS    Home Living Family/patient expects to be discharged to:: Private residence Living Arrangements: Spouse/significant other Available Help at Discharge: Family;Friend(s);Available 24 hours/day Type of Home: House Home Access: Ramped entrance     Home Layout: One level Home Equipment: Cane - single point;Shower seat;Grab bars - tub/shower      Prior Function Level of Independence: Independent         Comments: ADLs, IADLs, driving        Extremity/Trunk Assessment   Upper Extremity Assessment Upper Extremity Assessment: Overall WFL for tasks assessed    Lower Extremity Assessment Lower Extremity Assessment: RLE deficits/detail RLE Deficits / Details: R hip fx pinning, R hip and knee ROM limited by pain  RLE: Unable to fully assess due to pain    Cervical / Trunk Assessment Cervical / Trunk Assessment: Normal  Communication   Communication:  No difficulties  Cognition Arousal/Alertness: Awake/alert Behavior During Therapy: WFL for tasks assessed/performed Overall Cognitive Status: Within Functional Limits for tasks assessed                                                Assessment/Plan    PT Assessment Patient needs continued PT services  PT Problem List Decreased strength;Decreased range of motion;Decreased activity tolerance;Decreased balance;Decreased mobility;Decreased coordination;Decreased knowledge of use of DME;Decreased safety awareness;Pain       PT Treatment Interventions DME instruction;Gait training;Stair training;Functional mobility training;Therapeutic activities;Therapeutic exercise;Balance training;Patient/family education;Cognitive remediation    PT Goals (Current goals can be found in the Care Plan section)  Acute Rehab PT Goals Patient Stated Goal: wants to go home to be with her husband PT Goal Formulation: With patient/family Time For Goal Achievement: 11/22/16 Potential to Achieve Goals: Good    Frequency Min 3X/week    AM-PAC PT "6 Clicks" Daily Activity  Outcome Measure Difficulty turning over in bed (including adjusting bedclothes, sheets and blankets)?: Total Difficulty moving from lying on back to sitting on the side of the bed? : Total Difficulty sitting down on and standing up from a chair with arms (e.g., wheelchair, bedside commode, etc,.)?: Total Help needed moving to and from a bed to chair (including a wheelchair)?: A Little Help needed walking in hospital room?: A Little Help needed climbing 3-5 steps with a railing? : Total 6 Click Score: 10    End of Session Equipment Utilized During Treatment: Gait belt Activity Tolerance: Patient tolerated treatment well Patient left: in chair;with call bell/phone within reach;with family/visitor present Nurse Communication: Mobility status;Weight bearing status PT Visit Diagnosis: Unsteadiness on feet (R26.81);Other abnormalities of gait and mobility (R26.89);History of falling (Z91.81);Difficulty in walking, not elsewhere classified (R26.2);Pain;Other symptoms and signs involving the nervous system (R29.898) Pain - Right/Left: Right Pain - part of body:  Leg    Time: 1657-9038 PT Time Calculation (min) (ACUTE ONLY): 44 min   Charges:   PT Evaluation $PT Eval Low Complexity: 1 Procedure PT Treatments $Gait Training: 8-22 mins $Therapeutic Activity: 8-22 mins   PT G Codes:        Edelmira B. Migdalia Dk PT, DPT Acute Rehabilitation  [phone] Pager [phone]    [place] 11/08/2016, 11:49 AM

## 2016-11-09 LAB — TYPE AND SCREEN
ABO/RH(D): A NEG
Antibody Screen: POSITIVE
DAT, IGG: NEGATIVE
PT AG Type: POSITIVE
Unit division: 0
Unit division: 0

## 2016-11-09 LAB — BPAM RBC
Blood Product Expiration Date: 201806222359
Blood Product Expiration Date: 201806222359
UNIT TYPE AND RH: 600
UNIT TYPE AND RH: 600

## 2017-02-08 ENCOUNTER — Telehealth: Payer: Self-pay | Admitting: Adult Health

## 2017-02-08 MED ORDER — MEMANTINE HCL ER 28 MG PO CP24: 28.0000 mg | ORAL_CAPSULE | Freq: Every day | ORAL | 3 refills | Status: DC

## 2017-02-08 MED ORDER — GALANTAMINE HYDROBROMIDE ER 24 MG PO CP24: ORAL_CAPSULE | ORAL | 3 refills | Status: DC

## 2017-02-08 MED ORDER — AMLODIPINE BESYLATE 5 MG PO TABS: 5.0000 mg | ORAL_TABLET | Freq: Every day | ORAL | 3 refills | Status: DC

## 2017-02-08 NOTE — Telephone Encounter (Signed)
Spoke with pt's daughter and advised. Rx's sent to pharmacy. Nothing further needed.

## 2017-02-08 NOTE — Telephone Encounter (Signed)
Patient Name: Julia [name]  DOB: [birth year]    Initial Comment Caller states she is needing medications straight for her mother, pharmacy will not approve refills for her being in rehab. When released, one gave her refills, and pharmacy is saying not a valid doctor, so she switched doctors. Old MD: Judi Cong at [facility].    Nurse Assessment  Nurse: Leilani Merl, RN, Heather Date/Time (Eastern Time): 02/08/2017 9:05:21 AM  Confirm and document reason for call. If symptomatic, describe symptoms. ---Caller states she is needing medications refilled for her mother, pharmacy will not approve refills for her being in rehab. When released, one gave her refills, and pharmacy is saying not a valid doctor, so she switched doctors. Old MD: Judi Cong at [facility]. she needs 3 medications.  Does the patient have any new or worsening symptoms? ---No  Please document clinical information provided and list any resource used. ---caller would like someone from the office to call her and let her know if her mother needs to be seen before the medications can be refilled.     Guidelines    Guideline Title Affirmed Question Affirmed Notes       Final Disposition User

## 2017-02-08 NOTE — Telephone Encounter (Signed)
Ok to refill each for one year

## 2017-02-08 NOTE — Telephone Encounter (Signed)
Spoke with pt's daughter and she states that pt needs refills on  Galantamine Memantine Amlodipine  She reports that pt is doing well. She has had no further decline in her status.   Cory - Please advise if ok to refill.

## 2017-02-20 ENCOUNTER — Telehealth: Payer: Self-pay | Admitting: Adult Health

## 2017-02-20 NOTE — Telephone Encounter (Signed)
Pt request refill   QUEtiapine (SEROQUEL) 25 MG tablet  [facility] - [place], [place] - [address]  Pt just got of rehab from breaking her hip

## 2017-02-22 MED ORDER — QUETIAPINE FUMARATE 25 MG PO TABS: 25.0000 mg | ORAL_TABLET | Freq: Every day | ORAL | 1 refills | Status: DC

## 2017-02-22 NOTE — Telephone Encounter (Signed)
Sent to the pharmacy by e-scribe. 

## 2017-02-23 ENCOUNTER — Encounter: Payer: Self-pay | Admitting: Neurology

## 2017-03-01 ENCOUNTER — Telehealth: Payer: Self-pay | Admitting: Adult Health

## 2017-03-01 NOTE — Telephone Encounter (Signed)
Pt needs refills on mirtazapine 15 mg and levothyroxine 833mcg #90 w/refills send to [facility]

## 2017-03-02 MED ORDER — LEVOTHYROXINE SODIUM 88 MCG PO TABS: ORAL_TABLET | ORAL | 1 refills | Status: DC

## 2017-03-02 MED ORDER — MIRTAZAPINE 15 MG PO TABS: 15.0000 mg | ORAL_TABLET | Freq: Every day | ORAL | 1 refills | Status: DC

## 2017-03-02 NOTE — Telephone Encounter (Signed)
Sent to the pharmacy by e-scribe. 

## 2017-03-02 NOTE — Telephone Encounter (Signed)
Ok to refill both for 6 months. We will need to check her thyroid at some point soon

## 2017-03-02 NOTE — Telephone Encounter (Signed)
Julia [name] is doing Remeron.  No TSH since 07/2015.  Please advise.

## 2017-03-26 ENCOUNTER — Emergency Department (HOSPITAL_COMMUNITY): Payer: Medicare Other

## 2017-03-26 ENCOUNTER — Inpatient Hospital Stay (HOSPITAL_COMMUNITY)
Admission: EM | Admit: 2017-03-26 | Discharge: 2017-03-29 | DRG: 481 | Disposition: A | Discharge status: Skilled Nursing Facility | Payer: Medicare Other | Attending: Family Medicine | Admitting: Family Medicine

## 2017-03-26 ENCOUNTER — Encounter (HOSPITAL_COMMUNITY): Payer: Self-pay | Admitting: Emergency Medicine

## 2017-03-26 DIAGNOSIS — Z7982 Long term (current) use of aspirin: Secondary | ICD-10-CM

## 2017-03-26 DIAGNOSIS — Z90710 Acquired absence of both cervix and uterus: Secondary | ICD-10-CM

## 2017-03-26 DIAGNOSIS — E039 Hypothyroidism, unspecified: Secondary | ICD-10-CM | POA: Diagnosis present

## 2017-03-26 DIAGNOSIS — F0390 Unspecified dementia without behavioral disturbance: Secondary | ICD-10-CM | POA: Diagnosis present

## 2017-03-26 DIAGNOSIS — G309 Alzheimer's disease, unspecified: Secondary | ICD-10-CM | POA: Diagnosis present

## 2017-03-26 DIAGNOSIS — S72001A Fracture of unspecified part of neck of right femur, initial encounter for closed fracture: Secondary | ICD-10-CM

## 2017-03-26 DIAGNOSIS — F03A0 Unspecified dementia, mild, without behavioral disturbance, psychotic disturbance, mood disturbance, and anxiety: Secondary | ICD-10-CM | POA: Diagnosis present

## 2017-03-26 DIAGNOSIS — Z79890 Hormone replacement therapy (postmenopausal): Secondary | ICD-10-CM

## 2017-03-26 DIAGNOSIS — W19XXXA Unspecified fall, initial encounter: Secondary | ICD-10-CM

## 2017-03-26 DIAGNOSIS — Y92009 Unspecified place in unspecified non-institutional (private) residence as the place of occurrence of the external cause: Secondary | ICD-10-CM

## 2017-03-26 DIAGNOSIS — S72141A Displaced intertrochanteric fracture of right femur, initial encounter for closed fracture: Secondary | ICD-10-CM | POA: Diagnosis not present

## 2017-03-26 DIAGNOSIS — Z66 Do not resuscitate: Secondary | ICD-10-CM | POA: Diagnosis present

## 2017-03-26 DIAGNOSIS — K219 Gastro-esophageal reflux disease without esophagitis: Secondary | ICD-10-CM | POA: Diagnosis present

## 2017-03-26 DIAGNOSIS — Z419 Encounter for procedure for purposes other than remedying health state, unspecified: Secondary | ICD-10-CM

## 2017-03-26 DIAGNOSIS — D62 Acute posthemorrhagic anemia: Secondary | ICD-10-CM | POA: Diagnosis not present

## 2017-03-26 DIAGNOSIS — I10 Essential (primary) hypertension: Secondary | ICD-10-CM | POA: Diagnosis present

## 2017-03-26 DIAGNOSIS — W010XXA Fall on same level from slipping, tripping and stumbling without subsequent striking against object, initial encounter: Secondary | ICD-10-CM | POA: Diagnosis present

## 2017-03-26 DIAGNOSIS — E785 Hyperlipidemia, unspecified: Secondary | ICD-10-CM | POA: Diagnosis present

## 2017-03-26 DIAGNOSIS — F0280 Dementia in other diseases classified elsewhere without behavioral disturbance: Secondary | ICD-10-CM | POA: Diagnosis present

## 2017-03-26 DIAGNOSIS — M25552 Pain in left hip: Secondary | ICD-10-CM | POA: Diagnosis not present

## 2017-03-26 DIAGNOSIS — Z79899 Other long term (current) drug therapy: Secondary | ICD-10-CM

## 2017-03-26 DIAGNOSIS — D72829 Elevated white blood cell count, unspecified: Secondary | ICD-10-CM | POA: Diagnosis present

## 2017-03-26 LAB — CBC WITH DIFFERENTIAL/PLATELET
BASOS ABS: 0 10*3/uL (ref 0.0–0.1)
BASOS PCT: 0 %
EOS ABS: 0 10*3/uL (ref 0.0–0.7)
Eosinophils Relative: 0 %
HEMATOCRIT: 41.4 % (ref 36.0–46.0)
HEMOGLOBIN: 13.8 g/dL (ref 12.0–15.0)
Lymphocytes Relative: 9 %
Lymphs Abs: 1 10*3/uL (ref 0.7–4.0)
MCH: 29.6 pg (ref 26.0–34.0)
MCHC: 33.3 g/dL (ref 30.0–36.0)
MCV: 88.7 fL (ref 78.0–100.0)
Monocytes Absolute: 0.6 10*3/uL (ref 0.1–1.0)
Monocytes Relative: 5 %
NEUTROS ABS: 9.2 10*3/uL — AB (ref 1.7–7.7)
NEUTROS PCT: 86 %
Platelets: 212 10*3/uL (ref 150–400)
RBC: 4.67 MIL/uL (ref 3.87–5.11)
RDW: 14.4 % (ref 11.5–15.5)
WBC: 10.8 10*3/uL — AB (ref 4.0–10.5)

## 2017-03-26 LAB — BASIC METABOLIC PANEL
ANION GAP: 10 (ref 5–15)
BUN: 21 mg/dL — ABNORMAL HIGH (ref 6–20)
CALCIUM: 9 mg/dL (ref 8.9–10.3)
CHLORIDE: 103 mmol/L (ref 101–111)
CO2: 26 mmol/L (ref 22–32)
CREATININE: 1.01 mg/dL — AB (ref 0.44–1.00)
GFR calc Af Amer: 59 mL/min — ABNORMAL LOW (ref >60)
GFR calc non Af Amer: 51 mL/min — ABNORMAL LOW (ref >60)
Glucose, Bld: 129 mg/dL — ABNORMAL HIGH (ref 65–99)
POTASSIUM: 4 mmol/L (ref 3.5–5.1)
SODIUM: 139 mmol/L (ref 135–145)

## 2017-03-26 MED ORDER — FENTANYL CITRATE (PF) 100 MCG/2ML IJ SOLN
50.0000 ug | Freq: Once | INTRAMUSCULAR | Status: AC
  Administered 2017-03-26: 50 ug via INTRAVENOUS
  Filled 2017-03-26: qty 2

## 2017-03-26 NOTE — ED Notes (Signed)
Bed: WA18 Expected date:  Expected time:  Means of arrival:  Comments: EMS 

## 2017-03-26 NOTE — ED Provider Notes (Signed)
[facility] DEPT Provider Note   CSN: [id number] Arrival date & time: 03/26/17  2134    History   Chief Complaint Chief Complaint  Patient presents with  . Hip Pain  . Fall    HPI Julia [name] is a 82 y.o. female.  82 year old female with a history of hypertension, hyperlipidemia, Alzheimer's dementia, reflux, and osteopenia, s/p R cannulated hip pinning by Dr. Percell Miller on 11/07/16 presents to the ED after a fall this afternoon. Patient has been having some issues with balance lately, per family.  Patient states that she was bringing groceries into her home when she lost her balance and fell.  Family states that the primary impact was to the right hip.  Patient denies hitting her head or losing consciousness.  She had no lightheadedness or dizziness prior to her fall.  The patient has not been weightbearing since her fall.  She has had persistent pain in her right hip.  No extremity numbness or paresthesias.  She is not on chronic anticoagulation.  50 mcg of fentanyl given in route by EMS.   The history is provided by the patient and a relative. No language interpreter was used.  Hip Pain   Fall     Past Medical History:  Diagnosis Date  . Alzheimer's dementia   . Anxiety   . Concussion with no loss of consciousness 05/14/2008   Qualifier: Diagnosis of  By: Arnoldo Morale MD, Balinda Quails   . Dementia   . GERD (gastroesophageal reflux disease)   . History of colonic polyps   . Hx of echocardiogram    Echo 4/16:  EF 55-60%, mild LVH  . Hyperlipidemia   . Hypertension   . Hypothyroidism   . Osteopenia   . Right forearm fracture 2011  . Thyroid disease    hypothyroidism    Patient Active Problem List   Diagnosis Date Noted  . Closed fracture of right hip ([place])   . Closed nondisplaced fracture of base of neck of right femur ([place]) 11/07/2016  . Sinus bradycardia 09/05/2014  . Junctional escape rhythm 09/05/2014  . Alzheimer's dementia 04/21/2014    . Overactive bladder 04/21/2014  . Hypothyroidism, acquired 11/21/2013  . PALPITATIONS, RECURRENT 12/09/2008  . Hyperlipidemia 04/18/2007  . Essential hypertension 12/11/2006  . GERD 12/11/2006  . Osteopenia 12/11/2006  . History of colonic polyps 12/11/2006    Past Surgical History:  Procedure Laterality Date  . ABDOMINAL HYSTERECTOMY  2009  . BILATERAL SALPINGOOPHORECTOMY    . bladder tack    . CATARACT EXTRACTION Right at least a year ago  . HIP PINNING,CANNULATED Right 11/07/2016   Procedure: RIGHT CANNULATED HIP PINNING;  Surgeon: Renette Butters, MD;  Location: [facility];  Service: Orthopedics;  Laterality: Right;  . INCISION AND DRAINAGE OF WOUND Left 08/04/2015   Procedure: DEBRIDEMENT DISTAL INTERPHALANGEAL LEFT INDEX FINGER;  Surgeon: Daryll Brod, MD;  Location: [facility];  Service: Orthopedics;  Laterality: Left;  Marland Kitchen MASS EXCISION N/A 08/04/2015   Procedure: EXCISION MUCOID CYST;  Surgeon: Daryll Brod, MD;  Location: [facility];  Service: Orthopedics;  Laterality: N/A;  . MASS EXCISION Left 11/24/2015   Procedure: EXCISION RECURRENT  MUCOID CYST LEFT INDEX  FINGER;  Surgeon: Daryll Brod, MD;  Location: [facility];  Service: Orthopedics;  Laterality: Left;  . ROTATOR CUFF REPAIR     right shoulder  . surgical repair to fx left wrist    . TONSILLECTOMY  OB History    No data available       Home Medications    Prior to Admission medications   Medication Sig Start Date End Date Taking? Authorizing Provider  amLODipine (NORVASC) 5 MG tablet Take 1 tablet (5 mg total) by mouth daily. 02/08/17  Yes Nafziger, Tommi Rumps, NP  aspirin EC 325 MG tablet Take 1 tablet (325 mg total) by mouth daily. For 30 days post op for DVT Prophylaxis.  Resume 81 mg Aspirin when complete. 11/07/16  Yes Prudencio Burly III, PA-C  docusate sodium (COLACE) 100 MG capsule Take 1 capsule (100 mg total) by mouth 2 (two) times daily. To prevent constipation  while taking pain medication. Patient taking differently: Take 100 mg by mouth 2 (two) times daily as needed for mild constipation or moderate constipation. To prevent constipation while taking pain medication. 11/07/16  Yes Martensen, Charna Lorice III, PA-C  galantamine (RAZADYNE ER) 24 MG 24 hr capsule TAKE 1 CAPSULE BY MOUTH DAILY WITH BREAKFAST 02/08/17  Yes Nafziger, Tommi Rumps, NP  Ibuprofen 200 MG CAPS Take 1 capsule (200 mg total) by mouth daily as needed. Patient taking differently: Take 200 mg by mouth daily.  09/06/14  Yes Weaver, Scott T, PA-C  latanoprost (XALATAN) 0.005 % ophthalmic solution Place 1 drop into both eyes at bedtime.   Yes [provider]  levothyroxine (SYNTHROID, LEVOTHROID) 88 MCG tablet TAKE 1 TABLET BY MOUTH DAILY BEFORE BREAKFAST Patient taking differently: Take 88 mcg by mouth daily before breakfast.  03/02/17  Yes Nafziger, Tommi Rumps, NP  memantine (NAMENDA XR) 28 MG CP24 24 hr capsule Take 1 capsule (28 mg total) by mouth daily. 02/08/17  Yes Nafziger, Tommi Rumps, NP  mirabegron ER (MYRBETRIQ) 25 MG TB24 tablet Take 25 mg by mouth daily.   Yes [provider]  mirtazapine (REMERON) 15 MG tablet Take 1 tablet (15 mg total) by mouth at bedtime. 03/02/17  Yes Nafziger, Tommi Rumps, NP  Multiple Vitamin (MULTIVITAMIN) tablet Take 1 tablet by mouth daily.   Yes [provider]  Omega-3 Fatty Acids (FISH OIL) 1000 MG CAPS Take 1,000 mg by mouth daily.   Yes [provider]  omeprazole (PRILOSEC) 20 MG capsule Take 1 capsule (20 mg total) by mouth daily. While taking anti inflammatory medicine daily 11/07/16  Yes Martensen, Charna Lynley III, PA-C  polyethylene glycol (MIRALAX / GLYCOLAX) packet Take 17 g by mouth daily as needed for mild constipation. Reported on 10/17/2015   Yes [provider]  QUEtiapine (SEROQUEL) 25 MG tablet Take 1 tablet (25 mg total) by mouth at bedtime. 02/22/17  Yes Nafziger, Tommi Rumps, NP  rosuvastatin (CRESTOR) 20 MG tablet Take 1  tablet (20 mg total) by mouth once a week. 09/12/14  Yes Marin Olp, MD  HYDROcodone-acetaminophen (NORCO) 5-325 MG tablet Take 1-2 tablets by mouth every 6 (six) hours as needed for moderate pain. Patient not taking: Reported on 03/27/2017 11/07/16   Prudencio Burly III, PA-C  triamcinolone cream (KENALOG) 0.1 % Apply 1 application topically 2 (two) times daily. Patient not taking: Reported on 03/27/2017 10/17/15   Robyn Haber, MD    Family History Family History  Problem Relation Age of Onset  . Hypertension Mother   . COPD Mother   . Tuberculosis Mother        x 2  . Lung cancer Father   . Stomach cancer Father   . Heart disease Brother   . Drug abuse Maternal Uncle   . Cancer Maternal Uncle   .  Alzheimer's disease Maternal Aunt   . Colon cancer Neg Hx   . Esophageal cancer Neg Hx   . Rectal cancer Neg Hx   . Heart attack Neg Hx   . Stroke Neg Hx     Social History Social History  Substance Use Topics  . Smoking status: Never Smoker  . Smokeless tobacco: Never Used  . Alcohol use No     Allergies   Entex and Sulfonamide derivatives   Review of Systems Review of Systems Ten systems reviewed and are negative for acute change, except as noted in the HPI.    Physical Exam Updated Vital Signs BP (!) 143/62 (BP Location: Left Arm)   Pulse (!) 59   Temp 97.6 F (36.4 C) (Oral)   Resp 18   SpO2 96%   Physical Exam  Constitutional: She is oriented to person, place, and time. She appears well-developed and well-nourished. No distress.  Nontoxic and in NAD  HENT:  Head: Normocephalic and atraumatic.  Eyes: Conjunctivae and EOM are normal. No scleral icterus.  Neck: Normal range of motion.  Cardiovascular: Normal rate, regular rhythm and intact distal pulses.   DP pulse 2+ in the RLE  Pulmonary/Chest: Effort normal. No respiratory distress. She has no wheezes.  Respirations even and unlabored  Musculoskeletal: Normal range of motion.  TTP to  the R hip without crepitus. Mild shortening of the RLE.   Neurological: She is alert and oriented to person, place, and time.  Patient able to wiggle all toes. Sensation to light touch intact.  Skin: Skin is warm and dry. No rash noted. She is not diaphoretic. No erythema. No pallor.  Psychiatric: She has a normal mood and affect. Her behavior is normal.  Nursing note and vitals reviewed.    ED Treatments / Results  Labs (all labs ordered are listed, but only abnormal results are displayed) Labs Reviewed  CBC WITH DIFFERENTIAL/PLATELET - Abnormal; Notable for the following:       Result Value   WBC 10.8 (*)    Neutro Abs 9.2 (*)    All other components within normal limits  BASIC METABOLIC PANEL - Abnormal; Notable for the following:    Glucose, Bld 129 (*)    BUN 21 (*)    Creatinine, Ser 1.01 (*)    GFR calc non Af Amer 51 (*)    GFR calc Af Amer 59 (*)    All other components within normal limits    EKG  EKG Interpretation None       Radiology Dg Chest 1 View  Result Date: 03/26/2017 CLINICAL DATA:  82 year old female with fall and probable right hip fracture. EXAM: CHEST 1 VIEW COMPARISON:  Chest radiograph dated 11/07/2016 FINDINGS: There is mild diffuse interstitial coarsening, chronic. No focal consolidation, pleural effusion, or pneumothorax. The cardiac silhouette is within normal limits. There is osteopenia with degenerative changes of the spine. No acute osseous pathology. IMPRESSION: No active disease. Electronically Signed   By: Anner Crete M.D.   On: 03/26/2017 22:41   Dg Hip Unilat  With Pelvis 2-3 Views Right  Result Date: 03/26/2017 CLINICAL DATA:  Fall tonight with right hip pain. EXAM: DG HIP (WITH OR WITHOUT PELVIS) 2-3V RIGHT COMPARISON:  Postoperative radiograph 11/07/2016 FINDINGS: Three partially cannulated screws traverse the intertrochanteric femur site fixating prior femoral neck fracture. Acute displaced fracture of the right proximal femur  is likely intertrochanteric, fracture may extend through the proximal aspect of the screws. The femoral head remains seated. Pubic  rami are intact. Pubic symphysis is congruent. IMPRESSION: Acute proximal femur fracture, likely intertrochanteric and displaced. Fracture may be through proximal femoral screws traversing prior femoral neck fracture. CT would provide better definition of fracture planes. Electronically Signed   By: Jeb Levering M.D.   On: 03/26/2017 22:39    Procedures Procedures (including critical care time)  Medications Ordered in ED Medications  fentaNYL (SUBLIMAZE) injection 50 mcg (50 mcg Intravenous Given 03/26/17 2335)     Initial Impression / Assessment and Plan / ED Course  I have reviewed the triage vital signs and the nursing notes.  Pertinent labs & imaging results that were available during my care of the patient were reviewed by me and considered in my medical decision making (see chart for details).     53:60 PM 82 year old female presents after a fall at home with right hip pain.  She is neurovascularly intact on exam.  X-ray shows acute proximal intertrochanteric femur fracture.  Case discussed with Dr. Doreatha Martin on-call for Raliegh Ip.  Dr. Doreatha Martin or Dr. Percell Miller to assess the patient in the morning.  Dr. Doreatha Martin requests transfer to [facility] and admission to the hospitalist service.  Will consult triad.  12:32 AM Case discussed with Dr. Tamala Julian. EMTALA completed for transfer to [facility] for admission.   Final Clinical Impressions(s) / ED Diagnoses   Final diagnoses:  Closed displaced intertrochanteric fracture of right femur, initial encounter [facility])    New Prescriptions New Prescriptions   No medications on file     Antonietta Breach, PA-C 03/27/17 0032    Fredia Sorrow, MD 04/01/17 [phone]

## 2017-03-26 NOTE — ED Triage Notes (Signed)
Pt comes from home via EMS with complaints of a fall injuring her right hip after losing her balance. Denies LOC or hitting her head.  No blood thinners. Had hairline fracture back in July.  Pulses intact.  Vitals BP 170/64, 96% RA, 60 HR, CBG 143. 50 mcg of fentanyl given in route. A&O x4.

## 2017-03-26 NOTE — ED Provider Notes (Signed)
Medical screening examination/treatment/procedure(s) were conducted as a shared visit with non-physician practitioner(s) and myself.  I personally evaluated the patient during the encounter.   EKG Interpretation None       Patient seen by me along with the physician assistant.  Patient had a fall where her right leg gave out.  Lost her balance.  Patient with pain to the right hip area.  Patient status post fracture to that hip back in June.  Repaired by Percell Miller.  X-rays show evidence of probable anterior [place]pe fracture involving some of the screws.  Discussed with orthopedics.  They want patient transferred to [facility].  Hospitalist will do the admission.  Results for orders placed or performed during the hospital encounter of 03/26/17  CBC with Differential  Result Value Ref Range   WBC 10.8 (H) 4.0 - 10.5 K/uL   RBC 4.67 3.87 - 5.11 MIL/uL   Hemoglobin 13.8 12.0 - 15.0 g/dL   HCT 41.4 36.0 - 46.0 %   MCV 88.7 78.0 - 100.0 fL   MCH 29.6 26.0 - 34.0 pg   MCHC 33.3 30.0 - 36.0 g/dL   RDW 14.4 11.5 - 15.5 %   Platelets 212 150 - 400 K/uL   Neutrophils Relative % 86 %   Neutro Abs 9.2 (H) 1.7 - 7.7 K/uL   Lymphocytes Relative 9 %   Lymphs Abs 1.0 0.7 - 4.0 K/uL   Monocytes Relative 5 %   Monocytes Absolute 0.6 0.1 - 1.0 K/uL   Eosinophils Relative 0 %   Eosinophils Absolute 0.0 0.0 - 0.7 K/uL   Basophils Relative 0 %   Basophils Absolute 0.0 0.0 - 0.1 K/uL   Dg Chest 1 View  Result Date: 03/26/2017 CLINICAL DATA:  82 year old female with fall and probable right hip fracture. EXAM: CHEST 1 VIEW COMPARISON:  Chest radiograph dated 11/07/2016 FINDINGS: There is mild diffuse interstitial coarsening, chronic. No focal consolidation, pleural effusion, or pneumothorax. The cardiac silhouette is within normal limits. There is osteopenia with degenerative changes of the spine. No acute osseous pathology. IMPRESSION: No active disease. Electronically Signed   By: Anner Crete M.D.   On:  03/26/2017 22:41   Dg Hip Unilat  With Pelvis 2-3 Views Right  Result Date: 03/26/2017 CLINICAL DATA:  Fall tonight with right hip pain. EXAM: DG HIP (WITH OR WITHOUT PELVIS) 2-3V RIGHT COMPARISON:  Postoperative radiograph 11/07/2016 FINDINGS: Three partially cannulated screws traverse the intertrochanteric femur site fixating prior femoral neck fracture. Acute displaced fracture of the right proximal femur is likely intertrochanteric, fracture may extend through the proximal aspect of the screws. The femoral head remains seated. Pubic rami are intact. Pubic symphysis is congruent. IMPRESSION: Acute proximal femur fracture, likely intertrochanteric and displaced. Fracture may be through proximal femoral screws traversing prior femoral neck fracture. CT would provide better definition of fracture planes. Electronically Signed   By: Jeb Levering M.D.   On: 03/26/2017 22:39       Fredia Sorrow, MD 03/27/17 0000

## 2017-03-27 ENCOUNTER — Inpatient Hospital Stay (HOSPITAL_COMMUNITY): Payer: Medicare Other

## 2017-03-27 ENCOUNTER — Inpatient Hospital Stay (HOSPITAL_COMMUNITY): Payer: Medicare Other | Admitting: Certified Registered Nurse Anesthetist

## 2017-03-27 ENCOUNTER — Encounter (HOSPITAL_COMMUNITY): Payer: Self-pay | Admitting: *Deleted

## 2017-03-27 ENCOUNTER — Encounter (HOSPITAL_COMMUNITY)
Admission: EM | Disposition: A | Discharge status: Skilled Nursing Facility | Payer: Self-pay | Source: Home / Self Care | Attending: Family Medicine

## 2017-03-27 DIAGNOSIS — Y92009 Unspecified place in unspecified non-institutional (private) residence as the place of occurrence of the external cause: Secondary | ICD-10-CM | POA: Diagnosis not present

## 2017-03-27 DIAGNOSIS — D72829 Elevated white blood cell count, unspecified: Secondary | ICD-10-CM | POA: Diagnosis not present

## 2017-03-27 DIAGNOSIS — F0390 Unspecified dementia without behavioral disturbance: Secondary | ICD-10-CM | POA: Diagnosis not present

## 2017-03-27 DIAGNOSIS — Z79899 Other long term (current) drug therapy: Secondary | ICD-10-CM | POA: Diagnosis not present

## 2017-03-27 DIAGNOSIS — F0280 Dementia in other diseases classified elsewhere without behavioral disturbance: Secondary | ICD-10-CM | POA: Diagnosis present

## 2017-03-27 DIAGNOSIS — K219 Gastro-esophageal reflux disease without esophagitis: Secondary | ICD-10-CM | POA: Diagnosis present

## 2017-03-27 DIAGNOSIS — Z90710 Acquired absence of both cervix and uterus: Secondary | ICD-10-CM | POA: Diagnosis not present

## 2017-03-27 DIAGNOSIS — Z7982 Long term (current) use of aspirin: Secondary | ICD-10-CM | POA: Diagnosis not present

## 2017-03-27 DIAGNOSIS — E039 Hypothyroidism, unspecified: Secondary | ICD-10-CM | POA: Diagnosis present

## 2017-03-27 DIAGNOSIS — M25552 Pain in left hip: Secondary | ICD-10-CM | POA: Diagnosis present

## 2017-03-27 DIAGNOSIS — W010XXA Fall on same level from slipping, tripping and stumbling without subsequent striking against object, initial encounter: Secondary | ICD-10-CM | POA: Diagnosis present

## 2017-03-27 DIAGNOSIS — S72141A Displaced intertrochanteric fracture of right femur, initial encounter for closed fracture: Secondary | ICD-10-CM | POA: Diagnosis present

## 2017-03-27 DIAGNOSIS — I10 Essential (primary) hypertension: Secondary | ICD-10-CM | POA: Diagnosis present

## 2017-03-27 DIAGNOSIS — Z66 Do not resuscitate: Secondary | ICD-10-CM | POA: Diagnosis present

## 2017-03-27 DIAGNOSIS — W19XXXA Unspecified fall, initial encounter: Secondary | ICD-10-CM | POA: Diagnosis not present

## 2017-03-27 DIAGNOSIS — G309 Alzheimer's disease, unspecified: Secondary | ICD-10-CM | POA: Diagnosis present

## 2017-03-27 DIAGNOSIS — E785 Hyperlipidemia, unspecified: Secondary | ICD-10-CM | POA: Diagnosis present

## 2017-03-27 DIAGNOSIS — D62 Acute posthemorrhagic anemia: Secondary | ICD-10-CM | POA: Diagnosis not present

## 2017-03-27 DIAGNOSIS — Z79890 Hormone replacement therapy (postmenopausal): Secondary | ICD-10-CM | POA: Diagnosis not present

## 2017-03-27 DIAGNOSIS — F03A0 Unspecified dementia, mild, without behavioral disturbance, psychotic disturbance, mood disturbance, and anxiety: Secondary | ICD-10-CM | POA: Diagnosis present

## 2017-03-27 HISTORY — PX: FEMUR IM NAIL: SHX1597

## 2017-03-27 LAB — SURGICAL PCR SCREEN
MRSA, PCR: NEGATIVE
Staphylococcus aureus: NEGATIVE

## 2017-03-27 SURGERY — INSERTION, INTRAMEDULLARY ROD, FEMUR
Anesthesia: Spinal | Site: Hip | Laterality: Right

## 2017-03-27 MED ORDER — ONDANSETRON HCL 4 MG/2ML IJ SOLN
INTRAMUSCULAR | Status: DC | PRN
  Administered 2017-03-27: 4 mg via INTRAVENOUS

## 2017-03-27 MED ORDER — SODIUM CHLORIDE 0.9 % IV SOLN
INTRAVENOUS | Status: DC
  Administered 2017-03-27: 04:00:00 via INTRAVENOUS

## 2017-03-27 MED ORDER — DOCUSATE SODIUM 100 MG PO CAPS: 100.0000 mg | ORAL_CAPSULE | Freq: Two times a day (BID) | ORAL | Status: DC | PRN

## 2017-03-27 MED ORDER — FENTANYL CITRATE (PF) 100 MCG/2ML IJ SOLN
INTRAMUSCULAR | Status: DC | PRN
  Administered 2017-03-27: 50 ug via INTRAVENOUS

## 2017-03-27 MED ORDER — 0.9 % SODIUM CHLORIDE (POUR BTL) OPTIME
TOPICAL | Status: DC | PRN
  Administered 2017-03-27: 1000 mL

## 2017-03-27 MED ORDER — ROSUVASTATIN CALCIUM 10 MG PO TABS
20.0000 mg | ORAL_TABLET | Freq: Every day | ORAL | Status: DC
  Administered 2017-03-28: 20 mg via ORAL
  Filled 2017-03-27: qty 2
  Filled 2017-03-27: qty 1

## 2017-03-27 MED ORDER — PHENYLEPHRINE 40 MCG/ML (10ML) SYRINGE FOR IV PUSH (FOR BLOOD PRESSURE SUPPORT)
PREFILLED_SYRINGE | INTRAVENOUS | Status: DC | PRN
  Administered 2017-03-27: 80 ug via INTRAVENOUS
  Administered 2017-03-27: 120 ug via INTRAVENOUS
  Administered 2017-03-27: 80 ug via INTRAVENOUS

## 2017-03-27 MED ORDER — MIRTAZAPINE 15 MG PO TABS
15.0000 mg | ORAL_TABLET | Freq: Every day | ORAL | Status: DC
  Administered 2017-03-27 – 2017-03-28 (×2): 15 mg via ORAL
  Filled 2017-03-27: qty 1
  Filled 2017-03-27: qty 0.5
  Filled 2017-03-27: qty 1

## 2017-03-27 MED ORDER — AMLODIPINE BESYLATE 5 MG PO TABS
5.0000 mg | ORAL_TABLET | Freq: Every day | ORAL | Status: DC
  Administered 2017-03-27 – 2017-03-29 (×3): 5 mg via ORAL
  Filled 2017-03-27 (×3): qty 1

## 2017-03-27 MED ORDER — POLYETHYLENE GLYCOL 3350 17 G PO PACK: 17.0000 g | PACK | Freq: Every day | ORAL | Status: DC | PRN

## 2017-03-27 MED ORDER — DEXAMETHASONE SODIUM PHOSPHATE 10 MG/ML IJ SOLN
INTRAMUSCULAR | Status: DC | PRN
  Administered 2017-03-27: 10 mg via INTRAVENOUS

## 2017-03-27 MED ORDER — LACTATED RINGERS IV SOLN
INTRAVENOUS | Status: DC
  Administered 2017-03-27: via INTRAVENOUS

## 2017-03-27 MED ORDER — CEFAZOLIN SODIUM-DEXTROSE 2-3 GM-%(50ML) IV SOLR
INTRAVENOUS | Status: DC | PRN
  Administered 2017-03-27: 2 g via INTRAVENOUS

## 2017-03-27 MED ORDER — PANTOPRAZOLE SODIUM 40 MG PO TBEC
40.0000 mg | DELAYED_RELEASE_TABLET | Freq: Every day | ORAL | Status: DC
  Administered 2017-03-27 – 2017-03-29 (×3): 40 mg via ORAL
  Filled 2017-03-27 (×3): qty 1

## 2017-03-27 MED ORDER — ACETAMINOPHEN 325 MG PO TABS
650.0000 mg | ORAL_TABLET | Freq: Four times a day (QID) | ORAL | Status: DC | PRN
  Administered 2017-03-28 (×2): 650 mg via ORAL
  Filled 2017-03-27 (×2): qty 2

## 2017-03-27 MED ORDER — DOCUSATE SODIUM 100 MG PO CAPS: 100.0000 mg | ORAL_CAPSULE | Freq: Two times a day (BID) | ORAL | 0 refills | Status: DC

## 2017-03-27 MED ORDER — FENTANYL CITRATE (PF) 250 MCG/5ML IJ SOLN
INTRAMUSCULAR | Status: AC
  Filled 2017-03-27: qty 5

## 2017-03-27 MED ORDER — ENOXAPARIN SODIUM 40 MG/0.4ML ~~LOC~~ SOLN
40.0000 mg | SUBCUTANEOUS | Status: DC
  Administered 2017-03-27 – 2017-03-28 (×2): 40 mg via SUBCUTANEOUS
  Filled 2017-03-27 (×2): qty 0.4

## 2017-03-27 MED ORDER — BUPIVACAINE IN DEXTROSE 0.75-8.25 % IT SOLN
INTRATHECAL | Status: DC | PRN
  Administered 2017-03-27: 11 mg via INTRATHECAL

## 2017-03-27 MED ORDER — FENTANYL CITRATE (PF) 100 MCG/2ML IJ SOLN: 25.0000 ug | INTRAMUSCULAR | Status: DC | PRN

## 2017-03-27 MED ORDER — MEPERIDINE HCL 25 MG/ML IJ SOLN: 6.2500 mg | INTRAMUSCULAR | Status: DC | PRN

## 2017-03-27 MED ORDER — EPHEDRINE SULFATE-NACL 50-0.9 MG/10ML-% IV SOSY
PREFILLED_SYRINGE | INTRAVENOUS | Status: DC | PRN
  Administered 2017-03-27 (×2): 5 mg via INTRAVENOUS

## 2017-03-27 MED ORDER — PROPOFOL 10 MG/ML IV BOLUS
INTRAVENOUS | Status: DC | PRN
  Administered 2017-03-27: 10 mg via INTRAVENOUS

## 2017-03-27 MED ORDER — LEVOTHYROXINE SODIUM 88 MCG PO TABS
88.0000 ug | ORAL_TABLET | Freq: Every day | ORAL | Status: DC
  Administered 2017-03-28 – 2017-03-29 (×2): 88 ug via ORAL
  Filled 2017-03-27 (×3): qty 1

## 2017-03-27 MED ORDER — HYDROCODONE-ACETAMINOPHEN 5-325 MG PO TABS
1.0000 | ORAL_TABLET | Freq: Four times a day (QID) | ORAL | Status: DC | PRN
  Administered 2017-03-27: 2 via ORAL
  Administered 2017-03-28 – 2017-03-29 (×4): 1 via ORAL
  Filled 2017-03-27 (×3): qty 1
  Filled 2017-03-27: qty 2
  Filled 2017-03-27: qty 1

## 2017-03-27 MED ORDER — LACTATED RINGERS IV SOLN
INTRAVENOUS | Status: DC
  Administered 2017-03-27 (×2): via INTRAVENOUS

## 2017-03-27 MED ORDER — ONDANSETRON HCL 4 MG PO TABS: 4.0000 mg | ORAL_TABLET | Freq: Three times a day (TID) | ORAL | 0 refills | Status: DC | PRN

## 2017-03-27 MED ORDER — LIDOCAINE 2% (20 MG/ML) 5 ML SYRINGE
INTRAMUSCULAR | Status: AC
  Filled 2017-03-27: qty 10

## 2017-03-27 MED ORDER — QUETIAPINE FUMARATE 25 MG PO TABS
25.0000 mg | ORAL_TABLET | Freq: Every day | ORAL | Status: DC
  Administered 2017-03-27 – 2017-03-28 (×2): 25 mg via ORAL
  Filled 2017-03-27 (×3): qty 1

## 2017-03-27 MED ORDER — OMEPRAZOLE 20 MG PO CPDR: 20.0000 mg | DELAYED_RELEASE_CAPSULE | Freq: Every day | ORAL | 0 refills | Status: DC

## 2017-03-27 MED ORDER — MEMANTINE HCL ER 28 MG PO CP24
28.0000 mg | ORAL_CAPSULE | Freq: Every day | ORAL | Status: DC
  Administered 2017-03-27: 28 mg via ORAL
  Filled 2017-03-27 (×2): qty 1

## 2017-03-27 MED ORDER — ACETAMINOPHEN 650 MG RE SUPP: 650.0000 mg | Freq: Four times a day (QID) | RECTAL | Status: DC | PRN

## 2017-03-27 MED ORDER — PROPOFOL 500 MG/50ML IV EMUL
INTRAVENOUS | Status: DC | PRN
  Administered 2017-03-27: 50 ug/kg/min via INTRAVENOUS

## 2017-03-27 MED ORDER — MORPHINE SULFATE (PF) 2 MG/ML IV SOLN
0.5000 mg | INTRAVENOUS | Status: DC | PRN
  Administered 2017-03-27: 0.5 mg via INTRAVENOUS

## 2017-03-27 MED ORDER — ASPIRIN EC 325 MG PO TBEC: 325.0000 mg | DELAYED_RELEASE_TABLET | Freq: Every day | ORAL | 0 refills | Status: DC

## 2017-03-27 MED ORDER — GALANTAMINE HYDROBROMIDE ER 8 MG PO CP24
24.0000 mg | ORAL_CAPSULE | Freq: Every day | ORAL | Status: DC
  Administered 2017-03-27 – 2017-03-29 (×3): 24 mg via ORAL
  Filled 2017-03-27 (×3): qty 3

## 2017-03-27 MED ORDER — EPHEDRINE 5 MG/ML INJ
INTRAVENOUS | Status: AC
  Filled 2017-03-27: qty 10

## 2017-03-27 MED ORDER — CEFAZOLIN SODIUM 1 G IJ SOLR
INTRAMUSCULAR | Status: AC
  Filled 2017-03-27: qty 20

## 2017-03-27 MED ORDER — HYDROCODONE-ACETAMINOPHEN 5-325 MG PO TABS: 1.0000 | ORAL_TABLET | Freq: Four times a day (QID) | ORAL | 0 refills | Status: DC | PRN

## 2017-03-27 MED ORDER — PHENYLEPHRINE 40 MCG/ML (10ML) SYRINGE FOR IV PUSH (FOR BLOOD PRESSURE SUPPORT)
PREFILLED_SYRINGE | INTRAVENOUS | Status: AC
  Filled 2017-03-27: qty 10

## 2017-03-27 MED ORDER — MIRABEGRON ER 25 MG PO TB24
25.0000 mg | ORAL_TABLET | Freq: Every day | ORAL | Status: DC
  Administered 2017-03-27 – 2017-03-29 (×3): 25 mg via ORAL
  Filled 2017-03-27 (×3): qty 1

## 2017-03-27 MED ORDER — CEFAZOLIN SODIUM-DEXTROSE 2-4 GM/100ML-% IV SOLN
2.0000 g | Freq: Four times a day (QID) | INTRAVENOUS | Status: AC
  Administered 2017-03-27 – 2017-03-28 (×2): 2 g via INTRAVENOUS
  Filled 2017-03-27 (×2): qty 100

## 2017-03-27 MED ORDER — PHENYLEPHRINE 40 MCG/ML (10ML) SYRINGE FOR IV PUSH (FOR BLOOD PRESSURE SUPPORT)
PREFILLED_SYRINGE | INTRAVENOUS | Status: AC
  Filled 2017-03-27: qty 20

## 2017-03-27 MED ORDER — LATANOPROST 0.005 % OP SOLN
1.0000 [drp] | Freq: Every day | OPHTHALMIC | Status: DC
  Administered 2017-03-28 (×2): 1 [drp] via OPHTHALMIC
  Filled 2017-03-27 (×2): qty 2.5

## 2017-03-27 MED ORDER — MORPHINE SULFATE (PF) 2 MG/ML IV SOLN
INTRAVENOUS | Status: AC
  Filled 2017-03-27: qty 1

## 2017-03-27 MED ORDER — PHENYLEPHRINE HCL 10 MG/ML IJ SOLN
INTRAVENOUS | Status: DC | PRN
  Administered 2017-03-27: 20 ug/min via INTRAVENOUS

## 2017-03-27 MED ORDER — MORPHINE SULFATE (PF) 4 MG/ML IV SOLN: 0.5000 mg | INTRAVENOUS | Status: DC | PRN

## 2017-03-27 SURGICAL SUPPLY — 44 items
BIT DRILL AO GAMMA 4.2X180 (BIT) ×2 IMPLANT
BNDG GAUZE ELAST 4 BULKY (GAUZE/BANDAGES/DRESSINGS) ×2 IMPLANT
CLOSURE STERI-STRIP 1/2X4 (GAUZE/BANDAGES/DRESSINGS)
CLSR STERI-STRIP ANTIMIC 1/2X4 (GAUZE/BANDAGES/DRESSINGS) ×1 IMPLANT
COVER MAYO STAND STRL (DRAPES) ×2 IMPLANT
COVER PERINEAL POST (MISCELLANEOUS) ×3 IMPLANT
COVER SURGICAL LIGHT HANDLE (MISCELLANEOUS) ×3 IMPLANT
DRAPE STERI IOBAN 125X83 (DRAPES) ×3 IMPLANT
DRSG MEPILEX BORDER 4X4 (GAUZE/BANDAGES/DRESSINGS) ×8 IMPLANT
DURAPREP 26ML APPLICATOR (WOUND CARE) ×3 IMPLANT
ELECT REM PT RETURN 9FT ADLT (ELECTROSURGICAL) ×3
ELECTRODE REM PT RTRN 9FT ADLT (ELECTROSURGICAL) ×1 IMPLANT
GLOVE BIO SURGEON STRL SZ 6.5 (GLOVE) ×1 IMPLANT
GLOVE BIO SURGEON STRL SZ7.5 (GLOVE) ×6 IMPLANT
GLOVE BIO SURGEONS STRL SZ 6.5 (GLOVE) ×1
GLOVE BIOGEL PI IND STRL 6.5 (GLOVE) IMPLANT
GLOVE BIOGEL PI IND STRL 8 (GLOVE) ×2 IMPLANT
GLOVE BIOGEL PI INDICATOR 6.5 (GLOVE) ×2
GLOVE BIOGEL PI INDICATOR 8 (GLOVE) ×4
GOWN STRL REUS W/ TWL LRG LVL3 (GOWN DISPOSABLE) ×3 IMPLANT
GOWN STRL REUS W/TWL LRG LVL3 (GOWN DISPOSABLE) ×9
GUIDEROD T2 3X1000 (ROD) ×2 IMPLANT
GUIDEWIRE ASNIS 3.2 NONCAL (WIRE) ×4 IMPLANT
K-WIRE  3.2X450M STR (WIRE) ×2
K-WIRE 3.2X450M STR (WIRE) ×1
KIT BASIN OR (CUSTOM PROCEDURE TRAY) ×3 IMPLANT
KIT ROOM TURNOVER OR (KITS) ×3 IMPLANT
KWIRE 3.2X450M STR (WIRE) IMPLANT
MANIFOLD NEPTUNE II (INSTRUMENTS) ×3 IMPLANT
NAIL GAMMA LG R 5TI 10X360X125 (Nail) ×2 IMPLANT
NS IRRIG 1000ML POUR BTL (IV SOLUTION) ×3 IMPLANT
PACK GENERAL/GYN (CUSTOM PROCEDURE TRAY) ×3 IMPLANT
PAD ARMBOARD 7.5X6 YLW CONV (MISCELLANEOUS) ×6 IMPLANT
SCREW LAG GAMMA 3 TI 10.5X90MM (Screw) ×2 IMPLANT
SCREW LOCKING T2 F/T  5MMX40MM (Screw) ×2 IMPLANT
SCREW LOCKING T2 F/T 5MMX40MM (Screw) IMPLANT
SUT ETHILON 3 0 PS 1 (SUTURE) ×4 IMPLANT
SUT MNCRL AB 4-0 PS2 18 (SUTURE) IMPLANT
SUT MON AB 2-0 CT1 36 (SUTURE) IMPLANT
SUT VIC AB 0 CT1 27 (SUTURE) ×3
SUT VIC AB 0 CT1 27XBRD ANBCTR (SUTURE) ×1 IMPLANT
TOWEL OR 17X24 6PK STRL BLUE (TOWEL DISPOSABLE) ×3 IMPLANT
TOWEL OR 17X26 10 PK STRL BLUE (TOWEL DISPOSABLE) ×3 IMPLANT
WATER STERILE IRR 1000ML POUR (IV SOLUTION) ×1 IMPLANT

## 2017-03-27 NOTE — H&P (Addendum)
History and Physical    KAMILYA WAKEMAN WUJ:[id number] DOB: December 07, 1934 DOA: 03/26/2017  Referring MD/NP/PA: Claiborne Billings PCP: Dorothyann Peng, NP  Patient coming from: home via EMS  Chief Complaint: Fall  HPI: Julia [name] is a 82 y.o. female with medical history significant of HTN, and hypothyroidism; who presents with complaints of right hip pain after fall at home while trying to bring groceries into the house.  At baseline she lives with her husband and ambulates without need of assistance despite having some balance issues. She does not know exactly what happened, but describes it as slowly going down to the ground with her right leg going underneath her. She notes that she did not lose consciousness or hit her head.  Denies having any chest pain, shortness of breath, nausea, vomiting, dysuria, or urinary frequency symptoms.  Patient with previous fall on 11/07/2016 resulting in right hip femoral neck fracture requiring surgical correction performed by Dr. Edmonia Lynch of orthopedics.  ED Course: On admission into the emergency department patient was noted to be afebrile, pulse 50-65, respiration 18-20, blood pressures 143/62-180 1/64, O2 saturations maintained on RA.  Labs revealed WBC 10.8, BUN 21, creatinine 1.01.  Imaging studies revealed a closed displaced intertrochanteric fracture.  Chest x-ray was negative for any acute abnormalities.  Dr. Doreatha Martin was on call for orthopedics and recommended transfer to [facility] for possible need of surgery.  Review of Systems  Constitutional: Negative for malaise/fatigue and weight loss.  HENT: Negative for ear discharge and ear pain.   Eyes: Negative for pain and discharge.  Cardiovascular: Negative for chest pain and PND.  Gastrointestinal: Negative for abdominal pain and diarrhea.  Genitourinary: Negative for dysuria and urgency.  Musculoskeletal: Positive for falls.  Skin: Negative for itching and rash.  Psychiatric/Behavioral: Positive for  memory loss (short term).    Past Medical History:  Diagnosis Date  . Alzheimer's dementia   . Anxiety   . Concussion with no loss of consciousness 05/14/2008   Qualifier: Diagnosis of  By: Arnoldo Morale MD, Balinda Quails   . Dementia   . GERD (gastroesophageal reflux disease)   . History of colonic polyps   . Hx of echocardiogram    Echo 4/16:  EF 55-60%, mild LVH  . Hyperlipidemia   . Hypertension   . Hypothyroidism   . Osteopenia   . Right forearm fracture 2011  . Thyroid disease    hypothyroidism    Past Surgical History:  Procedure Laterality Date  . ABDOMINAL HYSTERECTOMY  2009  . BILATERAL SALPINGOOPHORECTOMY    . bladder tack    . CATARACT EXTRACTION Right at least a year ago  . HIP PINNING,CANNULATED Right 11/07/2016   Procedure: RIGHT CANNULATED HIP PINNING;  Surgeon: Renette Butters, MD;  Location: [place];  Service: Orthopedics;  Laterality: Right;  . INCISION AND DRAINAGE OF WOUND Left 08/04/2015   Procedure: DEBRIDEMENT DISTAL INTERPHALANGEAL LEFT INDEX FINGER;  Surgeon: Daryll Brod, MD;  Location: [place];  Service: Orthopedics;  Laterality: Left;  Marland Kitchen MASS EXCISION N/A 08/04/2015   Procedure: EXCISION MUCOID CYST;  Surgeon: Daryll Brod, MD;  Location: [place];  Service: Orthopedics;  Laterality: N/A;  . MASS EXCISION Left 11/24/2015   Procedure: EXCISION RECURRENT  MUCOID CYST LEFT INDEX  FINGER;  Surgeon: Daryll Brod, MD;  Location: [place];  Service: Orthopedics;  Laterality: Left;  . ROTATOR CUFF REPAIR     right shoulder  . surgical repair to fx left wrist    .  TONSILLECTOMY       reports that she has never smoked. She has never used smokeless tobacco. She reports that she does not drink alcohol or use drugs.  Allergies  Allergen Reactions  . Entex Other (See Comments)    unknown  . Sulfonamide Derivatives Other (See Comments)    unknown    Family History  Problem Relation Age of Onset  . Hypertension Mother   .  COPD Mother   . Tuberculosis Mother        x 2  . Lung cancer Father   . Stomach cancer Father   . Heart disease Brother   . Drug abuse Maternal Uncle   . Cancer Maternal Uncle   . Alzheimer's disease Maternal Aunt   . Colon cancer Neg Hx   . Esophageal cancer Neg Hx   . Rectal cancer Neg Hx   . Heart attack Neg Hx   . Stroke Neg Hx     Prior to Admission medications   Medication Sig Start Date End Date Taking? Authorizing Provider  amLODipine (NORVASC) 5 MG tablet Take 1 tablet (5 mg total) by mouth daily. 02/08/17  Yes Nafziger, Tommi Rumps, NP  aspirin EC 325 MG tablet Take 1 tablet (325 mg total) by mouth daily. For 30 days post op for DVT Prophylaxis.  Resume 81 mg Aspirin when complete. 11/07/16  Yes Prudencio Burly III, PA-C  docusate sodium (COLACE) 100 MG capsule Take 1 capsule (100 mg total) by mouth 2 (two) times daily. To prevent constipation while taking pain medication. Patient taking differently: Take 100 mg by mouth 2 (two) times daily as needed for mild constipation or moderate constipation. To prevent constipation while taking pain medication. 11/07/16  Yes Martensen, Charna Denzil III, PA-C  galantamine (RAZADYNE ER) 24 MG 24 hr capsule TAKE 1 CAPSULE BY MOUTH DAILY WITH BREAKFAST 02/08/17  Yes Nafziger, Tommi Rumps, NP  Ibuprofen 200 MG CAPS Take 1 capsule (200 mg total) by mouth daily as needed. Patient taking differently: Take 200 mg by mouth daily.  09/06/14  Yes Weaver, Scott T, PA-C  latanoprost (XALATAN) 0.005 % ophthalmic solution Place 1 drop into both eyes at bedtime.   Yes [provider]  levothyroxine (SYNTHROID, LEVOTHROID) 88 MCG tablet TAKE 1 TABLET BY MOUTH DAILY BEFORE BREAKFAST Patient taking differently: Take 88 mcg by mouth daily before breakfast.  03/02/17  Yes Nafziger, Tommi Rumps, NP  memantine (NAMENDA XR) 28 MG CP24 24 hr capsule Take 1 capsule (28 mg total) by mouth daily. 02/08/17  Yes Nafziger, Tommi Rumps, NP  mirabegron ER (MYRBETRIQ) 25 MG TB24 tablet Take  25 mg by mouth daily.   Yes [provider]  mirtazapine (REMERON) 15 MG tablet Take 1 tablet (15 mg total) by mouth at bedtime. 03/02/17  Yes Nafziger, Tommi Rumps, NP  Multiple Vitamin (MULTIVITAMIN) tablet Take 1 tablet by mouth daily.   Yes [provider]  Omega-3 Fatty Acids (FISH OIL) 1000 MG CAPS Take 1,000 mg by mouth daily.   Yes [provider]  omeprazole (PRILOSEC) 20 MG capsule Take 1 capsule (20 mg total) by mouth daily. While taking anti inflammatory medicine daily 11/07/16  Yes Martensen, Charna Shardee III, PA-C  polyethylene glycol (MIRALAX / GLYCOLAX) packet Take 17 g by mouth daily as needed for mild constipation. Reported on 10/17/2015   Yes [provider]  QUEtiapine (SEROQUEL) 25 MG tablet Take 1 tablet (25 mg total) by mouth at bedtime. 02/22/17  Yes Nafziger, Tommi Rumps, NP  rosuvastatin (CRESTOR) 20 MG  tablet Take 1 tablet (20 mg total) by mouth once a week. 09/12/14  Yes Marin Olp, MD  HYDROcodone-acetaminophen (NORCO) 5-325 MG tablet Take 1-2 tablets by mouth every 6 (six) hours as needed for moderate pain. Patient not taking: Reported on 03/27/2017 11/07/16   Prudencio Burly III, PA-C  triamcinolone cream (KENALOG) 0.1 % Apply 1 application topically 2 (two) times daily. Patient not taking: Reported on 03/27/2017 10/17/15   Robyn Haber, MD    Physical Exam:  Constitutional: Female in no acute distress while still Vitals:   03/26/17 2230 03/26/17 2245 03/26/17 2300 03/26/17 2330  BP: (!) 163/61  (!) 154/62 (!) 143/62  Pulse: (!) 52 (!) 52 (!) 57 (!) 59  Resp:   20 18  Temp:      TempSrc:      SpO2: 98% 96% 97% 96%   Eyes: PERRL, lids and conjunctivae normal ENMT: Mucous membranes are moist. Posterior pharynx clear of any exudate or lesions. Neck: normal, supple, no masses, no thyromegaly Respiratory: clear to auscultation bilaterally, no wheezing, no crackles. Normal respiratory effort. No accessory muscle use.    Cardiovascular: Regular rate and rhythm, no murmurs / rubs / gallops. No extremity edema. 2+ pedal pulses. No carotid bruits.  Abdomen: no tenderness, no masses palpated. No hepatosplenomegaly. Bowel sounds positive.  Musculoskeletal: no clubbing / cyanosis.  Right leg externally rotated and shortened Neurologic: CN 2-12 grossly intact. Sensation intact, DTR normal. Strength 5/5 in all 4.  Psychiatric: Normal judgment and insight. Alert and oriented x 3. Normal mood.     Labs on Admission: I have personally reviewed following labs and imaging studies  CBC:  Recent Labs Lab 03/26/17 2325  WBC 10.8*  NEUTROABS 9.2*  HGB 13.8  HCT 41.4  MCV 88.7  PLT 242   Basic Metabolic Panel:  Recent Labs Lab 03/26/17 2325  NA 139  K 4.0  CL 103  CO2 26  GLUCOSE 129*  BUN 21*  CREATININE 1.01*  CALCIUM 9.0   GFR: CrCl cannot be calculated (Unknown ideal weight.). Liver Function Tests: No results for input(s): AST, ALT, ALKPHOS, BILITOT, PROT, ALBUMIN in the last 168 hours. No results for input(s): LIPASE, AMYLASE in the last 168 hours. No results for input(s): AMMONIA in the last 168 hours. Coagulation Profile: No results for input(s): INR, PROTIME in the last 168 hours. Cardiac Enzymes: No results for input(s): CKTOTAL, CKMB, CKMBINDEX, TROPONINI in the last 168 hours. BNP (last 3 results) No results for input(s): PROBNP in the last 8760 hours. HbA1C: No results for input(s): HGBA1C in the last 72 hours. CBG: No results for input(s): GLUCAP in the last 168 hours. Lipid Profile: No results for input(s): CHOL, HDL, LDLCALC, TRIG, CHOLHDL, LDLDIRECT in the last 72 hours. Thyroid Function Tests: No results for input(s): TSH, T4TOTAL, FREET4, T3FREE, THYROIDAB in the last 72 hours. Anemia Panel: No results for input(s): VITAMINB12, FOLATE, FERRITIN, TIBC, IRON, RETICCTPCT in the last 72 hours. Urine analysis:    Component Value Date/Time   COLORURINE YELLOW 10/28/2016 1620    APPEARANCEUR HAZY (A) 10/28/2016 1620   LABSPEC 1.016 10/28/2016 1620   PHURINE 6.0 10/28/2016 1620   GLUCOSEU NEGATIVE 10/28/2016 1620   HGBUR NEGATIVE 10/28/2016 1620   HGBUR negative 03/02/2010 1020   BILIRUBINUR NEGATIVE 10/28/2016 1620   BILIRUBINUR n 07/09/2015 1041   KETONESUR NEGATIVE 10/28/2016 1620   PROTEINUR NEGATIVE 10/28/2016 1620   UROBILINOGEN 1.0 07/09/2015 1041   UROBILINOGEN 1.0 09/05/2014 0400   NITRITE NEGATIVE 10/28/2016  [place] (A) 10/28/2016 1620   Sepsis Labs: No results found for this or any previous visit (from the past 240 hour(s)).   Radiological Exams on Admission: Dg Chest 1 View  Result Date: 03/26/2017 CLINICAL DATA:  82 year old female with fall and probable right hip fracture. EXAM: CHEST 1 VIEW COMPARISON:  Chest radiograph dated 11/07/2016 FINDINGS: There is mild diffuse interstitial coarsening, chronic. No focal consolidation, pleural effusion, or pneumothorax. The cardiac silhouette is within normal limits. There is osteopenia with degenerative changes of the spine. No acute osseous pathology. IMPRESSION: No active disease. Electronically Signed   By: Anner Crete M.D.   On: 03/26/2017 22:41   Dg Hip Unilat  With Pelvis 2-3 Views Right  Result Date: 03/26/2017 CLINICAL DATA:  Fall tonight with right hip pain. EXAM: DG HIP (WITH OR WITHOUT PELVIS) 2-3V RIGHT COMPARISON:  Postoperative radiograph 11/07/2016 FINDINGS: Three partially cannulated screws traverse the intertrochanteric femur site fixating prior femoral neck fracture. Acute displaced fracture of the right proximal femur is likely intertrochanteric, fracture may extend through the proximal aspect of the screws. The femoral head remains seated. Pubic rami are intact. Pubic symphysis is congruent. IMPRESSION: Acute proximal femur fracture, likely intertrochanteric and displaced. Fracture may be through proximal femoral screws traversing prior femoral neck fracture. CT  would provide better definition of fracture planes. Electronically Signed   By: Jeb Levering M.D.   On: 03/26/2017 22:39    X-rays of the right hip: Independently reviewed.  Appreciated communicated appearing fracture of the right proximal femur.  Assessment/Plan Right intertrochanteric hip Fracture 2/2 fall at home: Patient with previous fracture of the femoral neck. RCRI score of 0 (no active cardiac condition, CHF, CAD, DM treated with insulin, TIA/CVA, Cr > 2.0), and a Gupta score 0.8%.  - Admit to telemetry [facility] at [facility] - Hip fracture order set initiated - IV morphine prn pain - IV fluids normal saline at 75 mL/h overnight  Leukocytosis: WBC elevated 10.8 on admission.  Likely reactive in nature. - Recheck WBC in a.m. - Check urinalysis  Essential hypertension - Continue amlodipine  Hypothyroidism - Check TSH - Continue levothyroxine  Dementia - Continue Namenda, Seroquel, Remeron, and galantamine  Hyperlipidemia - Continue Crestor  DVT prophylaxis:  Lovenox start later this evening Code Status:DNR Family Communication: discussed plan of care with the patient family present at bedside Disposition Plan: TBD  Consults called: Orthopedic surgery Admission status: Inpatient  Norval Morton MD [facility] Pager [phone]   If 7PM-7AM, please contact night-coverage [email or web address] Password [email or web address]  03/27/2017, 12:30 AM

## 2017-03-27 NOTE — Progress Notes (Signed)
Orthopedic Tech Progress Note Patient Details:  Julia [name] 19-May-1935 [id number]  Patient ID: [name], female   DOB: Jan 11, 1935, 82 y.o.   MRN: [id number]   [place] Pink 03/27/2017, 8:35 AMPatient unable to use Trapeze bar.

## 2017-03-27 NOTE — Anesthesia Procedure Notes (Signed)
Spinal  Patient location during procedure: OR Start time: 03/27/2017 3:14 PM End time: 03/27/2017 3:20 PM Staffing Anesthesiologist: Hideko Esselman Preanesthetic Checklist Completed: patient identified, site marked, surgical consent, pre-op evaluation, timeout performed, IV checked, risks and benefits discussed and monitors and equipment checked Spinal Block Patient position: left lateral decubitus Prep: DuraPrep Patient monitoring: heart rate, cardiac monitor, continuous pulse ox and blood pressure Approach: midline Location: L4-5 Injection technique: single-shot Needle Needle type: Sprotte  Needle gauge: 24 G Needle length: 9 cm Assessment Sensory level: T4

## 2017-03-27 NOTE — Transfer of Care (Signed)
Immediate Anesthesia Transfer of Care Note  Patient: Julia [name]  Procedure(s) Performed: INTRAMEDULLARY (IM) NAIL FEMORAL (Right Hip)  Patient Location: PACU  Anesthesia Type:Spinal  Level of Consciousness: awake, alert  and patient cooperative  Airway & Oxygen Therapy: Patient Spontanous Breathing and Patient connected to face mask oxygen  Post-op Assessment: Report given to RN and Post -op Vital signs reviewed and stable  Post vital signs: Reviewed and stable  Last Vitals:  Vitals:   03/27/17 0858 03/27/17 1706  BP: (!) 140/100   Pulse:    Resp:    Temp:  (!) (P) 36.2 C  SpO2:      Last Pain:  Vitals:   03/27/17 1706  TempSrc:   PainSc: (P) 0-No pain      Patients Stated Pain Goal: 3 (94/[id number]/46 2703)  Complications: No apparent anesthesia complications

## 2017-03-27 NOTE — H&P (Signed)
ORTHOPAEDIC CONSULTATION  REQUESTING PHYSICIAN: Nita Sells, MD  Chief Complaint: right hip fracture  HPI: Julia [name] is a 82 y.o. female who complains of a fall on the R side  Past Medical History:  Diagnosis Date  . Alzheimer's dementia   . Anxiety   . Concussion with no loss of consciousness 05/14/2008   Qualifier: Diagnosis of  By: Arnoldo Morale MD, Balinda Quails   . Dementia   . GERD (gastroesophageal reflux disease)   . History of colonic polyps   . Hx of echocardiogram    Echo 4/16:  EF 55-60%, mild LVH  . Hyperlipidemia   . Hypertension   . Hypothyroidism   . Osteopenia   . Right forearm fracture 2011  . Thyroid disease    hypothyroidism   Past Surgical History:  Procedure Laterality Date  . ABDOMINAL HYSTERECTOMY  2009  . BILATERAL SALPINGOOPHORECTOMY    . bladder tack    . CATARACT EXTRACTION Right at least a year ago  . HIP PINNING,CANNULATED Right 11/07/2016   Procedure: RIGHT CANNULATED HIP PINNING;  Surgeon: Renette Butters, MD;  Location: [place];  Service: Orthopedics;  Laterality: Right;  . INCISION AND DRAINAGE OF WOUND Left 08/04/2015   Procedure: DEBRIDEMENT DISTAL INTERPHALANGEAL LEFT INDEX FINGER;  Surgeon: Daryll Brod, MD;  Location: [place];  Service: Orthopedics;  Laterality: Left;  Marland Kitchen MASS EXCISION N/A 08/04/2015   Procedure: EXCISION MUCOID CYST;  Surgeon: Daryll Brod, MD;  Location: [place];  Service: Orthopedics;  Laterality: N/A;  . MASS EXCISION Left 11/24/2015   Procedure: EXCISION RECURRENT  MUCOID CYST LEFT INDEX  FINGER;  Surgeon: Daryll Brod, MD;  Location: [place];  Service: Orthopedics;  Laterality: Left;  . ROTATOR CUFF REPAIR     right shoulder  . surgical repair to fx left wrist    . TONSILLECTOMY     Social History   Social History  . Marital status: Married    Spouse name: N/A  . Number of children: N/A  . Years of education: N/A   Occupational History  . retired     Social History Main Topics  . Smoking status: Never Smoker  . Smokeless tobacco: Never Used  . Alcohol use No  . Drug use: No  . Sexual activity: No   Other Topics Concern  . None   Social History Narrative   Married to husband Renner Corner for 35 years and lives in [place]. 2 daughters live in [place], 1 son in [place] and 1 in [place].       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       Hobbies: tv, caring for cat (very important to her), change is difficult even with type of tv      Jake Samples (daughter) Chauncey Reading [phone]   Full Code   Family History  Problem Relation Age of Onset  . Hypertension Mother   . COPD Mother   . Tuberculosis Mother        x 2  . Lung cancer Father   . Stomach cancer Father   . Heart disease Brother   . Drug abuse Maternal Uncle   . Cancer Maternal Uncle   . Alzheimer's disease Maternal Aunt   . Colon cancer Neg Hx   . Esophageal cancer Neg Hx   . Rectal cancer Neg Hx   . Heart attack Neg Hx   . Stroke Neg Hx    Allergies  Allergen Reactions  . Entex Other (See Comments)    unknown  . Sulfonamide Derivatives Other (See Comments)    unknown   Prior to Admission medications   Medication Sig Start Date End Date Taking? Authorizing Provider  amLODipine (NORVASC) 5 MG tablet Take 1 tablet (5 mg total) by mouth daily. 02/08/17  Yes Nafziger, Tommi Rumps, NP  aspirin EC 325 MG tablet Take 1 tablet (325 mg total) by mouth daily. For 30 days post op for DVT Prophylaxis.  Resume 81 mg Aspirin when complete. 11/07/16  Yes Prudencio Burly III, PA-C  docusate sodium (COLACE) 100 MG capsule Take 1 capsule (100 mg total) by mouth 2 (two) times daily. To prevent constipation while taking pain medication. Patient taking differently: Take 100 mg by mouth 2 (two) times daily as needed for mild constipation or moderate constipation. To prevent constipation while taking pain medication. 11/07/16  Yes Martensen, Charna Angelea III, PA-C    galantamine (RAZADYNE ER) 24 MG 24 hr capsule TAKE 1 CAPSULE BY MOUTH DAILY WITH BREAKFAST 02/08/17  Yes Nafziger, Tommi Rumps, NP  Ibuprofen 200 MG CAPS Take 1 capsule (200 mg total) by mouth daily as needed. Patient taking differently: Take 200 mg by mouth daily.  09/06/14  Yes Weaver, Scott T, PA-C  latanoprost (XALATAN) 0.005 % ophthalmic solution Place 1 drop into both eyes at bedtime.   Yes [provider]  levothyroxine (SYNTHROID, LEVOTHROID) 88 MCG tablet TAKE 1 TABLET BY MOUTH DAILY BEFORE BREAKFAST Patient taking differently: Take 88 mcg by mouth daily before breakfast.  03/02/17  Yes Nafziger, Tommi Rumps, NP  memantine (NAMENDA XR) 28 MG CP24 24 hr capsule Take 1 capsule (28 mg total) by mouth daily. 02/08/17  Yes Nafziger, Tommi Rumps, NP  mirabegron ER (MYRBETRIQ) 25 MG TB24 tablet Take 25 mg by mouth daily.   Yes [provider]  mirtazapine (REMERON) 15 MG tablet Take 1 tablet (15 mg total) by mouth at bedtime. 03/02/17  Yes Nafziger, Tommi Rumps, NP  Multiple Vitamin (MULTIVITAMIN) tablet Take 1 tablet by mouth daily.   Yes [provider]  Omega-3 Fatty Acids (FISH OIL) 1000 MG CAPS Take 1,000 mg by mouth daily.   Yes [provider]  omeprazole (PRILOSEC) 20 MG capsule Take 1 capsule (20 mg total) by mouth daily. While taking anti inflammatory medicine daily 11/07/16  Yes Martensen, Charna Terika III, PA-C  polyethylene glycol (MIRALAX / GLYCOLAX) packet Take 17 g by mouth daily as needed for mild constipation. Reported on 10/17/2015   Yes [provider]  QUEtiapine (SEROQUEL) 25 MG tablet Take 1 tablet (25 mg total) by mouth at bedtime. 02/22/17  Yes Nafziger, Tommi Rumps, NP  rosuvastatin (CRESTOR) 20 MG tablet Take 1 tablet (20 mg total) by mouth once a week. 09/12/14  Yes Marin Olp, MD  HYDROcodone-acetaminophen (NORCO) 5-325 MG tablet Take 1-2 tablets by mouth every 6 (six) hours as needed for moderate pain. Patient not taking: Reported on 03/27/2017 11/07/16    Prudencio Burly III, PA-C  triamcinolone cream (KENALOG) 0.1 % Apply 1 application topically 2 (two) times daily. Patient not taking: Reported on 03/27/2017 10/17/15   Robyn Haber, MD   Dg Chest 1 View  Result Date: 03/26/2017 CLINICAL DATA:  82 year old female with fall and probable right hip fracture. EXAM: CHEST 1 VIEW COMPARISON:  Chest radiograph dated 11/07/2016 FINDINGS: There is mild diffuse interstitial coarsening, chronic. No focal consolidation, pleural effusion, or pneumothorax. The cardiac silhouette is within normal limits. There is osteopenia with degenerative changes  of the spine. No acute osseous pathology. IMPRESSION: No active disease. Electronically Signed   By: Anner Crete M.D.   On: 03/26/2017 22:41   Ct Hip Right Wo Contrast  Result Date: 03/27/2017 CLINICAL DATA:  82 year old female with right hip fracture after fall. EXAM: CT OF THE RIGHT HIP WITHOUT CONTRAST TECHNIQUE: Multidetector CT imaging of the right hip was performed according to the standard protocol. Multiplanar CT image reconstructions were also generated. COMPARISON:  Radiograph dated 03/26/2017 and 11/07/2016. FINDINGS: Bones/Joint/Cartilage There is an acute comminuted and displaced oblique fracture of the proximal right femur. The main fracture is an oblique intertrochanteric fracture which extends anterior to the femoral neck fixation screws. The fracture appears to abut the anterior most screw. Evaluation of the fracture is however limited due to streak artifact caused by metallic screws as well as osteopenia. There is approximately 8 mm distraction gap. There is mild proximal migration of the femoral shaft in relation to the neck of the femur. There is no dislocation. Ligaments Suboptimally assessed by CT. Muscles and Tendons No large hematoma. Mild edema in the musculature of the right thigh. Soft tissues Large amount of stool noted within the visualized colon. There are small scattered colonic  diverticula. IMPRESSION: Comminuted appearing mildly displaced predominantly intertrochanteric fracture of the proximal right femur as described. The main fracture is anterior to the fixation screws of the femur. Electronically Signed   By: Anner Crete M.D.   On: 03/27/2017 00:51   Dg Hip Unilat  With Pelvis 2-3 Views Right  Result Date: 03/26/2017 CLINICAL DATA:  Fall tonight with right hip pain. EXAM: DG HIP (WITH OR WITHOUT PELVIS) 2-3V RIGHT COMPARISON:  Postoperative radiograph 11/07/2016 FINDINGS: Three partially cannulated screws traverse the intertrochanteric femur site fixating prior femoral neck fracture. Acute displaced fracture of the right proximal femur is likely intertrochanteric, fracture may extend through the proximal aspect of the screws. The femoral head remains seated. Pubic rami are intact. Pubic symphysis is congruent. IMPRESSION: Acute proximal femur fracture, likely intertrochanteric and displaced. Fracture may be through proximal femoral screws traversing prior femoral neck fracture. CT would provide better definition of fracture planes. Electronically Signed   By: Jeb Levering M.D.   On: 03/26/2017 22:39    Positive ROS: All other systems have been reviewed and were otherwise negative with the exception of those mentioned in the HPI and as above.  Labs cbc  Recent Labs  03/26/17 2325  WBC 10.8*  HGB 13.8  HCT 41.4  PLT 212    Labs inflam No results for input(s): CRP in the last 72 hours.  Invalid input(s): ESR  Labs coag No results for input(s): INR, PTT in the last 72 hours.  Invalid input(s): PT   Recent Labs  03/26/17 2325  NA 139  K 4.0  CL 103  CO2 26  GLUCOSE 129*  BUN 21*  CREATININE 1.01*  CALCIUM 9.0    Physical Exam: Vitals:   03/27/17 0406 03/27/17 0858  BP: (!) 152/64 (!) 140/100  Pulse: 71   Resp:    Temp: 98.4 F (36.9 C)   SpO2: 100%    General: Alert, no acute distress Cardiovascular: No pedal  edema Respiratory: No cyanosis, no use of accessory musculature GI: No organomegaly, abdomen is soft and non-tender Skin: No lesions in the area of chief complaint other than those listed below in MSK exam.  Neurologic: Sensation intact distally save for the below mentioned MSK exam Psychiatric: Patient is competent for consent with normal  mood and affect Lymphatic: No axillary or cervical lymphadenopathy  MUSCULOSKELETAL:  RLE: NVI distally, compatments soft, pain with ROM Other extremities are atraumatic with painless ROM and NVI.  Assessment: R hip intertroch fx  Plan: Removal of HW and IM nail Today   Renette Butters, MD Cell [phone]   03/27/2017 2:59 PM

## 2017-03-27 NOTE — Op Note (Signed)
DATE OF SURGERY:  03/27/2017  TIME: 4:40 PM  PATIENT NAME:  Julia [name]  AGE: 82 y.o.  PRE-OPERATIVE DIAGNOSIS:  LEFT FEMORAL NAIL FRACTURE  POST-OPERATIVE DIAGNOSIS:  SAME  PROCEDURE:  INTRAMEDULLARY (IM) NAIL FEMORAL  SURGEON:  Zarahi Fuerst D  ASSISTANT:  none  OPERATIVE IMPLANTS: Stryker Gamma Nail  PREOPERATIVE INDICATIONS:  [name] is a 82 y.o. year old who fell and suffered a hip fracture. She was brought into the ER and then admitted and optimized and then elected for surgical intervention.    The risks benefits and alternatives were discussed with the patient including but not limited to the risks of nonoperative treatment, versus surgical intervention including infection, bleeding, nerve injury, malunion, nonunion, hardware prominence, hardware failure, need for hardware removal, blood clots, cardiopulmonary complications, morbidity, mortality, among others, and they were willing to proceed.    OPERATIVE PROCEDURE:  The patient was brought to the operating room and placed in the supine position. General anesthesia was administered. She was placed on the fracture table.  Closed reduction was performed under C-arm guidance. Time out was then performed after sterile prep and drape. She received preoperative antibiotics.  Incision was made proximal to the greater trochanter. A guidewire was placed in the appropriate position. Confirmation was made on AP and lateral views. The above-named nail was opened. I opened the proximal femur with a reamer. I then placed the nail by hand easily down. I did not need to ream the femur.  Once the nail was completely seated, I placed a guidepin into the femoral head into the center center position. I measured the length, and then reamed the lateral cortex and up into the head. I then placed the lag screw. Slight compression was applied. Anatomic fixation achieved. Bone quality was mediocre.  I then secured the proximal  interlocking bolt, and took off a half a turn, and then removed the instruments, and took final C-arm pictures AP and lateral the entire length of the leg.  I then used perfect circles technique to place a distal interlock screw.   Anatomic reconstruction was achieved, and the wounds were irrigated copiously and closed with Vicryl followed by staples and sterile gauze for the skin. The patient was awakened and returned to PACU in stable and satisfactory condition. There no complications and the patient tolerated the procedure well.  She will be weightbearing as tolerated, and will be on chemical px  for a period of four weeks after discharge.   Edmonia Lynch, M.D.

## 2017-03-27 NOTE — ED Notes (Signed)
Patient transported to CT 

## 2017-03-27 NOTE — Progress Notes (Signed)
Agree with history assessment plan and exam as per my partner Dr. Tamala Julian  82 year old female Known history of hypertension hypothyroid Prior femoral neck fracture right-sided status post right cannulated hip pinning 11/07/16 Mild Alzheimer's dementia Hyperlipidemia  presented to to [facility] 10/28 with fall had comminuted fracture of right proximal femur Orthopedics Dr. Percell Miller consulted and will probably need hip repair this admission   Awake alert in nad no pain as not moving leg No sob cta b N.p.o., on IV fluids 75 cc/h   Defer to orthopedics planning for surgery--await input   Verneita Griffes, MD [facility] [phone]

## 2017-03-27 NOTE — Progress Notes (Signed)
Orthopedic Tech Progress Note Patient Details:  [name] 09/21/1934 [id number]  Patient ID: Julia [name], female   DOB: 1934-07-11, 82 y.o.   MRN: [id number] Pt cant have ohf due to age restrictions  [name] 03/27/2017, 9:47 PM

## 2017-03-27 NOTE — Anesthesia Preprocedure Evaluation (Addendum)
Anesthesia Evaluation  Patient identified by MRN, date of birth, ID band Patient awake    Reviewed: Allergy & Precautions, H&P , NPO status , Patient's Chart, lab work & pertinent test results  Airway Mallampati: II  TM Distance: >3 FB Neck ROM: Full    Dental no notable dental hx. (+) Teeth Intact, Dental Advisory Given   Pulmonary neg pulmonary ROS,    Pulmonary exam normal breath sounds clear to auscultation       Cardiovascular hypertension, Pt. on medications  Rhythm:Regular Rate:Normal     Neuro/Psych Anxiety Dementia negative neurological ROS  negative psych ROS   GI/Hepatic negative GI ROS, Neg liver ROS, GERD  Medicated,  Endo/Other  Hypothyroidism   Renal/GU negative Renal ROS  negative genitourinary   Musculoskeletal   Abdominal   Peds  Hematology negative hematology ROS (+)   Anesthesia Other Findings   Reproductive/Obstetrics negative OB ROS                             Anesthesia Physical  Anesthesia Plan  ASA: III  Anesthesia Plan: Spinal   Post-op Pain Management:    Induction: Intravenous  PONV Risk Score and Plan: 3 and Ondansetron, Dexamethasone, Propofol and Treatment may vary due to age or medical condition  Airway Management Planned: Simple Face Mask, Nasal Cannula and Natural Airway  Additional Equipment:   Intra-op Plan:   Post-operative Plan:   Informed Consent: I have reviewed the patients History and Physical, chart, labs and discussed the procedure including the risks, benefits and alternatives for the proposed anesthesia with the patient or authorized representative who has indicated his/her understanding and acceptance.   Dental advisory given  Plan Discussed with: CRNA  Anesthesia Plan Comments:         Anesthesia Quick Evaluation

## 2017-03-27 NOTE — Anesthesia Postprocedure Evaluation (Signed)
Anesthesia Post Note  Patient: Julia [name]  Procedure(s) Performed: INTRAMEDULLARY (IM) NAIL FEMORAL (Right Hip)     Patient location during evaluation: PACU Anesthesia Type: Spinal Level of consciousness: oriented and awake and alert Pain management: pain level controlled Vital Signs Assessment: post-procedure vital signs reviewed and stable Respiratory status: spontaneous breathing, respiratory function stable and patient connected to nasal cannula oxygen Cardiovascular status: blood pressure returned to baseline and stable Postop Assessment: no headache, no backache and no apparent nausea or vomiting Anesthetic complications: no    Last Vitals:  Vitals:   03/27/17 0858 03/27/17 1706  BP: (!) 140/100   Pulse:    Resp:    Temp:  (!) 36.2 C  SpO2:      Last Pain:  Vitals:   03/27/17 1706  TempSrc:   PainSc: 0-No pain                 Ladarrell Cornwall

## 2017-03-27 NOTE — Anesthesia Procedure Notes (Addendum)
Procedure Name: MAC Date/Time: 03/27/2017 3:06 PM Performed by: Garrison Columbus T Pre-anesthesia Checklist: Patient identified, Emergency Drugs available, Suction available and Patient being monitored Patient Re-evaluated:Patient Re-evaluated prior to induction Oxygen Delivery Method: Simple face mask Preoxygenation: Pre-oxygenation with 100% oxygen Induction Type: IV induction Placement Confirmation: positive ETCO2 and breath sounds checked- equal and bilateral Dental Injury: Teeth and Oropharynx as per pre-operative assessment

## 2017-03-27 NOTE — ED Notes (Signed)
Patient is being picked up by [facility] and taken to [facility].

## 2017-03-28 ENCOUNTER — Encounter (HOSPITAL_COMMUNITY): Payer: Self-pay | Admitting: Orthopedic Surgery

## 2017-03-28 LAB — CBC WITH DIFFERENTIAL/PLATELET
BASOS PCT: 0 %
Basophils Absolute: 0 10*3/uL (ref 0.0–0.1)
Eosinophils Absolute: 0 10*3/uL (ref 0.0–0.7)
Eosinophils Relative: 0 %
HEMATOCRIT: 31.3 % — AB (ref 36.0–46.0)
HEMOGLOBIN: 10.2 g/dL — AB (ref 12.0–15.0)
LYMPHS ABS: 1.1 10*3/uL (ref 0.7–4.0)
Lymphocytes Relative: 15 %
MCH: 28.8 pg (ref 26.0–34.0)
MCHC: 32.6 g/dL (ref 30.0–36.0)
MCV: 88.4 fL (ref 78.0–100.0)
MONO ABS: 0.8 10*3/uL (ref 0.1–1.0)
MONOS PCT: 11 %
NEUTROS ABS: 5.5 10*3/uL (ref 1.7–7.7)
Neutrophils Relative %: 74 %
Platelets: 158 10*3/uL (ref 150–400)
RBC: 3.54 MIL/uL — ABNORMAL LOW (ref 3.87–5.11)
RDW: 14.2 % (ref 11.5–15.5)
WBC: 7.4 10*3/uL (ref 4.0–10.5)

## 2017-03-28 LAB — COMPREHENSIVE METABOLIC PANEL
ALBUMIN: 2.9 g/dL — AB (ref 3.5–5.0)
ALK PHOS: 44 U/L (ref 38–126)
ALT: 16 U/L (ref 14–54)
ANION GAP: 11 (ref 5–15)
AST: 29 U/L (ref 15–41)
BILIRUBIN TOTAL: 0.8 mg/dL (ref 0.3–1.2)
BUN: 18 mg/dL (ref 6–20)
CALCIUM: 8.6 mg/dL — AB (ref 8.9–10.3)
CO2: 22 mmol/L (ref 22–32)
Chloride: 104 mmol/L (ref 101–111)
Creatinine, Ser: 1.22 mg/dL — ABNORMAL HIGH (ref 0.44–1.00)
GFR calc non Af Amer: 41 mL/min — ABNORMAL LOW (ref >60)
GFR, EST AFRICAN AMERICAN: 47 mL/min — AB (ref >60)
Glucose, Bld: 151 mg/dL — ABNORMAL HIGH (ref 65–99)
POTASSIUM: 4.5 mmol/L (ref 3.5–5.1)
Sodium: 137 mmol/L (ref 135–145)
TOTAL PROTEIN: 5 g/dL — AB (ref 6.5–8.1)

## 2017-03-28 MED ORDER — ASPIRIN EC 325 MG PO TBEC: 325.0000 mg | DELAYED_RELEASE_TABLET | Freq: Every day | ORAL | 0 refills | Status: DC

## 2017-03-28 MED ORDER — DOCUSATE SODIUM 100 MG PO CAPS: 100.0000 mg | ORAL_CAPSULE | Freq: Two times a day (BID) | ORAL | 0 refills | Status: DC

## 2017-03-28 MED ORDER — OMEPRAZOLE 20 MG PO CPDR: 20.0000 mg | DELAYED_RELEASE_CAPSULE | Freq: Every day | ORAL | 0 refills | Status: DC

## 2017-03-28 MED ORDER — MEMANTINE HCL ER 28 MG PO CP24
28.0000 mg | ORAL_CAPSULE | Freq: Every day | ORAL | Status: DC
  Filled 2017-03-28: qty 1

## 2017-03-28 MED ORDER — HYDROCODONE-ACETAMINOPHEN 5-325 MG PO TABS: 1.0000 | ORAL_TABLET | Freq: Four times a day (QID) | ORAL | 0 refills | Status: DC | PRN

## 2017-03-28 MED ORDER — ONDANSETRON HCL 4 MG PO TABS: 4.0000 mg | ORAL_TABLET | Freq: Three times a day (TID) | ORAL | 0 refills | Status: DC | PRN

## 2017-03-28 NOTE — Evaluation (Signed)
Occupational Therapy Evaluation Patient Details Name: RENIKA SHIFLET MRN: [id number] DOB: [birth year] Today's Date: 03/28/2017    History of Present Illness  DAVANNA HE is a 82 y.o. female with medical history significant of HTN, and hypothyroidism, dementia; who presents with complaints of hip pain after fall at home while trying to bring groceries into the house. She underwent R hip IM nail. Of note, pt fell and fractured R hip in 6/18.   Clinical Impression   This 82 y/o F presents with the above. Pt lives with spouse, at baseline is independent with ADLs and functional mobility. Pt currently requires MinA and multimodal cues for stand pivot transfers at RW level, completing x2 during session; requires maxA for LB ADLs secondary to pain and R LE functional limitations. Pt will benefit from continued acute OT services and recommend Pt receive further OT services in SNF setting prior to return home to maximize Pt's safety and independence with ADLs and functional mobility.     Follow Up Recommendations  SNF;Supervision/Assistance - 24 hour    Equipment Recommendations  Other (comment) (defer to next venue )           Precautions / Restrictions Precautions Precautions: Fall Restrictions Weight Bearing Restrictions: Yes RLE Weight Bearing: Weight bearing as tolerated      Mobility Bed Mobility Overal bed mobility: Needs Assistance Bed Mobility: Supine to Sit     Supine to sit: Min assist     General bed mobility comments: Pt able to initiate scooting hips towards EOB, MinA to support RLE and assist using bed pad to scoot completely towards EOB  Transfers Overall transfer level: Needs assistance Equipment used: Rolling walker (2 wheeled) Transfers: Sit to/from [facility] Sit to Stand: Min assist Stand pivot transfers: Min assist       General transfer comment: verbal cues for hand placement; assist to rise and steady; multimodal cues for  sequencing RW use during transfer, Pt taking small pivotal steps during transfer (transferring to L)      Balance Overall balance assessment: Needs assistance Sitting-balance support: Feet supported Sitting balance-Leahy Scale: Fair     Standing balance support: Bilateral upper extremity supported Standing balance-Leahy Scale: Poor Standing balance comment: reliant on UE support on RW and external support from therapist                            ADL either performed or assessed with clinical judgement   ADL Overall ADL's : Needs assistance/impaired Eating/Feeding: Set up;Sitting   Grooming: Set up;Sitting   Upper Body Bathing: Min guard;Sitting   Lower Body Bathing: Moderate assistance;Sit to/from stand   Upper Body Dressing : Minimal assistance;Sitting   Lower Body Dressing: Maximal assistance;Sit to/from stand   Toilet Transfer: Minimal assistance;Stand-pivot;BSC;RW   Toileting- Water quality scientist and Hygiene: Sit to/from stand;Moderate assistance       Functional mobility during ADLs: Minimal assistance;Rolling walker General ADL Comments: Pt completed stand pivot transfer EOB>BSC>recliner with MinA, multimodal cues for sequencing use of RW during transfer                         Pertinent Vitals/Pain Pain Assessment: Faces Faces Pain Scale: Hurts little more Pain Descriptors / Indicators: Tender;Sore Pain Intervention(s): Monitored during session;Repositioned;Ice applied;Patient requesting pain meds-RN notified          Extremity/Trunk Assessment Upper Extremity Assessment Upper Extremity Assessment: Generalized weakness   Lower Extremity Assessment  Lower Extremity Assessment: Defer to PT evaluation       Communication Communication Communication: No difficulties   Cognition Arousal/Alertness: Awake/alert Behavior During Therapy: WFL for tasks assessed/performed Overall Cognitive Status: History of cognitive impairments - at  baseline                                 General Comments: mild dementia at baseline; requires min verbal safety cues and multimodal cues/one step directions during mobility    General Comments  Pt's daughter present during session                [place] expects to be discharged to:: Skilled nursing facility Living Arrangements: Spouse/significant other   Type of Home: House Home Access: Ramped entrance     [place]: One level     Bathroom Shower/Tub: Occupational psychologist: Standard                Prior Functioning/Environment Level of Independence: Independent                 OT Problem List: Decreased strength;Impaired balance (sitting and/or standing);Pain;Decreased cognition;Decreased safety awareness;Decreased activity tolerance;Decreased knowledge of use of DME or AE      OT Treatment/Interventions: Self-care/ADL training;DME and/or AE instruction;Balance training;Therapeutic activities;Therapeutic exercise;Patient/family education    OT Goals(Current goals can be found in the care plan section) Acute Rehab OT Goals Patient Stated Goal: return home  OT Goal Formulation: With patient Time For Goal Achievement: 04/11/17 Potential to Achieve Goals: Good  OT Frequency: Min 2X/week                             AM-PAC PT "6 Clicks" Daily Activity     Outcome Measure Help from another person eating meals?: None Help from another person taking care of personal grooming?: A Little Help from another person toileting, which includes using toliet, bedpan, or urinal?: A Lot Help from another person bathing (including washing, rinsing, drying)?: A Lot Help from another person to put on and taking off regular upper body clothing?: A Little Help from another person to put on and taking off regular lower body clothing?: A Lot 6 Click Score: 16   End of Session Equipment Utilized During Treatment: Gait  belt;Rolling walker Nurse Communication: Mobility status;Patient requests pain meds  Activity Tolerance: Patient tolerated treatment well Patient left: in chair;with call bell/phone within reach  OT Visit Diagnosis: Unsteadiness on feet (R26.81);Other abnormalities of gait and mobility (R26.89);History of falling (Z91.81);Pain Pain - Right/Left: Right Pain - part of body: Hip                Time: 0981-1914 OT Time Calculation (min): 34 min Charges:  OT General Charges $OT Visit: 1 Visit OT Evaluation $OT Eval Moderate Complexity: 1 Mod OT Treatments $Self Care/Home Management : 8-22 mins G-Codes:     Lou Cal, OT Pager [phone] 03/28/2017  Raymondo Band 03/28/2017, 11:03 AM

## 2017-03-28 NOTE — Progress Notes (Signed)
PROGRESS NOTE    Julia [name]  SVX:[id number] DOB: 1934-11-01 DOA: 03/26/2017 PCP: Dorothyann Peng, NP   Specialists:     Brief Narrative:  82 year old female Known history of hypertension hypothyroid Prior femoral neck fracture right-sided status post right cannulated hip pinning 11/07/16 Mild Alzheimer's dementia Hyperlipidemia  presented to to [facility] 10/28 with fall had comminuted fracture of right proximal femur Orthopedics Dr. Percell Miller consulted and will probably need hip repair this admission   Assessment & Plan:   Active Problems:   Essential hypertension   Hypothyroidism, acquired   Closed intertrochanteric fracture of hip, right, initial encounter ([place])   Mild dementia   Leukocytosis   Hip fracture s/p L femoral IM Nail  wbat with mepilex  ASa 325 x 30 post dc  Pain control tylenol 650 q6, Mod-severe norco 5/325 1-2 q 6 prn  Morphine IV 0.5 q 2 prn  Anemia of blood loss  Expected as post op  Hb 13-->10  tx threshold below 7  htn  Mod controlled on amlodipine 5 daily  hld  Cont crestor 0 daily  Dementia-only mild  Cont Namenda 28 qd, Cont Razadyne ER    scd and lovenox for now Inpatient  Await SNF--CSW aware likey ready for d/c aam 10/31   Consultants:   ortho  Procedures:    hip repair 10/29  Antimicrobials:    none    Subjective:  Awake alert in and eating drinking OOB sitting in chair no overt pain No fever no chills  Objective: Vitals:   03/27/17 2348 03/28/17 0413 03/28/17 0842 03/28/17 1253  BP: (!) 115/57 (!) 108/55 (!) 128/52 131/70  Pulse: 94 79 84 91  Resp: 16 16    Temp: 97.9 F (36.6 C) 97.6 F (36.4 C) 97.8 F (36.6 C) 97.6 F (36.4 C)  TempSrc: Oral Oral Oral Oral  SpO2: 95% 96% 95% 96%  Weight:      Height:        Intake/Output Summary (Last 24 hours) at 03/28/17 1331 Last data filed at 03/27/17 1708  Gross per 24 hour  Intake             1150 ml  Output             2000 ml  Net              -850 ml   Filed Weights   03/27/17 0331  Weight: 51.7 kg (114 lb)    Examination:  Alert pleasant nad orientable knows where she is abd soft nt nd no rebound no guard Neuro intact moving 4 limbs equally Wound not examined  Data Reviewed: I have personally reviewed following labs and imaging studies  CBC:  Recent Labs Lab 03/26/17 2325 03/28/17 0539  WBC 10.8* 7.4  NEUTROABS 9.2* 5.5  HGB 13.8 10.2*  HCT 41.4 31.3*  MCV 88.7 88.4  PLT 212 233   Basic Metabolic Panel:  Recent Labs Lab 03/26/17 2325 03/28/17 0539  NA 139 137  K 4.0 4.5  CL 103 104  CO2 26 22  GLUCOSE 129* 151*  BUN 21* 18  CREATININE 1.01* 1.22*  CALCIUM 9.0 8.6*   GFR: Estimated Creatinine Clearance: 30 mL/min (A) (by C-G formula based on SCr of 1.22 mg/dL (H)). Liver Function Tests:  Recent Labs Lab 03/28/17 0539  AST 29  ALT 16  ALKPHOS 44  BILITOT 0.8  PROT 5.0*  ALBUMIN 2.9*   No results for input(s): LIPASE, AMYLASE in the last  168 hours. No results for input(s): AMMONIA in the last 168 hours. Coagulation Profile: No results for input(s): INR, PROTIME in the last 168 hours. Cardiac Enzymes: No results for input(s): CKTOTAL, CKMB, CKMBINDEX, TROPONINI in the last 168 hours. BNP (last 3 results) No results for input(s): PROBNP in the last 8760 hours. HbA1C: No results for input(s): HGBA1C in the last 72 hours. CBG: No results for input(s): GLUCAP in the last 168 hours. Lipid Profile: No results for input(s): CHOL, HDL, LDLCALC, TRIG, CHOLHDL, LDLDIRECT in the last 72 hours. Thyroid Function Tests: No results for input(s): TSH, T4TOTAL, FREET4, T3FREE, THYROIDAB in the last 72 hours. Anemia Panel: No results for input(s): VITAMINB12, FOLATE, FERRITIN, TIBC, IRON, RETICCTPCT in the last 72 hours. Urine analysis:    Component Value Date/Time   COLORURINE YELLOW 10/28/2016 1620   APPEARANCEUR HAZY (A) 10/28/2016 1620   LABSPEC 1.016 10/28/2016 1620   PHURINE  6.0 10/28/2016 1620   GLUCOSEU NEGATIVE 10/28/2016 1620   HGBUR NEGATIVE 10/28/2016 1620   HGBUR negative 03/02/2010 1020   BILIRUBINUR NEGATIVE 10/28/2016 1620   BILIRUBINUR n 07/09/2015 1041   KETONESUR NEGATIVE 10/28/2016 1620   PROTEINUR NEGATIVE 10/28/2016 1620   UROBILINOGEN 1.0 07/09/2015 1041   UROBILINOGEN 1.0 09/05/2014 0400   NITRITE NEGATIVE 10/28/2016 1620   LEUKOCYTESUR TRACE (A) 10/28/2016 1620     Radiology Studies: Reviewed images personally in health database    Scheduled Meds: . amLODipine  5 mg Oral Daily  . enoxaparin (LOVENOX) injection  40 mg Subcutaneous Q24H  . galantamine  24 mg Oral Q breakfast  . latanoprost  1 drop Both Eyes QHS  . levothyroxine  88 mcg Oral QAC breakfast  . [START ON 03/29/2017] memantine  28 mg Oral QHS  . mirabegron ER  25 mg Oral Daily  . mirtazapine  15 mg Oral QHS  . pantoprazole  40 mg Oral Daily  . QUEtiapine  25 mg Oral QHS  . rosuvastatin  20 mg Oral q1800   Continuous Infusions: . lactated ringers 75 mL/hr at 03/27/17 2355     LOS: 1 day    Time spent: Trail, MD [facility]   If 7PM-7AM, please contact night-coverage [email or web address] Password [email or web address] 03/28/2017, 1:31 PM

## 2017-03-28 NOTE — Clinical Social Work Note (Signed)
Clinical Social Work Assessment  Patient Details  Name: Julia [name] MRN: [id number] Date of Birth: 1934-10-08  Date of referral:  03/28/17               Reason for consult:  Facility Placement                Permission sought to share information with:  Chartered certified accountant granted to share information::  Yes, Verbal Permission Granted  Name::     Julia [name]  Agency::  SNF  Relationship::  daughter  Contact Information:     Housing/Transportation Living arrangements for the past 2 months:  Single Family Home Source of Information:  Patient, Adult Children Patient Interpreter Needed:  None Criminal Activity/Legal Involvement Pertinent to Current Situation/Hospitalization:  No - Comment as needed Significant Relationships:  Adult Children, Other Family Members, Spouse Lives with:  Spouse Do you feel safe going back to the place where you live?  No Need for family participation in patient care:  Yes (Comment)  Care giving concerns:  Pt from home with spouse. Prior to hospitalization, patient ambulated independently and managed all ADL's. Daughter indicated that patient broke the same hip in the past. Pt cannot be cared for by spouse at home and will need SNF at discharge per daughter. Pt has experience with SNF and went to [facility] in the past. Family would be amenable to [place] or [place] at discharge.  Social Worker assessment / plan:  CSW discussed SNF options and placement. CSW will f/u with SNF's on availability once patient works with PT.  [place] will assist with transport once medically ready.  Employment status:  Retired Nurse, adult PT Recommendations:  [place] / Referral to community resources:  [place]  Patient/Family's Response to care:  Psychologist, prison and probation services of CSW assistance with SNF placement/options. No issues or concerns identified.  Patient/Family's  Understanding of and Emotional Response to Diagnosis, Current Treatment, and Prognosis:  Patient/family has good understanding of diagnosis, current treatment, and prognosis. Family hopeful that patient will be accepted in desired SNF as they had positive results with SNF in the past. No other issues or concerns identified.   Emotional Assessment Appearance:  Appears stated age Attitude/Demeanor/Rapport:   (Cooperative) Affect (typically observed):  Accepting, Appropriate Orientation:  Oriented to Self, Oriented to Place, Oriented to  Time, Oriented to Situation Alcohol / Substance use:  Not Applicable Psych involvement (Current and /or in the community):  No (Comment)  Discharge Needs  Concerns to be addressed:  Care Coordination Readmission within the last 30 days:  No Current discharge risk:  Physical Impairment, Dependent with Mobility Barriers to Discharge:  No Barriers Identified   Normajean Baxter, LCSW 03/28/2017, 1:58 PM

## 2017-03-28 NOTE — Evaluation (Signed)
Physical Therapy Evaluation Patient Details Name: Julia [name] MRN: [id number] DOB: [birth year] Today's Date: 03/28/2017   History of Present Illness   Julia [name] is a 82 y.o. female with medical history significant of HTN, and hypothyroidism, dementia; who presents with complaints of hip pain after fall at home while trying to bring groceries into the house. She underwent R hip IM nail. Of note, pt fell and fractured R hip in 6/18.  Clinical Impression  Pt admitted with above diagnosis. Pt currently with functional limitations due to the deficits listed below (see PT Problem List). Pt doing surprisingly well for having broken same hip within 4 months. Ambulated 72' with RW and min A, though when she fatigued, could not go any further and chair had to be brought to her to sit. Needs frequent vc's due to dementia, poor carryover.  Pt will benefit from skilled PT to increase their independence and safety with mobility to allow discharge to the venue listed below.       Follow Up Recommendations SNF;Supervision/Assistance - 24 hour    Equipment Recommendations  None recommended by PT    Recommendations for Other Services       Precautions / Restrictions Precautions Precautions: Fall Precaution Comments: pt just fell and broke same hip in 6/18 Restrictions Weight Bearing Restrictions: Yes RLE Weight Bearing: Weight bearing as tolerated      Mobility  Bed Mobility Overal bed mobility: Needs Assistance Bed Mobility: Supine to Sit     Supine to sit: Min assist     General bed mobility comments: pt received in chair  Transfers Overall transfer level: Needs assistance Equipment used: Rolling walker (2 wheeled) Transfers: Sit to/from Stand Sit to Stand: Min assist Stand pivot transfers: Min assist       General transfer comment: min A to steady and vc's for hand placement  Ambulation/Gait Ambulation/Gait assistance: Min assist Ambulation Distance (Feet): 80  Feet Assistive device: Rolling walker (2 wheeled) Gait Pattern/deviations: Step-through pattern;Decreased weight shift to right;Antalgic Gait velocity: decreased Gait velocity interpretation: Below normal speed for age/gender General Gait Details: pt did very well first 60', slowed down last 20' and at 93', could not step further, needed specific vc's to turn and sit in chair. Min A needed for safety  Stairs            Wheelchair Mobility    Modified Rankin (Stroke Patients Only)       Balance Overall balance assessment: Needs assistance Sitting-balance support: Feet supported Sitting balance-Leahy Scale: Fair     Standing balance support: Bilateral upper extremity supported Standing balance-Leahy Scale: Poor Standing balance comment: reliant on UE support                             Pertinent Vitals/Pain Pain Assessment: Faces Faces Pain Scale: Hurts even more Pain Location: R hip Pain Descriptors / Indicators: Tender;Sore Pain Intervention(s): Limited activity within patient's tolerance;Monitored during session    Home Living Family/patient expects to be discharged to:: Skilled nursing facility Living Arrangements: Spouse/significant other   Type of Home: House Home Access: Ramped entrance     Home Layout: One level        Prior Function Level of Independence: Independent               Hand Dominance        Extremity/Trunk Assessment   Upper Extremity Assessment Upper Extremity Assessment: Defer to OT evaluation  Lower Extremity Assessment Lower Extremity Assessment: Generalized weakness    Cervical / Trunk Assessment Cervical / Trunk Assessment: Kyphotic  Communication   Communication: No difficulties  Cognition Arousal/Alertness: Awake/alert Behavior During Therapy: WFL for tasks assessed/performed Overall Cognitive Status: History of cognitive impairments - at baseline                                  General Comments: dementia at baseline, does not remember details of hip fx in June, quite confused on eval today      General Comments General comments (skin integrity, edema, etc.): pt went to [facility] after last hip fx, would be an option again    Exercises General Exercises - Lower Extremity Ankle Circles/Pumps: AROM;Both;Seated;10 reps Quad Sets: AROM;Both;10 reps;Seated Long Arc Quad: AROM;Right;10 reps;Seated Heel Slides: AROM;Right;10 reps;Seated   Assessment/Plan    PT Assessment Patient needs continued PT services  PT Problem List Decreased strength;Decreased activity tolerance;Decreased balance;Decreased mobility;Decreased knowledge of use of DME;Decreased knowledge of precautions;Decreased safety awareness;Decreased cognition;Decreased coordination;Pain       PT Treatment Interventions DME instruction;Gait training;Functional mobility training;Therapeutic activities;Therapeutic exercise;Balance training;Patient/family education    PT Goals (Current goals can be found in the Care Plan section)  Acute Rehab PT Goals Patient Stated Goal: return home  PT Goal Formulation: With patient Time For Goal Achievement: 04/11/17 Potential to Achieve Goals: Good    Frequency Min 3X/week   Barriers to discharge Decreased caregiver support lives at home with husband who needs RW for all mobility, will need SNF    Co-evaluation               AM-PAC PT "6 Clicks" Daily Activity  Outcome Measure Difficulty turning over in bed (including adjusting bedclothes, sheets and blankets)?: Unable Difficulty moving from lying on back to sitting on the side of the bed? : Unable Difficulty sitting down on and standing up from a chair with arms (e.g., wheelchair, bedside commode, etc,.)?: Unable Help needed moving to and from a bed to chair (including a wheelchair)?: A Little Help needed walking in hospital room?: A Little Help needed climbing 3-5 steps with a railing? : A Lot 6  Click Score: 11    End of Session Equipment Utilized During Treatment: Gait belt Activity Tolerance: Patient tolerated treatment well Patient left: in chair;with call bell/phone within reach;with family/visitor present Nurse Communication: Mobility status PT Visit Diagnosis: Unsteadiness on feet (R26.81);Repeated falls (R29.6);Muscle weakness (generalized) (M62.81);Difficulty in walking, not elsewhere classified (R26.2);History of falling (Z91.81);Pain Pain - Right/Left: Right Pain - part of body: Hip    Time: 3825-0539 PT Time Calculation (min) (ACUTE ONLY): 27 min   Charges:   PT Evaluation $PT Eval Moderate Complexity: 1 Mod PT Treatments $Gait Training: 8-22 mins   PT G Codes:        Leighton Roach, PT  Acute Rehab Services  [place] 03/28/2017, 2:20 PM

## 2017-03-28 NOTE — Social Work (Signed)
CSW awaiting PT evaluation to continue with SNF search.  Pt/family interested in [facility] or [facility].  DNR on chart to be signed.  CSW will continue to follow.  Elissa Hefty, LCSW Clinical Social Worker [phone]

## 2017-03-28 NOTE — Progress Notes (Signed)
   Assessment / Plan: 1 Day Post-Op  S/P Procedure(s) (LRB): INTRAMEDULLARY (IM) NAIL FEMORAL (Right) by Dr. Ernesta Amble. Murphy on 03/27/2017  Active Problems:   Essential hypertension   Hypothyroidism, acquired   Closed intertrochanteric fracture of hip, right, initial encounter ([place])   Mild dementia   Leukocytosis   Right intertrochanteric femur fracture.  Status post IM nail Pain controlled.   ABLA mild, currently asymptomatic.  Hemoglobin 10.2<13.8  Up with therapy Incentive Spirometry Apply ice as needed  Weight Bearing: Weight Bearing as Tolerated (WBAT) RLE Dressings: Mepilex PRN.  VTE prophylaxis: Lovenox, SCDs, ambulation.  ASA 325 mg for 30 days after discharge. Dispo: Per primary.  Therapy evaluations pending.  Likely SNF-discharged to same following hip fracture earlier this year.   Subjective: Patient reports pain as mild.  Tolerating diet.  Urinating.  No CP, SOB, weakness or dizziness.  Not yet OOB.  Objective:   VITALS:   Vitals:   03/27/17 1837 03/27/17 2020 03/27/17 2348 03/28/17 0413  BP: 135/60 131/69 (!) 115/57 (!) 108/55  Pulse: 80 99 94 79  Resp: 13 16 16 16   Temp:  98.6 F (37 C) 97.9 F (36.6 C) 97.6 F (36.4 C)  TempSrc:  Oral Oral Oral  SpO2: 95% 96% 95% 96%  Weight:      Height:       CBC Latest Ref Rng & Units 03/28/2017 03/26/2017 11/08/2016  WBC 4.0 - 10.5 K/uL 7.4 10.8(H) 5.1  Hemoglobin 12.0 - 15.0 g/dL 10.2(L) 13.8 12.7  Hematocrit 36.0 - 46.0 % 31.3(L) 41.4 40.1  Platelets 150 - 400 K/uL 158 212 183   BMP Latest Ref Rng & Units 03/26/2017 11/08/2016 11/07/2016  Glucose 65 - 99 mg/dL 129(H) 100(H) 115(H)  BUN 6 - 20 mg/dL 21(H) 11 14  Creatinine 0.44 - 1.00 mg/dL 1.01(H) 1.22(H) 1.17(H)  Sodium 135 - 145 mmol/L 139 140 138  Potassium 3.5 - 5.1 mmol/L 4.0 4.2 3.7  Chloride 101 - 111 mmol/L 103 106 103  CO2 22 - 32 mmol/L 26 28 28   Calcium 8.9 - 10.3 mg/dL 9.0 8.6(L) 9.2   Intake/Output      10/29 0701 - 10/30 0700   I.V.  (mL/kg) 1150 (22.2)   Total Intake(mL/kg) 1150 (22.2)   Urine (mL/kg/hr) 1800 (1.5)   Blood 200   Total Output 2000   Net -850          Physical Exam: General: NAD.  Supine in bed.  Pleasant, calm, conversant.  No increased work of breathing. MSK RLE: Neurovascularly intact Sensation intact distally Feet warm Dorsiflexion/Plantar flexion intact Incision: dressing C/D/I-scant bloody drainage   Prudencio Burly III, PA-C 03/28/2017, 6:57 AM

## 2017-03-28 NOTE — NC FL2 (Signed)
[place] LEVEL OF CARE SCREENING TOOL     IDENTIFICATION  Patient Name: Julia [name] Birthdate: [birth year] Sex: female Admission Date (Current Location): 03/26/2017  [facility] and [place] Number:  Herbalist and Address:  [facility] [place] [place] [address] [place], [place] [address]      Provider Number: [id number]  Attending Physician Name and Address:  Nita Sells, MD  Relative Name and Phone Number:  Idelle Leech, daughter, [phone]    Current Level of Care: Hospital Recommended Level of Care: [place] Prior Approval Number:    Date Approved/Denied:   PASRR Number: [id number]  Discharge Plan: SNF    Current Diagnoses: Patient Active Problem List   Diagnosis Date Noted  . Closed intertrochanteric fracture of hip, right, initial encounter ([place]) 03/27/2017  . Mild dementia 03/27/2017  . Leukocytosis 03/27/2017  . Closed fracture of right hip ([place])   . Closed nondisplaced fracture of base of neck of right femur ([place]) 11/07/2016  . Sinus bradycardia 09/05/2014  . Junctional escape rhythm 09/05/2014  . Alzheimer's dementia 04/21/2014  . Overactive bladder 04/21/2014  . Hypothyroidism, acquired 11/21/2013  . PALPITATIONS, RECURRENT 12/09/2008  . Hyperlipidemia 04/18/2007  . Essential hypertension 12/11/2006  . GERD 12/11/2006  . Osteopenia 12/11/2006  . History of colonic polyps 12/11/2006    Orientation RESPIRATION BLADDER Height & Weight     Self, Time, Situation, Place  Normal Continent Weight: 114 lb (51.7 kg) Height:  5\' 4"  (162.6 cm)  BEHAVIORAL SYMPTOMS/MOOD NEUROLOGICAL BOWEL NUTRITION STATUS      Continent Diet (See DC Summary)  AMBULATORY STATUS COMMUNICATION OF NEEDS Skin   Limited Assist Verbally Surgical wounds                       Personal Care Assistance Level of Assistance  Dressing, Bathing, Feeding Bathing Assistance: Limited assistance Feeding assistance:  Limited assistance Dressing Assistance: Maximum assistance     Functional Limitations Info  Sight, Hearing, Speech Sight Info: Adequate Hearing Info: Adequate Speech Info: Adequate    SPECIAL CARE FACTORS FREQUENCY  OT (By licensed OT), PT (By licensed PT)     PT Frequency: 3x week OT Frequency: 2x week            Contractures Contractures Info: Not present    Additional Factors Info  Code Status, Allergies Code Status Info: DNR Allergies Info: ENTEX, SULFONAMIDE DERIVATIVES            Current Medications (03/28/2017):  This is the current hospital active medication list Current Facility-Administered Medications  Medication Dose Route Frequency Provider Last Rate Last Dose  . acetaminophen (TYLENOL) tablet 650 mg  650 mg Oral Q6H PRN Prudencio Burly III, PA-C   650 mg at 03/28/17 1031   Or  . acetaminophen (TYLENOL) suppository 650 mg  650 mg Rectal Q6H PRN Prudencio Burly III, PA-C      . amLODipine (NORVASC) tablet 5 mg  5 mg Oral Daily Fuller Plan A, MD   5 mg at 03/28/17 0946  . docusate sodium (COLACE) capsule 100 mg  100 mg Oral BID PRN Fuller Plan A, MD      . enoxaparin (LOVENOX) injection 40 mg  40 mg Subcutaneous Q24H Smith, Rondell A, MD   40 mg at 03/27/17 2354  . galantamine (RAZADYNE ER) 24 hr capsule 24 mg  24 mg Oral Q breakfast Fuller Plan A, MD   24 mg at 03/28/17 0946  .  HYDROcodone-acetaminophen (NORCO/VICODIN) 5-325 MG per tablet 1-2 tablet  1-2 tablet Oral Q6H PRN Norval Morton, MD   1 tablet at 03/28/17 0630  . lactated ringers infusion   Intravenous Continuous Prudencio Burly III, PA-C 75 mL/hr at 03/27/17 2355    . latanoprost (XALATAN) 0.005 % ophthalmic solution 1 drop  1 drop Both Eyes QHS Smith, Rondell A, MD   1 drop at 03/28/17 0105  . levothyroxine (SYNTHROID, LEVOTHROID) tablet 88 mcg  88 mcg Oral QAC breakfast Fuller Plan A, MD   88 mcg at 03/28/17 0630  . [START ON 03/29/2017] memantine (NAMENDA  XR) 24 hr capsule 28 mg  28 mg Oral QHS Skeet Simmer, [place]      . mirabegron ER [facility]) tablet 25 mg  25 mg Oral Daily Tamala Julian, Rondell A, MD   25 mg at 03/28/17 0945  . mirtazapine (REMERON) tablet 15 mg  15 mg Oral QHS Smith, Rondell A, MD   15 mg at 03/27/17 2354  . morphine 4 MG/ML injection 0.52 mg  0.52 mg Intravenous Q2H PRN Smith, Rondell A, MD      . pantoprazole (PROTONIX) EC tablet 40 mg  40 mg Oral Daily Tamala Julian, Rondell A, MD   40 mg at 03/28/17 0945  . polyethylene glycol (MIRALAX / GLYCOLAX) packet 17 g  17 g Oral Daily PRN Tamala Julian, Rondell A, MD      . QUEtiapine (SEROQUEL) tablet 25 mg  25 mg Oral QHS Smith, Rondell A, MD   25 mg at 03/27/17 2355  . rosuvastatin (CRESTOR) tablet 20 mg  20 mg Oral q1800 Norval Morton, MD         Discharge Medications: Please see discharge summary for a list of discharge medications.  Relevant Imaging Results:  Relevant Lab Results:   Additional Information SS#: [id number]  Normajean Baxter, LCSW

## 2017-03-29 DIAGNOSIS — D72829 Elevated white blood cell count, unspecified: Secondary | ICD-10-CM

## 2017-03-29 DIAGNOSIS — I10 Essential (primary) hypertension: Secondary | ICD-10-CM

## 2017-03-29 DIAGNOSIS — F0390 Unspecified dementia without behavioral disturbance: Secondary | ICD-10-CM

## 2017-03-29 DIAGNOSIS — S72141A Displaced intertrochanteric fracture of right femur, initial encounter for closed fracture: Principal | ICD-10-CM

## 2017-03-29 MED ORDER — DOCUSATE SODIUM 100 MG PO CAPS: 100.0000 mg | ORAL_CAPSULE | Freq: Two times a day (BID) | ORAL | Status: DC | PRN

## 2017-03-29 NOTE — Progress Notes (Signed)
Physical Therapy Treatment Patient Details Name: Julia [name] MRN: [id number] DOB: [birth year] Today's Date: 03/29/2017    History of Present Illness  Julia [name] is a 82 y.o. female with medical history significant of HTN, and hypothyroidism, dementia; who presents with complaints of hip pain after fall at home while trying to bring groceries into the house. She underwent R hip IM nail. Of note, pt fell and fractured R hip in 6/18.    PT Comments    Pt with slow progression towards goals. Anxious with mobility this session and required max encouragement for mobility. Required min A for gait and for mobility tasks. Pt continues to be limited by pain and fatigue. Current recommendations appropriate. Will continue to follow acutely.    Follow Up Recommendations  SNF;Supervision/Assistance - 24 hour     Equipment Recommendations  None recommended by PT    Recommendations for Other Services       Precautions / Restrictions Precautions Precautions: Fall Precaution Comments: pt just fell and broke same hip in 6/18 Restrictions Weight Bearing Restrictions: Yes RLE Weight Bearing: Weight bearing as tolerated    Mobility  Bed Mobility Overal bed mobility: Needs Assistance Bed Mobility: Supine to Sit     Supine to sit: Min assist     General bed mobility comments: Min A for trunk elevation and to assist with scooting RLE to EOB. Required extended time and use of bed rails and elevated HOB.   Transfers Overall transfer level: Needs assistance Equipment used: Rolling walker (2 wheeled) Transfers: Sit to/from Stand Sit to Stand: Min assist         General transfer comment: Min A for steadying assist. Verbal cues for safe hand placement. Pt very anxious to stand and required encouragement to stand.   Ambulation/Gait Ambulation/Gait assistance: Min assist Ambulation Distance (Feet): 50 Feet Assistive device: Rolling walker (2 wheeled) Gait Pattern/deviations:  Step-through pattern;Decreased weight shift to right;Antalgic Gait velocity: decreased Gait velocity interpretation: Below normal speed for age/gender General Gait Details: Pt required min A for steadying throughout. Pt ambulating well for first half of gait, however, during second half pt getting anxious and reports she is not sure she could make it back. Required max encouragement to continue gait training. Pt reaching for front of RW and required cues for upright posture and appropriate hand placement on RW. Verbal cues for sequencing with RW. Further distance limited by fatigue.    Stairs            Wheelchair Mobility    Modified Rankin (Stroke Patients Only)       Balance Overall balance assessment: Needs assistance Sitting-balance support: Feet supported Sitting balance-Leahy Scale: Fair     Standing balance support: Bilateral upper extremity supported Standing balance-Leahy Scale: Poor Standing balance comment: reliant on UE support                            Cognition Arousal/Alertness: Awake/alert Behavior During Therapy: Anxious Overall Cognitive Status: History of cognitive impairments - at baseline                                 General Comments: Dementia at baseline. Anxious during mobility and required max encouragement to keep going.       Exercises General Exercises - Lower Extremity Ankle Circles/Pumps: AROM;Both;20 reps Quad Sets: AROM;Right;10 reps Heel Slides: AROM;Right;10 reps (limited range )  General Comments General comments (skin integrity, edema, etc.): Pt's daughter present during session.       Pertinent Vitals/Pain Pain Assessment: Faces Faces Pain Scale: Hurts even more Pain Location: R hip Pain Descriptors / Indicators: Tender;Sore Pain Intervention(s): Limited activity within patient's tolerance;Monitored during session;Repositioned    Home Living                      Prior Function             PT Goals (current goals can now be found in the care plan section) Acute Rehab PT Goals Patient Stated Goal: return home  PT Goal Formulation: With patient Time For Goal Achievement: 04/11/17 Potential to Achieve Goals: Good Progress towards PT goals: Progressing toward goals (slowly )    Frequency    Min 3X/week      PT Plan Current plan remains appropriate    Co-evaluation              AM-PAC PT "6 Clicks" Daily Activity  Outcome Measure  Difficulty turning over in bed (including adjusting bedclothes, sheets and blankets)?: Unable Difficulty moving from lying on back to sitting on the side of the bed? : Unable Difficulty sitting down on and standing up from a chair with arms (e.g., wheelchair, bedside commode, etc,.)?: Unable Help needed moving to and from a bed to chair (including a wheelchair)?: A Little Help needed walking in hospital room?: A Little Help needed climbing 3-5 steps with a railing? : A Lot 6 Click Score: 11    End of Session Equipment Utilized During Treatment: Gait belt Activity Tolerance: Patient limited by fatigue Patient left: in chair;with call bell/phone within reach;with chair alarm set;with family/visitor present Nurse Communication: Mobility status PT Visit Diagnosis: Unsteadiness on feet (R26.81);Repeated falls (R29.6);Muscle weakness (generalized) (M62.81);Difficulty in walking, not elsewhere classified (R26.2);History of falling (Z91.81);Pain Pain - Right/Left: Right Pain - part of body: Hip     Time: 5625-6389 PT Time Calculation (min) (ACUTE ONLY): 23 min  Charges:  $Gait Training: 8-22 mins $Therapeutic Exercise: 8-22 mins                    G Codes:       Leighton Ruff, PT, DPT  Acute Rehabilitation Services  Pager: [phone]    Rudean Hitt 03/29/2017, 12:29 PM

## 2017-03-29 NOTE — Progress Notes (Signed)
Patient will discharge to [facility]. Anticipated discharge date: 03/29/17 Family notified: Idelle Leech, daughter Transportation by: [facility]  Nurse to call report to [phone]. Patient will go to room 507 at facility.   CSW signing off.  Estanislado Emms, [place]  Clinical Social Worker

## 2017-03-29 NOTE — Clinical Social Work Placement (Signed)
   CLINICAL SOCIAL WORK PLACEMENT  NOTE  Date:  03/29/2017  Patient Details  Name: Julia [name] MRN: [id number] Date of Birth: [birth year]  Clinical Social Work is seeking post-discharge placement for this patient at the [place] level of care (*CSW will initial, date and re-position this form in  chart as items are completed):  Yes   Patient/family provided with [place] Work Department's list of facilities offering this level of care within the geographic area requested by the patient (or if unable, by the patient's family).  Yes   Patient/family informed of their freedom to choose among providers that offer the needed level of care, that participate in Medicare, Medicaid or managed care program needed by the patient, have an available bed and are willing to accept the patient.  Yes   Patient/family informed of [facility]'s ownership interest in [facility] and [facility], as well as of the fact that they are under no obligation to receive care at these facilities.  PASRR submitted to EDS on       PASRR number received on 03/27/17     Existing PASRR number confirmed on       FL2 transmitted to all facilities in geographic area requested by pt/family on 03/27/17     FL2 transmitted to all facilities within larger geographic area on       Patient informed that his/her managed care company has contracts with or will negotiate with certain facilities, including the following:  [facility]     Yes   Patient/family informed of bed offers received.  Patient chooses bed at [facility]     Physician recommends and patient chooses bed at      Patient to be transferred to [facility] on 03/29/17.  Patient to be transferred to facility by [facility]     Patient family notified on 03/29/17 of transfer.  Name of family member notified:  Idelle Leech, daughter     PHYSICIAN Please sign DNR     Additional Comment:     _______________________________________________ Estanislado Emms, LCSW 03/29/2017, 1:22 PM

## 2017-03-29 NOTE — Progress Notes (Signed)
   Assessment / Plan: 2 Days Post-Op  S/P Procedure(s) (LRB): INTRAMEDULLARY (IM) NAIL FEMORAL (Right) by Dr. Ernesta Amble. Murphy on 03/27/2017  Active Problems:   Essential hypertension   Hypothyroidism, acquired   Closed intertrochanteric fracture of hip, right, initial encounter ([place])   Mild dementia   Leukocytosis   Right intertrochanteric femur fracture.  Status post IM nail Pain controlled.  Motivated and Mobilizing well w/ therapy. Stable for discharge from an orthopedic perspective.  Continue to mobilize with therapy Incentive Spirometry Apply ice as needed  Weight Bearing: Weight Bearing as Tolerated (WBAT) RLE Dressings: Mepilex PRN.  VTE prophylaxis: Lovenox, SCDs, ambulation.  ASA 325 mg for 30 days after discharge. Dispo: SNF.  Placement planning in process.  Follow up in the office with Dr. Alain Marion in 2 weeks.  Please call with questions.  Subjective: Patient reports pain as mild.  Tolerating diet.  Urinating.  No CP, SOB, weakness or dizziness.  OOB walking in hall.  Objective:   VITALS:   Vitals:   03/28/17 1253 03/28/17 1610 03/28/17 2216 03/29/17 0559  BP: 131/70 (!) 114/59 (!) 128/53 (!) 143/58  Pulse: 91 85 87 79  Resp:   16 18  Temp: 97.6 F (36.4 C) 98.1 F (36.7 C) (!) 97.4 F (36.3 C) 98.4 F (36.9 C)  TempSrc: Oral Oral Oral Oral  SpO2: 96% 98% 94% 97%  Weight:      Height:       CBC Latest Ref Rng & Units 03/28/2017 03/26/2017 11/08/2016  WBC 4.0 - 10.5 K/uL 7.4 10.8(H) 5.1  Hemoglobin 12.0 - 15.0 g/dL 10.2(L) 13.8 12.7  Hematocrit 36.0 - 46.0 % 31.3(L) 41.4 40.1  Platelets 150 - 400 K/uL 158 212 183   BMP Latest Ref Rng & Units 03/28/2017 03/26/2017 11/08/2016  Glucose 65 - 99 mg/dL 151(H) 129(H) 100(H)  BUN 6 - 20 mg/dL 18 21(H) 11  Creatinine 0.44 - 1.00 mg/dL 1.22(H) 1.01(H) 1.22(H)  Sodium 135 - 145 mmol/L 137 139 140  Potassium 3.5 - 5.1 mmol/L 4.5 4.0 4.2  Chloride 101 - 111 mmol/L 104 103 106  CO2 22 - 32 mmol/L 22 26 28     Calcium 8.9 - 10.3 mg/dL 8.6(L) 9.0 8.6(L)   Intake/Output      10/30 0701 - 10/31 0700 10/31 0701 - 11/01 0700   I.V. (mL/kg) 0 (0)    Total Intake(mL/kg) 0 (0)    Net 0             Physical Exam: General: NAD.  Upright in bed.  Pleasant, calm, conversant.  No increased work of breathing.  Son at bedside. MSK RLE: Neurovascularly intact Sensation intact distally Feet warm Dorsiflexion/Plantar flexion intact Incision: dressing C/D/I-scant bloody drainage stable   Prudencio Burly III, PA-C 03/29/2017, 8:28 AM

## 2017-03-29 NOTE — Social Work (Addendum)
CSW discussed bed offers with patient and daughter via telephone. Patient desires to go home and knows the benefit of SNF. They have accepted bed offer for [facility].  CSW confirmed with SNF.  DNR on chart needs to be signed.  CSW will follow up for disposition.  Elissa Hefty, LCSW Clinical Social Worker [phone]

## 2017-03-29 NOTE — Discharge Instructions (Signed)
Hip Fracture A hip fracture is a fracture of the upper part of your thigh bone (femur). What are the causes? A hip fracture is caused by a direct blow to the side of your hip. This is usually the result of a fall but can occur in other circumstances, such as an automobile accident. What increases the risk? There is an increased risk of hip fractures in people with:  An unsteady walking pattern (gait) and those with conditions that contribute to poor balance, such as Parkinson's disease or dementia.  Osteopenia and osteoporosis.  Cancer that spreads to the leg bones.  Certain metabolic diseases.  What are the signs or symptoms? Symptoms of hip fracture include:  Pain over the injured hip.  Inability to put weight on the leg in which the fracture occurred (although, some patients are able to walk after a hip fracture).  Toes and foot of the affected leg point outward when you lie down.  How is this diagnosed? A physical exam can determine if a hip fracture is likely to have occurred. X-ray exams are needed to confirm the fracture and to look for other injuries. The X-ray exam can help to determine the type of hip fracture. Rarely, the fracture is not visible on an X-ray image and a CT scan or MRI will have to be done. How is this treated? The treatment for a fracture is usually surgery. This means using a screw, nail, or rod to hold the bones in place. Follow these instructions at home: Take all medicines as directed by your health care provider. Contact a health care provider if: Pain continues, even after taking pain medicine. This information is not intended to replace advice given to you by your health care provider. Make sure you discuss any questions you have with your health care provider. Document Released: 05/16/2005 Document Revised: 10/22/2015 Document Reviewed: 12/26/2012 Elsevier Interactive Patient Education  2017 [employer]  

## 2017-03-29 NOTE — Discharge Summary (Addendum)
Physician Discharge Summary  Julia [name] MGQ:[id number] DOB: 1935-04-13 DOA: 03/26/2017  PCP: Julia Peng, NP  Admit date: 03/26/2017 Discharge date: 03/29/2017  Admitted From: Home Disposition: SNF  Recommendations for Outpatient Follow-up:  1. Follow up with PCP in 1 week 2. Please obtain BMP/CBC in one week 3. Aspirin 325 mg for 30 days after discharge. 4. Please follow up on the following pending results: None  Home Health: SNF Equipment/Devices: SNF  Discharge Condition: Stable CODE STATUS: DNR Diet recommendation: Heart healthy   Brief/Interim Summary:  Admission HPI written by Julia Morton, MD   Chief Complaint: Fall  HPI: Julia [name] is a 82 y.o. female with medical history significant of HTN, and hypothyroidism; who presents with complaints of right hip pain after fall at home while trying to bring groceries into the house.  At baseline she lives with her husband and ambulates without need of assistance despite having some balance issues. She does not know exactly what happened, but describes it as slowly going down to the ground with her right leg going underneath her. She notes that she did not lose consciousness or hit her head.  Denies having any chest pain, shortness of breath, nausea, vomiting, dysuria, or urinary frequency symptoms.  Patient with previous fall on 11/07/2016 resulting in right hip femoral neck fracture requiring surgical correction performed by Dr. Edmonia [name] of orthopedics.  ED Course: On admission into the emergency department patient was noted to be afebrile, pulse 50-65, respiration 18-20, blood pressures 143/62-180 1/64, O2 saturations maintained on RA.  Labs revealed WBC 10.8, BUN 21, creatinine 1.01.  Imaging studies revealed a closed displaced intertrochanteric fracture.  Chest x-ray was negative for any acute abnormalities.  Julia [name] was on call for orthopedics and recommended transfer to [facility] for possible need of  surgery.    Hospital course:  Hip fracture, left Status post intramedullary nail on 03/27/17. Orthopedic surgery recommendating Aspirin 325 mg for 30 days postop.  Physical therapy recommending SNF.  Pain controlled with Norco 5/325 mg.  Anemia secondary to blood loss Affected after surgery.  Hemoglobin stable.  Recheck 1 week.  Essential hypertension Amlodipine.  Hyperlipidemia Crestor.  Dementia Mild.  Continue Namenda and Razadyne  Hypothyroidism Synthroid.   Discharge Diagnoses:  Active Problems:   Essential hypertension   Hypothyroidism, acquired   Closed intertrochanteric fracture of hip, right, initial encounter ([place])   Mild dementia   Leukocytosis    Discharge Instructions  Discharge Instructions    Increase activity slowly    Complete by:  As directed      Allergies as of 03/29/2017      Reactions   Entex Other (See Comments)   unknown   Sulfonamide Derivatives Other (See Comments)   unknown      Medication List    STOP taking these medications   Ibuprofen 200 MG Caps   triamcinolone cream 0.1 % Commonly known as:  KENALOG     TAKE these medications   amLODipine 5 MG tablet Commonly known as:  NORVASC Take 1 tablet (5 mg total) by mouth daily.   aspirin EC 325 MG tablet Take 1 tablet (325 mg total) by mouth daily. For 30 days post op for DVT Prophylaxis What changed:  additional instructions   docusate sodium 100 MG capsule Commonly known as:  COLACE Take 1 capsule (100 mg total) by mouth 2 (two) times daily. To prevent constipation while taking pain medication. What changed:  You were already taking a medication  with the same name, and this prescription was added. Make sure you understand how and when to take each.   docusate sodium 100 MG capsule Commonly known as:  COLACE Take 1 capsule (100 mg total) by mouth 2 (two) times daily as needed for mild constipation or moderate constipation. What changed:  when to take  this  reasons to take this  additional instructions   Fish Oil 1000 MG Caps Take 1,000 mg by mouth daily.   galantamine 24 MG 24 hr capsule Commonly known as:  RAZADYNE ER TAKE 1 CAPSULE BY MOUTH DAILY WITH BREAKFAST   HYDROcodone-acetaminophen 5-325 MG tablet Commonly known as:  NORCO Take 1-2 tablets by mouth every 6 (six) hours as needed for moderate pain.   latanoprost 0.005 % ophthalmic solution Commonly known as:  XALATAN Place 1 drop into both eyes at bedtime.   levothyroxine 88 MCG tablet Commonly known as:  SYNTHROID, LEVOTHROID TAKE 1 TABLET BY MOUTH DAILY BEFORE BREAKFAST What changed:  how much to take  how to take this  when to take this  additional instructions   memantine 28 MG Cp24 24 hr capsule Commonly known as:  NAMENDA XR Take 1 capsule (28 mg total) by mouth daily.   mirtazapine 15 MG tablet Commonly known as:  REMERON Take 1 tablet (15 mg total) by mouth at bedtime.   multivitamin tablet Take 1 tablet by mouth daily.   MYRBETRIQ 25 MG Tb24 tablet Generic drug:  mirabegron ER Take 25 mg by mouth daily.   omeprazole 20 MG capsule Commonly known as:  PRILOSEC Take 1 capsule (20 mg total) by mouth daily. 30 days for gastroprotection while taking Aspirin. What changed:  additional instructions   ondansetron 4 MG tablet Commonly known as:  ZOFRAN Take 1 tablet (4 mg total) by mouth every 8 (eight) hours as needed for nausea or vomiting.   polyethylene glycol packet Commonly known as:  MIRALAX / GLYCOLAX Take 17 g by mouth daily as needed for mild constipation. Reported on 10/17/2015   QUEtiapine 25 MG tablet Commonly known as:  SEROQUEL Take 1 tablet (25 mg total) by mouth at bedtime.   rosuvastatin 20 MG tablet Commonly known as:  CRESTOR Take 1 tablet (20 mg total) by mouth once a week.       Contact information for follow-up providers    Julia Butters, MD Follow up in 2 week(s).   Specialty:  Orthopedic Surgery Contact  information: [address] [place] [address] [phone]            Contact information for after-discharge care    Destination    [facility] .   Specialty:  [place] information: [address] [place] [phone]                 Allergies  Allergen Reactions  . Entex Other (See Comments)    unknown  . Sulfonamide Derivatives Other (See Comments)    unknown    Consultations:  Orthopedic surgery   Procedures/Studies: Dg Chest 1 View  Result Date: 03/26/2017 CLINICAL DATA:  82 year old female with fall and probable right hip fracture. EXAM: CHEST 1 VIEW COMPARISON:  Chest radiograph dated 11/07/2016 FINDINGS: There is mild diffuse interstitial coarsening, chronic. No focal consolidation, pleural effusion, or pneumothorax. The cardiac silhouette is within normal limits. There is osteopenia with degenerative changes of the spine. No acute osseous pathology. IMPRESSION: No active disease. Electronically Signed  By: Anner Crete M.D.   On: 03/26/2017 22:41   Pelvis Portable  Result Date: 03/27/2017 CLINICAL DATA:  Right hip surgery for fracture. EXAM: PORTABLE PELVIS 1-2 VIEWS COMPARISON:  Intraoperative films of earlier the same day. FINDINGS: Frontal view of the right femur shows dynamic hip screw with long antegrade the intramedullary femoral nail with a single distal interlocking screw. No interval change. No evidence for immediate hardware complications. IMPRESSION: Status post ORIF for intertrochanteric right hip fracture. No evidence for immediate hardware complications. Electronically Signed   By: Misty Stanley M.D.   On: 03/27/2017 20:44   Ct Hip Right Wo Contrast  Result Date: 03/27/2017 CLINICAL DATA:  82 year old female with right hip fracture after fall. EXAM: CT OF THE RIGHT HIP WITHOUT CONTRAST TECHNIQUE: Multidetector CT imaging of the right hip was performed  according to the standard protocol. Multiplanar CT image reconstructions were also generated. COMPARISON:  Radiograph dated 03/26/2017 and 11/07/2016. FINDINGS: Bones/Joint/Cartilage There is an acute comminuted and displaced oblique fracture of the proximal right femur. The main fracture is an oblique intertrochanteric fracture which extends anterior to the femoral neck fixation screws. The fracture appears to abut the anterior most screw. Evaluation of the fracture is however limited due to streak artifact caused by metallic screws as well as osteopenia. There is approximately 8 mm distraction gap. There is mild proximal migration of the femoral shaft in relation to the neck of the femur. There is no dislocation. Ligaments Suboptimally assessed by CT. Muscles and Tendons No large hematoma. Mild edema in the musculature of the right thigh. Soft tissues Large amount of stool noted within the visualized colon. There are small scattered colonic diverticula. IMPRESSION: Comminuted appearing mildly displaced predominantly intertrochanteric fracture of the proximal right femur as described. The main fracture is anterior to the fixation screws of the femur. Electronically Signed   By: Anner Crete M.D.   On: 03/27/2017 00:51   Dg C-arm 1-60 Min  Result Date: 03/27/2017 CLINICAL DATA:  ORIF proximal right femur fracture EXAM: RIGHT FEMUR 2 VIEWS; DG C-ARM 61-120 MIN COMPARISON:  03/26/2017 right hip radiographs FINDINGS: Fluoroscopy time 1 minutes 53 seconds. Multiple spot fluoroscopic nondiagnostic intraoperative radiographs of the right femur were provided, which demonstrate interval removal of the previous right femoral neck screws with placement of a new intramedullary rod in the right femoral shaft with interlocking right femoral neck pin transfixing the intertrochanteric right proximal femur fracture, and with distal interlocking screw. IMPRESSION: Intraoperative fluoroscopic guidance for ORIF right proximal  femur fracture. Electronically Signed   By: Ilona Sorrel M.D.   On: 03/27/2017 16:55   Dg Hip Unilat  With Pelvis 2-3 Views Right  Result Date: 03/26/2017 CLINICAL DATA:  Fall tonight with right hip pain. EXAM: DG HIP (WITH OR WITHOUT PELVIS) 2-3V RIGHT COMPARISON:  Postoperative radiograph 11/07/2016 FINDINGS: Three partially cannulated screws traverse the intertrochanteric femur site fixating prior femoral neck fracture. Acute displaced fracture of the right proximal femur is likely intertrochanteric, fracture may extend through the proximal aspect of the screws. The femoral head remains seated. Pubic rami are intact. Pubic symphysis is congruent. IMPRESSION: Acute proximal femur fracture, likely intertrochanteric and displaced. Fracture may be through proximal femoral screws traversing prior femoral neck fracture. CT would provide better definition of fracture planes. Electronically Signed   By: Jeb Levering M.D.   On: 03/26/2017 22:39   Dg Femur, Min 2 Views Right  Result Date: 03/27/2017 CLINICAL DATA:  ORIF proximal right femur fracture EXAM: RIGHT FEMUR  2 VIEWS; DG C-ARM 61-120 MIN COMPARISON:  03/26/2017 right hip radiographs FINDINGS: Fluoroscopy time 1 minutes 53 seconds. Multiple spot fluoroscopic nondiagnostic intraoperative radiographs of the right femur were provided, which demonstrate interval removal of the previous right femoral neck screws with placement of a new intramedullary rod in the right femoral shaft with interlocking right femoral neck pin transfixing the intertrochanteric right proximal femur fracture, and with distal interlocking screw. IMPRESSION: Intraoperative fluoroscopic guidance for ORIF right proximal femur fracture. Electronically Signed   By: Ilona Sorrel M.D.   On: 03/27/2017 16:55     Subjective: Not much pain today.  Discharge Exam: Vitals:   03/28/17 2216 03/29/17 0559  BP: (!) 128/53 (!) 143/58  Pulse: 87 79  Resp: 16 18  Temp: (!) 97.4 F (36.3  C) 98.4 F (36.9 C)  SpO2: 94% 97%   Vitals:   03/28/17 1253 03/28/17 1610 03/28/17 2216 03/29/17 0559  BP: 131/70 (!) 114/59 (!) 128/53 (!) 143/58  Pulse: 91 85 87 79  Resp:   16 18  Temp: 97.6 F (36.4 C) 98.1 F (36.7 C) (!) 97.4 F (36.3 C) 98.4 F (36.9 C)  TempSrc: Oral Oral Oral Oral  SpO2: 96% 98% 94% 97%  Weight:      Height:        General: Pt is alert, awake, not in acute distress Cardiovascular: RRR, S1/S2 +, no rubs, no gallops Respiratory: CTA bilaterally, no wheezing, no rhonchi Abdominal: Soft, NT, ND, bowel sounds + Extremities: no edema, no cyanosis    The results of significant diagnostics from this hospitalization (including imaging, microbiology, ancillary and laboratory) are listed below for reference.     Microbiology: Recent Results (from the past 240 hour(s))  Surgical pcr screen     Status: None   Collection Time: 03/27/17  4:16 AM  Result Value Ref Range Status   MRSA, PCR NEGATIVE NEGATIVE Final   Staphylococcus aureus NEGATIVE NEGATIVE Final    Comment: (NOTE) The Xpert SA Assay (FDA approved for NASAL specimens in patients 13 years of age and older), is one component of a comprehensive surveillance program. It is not intended to diagnose infection nor to guide or monitor treatment.      Labs: BNP (last 3 results) No results for input(s): BNP in the last 8760 hours. Basic Metabolic Panel:  Recent Labs Lab 03/26/17 2325 03/28/17 0539  NA 139 137  K 4.0 4.5  CL 103 104  CO2 26 22  GLUCOSE 129* 151*  BUN 21* 18  CREATININE 1.01* 1.22*  CALCIUM 9.0 8.6*   Liver Function Tests:  Recent Labs Lab 03/28/17 0539  AST 29  ALT 16  ALKPHOS 44  BILITOT 0.8  PROT 5.0*  ALBUMIN 2.9*   No results for input(s): LIPASE, AMYLASE in the last 168 hours. No results for input(s): AMMONIA in the last 168 hours. CBC:  Recent Labs Lab 03/26/17 2325 03/28/17 0539  WBC 10.8* 7.4  NEUTROABS 9.2* 5.5  HGB 13.8 10.2*  HCT 41.4  31.3*  MCV 88.7 88.4  PLT 212 158   Cardiac Enzymes: No results for input(s): CKTOTAL, CKMB, CKMBINDEX, TROPONINI in the last 168 hours. BNP: Invalid input(s): POCBNP CBG: No results for input(s): GLUCAP in the last 168 hours. D-Dimer No results for input(s): DDIMER in the last 72 hours. Hgb A1c No results for input(s): HGBA1C in the last 72 hours. Lipid Profile No results for input(s): CHOL, HDL, LDLCALC, TRIG, CHOLHDL, LDLDIRECT in the last 72 hours. Thyroid function studies  No results for input(s): TSH, T4TOTAL, T3FREE, THYROIDAB in the last 72 hours.  Invalid input(s): FREET3 Anemia work up No results for input(s): VITAMINB12, FOLATE, FERRITIN, TIBC, IRON, RETICCTPCT in the last 72 hours. Urinalysis    Component Value Date/Time   COLORURINE YELLOW 10/28/2016 1620   APPEARANCEUR HAZY (A) 10/28/2016 1620   LABSPEC 1.016 10/28/2016 1620   PHURINE 6.0 10/28/2016 1620   GLUCOSEU NEGATIVE 10/28/2016 1620   HGBUR NEGATIVE 10/28/2016 1620   HGBUR negative 03/02/2010 1020   BILIRUBINUR NEGATIVE 10/28/2016 1620   BILIRUBINUR n 07/09/2015 1041   KETONESUR NEGATIVE 10/28/2016 1620   PROTEINUR NEGATIVE 10/28/2016 1620   UROBILINOGEN 1.0 07/09/2015 1041   UROBILINOGEN 1.0 09/05/2014 0400   NITRITE NEGATIVE 10/28/2016 1620   LEUKOCYTESUR TRACE (A) 10/28/2016 1620   Sepsis Labs Invalid input(s): PROCALCITONIN,  WBC,  LACTICIDVEN Microbiology Recent Results (from the past 240 hour(s))  Surgical pcr screen     Status: None   Collection Time: 03/27/17  4:16 AM  Result Value Ref Range Status   MRSA, PCR NEGATIVE NEGATIVE Final   Staphylococcus aureus NEGATIVE NEGATIVE Final    Comment: (NOTE) The Xpert SA Assay (FDA approved for NASAL specimens in patients 76 years of age and older), is one component of a comprehensive surveillance program. It is not intended to diagnose infection nor to guide or monitor treatment.      Time coordinating discharge: Over 30  minutes  SIGNED:   Cordelia Poche, MD [facility] 03/29/2017, 12:49 PM Pager [phone]  If 7PM-7AM, please contact night-coverage [email or web address] Password [email or web address]

## 2017-03-29 NOTE — Progress Notes (Signed)
Pt discharging to SNF, [facility]. D/C instructions placed in pt's packet to send to facility, copy made for facility and one for pt/family placed in packet for transporters to take with pt.  Pt d/c'd with belongings, transported by [place].

## 2017-04-17 ENCOUNTER — Telehealth: Payer: Self-pay | Admitting: Family Medicine

## 2017-04-17 ENCOUNTER — Telehealth: Payer: Self-pay | Admitting: Adult Health

## 2017-04-17 NOTE — Telephone Encounter (Signed)
error 

## 2017-04-17 NOTE — Telephone Encounter (Signed)
Spoke to [place] at [facility]  Instructed her to proceed with verbal order to monitor sacrum wound, PT and OT.  Advised she contact surgeon to see if pt is to return to regular 81 mg ASA after 30 days on 325.  Omeprazole for 30 days to help protect stomach from ASA.  Advised that Tommi Rumps does not write for any narcotics.  Verdis Frederickson only wanted to report the pt is not taking her pain meds.  Julia [name] to call back if needed.

## 2017-04-17 NOTE — Telephone Encounter (Signed)
Cory,  You do not prescribe ASA or pain medication.

## 2017-04-17 NOTE — Telephone Encounter (Signed)
[place] for verbal orders.   ASA dose is 325 mg daily for 30 days after being out of the hospital. Omeprazole was written for the 30 days as well to protect her stomach from the ASA  I did not write for narcotics, but if she doesn't need them then dont give them

## 2017-04-17 NOTE — Telephone Encounter (Signed)
Copied from [place] [phone]. Topic: General - Other >> Apr 17, 2017 11:24 AM Lennox Solders wrote: Reason for CRM: maria from [facility] needs verbal order for nursing to monitor sacrum wound and PT and OT . Verdis Frederickson needs to verify ASA dose. Pt has 4 medication she feels like she does not need omeprazole, prostat, percocet and oxycontin. Pt did not get  rx . Verdis Frederickson would like to know if pt can take advil for pain

## 2017-04-18 ENCOUNTER — Ambulatory Visit: Payer: Self-pay | Admitting: Adult Health

## 2017-05-17 ENCOUNTER — Ambulatory Visit: Payer: Self-pay | Admitting: Neurology

## 2017-05-24 ENCOUNTER — Ambulatory Visit (INDEPENDENT_AMBULATORY_CARE_PROVIDER_SITE_OTHER): Payer: Medicare Other | Admitting: Adult Health

## 2017-05-24 ENCOUNTER — Ambulatory Visit: Payer: Self-pay | Admitting: Adult Health

## 2017-05-24 ENCOUNTER — Encounter: Payer: Self-pay | Admitting: Adult Health

## 2017-05-24 VITALS — BP 136/62 | Temp 98.1°F | Wt 104.0 lb

## 2017-05-24 DIAGNOSIS — L0211 Cutaneous abscess of neck: Secondary | ICD-10-CM

## 2017-05-24 MED ORDER — DOXYCYCLINE HYCLATE 100 MG PO CAPS: 100.0000 mg | ORAL_CAPSULE | Freq: Two times a day (BID) | ORAL | 0 refills | Status: DC

## 2017-05-24 NOTE — Patient Instructions (Signed)
Pull drain in 24 hours   Follow up with me on Friday

## 2017-05-24 NOTE — Addendum Note (Signed)
Addended by: Apolinar Junes on: 05/24/2017 02:34 PM   Modules accepted: Orders

## 2017-05-24 NOTE — Progress Notes (Addendum)
Subjective:    Patient ID: Julia [name], female    DOB: [birth year], 82 y.o.   MRN: [id number]  HPI  82 year old female who  has a past medical history of Alzheimer's dementia, Anxiety, Concussion with no loss of consciousness (05/14/2008), Dementia, GERD (gastroesophageal reflux disease), History of colonic polyps, echocardiogram, Hyperlipidemia, Hypertension, Hypothyroidism, Osteopenia, Right forearm fracture (2011), and Thyroid disease.  She presents to the office today with her daughter for abscess on right side of neck. Noticed increasing in size over the last 1-2 days. Started draining pus last night. Endorses tenderness with palpation. Has not been placing any antibiotic ointment on wound   Review of Systems See HPI   Past Medical History:  Diagnosis Date  . Alzheimer's dementia   . Anxiety   . Concussion with no loss of consciousness 05/14/2008   Qualifier: Diagnosis of  By: Arnoldo Morale MD, Balinda Quails   . Dementia   . GERD (gastroesophageal reflux disease)   . History of colonic polyps   . Hx of echocardiogram    Echo 4/16:  EF 55-60%, mild LVH  . Hyperlipidemia   . Hypertension   . Hypothyroidism   . Osteopenia   . Right forearm fracture 2011  . Thyroid disease    hypothyroidism    Social History   Socioeconomic History  . Marital status: Married    Spouse name: Not on file  . Number of children: Not on file  . Years of education: Not on file  . Highest education level: Not on file  Social Needs  . Financial resource strain: Not on file  . Food insecurity - worry: Not on file  . Food insecurity - inability: Not on file  . Transportation needs - medical: Not on file  . Transportation needs - non-medical: Not on file  Occupational History  . Occupation: retired  Tobacco Use  . Smoking status: Never Smoker  . Smokeless tobacco: Never Used  Substance and Sexual Activity  . Alcohol use: No    Alcohol/week: 0.0 oz  . Drug use: No  . Sexual activity: No  Other  Topics Concern  . Not on file  Social History Narrative   Married to husband Ardyth Gal for 64 years and lives in [place]. 2 daughters live in [place], 1 son in [place] and 1 in [place].       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       Hobbies: tv, caring for cat (very important to her), change is difficult even with type of tv      Jake Samples (daughter) Chauncey Reading [phone]   Full Code    Past Surgical History:  Procedure Laterality Date  . ABDOMINAL HYSTERECTOMY  2009  . BILATERAL SALPINGOOPHORECTOMY    . bladder tack    . CATARACT EXTRACTION Right at least a year ago  . FEMUR IM NAIL Right 03/27/2017   Procedure: INTRAMEDULLARY (IM) NAIL FEMORAL;  Surgeon: Renette Butters, MD;  Location: [facility];  Service: Orthopedics;  Laterality: Right;  . HIP PINNING,CANNULATED Right 11/07/2016   Procedure: RIGHT CANNULATED HIP PINNING;  Surgeon: Renette Butters, MD;  Location: [facility];  Service: Orthopedics;  Laterality: Right;  . INCISION AND DRAINAGE OF WOUND Left 08/04/2015   Procedure: DEBRIDEMENT DISTAL INTERPHALANGEAL LEFT INDEX FINGER;  Surgeon: Daryll Brod, MD;  Location: [facility];  Service: Orthopedics;  Laterality: Left;  Marland Kitchen MASS EXCISION N/A 08/04/2015   Procedure: EXCISION MUCOID CYST;  Surgeon: Dominica Severin  Fredna Dow, MD;  Location: [facility];  Service: Orthopedics;  Laterality: N/A;  . MASS EXCISION Left 11/24/2015   Procedure: EXCISION RECURRENT  MUCOID CYST LEFT INDEX  FINGER;  Surgeon: Daryll Brod, MD;  Location: [facility];  Service: Orthopedics;  Laterality: Left;  . ROTATOR CUFF REPAIR     right shoulder  . surgical repair to fx left wrist    . TONSILLECTOMY      Family History  Problem Relation Age of Onset  . Hypertension Mother   . COPD Mother   . Tuberculosis Mother        x 2  . Lung cancer Father   . Stomach cancer Father   . Heart disease Brother   . Drug abuse Maternal Uncle   . Cancer Maternal Uncle   .  Alzheimer's disease Maternal Aunt   . Colon cancer Neg Hx   . Esophageal cancer Neg Hx   . Rectal cancer Neg Hx   . Heart attack Neg Hx   . Stroke Neg Hx     Allergies  Allergen Reactions  . Entex Other (See Comments)    unknown  . Sulfonamide Derivatives Other (See Comments)    unknown    Current Outpatient Medications on File Prior to Visit  Medication Sig Dispense Refill  . amLODipine (NORVASC) 5 MG tablet Take 1 tablet (5 mg total) by mouth daily. 90 tablet 3  . aspirin EC 81 MG tablet Take 81 mg by mouth daily.    Marland Kitchen galantamine (RAZADYNE ER) 24 MG 24 hr capsule TAKE 1 CAPSULE BY MOUTH DAILY WITH BREAKFAST 90 capsule 3  . latanoprost (XALATAN) 0.005 % ophthalmic solution Place 1 drop into both eyes at bedtime.    Marland Kitchen levothyroxine (SYNTHROID, LEVOTHROID) 88 MCG tablet TAKE 1 TABLET BY MOUTH DAILY BEFORE BREAKFAST (Patient taking differently: Take 88 mcg by mouth daily before breakfast. ) 90 tablet 1  . memantine (NAMENDA XR) 28 MG CP24 24 hr capsule Take 1 capsule (28 mg total) by mouth daily. 90 capsule 3  . mirabegron ER (MYRBETRIQ) 25 MG TB24 tablet Take 25 mg by mouth daily.    . mirtazapine (REMERON) 15 MG tablet Take 1 tablet (15 mg total) by mouth at bedtime. 90 tablet 1  . Multiple Vitamin (MULTIVITAMIN) tablet Take 1 tablet by mouth daily.    . Omega-3 Fatty Acids (FISH OIL) 1000 MG CAPS Take 1,000 mg by mouth daily.    Marland Kitchen omeprazole (PRILOSEC) 20 MG capsule Take 1 capsule (20 mg total) by mouth daily. 30 days for gastroprotection while taking Aspirin. 30 capsule 0  . polyethylene glycol (MIRALAX / GLYCOLAX) packet Take 17 g by mouth daily as needed for mild constipation. Reported on 10/17/2015    . QUEtiapine (SEROQUEL) 25 MG tablet Take 1 tablet (25 mg total) by mouth at bedtime. 90 tablet 1  . rosuvastatin (CRESTOR) 20 MG tablet Take 1 tablet (20 mg total) by mouth once a week. 15 tablet 3   No current facility-administered medications on file prior to visit.     BP  136/62 (BP Location: Left Arm)   Temp 98.1 F (36.7 C) (Oral)   Wt 104 lb (47.2 kg)   BMI 17.85 kg/m       Objective:   Physical Exam  Constitutional: She is oriented to person, place, and time. She appears well-developed and well-nourished. No distress.  Cardiovascular: Normal rate, regular rhythm, normal heart sounds and intact distal pulses. Exam reveals no gallop  and no friction rub.  No murmur heard. Pulmonary/Chest: Effort normal and breath sounds normal. No respiratory distress. She has no wheezes. She has no rales. She exhibits no tenderness.  Neurological: She is alert and oriented to person, place, and time.  Skin: Skin is warm and dry. No rash noted. She is not diaphoretic. There is erythema. No pallor.  Quarter sized abscess on right side of neck. + purulent drainage, erythema, and pain with palpation   Psychiatric: She has a normal mood and affect. Her behavior is normal. Judgment and thought content normal.  Vitals reviewed.     Assessment & Plan:  1. Cutaneous abscess of neck Procedure:  Incision and drainage of abscess Risks, benefits, and alternatives explained and verbal consent obtained. Time out conducted. Surface cleaned with alcohol and betadine   1.0 cc lidocaine without epinephine infiltrated around abscess. Adequate anesthesia ensured. Area prepped and draped in a sterile fashion. #11 blade used to make a stab incision into abscess. Pus expressed with pressure. Curved hemostat used to explore 4 quadrants and loculations broken up. Further purulence expressed. 0.5  inches of iodoform packing placed leaving a 1-inch tail. Hemostasis achieved. Pt stable. Aftercare and follow-up advised. - doxycycline (VIBRAMYCIN) 100 MG capsule; Take 1 capsule (100 mg total) by mouth 2 (two) times daily.  Dispense: 20 capsule; Refill: 0 - Follow up in 2 days or sooner if needed - WOUND CULTURE  Dorothyann Peng, NP

## 2017-05-26 ENCOUNTER — Ambulatory Visit: Payer: Medicare Other | Admitting: Adult Health

## 2017-05-26 ENCOUNTER — Encounter: Payer: Self-pay | Admitting: Adult Health

## 2017-05-26 VITALS — BP 136/70 | Temp 98.2°F | Ht 64.0 in | Wt 102.3 lb

## 2017-05-26 DIAGNOSIS — Z5189 Encounter for other specified aftercare: Secondary | ICD-10-CM

## 2017-05-26 NOTE — Progress Notes (Signed)
Subjective:    Patient ID: Julia [name], female    DOB: Jun 05, 1934, 82 y.o.   MRN: [id number]  HPI  82 year old female who  has a past medical history of Alzheimer's dementia, Anxiety, Concussion with no loss of consciousness (05/14/2008), Dementia, GERD (gastroesophageal reflux disease), History of colonic polyps, echocardiogram, Hyperlipidemia, Hypertension, Hypothyroidism, Osteopenia, Right forearm fracture (2011), and Thyroid disease.   She presents to the office today for 2-day follow-up status post I&D of cutaneous abscess on the right side of her neck.  Office visit abscess was drained and she was started on a 10-day course of doxycycline.  Today her daughter reports that the wound appears much improved. There was minimal drainage on the bandage today. No fevers at home. No streaking    Review of Systems See HPI   Past Medical History:  Diagnosis Date  . Alzheimer's dementia   . Anxiety   . Concussion with no loss of consciousness 05/14/2008   Qualifier: Diagnosis of  By: Arnoldo Morale MD, Balinda Quails   . Dementia   . GERD (gastroesophageal reflux disease)   . History of colonic polyps   . Hx of echocardiogram    Echo 4/16:  EF 55-60%, mild LVH  . Hyperlipidemia   . Hypertension   . Hypothyroidism   . Osteopenia   . Right forearm fracture 2011  . Thyroid disease    hypothyroidism    Social History   Socioeconomic History  . Marital status: Married    Spouse name: Not on file  . Number of children: Not on file  . Years of education: Not on file  . Highest education level: Not on file  Social Needs  . Financial resource strain: Not on file  . Food insecurity - worry: Not on file  . Food insecurity - inability: Not on file  . Transportation needs - medical: Not on file  . Transportation needs - non-medical: Not on file  Occupational History  . Occupation: retired  Tobacco Use  . Smoking status: Never Smoker  . Smokeless tobacco: Never Used  Substance and Sexual  Activity  . Alcohol use: No    Alcohol/week: 0.0 oz  . Drug use: No  . Sexual activity: No  Other Topics Concern  . Not on file  Social History Narrative   Married to husband Ardyth Gal for 13 years and lives in [place]. 2 daughters live in [place], 1 son in [place] and 1 in [place].       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       Hobbies: tv, caring for cat (very important to her), change is difficult even with type of tv      Jake Samples (daughter) Chauncey Reading [phone]   Full Code    Past Surgical History:  Procedure Laterality Date  . ABDOMINAL HYSTERECTOMY  2009  . BILATERAL SALPINGOOPHORECTOMY    . bladder tack    . CATARACT EXTRACTION Right at least a year ago  . FEMUR IM NAIL Right 03/27/2017   Procedure: INTRAMEDULLARY (IM) NAIL FEMORAL;  Surgeon: Renette Butters, MD;  Location: [facility];  Service: Orthopedics;  Laterality: Right;  . HIP PINNING,CANNULATED Right 11/07/2016   Procedure: RIGHT CANNULATED HIP PINNING;  Surgeon: Renette Butters, MD;  Location: [facility];  Service: Orthopedics;  Laterality: Right;  . INCISION AND DRAINAGE OF WOUND Left 08/04/2015   Procedure: DEBRIDEMENT DISTAL INTERPHALANGEAL LEFT INDEX FINGER;  Surgeon: Daryll Brod, MD;  Location: [facility];  Service: Orthopedics;  Laterality: Left;  Marland Kitchen MASS EXCISION N/A 08/04/2015   Procedure: EXCISION MUCOID CYST;  Surgeon: Daryll Brod, MD;  Location: [facility];  Service: Orthopedics;  Laterality: N/A;  . MASS EXCISION Left 11/24/2015   Procedure: EXCISION RECURRENT  MUCOID CYST LEFT INDEX  FINGER;  Surgeon: Daryll Brod, MD;  Location: [facility];  Service: Orthopedics;  Laterality: Left;  . ROTATOR CUFF REPAIR     right shoulder  . surgical repair to fx left wrist    . TONSILLECTOMY      Family History  Problem Relation Age of Onset  . Hypertension Mother   . COPD Mother   . Tuberculosis Mother        x 2  . Lung cancer Father   . Stomach  cancer Father   . Heart disease Brother   . Drug abuse Maternal Uncle   . Cancer Maternal Uncle   . Alzheimer's disease Maternal Aunt   . Colon cancer Neg Hx   . Esophageal cancer Neg Hx   . Rectal cancer Neg Hx   . Heart attack Neg Hx   . Stroke Neg Hx     Allergies  Allergen Reactions  . Entex Other (See Comments)    unknown  . Sulfonamide Derivatives Other (See Comments)    unknown    Current Outpatient Medications on File Prior to Visit  Medication Sig Dispense Refill  . amLODipine (NORVASC) 5 MG tablet Take 1 tablet (5 mg total) by mouth daily. 90 tablet 3  . aspirin EC 81 MG tablet Take 81 mg by mouth daily.    Marland Kitchen doxycycline (VIBRAMYCIN) 100 MG capsule Take 1 capsule (100 mg total) by mouth 2 (two) times daily. 20 capsule 0  . galantamine (RAZADYNE ER) 24 MG 24 hr capsule TAKE 1 CAPSULE BY MOUTH DAILY WITH BREAKFAST 90 capsule 3  . latanoprost (XALATAN) 0.005 % ophthalmic solution Place 1 drop into both eyes at bedtime.    Marland Kitchen levothyroxine (SYNTHROID, LEVOTHROID) 88 MCG tablet TAKE 1 TABLET BY MOUTH DAILY BEFORE BREAKFAST (Patient taking differently: Take 88 mcg by mouth daily before breakfast. ) 90 tablet 1  . memantine (NAMENDA XR) 28 MG CP24 24 hr capsule Take 1 capsule (28 mg total) by mouth daily. 90 capsule 3  . mirabegron ER (MYRBETRIQ) 25 MG TB24 tablet Take 25 mg by mouth daily.    . mirtazapine (REMERON) 15 MG tablet Take 1 tablet (15 mg total) by mouth at bedtime. 90 tablet 1  . Multiple Vitamin (MULTIVITAMIN) tablet Take 1 tablet by mouth daily.    . Omega-3 Fatty Acids (FISH OIL) 1000 MG CAPS Take 1,000 mg by mouth daily.    Marland Kitchen omeprazole (PRILOSEC) 20 MG capsule Take 1 capsule (20 mg total) by mouth daily. 30 days for gastroprotection while taking Aspirin. 30 capsule 0  . polyethylene glycol (MIRALAX / GLYCOLAX) packet Take 17 g by mouth daily as needed for mild constipation. Reported on 10/17/2015    . QUEtiapine (SEROQUEL) 25 MG tablet Take 1 tablet (25 mg total)  by mouth at bedtime. 90 tablet 1  . rosuvastatin (CRESTOR) 20 MG tablet Take 1 tablet (20 mg total) by mouth once a week. 15 tablet 3   No current facility-administered medications on file prior to visit.     There were no vitals taken for this visit.      Objective:   Physical Exam  Constitutional: She is oriented to person, place, and  time. She appears well-developed and well-nourished. No distress.  Neurological: She is alert and oriented to person, place, and time.  Skin: Skin is warm. She is not diaphoretic.  Wound appears to be healing well. Minimal drainage on bandage. Wound continues to open.   Psychiatric: She has a normal mood and affect. Her behavior is normal. Thought content normal.  Nursing note and vitals reviewed.     Assessment & Plan:  1. Wound check, abscess - Continue with abx therapy  - Can apply neosporin to bandage  - Follow up with any signs of infection   Dorothyann Peng, NP

## 2017-05-27 LAB — WOUND CULTURE
MICRO NUMBER: 81448456
RESULT: NO GROWTH
SPECIMEN QUALITY:: ADEQUATE

## 2017-06-15 ENCOUNTER — Encounter: Payer: Self-pay | Admitting: Neurology

## 2017-06-15 ENCOUNTER — Ambulatory Visit: Payer: Medicare Other | Admitting: Neurology

## 2017-06-15 VITALS — BP 140/66 | HR 61 | Ht 63.5 in | Wt 105.2 lb

## 2017-06-15 DIAGNOSIS — G309 Alzheimer's disease, unspecified: Secondary | ICD-10-CM | POA: Diagnosis not present

## 2017-06-15 DIAGNOSIS — F0281 Dementia in other diseases classified elsewhere with behavioral disturbance: Secondary | ICD-10-CM

## 2017-06-15 NOTE — Patient Instructions (Addendum)
1.  Continue galantamine ER 24mg  daily, memantine XR 28mg  daily, mirtazapine 15mg  at bedtime and quetiapine 25mg  at bedtime 2.  I think physical therapy would be good. 3.  Get life alert  4.  Follow up in one year

## 2017-06-15 NOTE — Progress Notes (Signed)
NEUROLOGY FOLLOW UP OFFICE NOTE  Julia [name] [id number]  HISTORY OF PRESENT ILLNESS: Julia [name] is an 83 year old woman with HTN, hyperlipidemia, anxiety and thyroid disease, who returns for Alzheimer's dementia.  She is accompanied by her daughters, who supplement history.   UPDATE: Ms. Wierenga has not been seen since 2016.  She currently takes galantamine ER 24mg  daily and memantine XR 28mg  daily.  She takes Seroquel 25mg  at bedtime for agitation.  She is taking mirtazapine 15mg  for depression, suppressed appetite and to help sleep.  She fell and fractured her left hip twice requiring surgeries.  She is back at home living with her husband.  She is able to perform most house chores, such as the laundry.  Her daughter gets the groceries and finances.  She is ambulating independently but is supposed to be using a walker.  She grazes when she eats and has lost weight over the past year,about 10 lbs.  She sleeps well.  She still has anxiety but mood is improved.   HISTORY: Onset of symptoms began 7 years ago, starting with misplacing things such as her keys, and would often repeat questions. She is particularly having short-term memory problems. She frequently repeats questions and forgets why she walked into a room.  She does not forget faces.  She still pays the bills, but with the guidance of her daughter, because she sometimes forgets about some bills.  She has a long-standing history of anxiety, which got worse after the passing of her brother and later one of her daughters.  She occasionally is confused but doesn't really become disoriented when driving. She rarely drives and only during the day to the hair salon or occasionally [facility]. She is able to perform all her ADLs, such as bathing, dressing and feeding herself.  Her husband is disabled and she takes care of most of the house chores.  She does the laundry, washes the dishes and cleans the house.     MRI of the brain from  12/04/12 was reviewed and revealed mild to moderate atrophy with moderately extensive small vessel ischemic changes and small chronic lacunar infarct.   Her daughter is healthcare and financial POA.  PAST MEDICAL HISTORY: Past Medical History:  Diagnosis Date  . Alzheimer's dementia   . Anxiety   . Concussion with no loss of consciousness 05/14/2008   Qualifier: Diagnosis of  By: Arnoldo Morale MD, Balinda Quails   . Dementia   . GERD (gastroesophageal reflux disease)   . History of colonic polyps   . Hx of echocardiogram    Echo 4/16:  EF 55-60%, mild LVH  . Hyperlipidemia   . Hypertension   . Hypothyroidism   . Osteopenia   . Right forearm fracture 2011  . Thyroid disease    hypothyroidism    MEDICATIONS: Current Outpatient Medications on File Prior to Visit  Medication Sig Dispense Refill  . amLODipine (NORVASC) 5 MG tablet Take 1 tablet (5 mg total) by mouth daily. 90 tablet 3  . aspirin EC 81 MG tablet Take 81 mg by mouth daily.    Marland Kitchen galantamine (RAZADYNE ER) 24 MG 24 hr capsule TAKE 1 CAPSULE BY MOUTH DAILY WITH BREAKFAST 90 capsule 3  . latanoprost (XALATAN) 0.005 % ophthalmic solution Place 1 drop into both eyes at bedtime.    Marland Kitchen levothyroxine (SYNTHROID, LEVOTHROID) 88 MCG tablet TAKE 1 TABLET BY MOUTH DAILY BEFORE BREAKFAST (Patient taking differently: Take 88 mcg by mouth daily before breakfast. ) 90 tablet  1  . memantine (NAMENDA XR) 28 MG CP24 24 hr capsule Take 1 capsule (28 mg total) by mouth daily. 90 capsule 3  . mirabegron ER (MYRBETRIQ) 25 MG TB24 tablet Take 25 mg by mouth daily.    . mirtazapine (REMERON) 15 MG tablet Take 1 tablet (15 mg total) by mouth at bedtime. 90 tablet 1  . Multiple Vitamin (MULTIVITAMIN) tablet Take 1 tablet by mouth daily.    . Omega-3 Fatty Acids (FISH OIL) 1000 MG CAPS Take 1,000 mg by mouth daily.    Marland Kitchen omeprazole (PRILOSEC) 20 MG capsule Take 1 capsule (20 mg total) by mouth daily. 30 days for gastroprotection while taking Aspirin. 30 capsule 0    . polyethylene glycol (MIRALAX / GLYCOLAX) packet Take 17 g by mouth daily as needed for mild constipation. Reported on 10/17/2015    . QUEtiapine (SEROQUEL) 25 MG tablet Take 1 tablet (25 mg total) by mouth at bedtime. 90 tablet 1  . rosuvastatin (CRESTOR) 20 MG tablet Take 1 tablet (20 mg total) by mouth once a week. 15 tablet 3   No current facility-administered medications on file prior to visit.     ALLERGIES: Allergies  Allergen Reactions  . Entex Other (See Comments)    unknown  . Sulfonamide Derivatives Other (See Comments)    unknown    FAMILY HISTORY: Family History  Problem Relation Age of Onset  . Hypertension Mother   . COPD Mother   . Tuberculosis Mother        x 2  . Lung cancer Father   . Stomach cancer Father   . Heart disease Brother   . Drug abuse Maternal Uncle   . Cancer Maternal Uncle   . Alzheimer's disease Maternal Aunt   . Colon cancer Neg Hx   . Esophageal cancer Neg Hx   . Rectal cancer Neg Hx   . Heart attack Neg Hx   . Stroke Neg Hx     SOCIAL HISTORY: Social History   Socioeconomic History  . Marital status: Married    Spouse name: Not on file  . Number of children: Not on file  . Years of education: Not on file  . Highest education level: Not on file  Social Needs  . Financial resource strain: Not on file  . Food insecurity - worry: Not on file  . Food insecurity - inability: Not on file  . Transportation needs - medical: Not on file  . Transportation needs - non-medical: Not on file  Occupational History  . Occupation: retired  Tobacco Use  . Smoking status: Never Smoker  . Smokeless tobacco: Never Used  Substance and Sexual Activity  . Alcohol use: No    Alcohol/week: 0.0 oz  . Drug use: No  . Sexual activity: No  Other Topics Concern  . Not on file  Social History Narrative   Married to husband Ardyth Gal for 73 years and lives in [place]. 2 daughters live in [place], 1 son in [place] and 1 in [place].        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       Hobbies: tv, caring for cat (very important to her), change is difficult even with type of tv      Jake Samples (daughter) Chauncey Reading [phone]   Full Code    REVIEW OF SYSTEMS: Constitutional: No fevers, chills, or sweats, no generalized fatigue, change in appetite Eyes: No visual changes, double vision, eye pain Ear, nose and throat:  No hearing loss, ear pain, nasal congestion, sore throat Cardiovascular: No chest pain, palpitations Respiratory:  No shortness of breath at rest or with exertion, wheezes GastrointestinaI: No nausea, vomiting, diarrhea, abdominal pain, fecal incontinence Genitourinary:  No dysuria, urinary retention or frequency Musculoskeletal:  No neck pain, back pain Integumentary: No rash, pruritus, skin lesions Neurological: as above Psychiatric: No depression, insomnia, anxiety Endocrine: No palpitations, fatigue, diaphoresis, mood swings, change in appetite, change in weight, increased thirst Hematologic/Lymphatic:  No purpura, petechiae. Allergic/Immunologic: no itchy/runny eyes, nasal congestion, recent allergic reactions, rashes  PHYSICAL EXAM: Vitals:   06/15/17 1447  BP: 140/66  Pulse: 61  SpO2: 100%   General: No acute distress.  Patient appears well-groomed.   Head:  Normocephalic/atraumatic Eyes:  Fundi examined but not visualized Neck: supple, no paraspinal tenderness, full range of motion Heart:  Regular rate and rhythm Lungs:  Clear to auscultation bilaterally Back: No paraspinal tenderness Neurological Exam: alert and oriented to person, place, and year. Attention span and concentration intact, delayed recall poor, remote memory intact, fund of knowledge intact.  Speech fluent and not dysarthric, language intact.  MMSE - Mini Mental State Exam 06/15/2017 07/28/2016  Orientation to time 2 1  Orientation to Place 4 5  Registration 3 3  Attention/ Calculation 5 5    Attention/Calculation-comments - serial 3's from 10  Recall 0 0  Language- name 2 objects 2 2  Language- repeat 1 1  Language- follow 3 step command 3 3  Language- read & follow direction 1 1  Write a sentence 1 1  Copy design 0 0  Total score 22 22   CN II-XII intact. Bulk and tone normal, muscle strength 5/5 throughout.  Sensation to light touch  intact.  Deep tendon reflexes 2+ throughout.  Finger to nose testing intact.  Gait with reduced stride, Romberg negative.  IMPRESSION: Alzheimer's disease  PLAN: 1.  We won't make any changes to medication:  Galantamine ER 24mg  daily, memantine ER 28mg  daily, mirtazapine 15mg  at bedtime and Seroquel 25mg  at bedtime. 2.  Agree with getting a life alert bracelet 3.  Follow up in one year or as needed.  25 minutes spent face to face with patient, over 50% spent discussing management.  Metta Clines, DO  CC:  Dorothyann Peng, NP

## 2017-08-30 ENCOUNTER — Other Ambulatory Visit: Payer: Self-pay | Admitting: Adult Health

## 2017-08-31 NOTE — Telephone Encounter (Signed)
Sent to the pharmacy by e-scribe. 

## 2017-08-31 NOTE — Telephone Encounter (Signed)
Ok to refill all for 6 months

## 2017-09-22 ENCOUNTER — Encounter: Payer: Self-pay | Admitting: Neurology

## 2017-10-12 ENCOUNTER — Telehealth: Payer: Self-pay | Admitting: Adult Health

## 2017-10-12 DIAGNOSIS — S72001A Fracture of unspecified part of neck of right femur, initial encounter for closed fracture: Secondary | ICD-10-CM

## 2017-10-12 DIAGNOSIS — E2839 Other primary ovarian failure: Secondary | ICD-10-CM

## 2017-10-12 NOTE — Telephone Encounter (Signed)
Copied from [place] [phone]. Topic: Quick Communication - See Telephone Encounter >> Oct 12, 2017  9:16 AM Ivar Drape wrote: CRM for notification. See Telephone encounter for: 10/12/17. Julia [name] w/[facility] [phone] would like a call back concerning patient's fracture last year.  She said it's urgent that she speaks to someone today.

## 2017-10-12 NOTE — Telephone Encounter (Signed)
Spoke to [place].  Insurance needs a dexa scan to close out her case from hip fracture.  Scan will be free to the pt.  She is able to have dexa scan once yearly.  Lonna informed me that she needs dexa scan done by the end of the day tomorrow.  Jeanie Sewer that scheduling that soon will not be possible..  Order placed for dexa.

## 2017-10-17 NOTE — Telephone Encounter (Signed)
Caller transfer to [facility] for further assistant

## 2018-02-08 ENCOUNTER — Other Ambulatory Visit: Payer: Self-pay | Admitting: Adult Health

## 2018-02-09 NOTE — Telephone Encounter (Signed)
Should [facility] be filling memory medications?

## 2018-02-19 ENCOUNTER — Encounter (HOSPITAL_COMMUNITY): Payer: Self-pay | Admitting: Emergency Medicine

## 2018-02-19 ENCOUNTER — Emergency Department (HOSPITAL_COMMUNITY): Payer: Medicare Other

## 2018-02-19 ENCOUNTER — Emergency Department (HOSPITAL_COMMUNITY)
Admission: EM | Admit: 2018-02-19 | Discharge: 2018-02-20 | Disposition: A | Discharge status: Home / Self Care | Payer: Medicare Other | Attending: Emergency Medicine | Admitting: Emergency Medicine

## 2018-02-19 DIAGNOSIS — G309 Alzheimer's disease, unspecified: Secondary | ICD-10-CM | POA: Insufficient documentation

## 2018-02-19 DIAGNOSIS — R296 Repeated falls: Secondary | ICD-10-CM

## 2018-02-19 DIAGNOSIS — Y92009 Unspecified place in unspecified non-institutional (private) residence as the place of occurrence of the external cause: Secondary | ICD-10-CM | POA: Diagnosis not present

## 2018-02-19 DIAGNOSIS — W1830XA Fall on same level, unspecified, initial encounter: Secondary | ICD-10-CM | POA: Insufficient documentation

## 2018-02-19 DIAGNOSIS — Z23 Encounter for immunization: Secondary | ICD-10-CM | POA: Insufficient documentation

## 2018-02-19 DIAGNOSIS — Y999 Unspecified external cause status: Secondary | ICD-10-CM | POA: Insufficient documentation

## 2018-02-19 DIAGNOSIS — E039 Hypothyroidism, unspecified: Secondary | ICD-10-CM | POA: Diagnosis not present

## 2018-02-19 DIAGNOSIS — Z79899 Other long term (current) drug therapy: Secondary | ICD-10-CM | POA: Diagnosis not present

## 2018-02-19 DIAGNOSIS — Y939 Activity, unspecified: Secondary | ICD-10-CM | POA: Diagnosis not present

## 2018-02-19 DIAGNOSIS — I10 Essential (primary) hypertension: Secondary | ICD-10-CM | POA: Diagnosis not present

## 2018-02-19 DIAGNOSIS — S0083XA Contusion of other part of head, initial encounter: Secondary | ICD-10-CM | POA: Insufficient documentation

## 2018-02-19 LAB — CBC WITH DIFFERENTIAL/PLATELET
Basophils Absolute: 0 10*3/uL (ref 0.0–0.1)
Basophils Relative: 0 %
Eosinophils Absolute: 0.1 10*3/uL (ref 0.0–0.7)
Eosinophils Relative: 1 %
HEMATOCRIT: 45.3 % (ref 36.0–46.0)
HEMOGLOBIN: 14.5 g/dL (ref 12.0–15.0)
LYMPHS ABS: 1.4 10*3/uL (ref 0.7–4.0)
Lymphocytes Relative: 20 %
MCH: 29.2 pg (ref 26.0–34.0)
MCHC: 32 g/dL (ref 30.0–36.0)
MCV: 91.1 fL (ref 78.0–100.0)
MONO ABS: 0.5 10*3/uL (ref 0.1–1.0)
MONOS PCT: 8 %
NEUTROS ABS: 4.8 10*3/uL (ref 1.7–7.7)
NEUTROS PCT: 71 %
Platelets: 215 10*3/uL (ref 150–400)
RBC: 4.97 MIL/uL (ref 3.87–5.11)
RDW: 14.1 % (ref 11.5–15.5)
WBC: 6.8 10*3/uL (ref 4.0–10.5)

## 2018-02-19 LAB — BASIC METABOLIC PANEL
Anion gap: 11 (ref 5–15)
BUN: 22 mg/dL (ref 8–23)
CHLORIDE: 103 mmol/L (ref 98–111)
CO2: 27 mmol/L (ref 22–32)
CREATININE: 1.14 mg/dL — AB (ref 0.44–1.00)
Calcium: 9.9 mg/dL (ref 8.9–10.3)
GFR calc Af Amer: 51 mL/min — ABNORMAL LOW (ref >60)
GFR calc non Af Amer: 44 mL/min — ABNORMAL LOW (ref >60)
GLUCOSE: 115 mg/dL — AB (ref 70–99)
Potassium: 3.8 mmol/L (ref 3.5–5.1)
Sodium: 141 mmol/L (ref 135–145)

## 2018-02-19 LAB — I-STAT TROPONIN, ED: Troponin i, poc: 0.03 ng/mL (ref 0.00–0.08)

## 2018-02-19 MED ORDER — TETANUS-DIPHTH-ACELL PERTUSSIS 5-2.5-18.5 LF-MCG/0.5 IM SUSP
0.5000 mL | Freq: Once | INTRAMUSCULAR | Status: AC
  Administered 2018-02-19: 0.5 mL via INTRAMUSCULAR
  Filled 2018-02-19: qty 0.5

## 2018-02-19 NOTE — ED Provider Notes (Signed)
H/o dementia Unwitnessed fall today 4:30 Per daughter, pt found sitting on front step of her house, contusion to face and wrist Imaging negative She has no memory of fall or crawling back to house ?syncope vs fall head injury  Syncope work up started, pending 2016 cardiac eval for symptomatic bradycardia, ECHO 55-60% EF Anticipate admission given age, memory loss of event  Labs are reassuring, EKG sinus rhythm. No mental status changes during ED encounter. On recheck, patient and daughter state they would prefer discharge home. They will follow up with her doctor for any further outpatient evaluation required.   Patient discharged home in the care of daughter.      Charlann Lange, PA-C 02/20/18 1595    Lacretia Leigh, MD 02/20/18 [phone]

## 2018-02-19 NOTE — ED Provider Notes (Signed)
[facility] DEPT Provider Note   CSN: [id number] Arrival date & time: 02/19/18  1845     History   Chief Complaint Chief Complaint  Patient presents with  . Fall  . hematoma    HPI Julia [name] is a 83 y.o. female with history of Alzheimer's dementia is brought to the ER by daughter for evaluation of injuries sustained after unwitnessed fall.  Daughter provides corroborating history.  Daughter got a phone call at around 4:30 PM from her father.  Reportedly, patient had gone out to the shed approx 20 feet away from her house, husband got worried because she had not been back for a while so he went to look for her and found her sitting on the steps of her house.  It appears patient may have had a fall and crawled back to the steps of her house.  She has a right sided facial bruising and swelling, abrasion to the right forearm and mild stiffness to the left shoulder.  Patient adamantly states she did not fall.  She remembers going out to the shop to clean up but does not know what happened after that.  She denies any pain.  She denies headache, vision changes, neck pain, chest pain, shortness of breath, back pain, abdominal pain, loss of sensation or weakness to her extremities.  No anticoagulants.  Level 5 caveat secondary to baseline dementia.  Per daughter, patient appears to be behaving at her baseline. HPI  Past Medical History:  Diagnosis Date  . Alzheimer's dementia   . Anxiety   . Concussion with no loss of consciousness 05/14/2008   Qualifier: Diagnosis of  By: Arnoldo Morale MD, Balinda Quails   . Dementia   . GERD (gastroesophageal reflux disease)   . History of colonic polyps   . Hx of echocardiogram    Echo 4/16:  EF 55-60%, mild LVH  . Hyperlipidemia   . Hypertension   . Hypothyroidism   . Osteopenia   . Right forearm fracture 2011  . Thyroid disease    hypothyroidism    Patient Active Problem List   Diagnosis Date Noted  . Closed  intertrochanteric fracture of hip, right, initial encounter ([place]) 03/27/2017  . Mild dementia 03/27/2017  . Leukocytosis 03/27/2017  . Closed fracture of right hip ([place])   . Closed nondisplaced fracture of base of neck of right femur ([place]) 11/07/2016  . Sinus bradycardia 09/05/2014  . Junctional escape rhythm 09/05/2014  . Alzheimer's dementia 04/21/2014  . Overactive bladder 04/21/2014  . Hypothyroidism, acquired 11/21/2013  . PALPITATIONS, RECURRENT 12/09/2008  . Hyperlipidemia 04/18/2007  . Essential hypertension 12/11/2006  . GERD 12/11/2006  . Osteopenia 12/11/2006  . History of colonic polyps 12/11/2006    Past Surgical History:  Procedure Laterality Date  . ABDOMINAL HYSTERECTOMY  2009  . BILATERAL SALPINGOOPHORECTOMY    . bladder tack    . CATARACT EXTRACTION Right at least a year ago  . FEMUR IM NAIL Right 03/27/2017   Procedure: INTRAMEDULLARY (IM) NAIL FEMORAL;  Surgeon: Renette Butters, MD;  Location: [facility];  Service: Orthopedics;  Laterality: Right;  . HIP PINNING,CANNULATED Right 11/07/2016   Procedure: RIGHT CANNULATED HIP PINNING;  Surgeon: Renette Butters, MD;  Location: [facility];  Service: Orthopedics;  Laterality: Right;  . INCISION AND DRAINAGE OF WOUND Left 08/04/2015   Procedure: DEBRIDEMENT DISTAL INTERPHALANGEAL LEFT INDEX FINGER;  Surgeon: Daryll Brod, MD;  Location: [facility];  Service: Orthopedics;  Laterality: Left;  .  MASS EXCISION N/A 08/04/2015   Procedure: EXCISION MUCOID CYST;  Surgeon: Daryll Brod, MD;  Location: [facility];  Service: Orthopedics;  Laterality: N/A;  . MASS EXCISION Left 11/24/2015   Procedure: EXCISION RECURRENT  MUCOID CYST LEFT INDEX  FINGER;  Surgeon: Daryll Brod, MD;  Location: [facility];  Service: Orthopedics;  Laterality: Left;  . ROTATOR CUFF REPAIR     right shoulder  . surgical repair to fx left wrist    . TONSILLECTOMY       OB History   None      Home Medications     Prior to Admission medications   Medication Sig Start Date End Date Taking? Authorizing Provider  amLODipine (NORVASC) 5 MG tablet TAKE 1 TABLET BY MOUTH DAILY Patient taking differently: Take 5 mg by mouth daily.  02/09/18  Yes Nafziger, Tommi Rumps, NP  aspirin EC 81 MG tablet Take 81 mg by mouth daily.   Yes [provider]  Besifloxacin HCl (BESIVANCE) 0.6 % SUSP Place 1 drop into both eyes at bedtime. 09/07/15  Yes [provider]  galantamine (RAZADYNE ER) 24 MG 24 hr capsule TAKE ONE CAPSULE BY MOUTH EVERY MORNING WITH BREAKFAST 02/09/18  Yes Nafziger, Tommi Rumps, NP  ibuprofen (ADVIL,MOTRIN) 200 MG tablet Take 200 mg by mouth daily.   Yes [provider]  latanoprost (XALATAN) 0.005 % ophthalmic solution Place 1 drop into both eyes at bedtime.   Yes [provider]  levothyroxine (SYNTHROID, LEVOTHROID) 88 MCG tablet TAKE 1 TABLET BY MOUTH DAILY BEFORE BREAKFAST Patient taking differently: Take 88 mcg by mouth daily before breakfast. TAKE 1 TABLET BY MOUTH DAILY BEFORE BREAKFAST 08/31/17  Yes Nafziger, Tommi Rumps, NP  memantine (NAMENDA XR) 28 MG CP24 24 hr capsule TAKE ONE CAPSULE BY MOUTH DAILY 02/09/18  Yes Nafziger, Tommi Rumps, NP  mirtazapine (REMERON) 15 MG tablet TAKE 1 TABLET BY MOUTH AT BEDTIME 08/31/17  Yes Nafziger, Tommi Rumps, NP  Multiple Vitamin (MULTIVITAMIN) tablet Take 1 tablet by mouth daily.   Yes [provider]  Multiple Vitamins-Minerals (CENTRUM SILVER PO) Take 1 tablet by mouth daily.   Yes [provider]  MYRBETRIQ 50 MG TB24 tablet Take 50 mg by mouth daily. 01/22/18  Yes [provider]  Omega-3 Fatty Acids (FISH OIL) 1000 MG CAPS Take 1,000 mg by mouth daily.   Yes [provider]  polyethylene glycol (MIRALAX / GLYCOLAX) packet Take 17 g by mouth daily as needed for mild constipation. Reported on 10/17/2015   Yes [provider]  QUEtiapine (SEROQUEL) 25 MG tablet TAKE 1 TABLET BY MOUTH AT BEDTIME 08/31/17  Yes  Nafziger, Tommi Rumps, NP  rosuvastatin (CRESTOR) 20 MG tablet Take 1 tablet (20 mg total) by mouth once a week. 09/12/14  Yes Marin Olp, MD  omeprazole (PRILOSEC) 20 MG capsule Take 1 capsule (20 mg total) by mouth daily. 30 days for gastroprotection while taking Aspirin. Patient not taking: Reported on 02/19/2018 03/28/17   Prudencio Burly III, PA-C    Family History Family History  Problem Relation Age of Onset  . Hypertension Mother   . COPD Mother   . Tuberculosis Mother        x 2  . Lung cancer Father   . Stomach cancer Father   . Heart disease Brother   . Drug abuse Maternal Uncle   . Cancer Maternal Uncle   . Alzheimer's disease Maternal Aunt   . Colon cancer Neg Hx   . Esophageal cancer  Neg Hx   . Rectal cancer Neg Hx   . Heart attack Neg Hx   . Stroke Neg Hx     Social History Social History   Tobacco Use  . Smoking status: Never Smoker  . Smokeless tobacco: Never Used  Substance Use Topics  . Alcohol use: No    Alcohol/week: 0.0 standard drinks  . Drug use: No     Allergies   Entex and Sulfonamide derivatives   Review of Systems Review of Systems  Unable to perform ROS: Dementia  HENT: Positive for facial swelling.   Skin: Positive for wound.  All other systems reviewed and are negative.    Physical Exam Updated Vital Signs BP (!) 179/56 (BP Location: Right Arm)   Pulse 60   Temp 97.8 F (36.6 C) (Oral)   Resp 16   Ht 5\' 6"  (1.676 m)   Wt 47.6 kg   SpO2 100%   BMI 16.95 kg/m   Physical Exam  Constitutional: She is oriented to person, place, and time. She appears well-developed and well-nourished. She is cooperative. She is easily aroused. No distress.  HENT:  Head: Head is with abrasion and with contusion.  Moderate edema, ecchymosis, tenderness to right lateral eyebrow and lateral cheekbone.   No tenderness to nasal or scalp bones.   No Raccoon's eyes. No Battle's sign. No hemotympanum or otorrhea, bilaterally. No epistaxis  or rhinorrhea, septum midline.  No intraoral bleeding or injury. No malocclusion.   Eyes: Conjunctivae are normal.  Lids normal. EOMs and PERRL intact.   Neck:  C-spine: no midline or paraspinal muscular tenderness. Full active ROM of cervical spine w/o pain. Trachea midline  Cardiovascular: Normal rate, regular rhythm, S1 normal, S2 normal and normal heart sounds. Exam reveals no distant heart sounds.  Pulses:      Carotid pulses are 2+ on the right side, and 2+ on the left side.      Radial pulses are 2+ on the right side, and 2+ on the left side.       Dorsalis pedis pulses are 2+ on the right side, and 2+ on the left side.  2+ radial and DP pulses bilaterally  Pulmonary/Chest: Effort normal and breath sounds normal. She has no decreased breath sounds.  No anterior/posterior thorax tenderness. Equal and symmetric chest wall expansion   Abdominal: Soft. Bowel sounds are normal. There is no tenderness.  Musculoskeletal: Normal range of motion. She exhibits no deformity.  Full passive ROM of upper and lower extremities without pain. Ambulatory without assistance to and from bed.  T-spine: no paraspinal muscular tenderness or midline tenderness.   L-spine: no paraspinal muscular or midline tenderness.  Pelvis: no instability with AP/L compression, leg shortening or rotation. Full PROM of hips bilaterally without pain.  Left shoulder/arm: decreased abduction of left shoulder, painless. Full passive ROM of shoulder, painless.   Neurological: She is alert, oriented to person, place, and time and easily aroused.  Alert and oriented to self, place, time.  Speech is fluent without obvious dysarthria or dysphasia. Strength 5/5 with hand grip and ankle F/E.   Sensation to light touch intact in hands and feet. Normal gait. No pronator drift. No leg drop.  Normal finger-to-nose and finger tapping.  Normal heel-to-shin. CN I and VIII not tested. CN II-XII grossly intact bilaterally.   Skin: Skin is  warm and dry. Capillary refill takes less than 2 seconds.  Small irregular abrasion to right dorsal eye brow, hemostatic, mild surrounding ecchymosis  Psychiatric:  Her behavior is normal. Thought content normal.     ED Treatments / Results  Labs (all labs ordered are listed, but only abnormal results are displayed) Labs Reviewed  CBC WITH DIFFERENTIAL/PLATELET  BASIC METABOLIC PANEL  URINALYSIS, ROUTINE W REFLEX MICROSCOPIC  I-STAT TROPONIN, ED    EKG None  Radiology Dg Ribs Unilateral W/chest Left  Result Date: 02/19/2018 CLINICAL DATA:  Unwitnessed fall earlier today with left shoulder pain posteriorly. EXAM: LEFT RIBS AND CHEST - 3+ VIEW COMPARISON:  Chest x-ray 11/07/2016 FINDINGS: Lungs are adequately inflated and otherwise clear. Cardiomediastinal silhouette is within normal. Mild degenerate change of the shoulders. No evidence of acute fracture. IMPRESSION: No acute findings. Electronically Signed   By: Marin Olp M.D.   On: 02/19/2018 21:48   Dg Scapula Left  Result Date: 02/19/2018 CLINICAL DATA:  Unwitnessed fall earlier today with left shoulder/scapular pain. EXAM: LEFT SCAPULA - 2+ VIEWS COMPARISON:  None. FINDINGS: Mild degenerative change of the glenohumeral joint and AC joints. No evidence of acute fracture or dislocation. IMPRESSION: No acute findings. Electronically Signed   By: Marin Olp M.D.   On: 02/19/2018 21:45   Ct Head Wo Contrast  Result Date: 02/19/2018 CLINICAL DATA:  Unwitnessed fall today. RIGHT periorbital bruising. History of dementia, hypertension and hyperlipidemia. EXAM: CT HEAD WITHOUT CONTRAST CT MAXILLOFACIAL WITHOUT CONTRAST CT CERVICAL SPINE WITHOUT CONTRAST TECHNIQUE: Multidetector CT imaging of the head, cervical spine, and maxillofacial structures were performed using the standard protocol without intravenous contrast. Multiplanar CT image reconstructions of the cervical spine and maxillofacial structures were also generated. COMPARISON:   MRI head October 28, 2016 FINDINGS: CT HEAD FINDINGS BRAIN: Stable moderate to severe parenchymal brain volume loss. No hydrocephalus. No intraparenchymal hemorrhage, mass effect nor midline shift. Patchy supratentorial white matter hypodensities. No acute large vascular territory infarcts. No abnormal extra-axial fluid collections. Basal cisterns are patent. VASCULAR: Trace calcific atherosclerosis. Dolichoectatic intracranial vessels stable from prior imaging seen with chronic hypertension. SKULL/SOFT TISSUES: No skull fracture. No significant soft tissue swelling. OTHER: None. CT MAXILLOFACIAL FINDINGS OSSEOUS: No acute facial fracture. The mandible is intact, the condyles are located. No destructive bony lesions. ORBITS: Ocular globes and orbital contents are nonacute. Status post RIGHT ocular lens implant. SINUSES: Lobulated RIGHT ethmoid mucosal thickening. Intact nasal septum is midline. Mastoid aircells are well aerated. SOFT TISSUES: RIGHT periorbital soft tissue swelling sting to premalar soft tissues with subcutaneous fat stranding. No subcutaneous gas or radiopaque foreign bodies. CT CERVICAL SPINE FINDINGS ALIGNMENT: Maintenance of cervical lordosis. Minimal grade 1 C7-T1 anterolisthesis. Mild cervicothoracic levoscoliosis. SKULL BASE AND VERTEBRAE: Cervical vertebral bodies and posterior elements are intact. Severe C5-6 and C6-7 disc height loss with endplate spurring compatible with degenerative discs, moderate at C4-5 and C7-T1. No destructive bony lesions. C1-2 articulation maintained. SOFT TISSUES AND SPINAL CANAL: Nonacute. Mild calcific atherosclerosis carotid bifurcations. Status post thyroidectomy. DISC LEVELS: No significant osseous canal stenosis. Severe LEFT C3-4, LEFT C4-5 neural foraminal narrowing. UPPER CHEST: Lung apices are clear. OTHER: None. IMPRESSION: CT HEAD: 1. No acute intracranial process. 2. Stable moderate to severe parenchymal brain volume loss. 3. Stable moderate to severe  chronic small vessel ischemic changes. CT MAXILLOFACIAL: 1. RIGHT periorbital soft tissue swelling/hematoma, no postseptal extension. 2. No facial fracture. CT CERVICAL SPINE: 1. No acute fracture. Grade 1 C7-T1 anterolisthesis on a degenerative basis. 2. Severe LEFT C3-4 and C4-5 neural foraminal narrowing. Electronically Signed   By: Elon Alas M.D.   On: 02/19/2018 21:55   Ct Cervical Spine Wo  Contrast  Result Date: 02/19/2018 CLINICAL DATA:  Unwitnessed fall today. RIGHT periorbital bruising. History of dementia, hypertension and hyperlipidemia. EXAM: CT HEAD WITHOUT CONTRAST CT MAXILLOFACIAL WITHOUT CONTRAST CT CERVICAL SPINE WITHOUT CONTRAST TECHNIQUE: Multidetector CT imaging of the head, cervical spine, and maxillofacial structures were performed using the standard protocol without intravenous contrast. Multiplanar CT image reconstructions of the cervical spine and maxillofacial structures were also generated. COMPARISON:  MRI head October 28, 2016 FINDINGS: CT HEAD FINDINGS BRAIN: Stable moderate to severe parenchymal brain volume loss. No hydrocephalus. No intraparenchymal hemorrhage, mass effect nor midline shift. Patchy supratentorial white matter hypodensities. No acute large vascular territory infarcts. No abnormal extra-axial fluid collections. Basal cisterns are patent. VASCULAR: Trace calcific atherosclerosis. Dolichoectatic intracranial vessels stable from prior imaging seen with chronic hypertension. SKULL/SOFT TISSUES: No skull fracture. No significant soft tissue swelling. OTHER: None. CT MAXILLOFACIAL FINDINGS OSSEOUS: No acute facial fracture. The mandible is intact, the condyles are located. No destructive bony lesions. ORBITS: Ocular globes and orbital contents are nonacute. Status post RIGHT ocular lens implant. SINUSES: Lobulated RIGHT ethmoid mucosal thickening. Intact nasal septum is midline. Mastoid aircells are well aerated. SOFT TISSUES: RIGHT periorbital soft tissue swelling  sting to premalar soft tissues with subcutaneous fat stranding. No subcutaneous gas or radiopaque foreign bodies. CT CERVICAL SPINE FINDINGS ALIGNMENT: Maintenance of cervical lordosis. Minimal grade 1 C7-T1 anterolisthesis. Mild cervicothoracic levoscoliosis. SKULL BASE AND VERTEBRAE: Cervical vertebral bodies and posterior elements are intact. Severe C5-6 and C6-7 disc height loss with endplate spurring compatible with degenerative discs, moderate at C4-5 and C7-T1. No destructive bony lesions. C1-2 articulation maintained. SOFT TISSUES AND SPINAL CANAL: Nonacute. Mild calcific atherosclerosis carotid bifurcations. Status post thyroidectomy. DISC LEVELS: No significant osseous canal stenosis. Severe LEFT C3-4, LEFT C4-5 neural foraminal narrowing. UPPER CHEST: Lung apices are clear. OTHER: None. IMPRESSION: CT HEAD: 1. No acute intracranial process. 2. Stable moderate to severe parenchymal brain volume loss. 3. Stable moderate to severe chronic small vessel ischemic changes. CT MAXILLOFACIAL: 1. RIGHT periorbital soft tissue swelling/hematoma, no postseptal extension. 2. No facial fracture. CT CERVICAL SPINE: 1. No acute fracture. Grade 1 C7-T1 anterolisthesis on a degenerative basis. 2. Severe LEFT C3-4 and C4-5 neural foraminal narrowing. Electronically Signed   By: Elon Alas M.D.   On: 02/19/2018 21:55   Dg Shoulder Left  Result Date: 02/19/2018 CLINICAL DATA:  Unwitnessed fall earlier today with left shoulder pain posteriorly. EXAM: LEFT SHOULDER - 2+ VIEW COMPARISON:  None. FINDINGS: Minimal degenerate change of the [facility] joint and glenohumeral joints. No evidence of fracture or dislocation. IMPRESSION: No acute findings. Electronically Signed   By: Marin Olp M.D.   On: 02/19/2018 21:45   Ct Maxillofacial Wo Contrast  Result Date: 02/19/2018 CLINICAL DATA:  Unwitnessed fall today. RIGHT periorbital bruising. History of dementia, hypertension and hyperlipidemia. EXAM: CT HEAD WITHOUT CONTRAST  CT MAXILLOFACIAL WITHOUT CONTRAST CT CERVICAL SPINE WITHOUT CONTRAST TECHNIQUE: Multidetector CT imaging of the head, cervical spine, and maxillofacial structures were performed using the standard protocol without intravenous contrast. Multiplanar CT image reconstructions of the cervical spine and maxillofacial structures were also generated. COMPARISON:  MRI head October 28, 2016 FINDINGS: CT HEAD FINDINGS BRAIN: Stable moderate to severe parenchymal brain volume loss. No hydrocephalus. No intraparenchymal hemorrhage, mass effect nor midline shift. Patchy supratentorial white matter hypodensities. No acute large vascular territory infarcts. No abnormal extra-axial fluid collections. Basal cisterns are patent. VASCULAR: Trace calcific atherosclerosis. Dolichoectatic intracranial vessels stable from prior imaging seen with chronic hypertension. SKULL/SOFT TISSUES: No  skull fracture. No significant soft tissue swelling. OTHER: None. CT MAXILLOFACIAL FINDINGS OSSEOUS: No acute facial fracture. The mandible is intact, the condyles are located. No destructive bony lesions. ORBITS: Ocular globes and orbital contents are nonacute. Status post RIGHT ocular lens implant. SINUSES: Lobulated RIGHT ethmoid mucosal thickening. Intact nasal septum is midline. Mastoid aircells are well aerated. SOFT TISSUES: RIGHT periorbital soft tissue swelling sting to premalar soft tissues with subcutaneous fat stranding. No subcutaneous gas or radiopaque foreign bodies. CT CERVICAL SPINE FINDINGS ALIGNMENT: Maintenance of cervical lordosis. Minimal grade 1 C7-T1 anterolisthesis. Mild cervicothoracic levoscoliosis. SKULL BASE AND VERTEBRAE: Cervical vertebral bodies and posterior elements are intact. Severe C5-6 and C6-7 disc height loss with endplate spurring compatible with degenerative discs, moderate at C4-5 and C7-T1. No destructive bony lesions. C1-2 articulation maintained. SOFT TISSUES AND SPINAL CANAL: Nonacute. Mild calcific  atherosclerosis carotid bifurcations. Status post thyroidectomy. DISC LEVELS: No significant osseous canal stenosis. Severe LEFT C3-4, LEFT C4-5 neural foraminal narrowing. UPPER CHEST: Lung apices are clear. OTHER: None. IMPRESSION: CT HEAD: 1. No acute intracranial process. 2. Stable moderate to severe parenchymal brain volume loss. 3. Stable moderate to severe chronic small vessel ischemic changes. CT MAXILLOFACIAL: 1. RIGHT periorbital soft tissue swelling/hematoma, no postseptal extension. 2. No facial fracture. CT CERVICAL SPINE: 1. No acute fracture. Grade 1 C7-T1 anterolisthesis on a degenerative basis. 2. Severe LEFT C3-4 and C4-5 neural foraminal narrowing. Electronically Signed   By: Elon Alas M.D.   On: 02/19/2018 21:55    Procedures Procedures (including critical care time)  Medications Ordered in ED Medications  Tdap (BOOSTRIX) injection 0.5 mL (has no administration in time range)     Initial Impression / Assessment and Plan / ED Course  I have reviewed the triage vital signs and the nursing notes.  Pertinent labs & imaging results that were available during my care of the patient were reviewed by me and considered in my medical decision making (see chart for details).     83 year old female is here for evaluation after injury sustained after unwitnessed fall vs syncope.  Patient has no recollection of the fall or events leading up to it.    In regards to injuries, CT and x-rays obtained are without acute injuries.  She is ambulatory at baseline. Exam is otherwise reassuring and she has no obvious signs of chest, abdominal, pelvis or other extremity injury.  Her neuro exam is normal.    Patient does not have frequent falls at baseline.  In setting of dementia, history is limited.  Given age, risk, will initiate work-up for syncope.    2258: Pt will be handed off to oncoming EDPA who will f/u on labs, EKG, trop.  Consider admission for syncope work up.  Of note, pt  admitted in 2016 for symptomatic bradycardia. Last echo 2016 normal.    Final Clinical Impressions(s) / ED Diagnoses   Final diagnoses:  Unwitnessed fall  Facial contusion, initial encounter    ED Discharge Orders    None       Arlean Hopping 02/19/18 2259    Lacretia Leigh, MD 02/19/18 [phone]

## 2018-02-19 NOTE — ED Provider Notes (Signed)
Medical screening examination/treatment/procedure(s) were conducted as a shared visit with non-physician practitioner(s) and myself.  I personally evaluated the patient during the encounter.  None 83 year old female here after unwitnessed fall.  Possible syncopal event.  Contusions to the right side of her face.  Patient's imaging is reassuring.  Patient will require work-up for possible syncope.   Lacretia Leigh, MD 02/19/18 2223

## 2018-02-19 NOTE — ED Notes (Addendum)
Patient not in room. Will return for labs and Tdap shot.

## 2018-02-20 LAB — URINALYSIS, ROUTINE W REFLEX MICROSCOPIC
BILIRUBIN URINE: NEGATIVE
Glucose, UA: NEGATIVE mg/dL
HGB URINE DIPSTICK: NEGATIVE
Ketones, ur: 20 mg/dL — AB
Leukocytes, UA: NEGATIVE
NITRITE: NEGATIVE
PROTEIN: NEGATIVE mg/dL
Specific Gravity, Urine: 1.014 (ref 1.005–1.030)
pH: 6 (ref 5.0–8.0)

## 2018-02-20 NOTE — Discharge Instructions (Addendum)
You were evaluated today for fall - possible syncopal event. You can be discharged home as your stated preference, but it is recommended that you see your doctor this week for recheck and return to the emergency department with any new event like fall or passing out, or any new symptoms of concern - severe pain, altered mental status, vomiting.

## 2018-03-23 ENCOUNTER — Other Ambulatory Visit: Payer: Self-pay | Admitting: Adult Health

## 2018-03-26 ENCOUNTER — Other Ambulatory Visit: Payer: Self-pay | Admitting: Adult Health

## 2018-03-27 NOTE — Telephone Encounter (Signed)
Sent to the pharmacy by e-scribe. 

## 2018-03-27 NOTE — Telephone Encounter (Signed)
Ok to refill for 90 days  

## 2018-03-27 NOTE — Telephone Encounter (Signed)
Pt need an appt

## 2018-03-27 NOTE — Telephone Encounter (Signed)
Ok for 30 days 

## 2018-03-27 NOTE — Telephone Encounter (Signed)
Pt has not had her thyroid checked since 07/09/2015.  Please advise.

## 2018-03-28 ENCOUNTER — Other Ambulatory Visit: Payer: Self-pay | Admitting: Adult Health

## 2018-03-28 NOTE — Telephone Encounter (Signed)
Sent to the pharmacy by e-scribe. 

## 2018-04-23 ENCOUNTER — Other Ambulatory Visit: Payer: Self-pay | Admitting: Adult Health

## 2018-06-15 ENCOUNTER — Ambulatory Visit: Payer: Self-pay | Admitting: Neurology

## 2018-06-22 ENCOUNTER — Other Ambulatory Visit: Payer: Self-pay | Admitting: Adult Health

## 2018-06-22 NOTE — Telephone Encounter (Signed)
Rxs sent with note attached.

## 2018-06-22 NOTE — Telephone Encounter (Signed)
Ok to refill for 90 days.   Please advise that she is overdue for an office visit. No further refills will be provided until she is seen

## 2018-07-22 ENCOUNTER — Other Ambulatory Visit: Payer: Self-pay | Admitting: Adult Health

## 2018-07-24 NOTE — Telephone Encounter (Signed)
Denied.  Needs appt.  Not seen since 2018.

## 2018-07-25 ENCOUNTER — Telehealth: Payer: Self-pay | Admitting: Adult Health

## 2018-07-25 NOTE — Telephone Encounter (Signed)
Spoke to [place].  Advised the pt will need an appt.  Hasn't been seen since 2018.  She will call back to schedule when she gets home.  Currently driving.

## 2018-07-25 NOTE — Telephone Encounter (Signed)
Patient needs refills on the following medications:  Levothyroxine 0.088 mg Tab Galantamine ER 24 mg Capsules Amlodipine Besylate 5 mg tab Memantine ER 28 mg capsules  These medications have been 90 day supplies.  Pharmacy:  [facility] in [place]  Call daughter with any questions or concerns at [phone]

## 2018-07-25 NOTE — Telephone Encounter (Signed)
Ok for 30 day supply °

## 2018-07-25 NOTE — Telephone Encounter (Signed)
Pt scheduled for 08/01/2018.  [place] for 30 day supply?

## 2018-07-26 ENCOUNTER — Other Ambulatory Visit: Payer: Self-pay | Admitting: Adult Health

## 2018-07-26 MED ORDER — GALANTAMINE HYDROBROMIDE ER 24 MG PO CP24: 24.0000 mg | ORAL_CAPSULE | Freq: Every day | ORAL | 0 refills | Status: DC

## 2018-07-26 MED ORDER — MEMANTINE HCL ER 28 MG PO CP24: 28.0000 mg | ORAL_CAPSULE | Freq: Every day | ORAL | 0 refills | Status: DC

## 2018-07-26 MED ORDER — AMLODIPINE BESYLATE 5 MG PO TABS: 5.0000 mg | ORAL_TABLET | Freq: Every day | ORAL | 0 refills | Status: DC

## 2018-07-26 MED ORDER — LEVOTHYROXINE SODIUM 88 MCG PO TABS: ORAL_TABLET | ORAL | 0 refills | Status: DC

## 2018-07-26 NOTE — Telephone Encounter (Signed)
Sent to the pharmacy by e-scribe for 30 days.  Nothing further needed.

## 2018-08-01 ENCOUNTER — Encounter: Payer: Self-pay | Admitting: Adult Health

## 2018-08-01 ENCOUNTER — Ambulatory Visit (INDEPENDENT_AMBULATORY_CARE_PROVIDER_SITE_OTHER): Payer: Medicare Other | Admitting: Adult Health

## 2018-08-01 VITALS — BP 150/70 | Temp 97.4°F | Ht 62.5 in | Wt 115.0 lb

## 2018-08-01 DIAGNOSIS — Z0000 Encounter for general adult medical examination without abnormal findings: Secondary | ICD-10-CM | POA: Diagnosis not present

## 2018-08-01 DIAGNOSIS — N3281 Overactive bladder: Secondary | ICD-10-CM

## 2018-08-01 DIAGNOSIS — F0280 Dementia in other diseases classified elsewhere without behavioral disturbance: Secondary | ICD-10-CM

## 2018-08-01 DIAGNOSIS — E039 Hypothyroidism, unspecified: Secondary | ICD-10-CM | POA: Diagnosis not present

## 2018-08-01 DIAGNOSIS — G309 Alzheimer's disease, unspecified: Secondary | ICD-10-CM | POA: Diagnosis not present

## 2018-08-01 LAB — CBC WITH DIFFERENTIAL/PLATELET
Basophils Absolute: 0 10*3/uL (ref 0.0–0.1)
Basophils Relative: 0.5 % (ref 0.0–3.0)
Eosinophils Absolute: 0.2 10*3/uL (ref 0.0–0.7)
Eosinophils Relative: 4.3 % (ref 0.0–5.0)
HEMATOCRIT: 40.2 % (ref 36.0–46.0)
Hemoglobin: 13.2 g/dL (ref 12.0–15.0)
Lymphocytes Relative: 36.4 % (ref 12.0–46.0)
Lymphs Abs: 2 10*3/uL (ref 0.7–4.0)
MCHC: 32.9 g/dL (ref 30.0–36.0)
MCV: 89.4 fl (ref 78.0–100.0)
Monocytes Absolute: 0.5 10*3/uL (ref 0.1–1.0)
Monocytes Relative: 9.4 % (ref 3.0–12.0)
Neutro Abs: 2.7 10*3/uL (ref 1.4–7.7)
Neutrophils Relative %: 49.4 % (ref 43.0–77.0)
Platelets: 183 10*3/uL (ref 150.0–400.0)
RBC: 4.49 Mil/uL (ref 3.87–5.11)
RDW: 14.6 % (ref 11.5–15.5)
WBC: 5.5 10*3/uL (ref 4.0–10.5)

## 2018-08-01 LAB — COMPREHENSIVE METABOLIC PANEL
ALBUMIN: 4.5 g/dL (ref 3.5–5.2)
ALT: 19 U/L (ref 0–35)
AST: 22 U/L (ref 0–37)
Alkaline Phosphatase: 54 U/L (ref 39–117)
BUN: 31 mg/dL — ABNORMAL HIGH (ref 6–23)
CO2: 31 mEq/L (ref 19–32)
Calcium: 10 mg/dL (ref 8.4–10.5)
Chloride: 104 mEq/L (ref 96–112)
Creatinine, Ser: 1.24 mg/dL — ABNORMAL HIGH (ref 0.40–1.20)
GFR: 41.46 mL/min — ABNORMAL LOW (ref >60.00)
Glucose, Bld: 82 mg/dL (ref 70–99)
Potassium: 4.3 mEq/L (ref 3.5–5.1)
Sodium: 141 mEq/L (ref 135–145)
TOTAL PROTEIN: 6.5 g/dL (ref 6.0–8.3)
Total Bilirubin: 0.4 mg/dL (ref 0.2–1.2)

## 2018-08-01 LAB — TSH: TSH: 68.22 u[IU]/mL — AB (ref 0.35–4.50)

## 2018-08-01 NOTE — Patient Instructions (Signed)
It was great seeing you today   Keep staying active  We will follow up with you about your lab work.   Please let me know if you need anything

## 2018-08-01 NOTE — Progress Notes (Signed)
Subjective:    Patient ID: Julia [name], female    DOB: August 29, 1934, 84 y.o.   MRN: [id number]  HPI Patient presents for yearly preventative medicine examination. She is a pleasant 84 year old female who  has a past medical history of Alzheimer's dementia, Anxiety, Concussion with no loss of consciousness (05/14/2008), Dementia, GERD (gastroesophageal reflux disease), History of colonic polyps, echocardiogram, Hyperlipidemia, Hypertension, Hypothyroidism, Osteopenia, Right forearm fracture (2011), and Thyroid disease.   Her daughter is with her at this visit  She was last seen by me in 04/2017  Hypothyroidism - takes Synthroid 88 mcg.  Lab Results  Component Value Date   TSH 2.94 07/09/2015   Alzheimer Dementia - Currently prescribed Galantamine ER 24mg  daily, memantine ER 28mg  daily. She takes Remeron 15 mg [facility] and Seroquel 25 mg at [facility] for agitation.  She continues to live at home and performs most of her activities of daily living.  Her husband is also living at home with her but her daughter reports that he is having some declining health issues.  Family has been keeping a closer eye on the patient.  She is no longer driving.  Daughter reports that she feels as though the Alzheimer's has been pretty stable, she has not noticed any sudden decline in her memory.  Her daughter is getting groceries and doing the finances for the family.  She is sleeping well  Urinary Incontinence- Is seen by Urology at [facility] . Is currently prescribed Myrbetriq. Family reports that they feel as though this medication is helping.   Hypertension -Norvasc 5 mg BP Readings from Last 3 Encounters:  08/01/18 (!) 150/70  02/20/18 136/62  06/15/17 140/66    All immunizations and health maintenance protocols were reviewed with the patient and needed orders were placed. Up to date on vaccinations   Appropriate screening laboratory values were ordered for the patient including screening of  hyperlipidemia, renal function and hepatic function.  Medication reconciliation,  past medical history, social history, problem list and allergies were reviewed in detail with the patient  Goals were established with regard to weight loss, exercise, and  diet in compliance with medications.  He continues to stay active around the house.  She is eating and tries to eat heart healthy diet. Wt Readings from Last 3 Encounters:  08/01/18 115 lb (52.2 kg)  02/19/18 105 lb (47.6 kg)  06/15/17 105 lb 3.2 oz (47.7 kg)   End of life planning was discussed.  Dance directive and living will are in place    Review of Systems  Unable to perform ROS: Dementia   Past Medical History:  Diagnosis Date  . Alzheimer's dementia ([place])   . Anxiety   . Concussion with no loss of consciousness 05/14/2008   Qualifier: Diagnosis of  By: Arnoldo Morale MD, Balinda Quails   . Dementia [facility])   . GERD (gastroesophageal reflux disease)   . History of colonic polyps   . Hx of echocardiogram    Echo 4/16:  EF 55-60%, mild LVH  . Hyperlipidemia   . Hypertension   . Hypothyroidism   . Osteopenia   . Right forearm fracture 2011  . Thyroid disease    hypothyroidism    Social History   Socioeconomic History  . Marital status: Married    Spouse name: Not on file  . Number of children: Not on file  . Years of education: Not on file  . Highest education level: Not on file  Occupational History  .  Occupation: retired  Scientific laboratory technician  . Financial resource strain: Not on file  . Food insecurity:    Worry: Not on file    Inability: Not on file  . Transportation needs:    Medical: Not on file    Non-medical: Not on file  Tobacco Use  . Smoking status: Never Smoker  . Smokeless tobacco: Never Used  Substance and Sexual Activity  . Alcohol use: No    Alcohol/week: 0.0 standard drinks  . Drug use: No  . Sexual activity: Never  Lifestyle  . Physical activity:    Days per week: Not on file    Minutes per session: Not  on file  . Stress: Not on file  Relationships  . Social connections:    Talks on phone: Not on file    Gets together: Not on file    Attends religious service: Not on file    Active member of club or organization: Not on file    Attends meetings of clubs or organizations: Not on file    Relationship status: Not on file  . Intimate partner violence:    Fear of current or ex partner: Not on file    Emotionally abused: Not on file    Physically abused: Not on file    Forced sexual activity: Not on file  Other Topics Concern  . Not on file  Social History Narrative   Married to husband Ardyth Gal for 72 years and lives in [place]. 2 daughters live in [place], 1 son in [place] and 1 in [place].       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       Hobbies: tv, caring for cat (very important to her), change is difficult even with type of tv      Jake Samples (daughter) Chauncey Reading [phone]   Full Code    Past Surgical History:  Procedure Laterality Date  . ABDOMINAL HYSTERECTOMY  2009  . BILATERAL SALPINGOOPHORECTOMY    . bladder tack    . CATARACT EXTRACTION Right at least a year ago  . FEMUR IM NAIL Right 03/27/2017   Procedure: INTRAMEDULLARY (IM) NAIL FEMORAL;  Surgeon: Renette Butters, MD;  Location: [facility];  Service: Orthopedics;  Laterality: Right;  . HIP PINNING,CANNULATED Right 11/07/2016   Procedure: RIGHT CANNULATED HIP PINNING;  Surgeon: Renette Butters, MD;  Location: [facility];  Service: Orthopedics;  Laterality: Right;  . INCISION AND DRAINAGE OF WOUND Left 08/04/2015   Procedure: DEBRIDEMENT DISTAL INTERPHALANGEAL LEFT INDEX FINGER;  Surgeon: Daryll Brod, MD;  Location: [facility];  Service: Orthopedics;  Laterality: Left;  Marland Kitchen MASS EXCISION N/A 08/04/2015   Procedure: EXCISION MUCOID CYST;  Surgeon: Daryll Brod, MD;  Location: [facility];  Service: Orthopedics;  Laterality: N/A;  . MASS EXCISION Left 11/24/2015   Procedure:  EXCISION RECURRENT  MUCOID CYST LEFT INDEX  FINGER;  Surgeon: Daryll Brod, MD;  Location: [facility];  Service: Orthopedics;  Laterality: Left;  . ROTATOR CUFF REPAIR     right shoulder  . surgical repair to fx left wrist    . TONSILLECTOMY      Family History  Problem Relation Age of Onset  . Hypertension Mother   . COPD Mother   . Tuberculosis Mother        x 2  . Lung cancer Father   . Stomach cancer Father   . Heart disease Brother   . Drug abuse Maternal Uncle   .  Cancer Maternal Uncle   . Alzheimer's disease Maternal Aunt   . Colon cancer Neg Hx   . Esophageal cancer Neg Hx   . Rectal cancer Neg Hx   . Heart attack Neg Hx   . Stroke Neg Hx     Allergies  Allergen Reactions  . Entex Other (See Comments)    unknown  . Sulfonamide Derivatives Other (See Comments)    unknown    Current Outpatient Medications on File Prior to Visit  Medication Sig Dispense Refill  . amLODipine (NORVASC) 5 MG tablet Take 1 tablet (5 mg total) by mouth daily. 30 tablet 0  . aspirin EC 81 MG tablet Take 81 mg by mouth daily.    Marland Kitchen Besifloxacin HCl (BESIVANCE) 0.6 % SUSP Place 1 drop into both eyes at bedtime.    Marland Kitchen galantamine (RAZADYNE ER) 24 MG 24 hr capsule Take 1 capsule (24 mg total) by mouth daily. 30 capsule 0  . ibuprofen (ADVIL,MOTRIN) 200 MG tablet Take 200 mg by mouth daily.    Marland Kitchen latanoprost (XALATAN) 0.005 % ophthalmic solution Place 1 drop into both eyes at bedtime.    Marland Kitchen levothyroxine (SYNTHROID, LEVOTHROID) 88 MCG tablet TAKE 1 TABLET BY MOUTH EVERY DAY BEFORE BREAKFAST 30 tablet 0  . memantine (NAMENDA XR) 28 MG CP24 24 hr capsule Take 1 capsule (28 mg total) by mouth daily. 30 capsule 0  . mirtazapine (REMERON) 15 MG tablet TAKE 1 TABLET BY MOUTH AT BEDTIME 90 tablet 0  . Multiple Vitamin (MULTIVITAMIN) tablet Take 1 tablet by mouth daily.    . Multiple Vitamins-Minerals (CENTRUM SILVER PO) Take 1 tablet by mouth daily.    Marland Kitchen MYRBETRIQ 50 MG TB24 tablet Take 50  mg by mouth daily.  3  . Omega-3 Fatty Acids (FISH OIL) 1000 MG CAPS Take 1,000 mg by mouth daily.    . polyethylene glycol (MIRALAX / GLYCOLAX) packet Take 17 g by mouth daily as needed for mild constipation. Reported on 10/17/2015    . QUEtiapine (SEROQUEL) 25 MG tablet TAKE 1 TABLET BY MOUTH AT BEDTIME 90 tablet 0  . rosuvastatin (CRESTOR) 20 MG tablet Take 1 tablet (20 mg total) by mouth once a week. 15 tablet 3   No current facility-administered medications on file prior to visit.     BP (!) 150/70   Temp (!) 97.4 F (36.3 C) (Oral)   Ht 5' 2.5" (1.588 m)   Wt 115 lb (52.2 kg)   BMI 20.70 kg/m       Objective:   Physical Exam Vitals signs and nursing note reviewed.  Constitutional:      Appearance: Normal appearance.  HENT:     Right Ear: Tympanic membrane, ear canal and external ear normal. There is no impacted cerumen.     Left Ear: Tympanic membrane, ear canal and external ear normal. There is no impacted cerumen.     Nose: Nose normal.     Mouth/Throat:     Mouth: Mucous membranes are moist.     Pharynx: Oropharynx is clear.  Cardiovascular:     Rate and Rhythm: Normal rate and regular rhythm.     Pulses: Normal pulses.     Heart sounds: Normal heart sounds.  Pulmonary:     Effort: Pulmonary effort is normal.     Breath sounds: Normal breath sounds.  Abdominal:     General: Abdomen is flat. Bowel sounds are normal.     Palpations: Abdomen is soft. There is no mass.  Tenderness: There is no abdominal tenderness.     Hernia: No hernia is present.  Musculoskeletal: Normal range of motion.        General: No swelling, tenderness, deformity or signs of injury.     Right lower leg: No edema.     Left lower leg: No edema.  Skin:    General: Skin is warm and dry.     Capillary Refill: Capillary refill takes less than 2 seconds.  Neurological:     Mental Status: She is alert.     Cranial Nerves: Cranial nerves are intact.     Sensory: Sensation is intact.      Coordination: Coordination normal. Finger-Nose-Finger Test normal.     Gait: Gait is intact.     Deep Tendon Reflexes: Reflexes are normal and symmetric.     Comments: Walks with a slow steady gait  Out of 5 strength bilateral upper and lower extremities  Psychiatric:        Attention and Perception: Attention normal. She does not perceive auditory or visual hallucinations.        Mood and Affect: Mood normal. Affect is flat.        Speech: Speech normal.        Behavior: Behavior normal.        Thought Content: Thought content normal.        Cognition and Memory: Cognition is impaired. Memory is impaired.        Judgment: Judgment normal.       Assessment & Plan:  1. Routine general medical examination at a health care facility - CBC with Differential/Platelet - Comprehensive metabolic panel - TSH  2. Alzheimer's dementia without behavioral disturbance, unspecified timing of dementia onset ([place]) - No changes in medications; she appears stable  - Follow up in 12 months or sooner if needed - CBC with Differential/Platelet - Comprehensive metabolic panel - TSH  3. Hypothyroidism, acquired - Consider doze increase - CBC with Differential/Platelet - Comprehensive metabolic panel - TSH  4. Overactive bladder - Continue wit Myrbetriq   Dorothyann Peng, NP

## 2018-08-21 ENCOUNTER — Other Ambulatory Visit: Payer: Self-pay | Admitting: Adult Health

## 2018-08-22 NOTE — Telephone Encounter (Signed)
Sent to the pharmacy by e-scribe. 

## 2018-09-15 ENCOUNTER — Other Ambulatory Visit: Payer: Self-pay | Admitting: Adult Health

## 2018-09-17 NOTE — Telephone Encounter (Signed)
Sent to the pharmacy by e-scribe. 

## 2018-10-08 ENCOUNTER — Other Ambulatory Visit: Payer: Self-pay

## 2018-10-08 ENCOUNTER — Other Ambulatory Visit: Payer: Medicare Other

## 2018-11-19 ENCOUNTER — Other Ambulatory Visit: Payer: Self-pay | Admitting: Adult Health

## 2018-11-21 ENCOUNTER — Other Ambulatory Visit: Payer: Self-pay | Admitting: Adult Health

## 2018-11-21 ENCOUNTER — Other Ambulatory Visit: Payer: Self-pay | Admitting: Family Medicine

## 2018-11-21 DIAGNOSIS — E039 Hypothyroidism, unspecified: Secondary | ICD-10-CM

## 2018-11-21 NOTE — Telephone Encounter (Signed)
Sent to the pharmacy by e-scribe for 30 days.  Pt scheduled for next week.

## 2018-11-27 ENCOUNTER — Other Ambulatory Visit (INDEPENDENT_AMBULATORY_CARE_PROVIDER_SITE_OTHER): Payer: Medicare Other

## 2018-11-27 ENCOUNTER — Other Ambulatory Visit: Payer: Self-pay

## 2018-11-27 DIAGNOSIS — E039 Hypothyroidism, unspecified: Secondary | ICD-10-CM | POA: Diagnosis not present

## 2018-11-27 LAB — TSH: TSH: 108.5 u[IU]/mL — ABNORMAL HIGH (ref 0.35–4.50)

## 2018-12-28 ENCOUNTER — Other Ambulatory Visit: Payer: Self-pay

## 2018-12-28 ENCOUNTER — Other Ambulatory Visit (INDEPENDENT_AMBULATORY_CARE_PROVIDER_SITE_OTHER): Payer: Medicare Other

## 2018-12-28 DIAGNOSIS — E039 Hypothyroidism, unspecified: Secondary | ICD-10-CM | POA: Diagnosis not present

## 2018-12-28 LAB — TSH: TSH: 7.25 u[IU]/mL — ABNORMAL HIGH (ref 0.35–4.50)

## 2019-01-01 ENCOUNTER — Other Ambulatory Visit: Payer: Self-pay | Admitting: Family Medicine

## 2019-01-01 MED ORDER — LEVOTHYROXINE SODIUM 88 MCG PO TABS: 88.0000 ug | ORAL_TABLET | Freq: Every day | ORAL | 3 refills | Status: DC

## 2019-03-20 ENCOUNTER — Other Ambulatory Visit: Payer: Self-pay | Admitting: Family Medicine

## 2019-03-20 MED ORDER — QUETIAPINE FUMARATE 25 MG PO TABS: 25.0000 mg | ORAL_TABLET | Freq: Every day | ORAL | 1 refills | Status: DC

## 2019-03-20 MED ORDER — MIRTAZAPINE 15 MG PO TABS: 15.0000 mg | ORAL_TABLET | Freq: Every day | ORAL | 1 refills | Status: DC

## 2019-03-20 NOTE — Telephone Encounter (Signed)
Sent to the pharmacy by e-scribe. 

## 2019-08-13 ENCOUNTER — Other Ambulatory Visit: Payer: Self-pay | Admitting: Adult Health

## 2019-08-14 NOTE — Telephone Encounter (Signed)
[place] for 90 days of all medications

## 2019-08-14 NOTE — Telephone Encounter (Signed)
DUE FOR CPX.  WILL CALL

## 2019-08-14 NOTE — Telephone Encounter (Signed)
SENT TO THE PHARMACY AS DIRECTED.

## 2019-09-12 ENCOUNTER — Other Ambulatory Visit: Payer: Self-pay | Admitting: Adult Health

## 2019-09-12 NOTE — Telephone Encounter (Signed)
[facility] for 30 days. It has been over a year since she has been seen

## 2019-09-12 NOTE — Telephone Encounter (Signed)
Sent to the pharmacy by e-scribe. 

## 2019-11-11 ENCOUNTER — Other Ambulatory Visit: Payer: Self-pay | Admitting: Adult Health

## 2019-11-12 NOTE — Telephone Encounter (Signed)
Denied.  Pt needs cpx/appt

## 2019-12-11 ENCOUNTER — Other Ambulatory Visit: Payer: Self-pay | Admitting: Adult Health

## 2019-12-11 NOTE — Telephone Encounter (Signed)
Denied.  Pt needs yearly visit and lab work.

## 2019-12-30 ENCOUNTER — Other Ambulatory Visit: Payer: Self-pay | Admitting: Adult Health

## 2020-01-01 NOTE — Telephone Encounter (Signed)
Denied.  Needs cpx and lab work.

## 2020-01-07 ENCOUNTER — Other Ambulatory Visit: Payer: Self-pay

## 2020-01-07 ENCOUNTER — Ambulatory Visit (INDEPENDENT_AMBULATORY_CARE_PROVIDER_SITE_OTHER): Payer: Medicare Other | Admitting: Adult Health

## 2020-01-07 ENCOUNTER — Encounter: Payer: Self-pay | Admitting: Adult Health

## 2020-01-07 VITALS — BP 130/68 | Temp 98.3°F | Wt 116.0 lb

## 2020-01-07 DIAGNOSIS — E039 Hypothyroidism, unspecified: Secondary | ICD-10-CM | POA: Diagnosis not present

## 2020-01-07 DIAGNOSIS — F0280 Dementia in other diseases classified elsewhere without behavioral disturbance: Secondary | ICD-10-CM

## 2020-01-07 DIAGNOSIS — I10 Essential (primary) hypertension: Secondary | ICD-10-CM

## 2020-01-07 DIAGNOSIS — Z0000 Encounter for general adult medical examination without abnormal findings: Secondary | ICD-10-CM | POA: Diagnosis not present

## 2020-01-07 DIAGNOSIS — G309 Alzheimer's disease, unspecified: Secondary | ICD-10-CM

## 2020-01-07 MED ORDER — GALANTAMINE HYDROBROMIDE ER 24 MG PO CP24: 24.0000 mg | ORAL_CAPSULE | Freq: Every day | ORAL | 1 refills | Status: DC

## 2020-01-07 MED ORDER — QUETIAPINE FUMARATE 25 MG PO TABS: ORAL_TABLET | ORAL | 1 refills | Status: DC

## 2020-01-07 MED ORDER — AMLODIPINE BESYLATE 5 MG PO TABS: 5.0000 mg | ORAL_TABLET | Freq: Every day | ORAL | 3 refills | Status: DC

## 2020-01-07 MED ORDER — MEMANTINE HCL ER 28 MG PO CP24: 28.0000 mg | ORAL_CAPSULE | Freq: Every day | ORAL | 1 refills | Status: DC

## 2020-01-07 MED ORDER — MIRTAZAPINE 15 MG PO TABS: ORAL_TABLET | ORAL | 1 refills | Status: DC

## 2020-01-07 NOTE — Progress Notes (Signed)
Subjective:    Patient ID: Julia [name], female    DOB: 1934/07/15, 85 y.o.   MRN: [id number]  HPI Patient presents for preventative medicine examination. She is an 85 year old female who  has a past medical history of Alzheimer's dementia ([place]), Anxiety, Concussion with no loss of consciousness (05/14/2008), Dementia ([place]), GERD (gastroesophageal reflux disease), History of colonic polyps, echocardiogram, Hyperlipidemia, Hypertension, Hypothyroidism, Osteopenia, Right forearm fracture (2011), and Thyroid disease.  Her daughter is with her at this visit.   She was last seen in March 2020 .   She has a history of hypothyroidism which she takes Synthroid 88 mcg for. In the past there was concern that she was not taking the medication every day.  Lab Results  Component Value Date   TSH 7.25 (H) 12/28/2018   Alzheimer's dementia-currently prescribed galantamine ER 24 mg daily and Namenda ER 28 mg daily.  In addition to this she also takes Remeron 15 mg nightly and Seroquel 25 mg nightly for agitation.  She continues to live at home and performs most of her activities of daily living.  Family keeps a close eye on her daily.  She is no longer driving and has not done so for many years.  Her daughter reports that she has noticed a very slow decline over the last year with the patient remembering names someone that she does not see on a routine basis.  Hypertension - takes Norvasc 5 mg.  She denies dizziness, lightheadedness, chest pain, or shortness of breath  All immunizations and health maintenance protocols were reviewed with the patient and needed orders were placed.  Appropriate screening laboratory values were ordered for the patient including screening of hyperlipidemia, renal function and hepatic function.  Medication reconciliation,  past medical history, social history, problem list and allergies were reviewed in detail with the patient  Goals were established with regard to  weight loss, exercise, and  diet in compliance with medications. Her appetite is good and she stays active at home. She leaves once a week to get her hair washed.   Wt Readings from Last 3 Encounters:  01/07/20 116 lb (52.6 kg)  08/01/18 115 lb (52.2 kg)  02/19/18 105 lb (47.6 kg)     End of life planning was discussed. Advanced directive and living will are in place.    Review of Systems  Constitutional: Negative.   HENT: Negative.   Eyes: Negative.   Respiratory: Negative.   Cardiovascular: Negative.   Gastrointestinal: Negative.   Endocrine: Negative.   Genitourinary: Negative.   Musculoskeletal: Positive for arthralgias.  Allergic/Immunologic: Negative.   Psychiatric/Behavioral: Positive for agitation, confusion and decreased concentration.   Past Medical History:  Diagnosis Date   Alzheimer's dementia [facility])    Anxiety    Concussion with no loss of consciousness 05/14/2008   Qualifier: Diagnosis of  By: Arnoldo Morale MD, Balinda Quails    Dementia [facility])    GERD (gastroesophageal reflux disease)    History of colonic polyps    Hx of echocardiogram    Echo 4/16:  EF 55-60%, mild LVH   Hyperlipidemia    Hypertension    Hypothyroidism    Osteopenia    Right forearm fracture 2011   Thyroid disease    hypothyroidism    Social History   Socioeconomic History   Marital status: Unknown    Spouse name: Not on file   Number of children: Not on file   Years of education: Not on file  Highest education level: Not on file  Occupational History   Occupation: retired  Tobacco Use   Smoking status: Never Smoker   Smokeless tobacco: Never Used  Substance and Sexual Activity   Alcohol use: No    Alcohol/week: 0.0 standard drinks   Drug use: No   Sexual activity: Never  Other Topics Concern   Not on file  Social History Narrative   Married to husband [place] for 53 years and lives in [place]. 2 daughters live in [place], 1 son in [place] and 1 in  [place].       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       Hobbies: tv, caring for cat (very important to her), change is difficult even with type of tv      Jake Samples (daughter) Chauncey Reading [phone]   Full Code   Social Determinants of Health   Financial Resource Strain:    Difficulty of Paying Living Expenses:   Food Insecurity:    Worried About Charity fundraiser in the Last Year:    Arboriculturist in the Last Year:   Transportation Needs:    Film/video editor (Medical):    Lack of Transportation (Non-Medical):   Physical Activity:    Days of Exercise per Week:    Minutes of Exercise per Session:   Stress:    Feeling of Stress :   Social Connections:    Frequency of Communication with Friends and Family:    Frequency of Social Gatherings with Friends and Family:    Attends Religious Services:    Active Member of Clubs or Organizations:    Attends Music therapist:    Marital Status:   Intimate Partner Violence:    Fear of Current or Ex-Partner:    Emotionally Abused:    Physically Abused:    Sexually Abused:     Past Surgical History:  Procedure Laterality Date   ABDOMINAL HYSTERECTOMY  2009   BILATERAL SALPINGOOPHORECTOMY     bladder tack     CATARACT EXTRACTION Right at least a year ago   FEMUR IM NAIL Right 03/27/2017   Procedure: INTRAMEDULLARY (IM) NAIL FEMORAL;  Surgeon: Renette Butters, MD;  Location: [facility];  Service: Orthopedics;  Laterality: Right;   HIP PINNING,CANNULATED Right 11/07/2016   Procedure: RIGHT CANNULATED HIP PINNING;  Surgeon: Renette Butters, MD;  Location: [facility];  Service: Orthopedics;  Laterality: Right;   INCISION AND DRAINAGE OF WOUND Left 08/04/2015   Procedure: DEBRIDEMENT DISTAL INTERPHALANGEAL LEFT INDEX FINGER;  Surgeon: Daryll Brod, MD;  Location: [facility];  Service: Orthopedics;  Laterality: Left;   MASS EXCISION N/A 08/04/2015   Procedure: EXCISION  MUCOID CYST;  Surgeon: Daryll Brod, MD;  Location: [facility];  Service: Orthopedics;  Laterality: N/A;   MASS EXCISION Left 11/24/2015   Procedure: EXCISION RECURRENT  MUCOID CYST LEFT INDEX  FINGER;  Surgeon: Daryll Brod, MD;  Location: [facility];  Service: Orthopedics;  Laterality: Left;   ROTATOR CUFF REPAIR     right shoulder   surgical repair to fx left wrist     TONSILLECTOMY      Family History  Problem Relation Age of Onset   Hypertension Mother    COPD Mother    Tuberculosis Mother        x 2   Lung cancer Father    Stomach cancer Father    Heart disease Brother  Drug abuse Maternal Uncle    Cancer Maternal Uncle    Alzheimer's disease Maternal Aunt    Colon cancer Neg Hx    Esophageal cancer Neg Hx    Rectal cancer Neg Hx    Heart attack Neg Hx    Stroke Neg Hx     Allergies  Allergen Reactions   Entex Other (See Comments)    unknown   Sulfonamide Derivatives Other (See Comments)    unknown    Current Outpatient Medications on File Prior to Visit  Medication Sig Dispense Refill   acetaminophen (TYLENOL) 500 MG tablet Take 500 mg by mouth in the morning and at bedtime.     Besifloxacin HCl (BESIVANCE) 0.6 % SUSP Place 1 drop into both eyes at bedtime.     latanoprost (XALATAN) 0.005 % ophthalmic solution Place 1 drop into both eyes at bedtime.     Multiple Vitamins-Minerals (CENTRUM SILVER PO) Take 1 tablet by mouth daily.     polyethylene glycol (MIRALAX / GLYCOLAX) packet Take 17 g by mouth daily as needed for mild constipation. Reported on 10/17/2015     levothyroxine (SYNTHROID) 88 MCG tablet Take 1 tablet (88 mcg total) by mouth daily before breakfast. 90 tablet 3   No current facility-administered medications on file prior to visit.    BP 130/68    Temp 98.3 F (36.8 C)    Wt 116 lb (52.6 kg)    BMI 20.88 kg/m       Objective:   Physical Exam Vitals and nursing note reviewed.    Constitutional:      Appearance: Normal appearance.  HENT:     Right Ear: Tympanic membrane, ear canal and external ear normal. There is no impacted cerumen.     Left Ear: Tympanic membrane, ear canal and external ear normal. There is no impacted cerumen.     Nose: Nose normal. No congestion or rhinorrhea.     Mouth/Throat:     Mouth: Mucous membranes are moist.     Dentition: Abnormal dentition.     Pharynx: Oropharynx is clear.  Eyes:     Extraocular Movements: Extraocular movements intact.     Pupils: Pupils are equal, round, and reactive to light.  Cardiovascular:     Rate and Rhythm: Normal rate and regular rhythm.     Pulses: Normal pulses.     Heart sounds: Normal heart sounds.  Pulmonary:     Effort: Pulmonary effort is normal.     Breath sounds: Normal breath sounds.  Abdominal:     General: Abdomen is flat.     Palpations: Abdomen is soft.  Musculoskeletal:        General: Normal range of motion.  Skin:    General: Skin is warm and dry.     Capillary Refill: Capillary refill takes less than 2 seconds.  Neurological:     General: No focal deficit present.     Mental Status: She is alert and oriented to person, place, and time.  Psychiatric:        Attention and Perception: Attention and perception normal.        Mood and Affect: Mood normal. Affect is blunt.        Speech: Speech normal.        Behavior: Behavior normal. Behavior is cooperative.        Thought Content: Thought content normal.        Cognition and Memory: Cognition is impaired. Memory is impaired.  Judgment: Judgment normal.       Assessment & Plan:  1. Routine general medical examination at a health care facility - Appears to be at baseline.   - Continue to stay active - Follow up in one year or sooner if needed - CBC with Differential/Platelet; Future - TSH; Future - CMP with eGFR([facility]); Future - CMP with eGFR([facility]) - TSH - CBC with Differential/Platelet  2. Hypothyroidism,  acquired - Consider dose change of synthroid  - CBC with Differential/Platelet; Future - TSH; Future - CMP with eGFR([facility]); Future - CMP with eGFR([facility]) - TSH - CBC with Differential/Platelet  3. Alzheimer's dementia without behavioral disturbance, unspecified timing of dementia onset ([place])  - CBC with Differential/Platelet; Future - TSH; Future - CMP with eGFR([facility]); Future - galantamine (RAZADYNE ER) 24 MG 24 hr capsule; Take 1 capsule (24 mg total) by mouth daily.  Dispense: 90 capsule; Refill: 1 - mirtazapine (REMERON) 15 MG tablet; Take 1 tablet by mouth at bedtime.  Dispense: 90 tablet; Refill: 1 - memantine (NAMENDA XR) 28 MG CP24 24 hr capsule; Take 1 capsule (28 mg total) by mouth daily.  Dispense: 90 capsule; Refill: 1 - QUEtiapine (SEROQUEL) 25 MG tablet; Take 1 tablet by mouth at bedtime.  Dispense: 90 tablet; Refill: 1 - CMP with eGFR([facility]) - TSH - CBC with Differential/Platelet  4. Essential hypertension - Continue with Norvasc 5 mg  - CBC with Differential/Platelet; Future - TSH; Future - CMP with eGFR([facility]); Future - amLODipine (NORVASC) 5 MG tablet; Take 1 tablet (5 mg total) by mouth daily.  Dispense: 90 tablet; Refill: 3   Dorothyann Peng, NP

## 2020-01-08 ENCOUNTER — Telehealth: Payer: Self-pay | Admitting: Adult Health

## 2020-01-08 LAB — CBC WITH DIFFERENTIAL/PLATELET
Absolute Monocytes: 627 cells/uL (ref 200–950)
Basophils Absolute: 53 cells/uL (ref 0–200)
Basophils Relative: 0.8 %
Eosinophils Absolute: 132 cells/uL (ref 15–500)
Eosinophils Relative: 2 %
HCT: 44.2 % (ref 35.0–45.0)
Hemoglobin: 14.5 g/dL (ref 11.7–15.5)
Lymphs Abs: 2468 cells/uL (ref 850–3900)
MCH: 29.5 pg (ref 27.0–33.0)
MCHC: 32.8 g/dL (ref 32.0–36.0)
MCV: 89.8 fL (ref 80.0–100.0)
MPV: 9.7 fL (ref 7.5–12.5)
Monocytes Relative: 9.5 %
Neutro Abs: 3320 cells/uL (ref 1500–7800)
Neutrophils Relative %: 50.3 %
Platelets: 263 10*3/uL (ref 140–400)
RBC: 4.92 10*6/uL (ref 3.80–5.10)
RDW: 13.1 % (ref 11.0–15.0)
Total Lymphocyte: 37.4 %
WBC: 6.6 10*3/uL (ref 3.8–10.8)

## 2020-01-08 LAB — COMPLETE METABOLIC PANEL WITH GFR
AG Ratio: 2 (calc) (ref 1.0–2.5)
ALT: 12 U/L (ref 6–29)
AST: 21 U/L (ref 10–35)
Albumin: 4.5 g/dL (ref 3.6–5.1)
Alkaline phosphatase (APISO): 57 U/L (ref 37–153)
BUN/Creatinine Ratio: 17 (calc) (ref 6–22)
BUN: 21 mg/dL (ref 7–25)
CO2: 28 mmol/L (ref 20–32)
Calcium: 10.2 mg/dL (ref 8.6–10.4)
Chloride: 102 mmol/L (ref 98–110)
Creat: 1.22 mg/dL — ABNORMAL HIGH (ref 0.60–0.88)
GFR, Est African American: 48 mL/min/{1.73_m2} — ABNORMAL LOW (ref >=60)
GFR, Est Non African American: 41 mL/min/{1.73_m2} — ABNORMAL LOW (ref >=60)
Globulin: 2.2 g/dL (calc) (ref 1.9–3.7)
Glucose, Bld: 97 mg/dL (ref 65–99)
Potassium: 4.6 mmol/L (ref 3.5–5.3)
Sodium: 140 mmol/L (ref 135–146)
Total Bilirubin: 0.7 mg/dL (ref 0.2–1.2)
Total Protein: 6.7 g/dL (ref 6.1–8.1)

## 2020-01-08 LAB — TSH: TSH: 4.15 mIU/L (ref 0.40–4.50)

## 2020-01-08 MED ORDER — LEVOTHYROXINE SODIUM 88 MCG PO TABS: 88.0000 ug | ORAL_TABLET | Freq: Every day | ORAL | 3 refills | Status: AC

## 2020-01-08 NOTE — Telephone Encounter (Signed)
Updated the patient's daughter, Kenney Houseman on lab results.

## 2020-04-06 ENCOUNTER — Other Ambulatory Visit: Payer: Self-pay | Admitting: Adult Health

## 2020-04-06 DIAGNOSIS — G309 Alzheimer's disease, unspecified: Secondary | ICD-10-CM

## 2020-04-06 DIAGNOSIS — F0280 Dementia in other diseases classified elsewhere without behavioral disturbance: Secondary | ICD-10-CM

## 2020-07-08 ENCOUNTER — Other Ambulatory Visit: Payer: Self-pay | Admitting: Adult Health

## 2020-07-08 DIAGNOSIS — G309 Alzheimer's disease, unspecified: Secondary | ICD-10-CM

## 2020-07-08 DIAGNOSIS — F0280 Dementia in other diseases classified elsewhere without behavioral disturbance: Secondary | ICD-10-CM

## 2020-07-08 NOTE — Telephone Encounter (Signed)
SENT TO THE PHARMACY BY E-SCRIBE. 

## 2020-09-22 ENCOUNTER — Emergency Department (HOSPITAL_COMMUNITY): Payer: Medicare Other

## 2020-09-22 ENCOUNTER — Other Ambulatory Visit: Payer: Self-pay

## 2020-09-22 ENCOUNTER — Encounter (HOSPITAL_COMMUNITY): Payer: Self-pay

## 2020-09-22 ENCOUNTER — Inpatient Hospital Stay (HOSPITAL_COMMUNITY): Payer: Medicare Other

## 2020-09-22 ENCOUNTER — Inpatient Hospital Stay (HOSPITAL_COMMUNITY)
Admission: EM | Admit: 2020-09-22 | Discharge: 2020-09-28 | DRG: 481 | Disposition: A | Discharge status: Skilled Nursing Facility | Payer: Medicare Other | Attending: Internal Medicine | Admitting: Internal Medicine

## 2020-09-22 DIAGNOSIS — T8384XA Pain from genitourinary prosthetic devices, implants and grafts, initial encounter: Secondary | ICD-10-CM | POA: Diagnosis not present

## 2020-09-22 DIAGNOSIS — Y846 Urinary catheterization as the cause of abnormal reaction of the patient, or of later complication, without mention of misadventure at the time of the procedure: Secondary | ICD-10-CM | POA: Diagnosis not present

## 2020-09-22 DIAGNOSIS — R6889 Other general symptoms and signs: Secondary | ICD-10-CM | POA: Diagnosis not present

## 2020-09-22 DIAGNOSIS — I672 Cerebral atherosclerosis: Secondary | ICD-10-CM | POA: Diagnosis not present

## 2020-09-22 DIAGNOSIS — Z79899 Other long term (current) drug therapy: Secondary | ICD-10-CM

## 2020-09-22 DIAGNOSIS — I129 Hypertensive chronic kidney disease with stage 1 through stage 4 chronic kidney disease, or unspecified chronic kidney disease: Secondary | ICD-10-CM | POA: Diagnosis present

## 2020-09-22 DIAGNOSIS — Z8249 Family history of ischemic heart disease and other diseases of the circulatory system: Secondary | ICD-10-CM

## 2020-09-22 DIAGNOSIS — M9711XA Periprosthetic fracture around internal prosthetic right knee joint, initial encounter: Secondary | ICD-10-CM | POA: Diagnosis not present

## 2020-09-22 DIAGNOSIS — K219 Gastro-esophageal reflux disease without esophagitis: Secondary | ICD-10-CM | POA: Diagnosis not present

## 2020-09-22 DIAGNOSIS — Z825 Family history of asthma and other chronic lower respiratory diseases: Secondary | ICD-10-CM

## 2020-09-22 DIAGNOSIS — R0902 Hypoxemia: Secondary | ICD-10-CM | POA: Diagnosis not present

## 2020-09-22 DIAGNOSIS — D62 Acute posthemorrhagic anemia: Secondary | ICD-10-CM | POA: Diagnosis not present

## 2020-09-22 DIAGNOSIS — M2100 Valgus deformity, not elsewhere classified, unspecified site: Secondary | ICD-10-CM | POA: Diagnosis not present

## 2020-09-22 DIAGNOSIS — M6281 Muscle weakness (generalized): Secondary | ICD-10-CM | POA: Diagnosis not present

## 2020-09-22 DIAGNOSIS — G309 Alzheimer's disease, unspecified: Secondary | ICD-10-CM | POA: Diagnosis not present

## 2020-09-22 DIAGNOSIS — F419 Anxiety disorder, unspecified: Secondary | ICD-10-CM | POA: Diagnosis present

## 2020-09-22 DIAGNOSIS — M7989 Other specified soft tissue disorders: Secondary | ICD-10-CM | POA: Diagnosis not present

## 2020-09-22 DIAGNOSIS — E785 Hyperlipidemia, unspecified: Secondary | ICD-10-CM | POA: Diagnosis present

## 2020-09-22 DIAGNOSIS — S72301A Unspecified fracture of shaft of right femur, initial encounter for closed fracture: Secondary | ICD-10-CM | POA: Diagnosis not present

## 2020-09-22 DIAGNOSIS — I10 Essential (primary) hypertension: Secondary | ICD-10-CM | POA: Diagnosis present

## 2020-09-22 DIAGNOSIS — Z888 Allergy status to other drugs, medicaments and biological substances status: Secondary | ICD-10-CM

## 2020-09-22 DIAGNOSIS — R2681 Unsteadiness on feet: Secondary | ICD-10-CM | POA: Diagnosis not present

## 2020-09-22 DIAGNOSIS — N1831 Chronic kidney disease, stage 3a: Secondary | ICD-10-CM | POA: Diagnosis present

## 2020-09-22 DIAGNOSIS — S72401A Unspecified fracture of lower end of right femur, initial encounter for closed fracture: Secondary | ICD-10-CM | POA: Diagnosis not present

## 2020-09-22 DIAGNOSIS — Z01818 Encounter for other preprocedural examination: Secondary | ICD-10-CM | POA: Diagnosis not present

## 2020-09-22 DIAGNOSIS — S72301S Unspecified fracture of shaft of right femur, sequela: Secondary | ICD-10-CM | POA: Diagnosis not present

## 2020-09-22 DIAGNOSIS — R9431 Abnormal electrocardiogram [ECG] [EKG]: Secondary | ICD-10-CM | POA: Diagnosis not present

## 2020-09-22 DIAGNOSIS — R1311 Dysphagia, oral phase: Secondary | ICD-10-CM | POA: Diagnosis not present

## 2020-09-22 DIAGNOSIS — Z681 Body mass index (BMI) 19 or less, adult: Secondary | ICD-10-CM | POA: Diagnosis not present

## 2020-09-22 DIAGNOSIS — I517 Cardiomegaly: Secondary | ICD-10-CM | POA: Diagnosis not present

## 2020-09-22 DIAGNOSIS — Z20822 Contact with and (suspected) exposure to covid-19: Secondary | ICD-10-CM | POA: Diagnosis not present

## 2020-09-22 DIAGNOSIS — S79929A Unspecified injury of unspecified thigh, initial encounter: Secondary | ICD-10-CM | POA: Diagnosis not present

## 2020-09-22 DIAGNOSIS — R52 Pain, unspecified: Secondary | ICD-10-CM | POA: Diagnosis present

## 2020-09-22 DIAGNOSIS — M810 Age-related osteoporosis without current pathological fracture: Secondary | ICD-10-CM | POA: Diagnosis present

## 2020-09-22 DIAGNOSIS — F0280 Dementia in other diseases classified elsewhere without behavioral disturbance: Secondary | ICD-10-CM | POA: Diagnosis present

## 2020-09-22 DIAGNOSIS — W19XXXA Unspecified fall, initial encounter: Secondary | ICD-10-CM | POA: Diagnosis not present

## 2020-09-22 DIAGNOSIS — E039 Hypothyroidism, unspecified: Secondary | ICD-10-CM | POA: Diagnosis not present

## 2020-09-22 DIAGNOSIS — S72391S Other fracture of shaft of right femur, sequela: Secondary | ICD-10-CM | POA: Diagnosis not present

## 2020-09-22 DIAGNOSIS — Y92007 Garden or yard of unspecified non-institutional (private) residence as the place of occurrence of the external cause: Secondary | ICD-10-CM | POA: Diagnosis not present

## 2020-09-22 DIAGNOSIS — W1830XA Fall on same level, unspecified, initial encounter: Secondary | ICD-10-CM | POA: Diagnosis present

## 2020-09-22 DIAGNOSIS — Z9841 Cataract extraction status, right eye: Secondary | ICD-10-CM | POA: Diagnosis not present

## 2020-09-22 DIAGNOSIS — G8929 Other chronic pain: Secondary | ICD-10-CM | POA: Diagnosis not present

## 2020-09-22 DIAGNOSIS — Y92009 Unspecified place in unspecified non-institutional (private) residence as the place of occurrence of the external cause: Secondary | ICD-10-CM | POA: Diagnosis not present

## 2020-09-22 DIAGNOSIS — I6782 Cerebral ischemia: Secondary | ICD-10-CM | POA: Diagnosis not present

## 2020-09-22 DIAGNOSIS — S7290XA Unspecified fracture of unspecified femur, initial encounter for closed fracture: Secondary | ICD-10-CM

## 2020-09-22 DIAGNOSIS — N183 Chronic kidney disease, stage 3 unspecified: Secondary | ICD-10-CM

## 2020-09-22 DIAGNOSIS — Z79890 Hormone replacement therapy (postmenopausal): Secondary | ICD-10-CM

## 2020-09-22 DIAGNOSIS — M25551 Pain in right hip: Secondary | ICD-10-CM | POA: Diagnosis not present

## 2020-09-22 DIAGNOSIS — Z882 Allergy status to sulfonamides status: Secondary | ICD-10-CM | POA: Diagnosis not present

## 2020-09-22 DIAGNOSIS — R296 Repeated falls: Secondary | ICD-10-CM | POA: Diagnosis present

## 2020-09-22 DIAGNOSIS — N1832 Chronic kidney disease, stage 3b: Secondary | ICD-10-CM | POA: Diagnosis not present

## 2020-09-22 DIAGNOSIS — Z743 Need for continuous supervision: Secondary | ICD-10-CM | POA: Diagnosis not present

## 2020-09-22 DIAGNOSIS — R279 Unspecified lack of coordination: Secondary | ICD-10-CM | POA: Diagnosis not present

## 2020-09-22 DIAGNOSIS — E440 Moderate protein-calorie malnutrition: Secondary | ICD-10-CM | POA: Diagnosis present

## 2020-09-22 DIAGNOSIS — Z8781 Personal history of (healed) traumatic fracture: Secondary | ICD-10-CM

## 2020-09-22 DIAGNOSIS — R2689 Other abnormalities of gait and mobility: Secondary | ICD-10-CM | POA: Diagnosis not present

## 2020-09-22 DIAGNOSIS — S72451A Displaced supracondylar fracture without intracondylar extension of lower end of right femur, initial encounter for closed fracture: Principal | ICD-10-CM | POA: Diagnosis present

## 2020-09-22 DIAGNOSIS — S72301D Unspecified fracture of shaft of right femur, subsequent encounter for closed fracture with routine healing: Secondary | ICD-10-CM | POA: Diagnosis not present

## 2020-09-22 DIAGNOSIS — Z419 Encounter for procedure for purposes other than remedying health state, unspecified: Secondary | ICD-10-CM

## 2020-09-22 DIAGNOSIS — M79651 Pain in right thigh: Secondary | ICD-10-CM | POA: Diagnosis not present

## 2020-09-22 DIAGNOSIS — M9701XA Periprosthetic fracture around internal prosthetic right hip joint, initial encounter: Secondary | ICD-10-CM | POA: Diagnosis not present

## 2020-09-22 DIAGNOSIS — Z820 Family history of epilepsy and other diseases of the nervous system: Secondary | ICD-10-CM

## 2020-09-22 DIAGNOSIS — J3489 Other specified disorders of nose and nasal sinuses: Secondary | ICD-10-CM | POA: Diagnosis not present

## 2020-09-22 DIAGNOSIS — M25521 Pain in right elbow: Secondary | ICD-10-CM | POA: Diagnosis not present

## 2020-09-22 LAB — BASIC METABOLIC PANEL
Anion gap: 8 (ref 5–15)
BUN: 21 mg/dL (ref 8–23)
CO2: 25 mmol/L (ref 22–32)
Calcium: 9.1 mg/dL (ref 8.9–10.3)
Chloride: 105 mmol/L (ref 98–111)
Creatinine, Ser: 1.08 mg/dL — ABNORMAL HIGH (ref 0.44–1.00)
GFR, Estimated: 51 mL/min — ABNORMAL LOW (ref >60)
Glucose, Bld: 117 mg/dL — ABNORMAL HIGH (ref 70–99)
Potassium: 4.1 mmol/L (ref 3.5–5.1)
Sodium: 138 mmol/L (ref 135–145)

## 2020-09-22 LAB — TYPE AND SCREEN
ABO/RH(D): A NEG
Antibody Screen: NEGATIVE

## 2020-09-22 LAB — CBC WITH DIFFERENTIAL/PLATELET
Abs Immature Granulocytes: 0.09 10*3/uL — ABNORMAL HIGH (ref 0.00–0.07)
Basophils Absolute: 0 10*3/uL (ref 0.0–0.1)
Basophils Relative: 0 %
Eosinophils Absolute: 0.1 10*3/uL (ref 0.0–0.5)
Eosinophils Relative: 1 %
HCT: 44 % (ref 36.0–46.0)
Hemoglobin: 13.9 g/dL (ref 12.0–15.0)
Immature Granulocytes: 1 %
Lymphocytes Relative: 9 %
Lymphs Abs: 0.9 10*3/uL (ref 0.7–4.0)
MCH: 28.7 pg (ref 26.0–34.0)
MCHC: 31.6 g/dL (ref 30.0–36.0)
MCV: 90.9 fL (ref 80.0–100.0)
Monocytes Absolute: 0.6 10*3/uL (ref 0.1–1.0)
Monocytes Relative: 6 %
Neutro Abs: 7.9 10*3/uL — ABNORMAL HIGH (ref 1.7–7.7)
Neutrophils Relative %: 83 %
Platelets: 214 10*3/uL (ref 150–400)
RBC: 4.84 MIL/uL (ref 3.87–5.11)
RDW: 13 % (ref 11.5–15.5)
WBC: 9.5 10*3/uL (ref 4.0–10.5)
nRBC: 0 % (ref 0.0–0.2)

## 2020-09-22 LAB — PROTIME-INR
INR: 1 (ref 0.8–1.2)
Prothrombin Time: 12.7 seconds (ref 11.4–15.2)

## 2020-09-22 MED ORDER — MIRTAZAPINE 15 MG PO TABS
15.0000 mg | ORAL_TABLET | Freq: Every day | ORAL | Status: DC
  Administered 2020-09-22 – 2020-09-27 (×6): 15 mg via ORAL
  Filled 2020-09-22 (×6): qty 1

## 2020-09-22 MED ORDER — AMLODIPINE BESYLATE 5 MG PO TABS
5.0000 mg | ORAL_TABLET | Freq: Every day | ORAL | Status: DC
  Administered 2020-09-23 – 2020-09-27 (×5): 5 mg via ORAL
  Filled 2020-09-22 (×5): qty 1

## 2020-09-22 MED ORDER — LEVOTHYROXINE SODIUM 88 MCG PO TABS
88.0000 ug | ORAL_TABLET | Freq: Every day | ORAL | Status: DC
  Administered 2020-09-24 – 2020-09-28 (×5): 88 ug via ORAL
  Filled 2020-09-22 (×5): qty 1

## 2020-09-22 MED ORDER — GALANTAMINE HYDROBROMIDE ER 8 MG PO CP24
24.0000 mg | ORAL_CAPSULE | Freq: Every morning | ORAL | Status: DC
  Administered 2020-09-24 – 2020-09-28 (×5): 24 mg via ORAL
  Filled 2020-09-22 (×7): qty 3

## 2020-09-22 MED ORDER — CHLORHEXIDINE GLUCONATE 4 % EX LIQD
60.0000 mL | Freq: Once | CUTANEOUS | Status: DC
  Filled 2020-09-22: qty 60

## 2020-09-22 MED ORDER — MEMANTINE HCL ER 28 MG PO CP24
28.0000 mg | ORAL_CAPSULE | Freq: Every day | ORAL | Status: DC
  Administered 2020-09-22 – 2020-09-27 (×6): 28 mg via ORAL
  Filled 2020-09-22 (×7): qty 1

## 2020-09-22 MED ORDER — LACTATED RINGERS IV SOLN: INTRAVENOUS | Status: DC

## 2020-09-22 MED ORDER — HYDROCODONE-ACETAMINOPHEN 5-325 MG PO TABS
1.0000 | ORAL_TABLET | Freq: Four times a day (QID) | ORAL | Status: DC | PRN
  Administered 2020-09-23: 1 via ORAL
  Filled 2020-09-22: qty 1

## 2020-09-22 MED ORDER — TRANEXAMIC ACID-NACL 1000-0.7 MG/100ML-% IV SOLN
1000.0000 mg | INTRAVENOUS | Status: AC
  Administered 2020-09-23: 1000 mg via INTRAVENOUS
  Filled 2020-09-22: qty 100

## 2020-09-22 MED ORDER — CEFAZOLIN SODIUM-DEXTROSE 2-4 GM/100ML-% IV SOLN
2.0000 g | INTRAVENOUS | Status: AC
  Administered 2020-09-23: 2 g via INTRAVENOUS
  Filled 2020-09-22 (×2): qty 100

## 2020-09-22 MED ORDER — SENNOSIDES-DOCUSATE SODIUM 8.6-50 MG PO TABS: 1.0000 | ORAL_TABLET | Freq: Every evening | ORAL | Status: DC | PRN

## 2020-09-22 MED ORDER — HYDROMORPHONE HCL 1 MG/ML IJ SOLN
0.5000 mg | INTRAMUSCULAR | Status: DC | PRN
Start: 2020-09-22 — End: 2020-09-28
  Administered 2020-09-22 – 2020-09-24 (×3): 0.5 mg via INTRAVENOUS
  Filled 2020-09-22 (×3): qty 0.5

## 2020-09-22 MED ORDER — QUETIAPINE FUMARATE 25 MG PO TABS
25.0000 mg | ORAL_TABLET | Freq: Every day | ORAL | Status: DC
  Administered 2020-09-22 – 2020-09-27 (×6): 25 mg via ORAL
  Filled 2020-09-22 (×6): qty 1

## 2020-09-22 MED ORDER — POVIDONE-IODINE 10 % EX SWAB
2.0000 "application " | Freq: Once | CUTANEOUS | Status: AC
  Administered 2020-09-23: 2 via TOPICAL

## 2020-09-22 NOTE — ED Provider Notes (Addendum)
Julia [name] Provider Note   CSN: [id number] Arrival date & time: 09/22/20  1023     History Chief Complaint  Patient presents with  . Fall  . Hip Injury    Julia [name] is a 86 y.o. female.  HPI    86 year old female with history of dementia comes in with chief complaint of fall.  Patient here with her daughter, who also has the medical POA.  Per daughter, patient had a mechanical fall prior to ED arrival.  The reviewed on the home video, that patient was heading towards trash can and fell.  Patient was outside for 15 minutes until mailman came by and assisted her up.  The daughter reports that patient has had few falls where she was unable to get up, but as soon as she was stood up she was able to ambulate.  With this fall she had some hard time bearing weight on the right side.  Patient has had fractures to her right hip before, last repair was in 2018.  Patient is not on any blood thinners.  She denies any chest pain, shortness of breath, new neurologic symptoms such as vision change, loss of consciousness, new numbness or weakness.  Past Medical History:  Diagnosis Date  . Alzheimer's dementia ([place])   . Anxiety   . Concussion with no loss of consciousness 05/14/2008   Qualifier: Diagnosis of  By: Arnoldo Morale MD, Balinda Quails   . Dementia [facility])   . GERD (gastroesophageal reflux disease)   . History of colonic polyps   . Hx of echocardiogram    Echo 4/16:  EF 55-60%, mild LVH  . Hyperlipidemia   . Hypertension   . Hypothyroidism   . Osteopenia   . Right forearm fracture 2011  . Thyroid disease    hypothyroidism    Patient Active Problem List   Diagnosis Date Noted  . Closed intertrochanteric fracture of hip, right, initial encounter ([place]) 03/27/2017  . Mild dementia ([place]) 03/27/2017  . Leukocytosis 03/27/2017  . Closed fracture of right hip ([place])   . Closed nondisplaced fracture of base of neck of right femur ([place]) 11/07/2016   . Sinus bradycardia 09/05/2014  . Junctional escape rhythm 09/05/2014  . Alzheimer's dementia ([place]) 04/21/2014  . Overactive bladder 04/21/2014  . Hypothyroidism, acquired 11/21/2013  . PALPITATIONS, RECURRENT 12/09/2008  . Hyperlipidemia 04/18/2007  . Essential hypertension 12/11/2006  . GERD 12/11/2006  . Osteopenia 12/11/2006  . History of colonic polyps 12/11/2006    Past Surgical History:  Procedure Laterality Date  . ABDOMINAL HYSTERECTOMY  2009  . BILATERAL SALPINGOOPHORECTOMY    . bladder tack    . CATARACT EXTRACTION Right at least a year ago  . FEMUR IM NAIL Right 03/27/2017   Procedure: INTRAMEDULLARY (IM) NAIL FEMORAL;  Surgeon: Renette Butters, MD;  Location: [place];  Service: Orthopedics;  Laterality: Right;  . HIP PINNING,CANNULATED Right 11/07/2016   Procedure: RIGHT CANNULATED HIP PINNING;  Surgeon: Renette Butters, MD;  Location: [place];  Service: Orthopedics;  Laterality: Right;  . INCISION AND DRAINAGE OF WOUND Left 08/04/2015   Procedure: DEBRIDEMENT DISTAL INTERPHALANGEAL LEFT INDEX FINGER;  Surgeon: Daryll Brod, MD;  Location: [place];  Service: Orthopedics;  Laterality: Left;  Marland Kitchen MASS EXCISION N/A 08/04/2015   Procedure: EXCISION MUCOID CYST;  Surgeon: Daryll Brod, MD;  Location: [place];  Service: Orthopedics;  Laterality: N/A;  . MASS EXCISION Left 11/24/2015   Procedure: EXCISION RECURRENT  MUCOID CYST LEFT INDEX  FINGER;  Surgeon: Daryll Brod, MD;  Location: [place];  Service: Orthopedics;  Laterality: Left;  . ROTATOR CUFF REPAIR     right shoulder  . surgical repair to fx left wrist    . TONSILLECTOMY       OB History   No obstetric history on file.     Family History  Problem Relation Age of Onset  . Hypertension Mother   . COPD Mother   . Tuberculosis Mother        x 2  . Lung cancer Father   . Stomach cancer Father   . Heart disease Brother   . Drug abuse Maternal Uncle   . Cancer  Maternal Uncle   . Alzheimer's disease Maternal Aunt   . Colon cancer Neg Hx   . Esophageal cancer Neg Hx   . Rectal cancer Neg Hx   . Heart attack Neg Hx   . Stroke Neg Hx     Social History   Tobacco Use  . Smoking status: Never Smoker  . Smokeless tobacco: Never Used  Substance Use Topics  . Alcohol use: No    Alcohol/week: 0.0 standard drinks  . Drug use: No    Home Medications Prior to Admission medications   Medication Sig Start Date End Date Taking? Authorizing Provider  acetaminophen (TYLENOL) 500 MG tablet Take 1,000 mg by mouth in the morning and at bedtime.   Yes [provider]  amLODipine (NORVASC) 5 MG tablet Take 1 tablet (5 mg total) by mouth daily. 01/07/20  Yes Nafziger, Tommi Rumps, NP  galantamine (RAZADYNE ER) 24 MG 24 hr capsule TAKE 1 CAPSULE(24 MG) BY MOUTH DAILY Patient taking differently: Take 24 mg by mouth in the morning. 07/08/20  Yes Nafziger, Tommi Rumps, NP  levothyroxine (SYNTHROID) 88 MCG tablet Take 1 tablet (88 mcg total) by mouth daily before breakfast. 01/08/20 04/07/20 Yes Nafziger, Tommi Rumps, NP  memantine (NAMENDA XR) 28 MG CP24 24 hr capsule TAKE 1 CAPSULE(28 MG) BY MOUTH DAILY Patient taking differently: Take 28 mg by mouth at bedtime. 07/08/20  Yes Nafziger, Tommi Rumps, NP  mirtazapine (REMERON) 15 MG tablet TAKE 1 TABLET BY MOUTH AT BEDTIME Patient taking differently: Take 15 mg by mouth at bedtime. 04/07/20  Yes Nafziger, Tommi Rumps, NP  QUEtiapine (SEROQUEL) 25 MG tablet TAKE 1 TABLET BY MOUTH AT BEDTIME Patient taking differently: Take 25 mg by mouth at bedtime. 04/07/20  Yes Nafziger, Tommi Rumps, NP  Besifloxacin HCl 0.6 % SUSP Place 1 drop into both eyes at bedtime. 09/07/15   [provider]  latanoprost (XALATAN) 0.005 % ophthalmic solution Place 1 drop into both eyes at bedtime. Patient not taking: Reported on 09/22/2020    [provider]  Multiple Vitamins-Minerals (CENTRUM SILVER PO) Take 1 tablet by mouth daily.    [provider]   polyethylene glycol (MIRALAX / GLYCOLAX) packet Take 17 g by mouth daily as needed for mild constipation. Reported on 10/17/2015    [provider]    Allergies    Entex and Sulfonamide derivatives  Review of Systems   Review of Systems  Constitutional: Positive for activity change.  Respiratory: Negative for shortness of breath.   Cardiovascular: Negative for chest pain.  Gastrointestinal: Negative for nausea and vomiting.  Musculoskeletal: Positive for arthralgias.  Allergic/Immunologic: Negative for immunocompromised state.  Hematological: Does not bruise/bleed easily.  All other systems reviewed and are negative.   Physical Exam Updated Vital Signs BP (!) 172/79  Pulse 88   Temp 97.6 F (36.4 C) (Oral)   Resp 17   Ht 5\' 5"  (1.651 m)   Wt 54.4 kg   SpO2 94%   BMI 19.97 kg/m   Physical Exam Vitals and nursing note reviewed.  Constitutional:      Appearance: She is well-developed.  HENT:     Head: Normocephalic and atraumatic.  Cardiovascular:     Rate and Rhythm: Normal rate.  Pulmonary:     Effort: Pulmonary effort is normal.  Abdominal:     General: Bowel sounds are normal.  Musculoskeletal:        General: Tenderness present. No deformity.     Cervical back: Normal range of motion and neck supple.  Skin:    General: Skin is warm and dry.  Neurological:     Mental Status: She is alert and oriented to person, place, and time.     ED Results / Procedures / Treatments   Labs (all labs ordered are listed, but only abnormal results are displayed) Labs Reviewed  BASIC METABOLIC PANEL - Abnormal; Notable for the following components:      Result Value   Glucose, Bld 117 (*)    Creatinine, Ser 1.08 (*)    GFR, Estimated 51 (*)    All other components within normal limits  CBC WITH DIFFERENTIAL/PLATELET - Abnormal; Notable for the following components:   Neutro Abs 7.9 (*)    Abs Immature Granulocytes 0.09 (*)    All other components within  normal limits  PROTIME-INR  TYPE AND SCREEN    EKG EKG Interpretation  Date/Time:  Tuesday September 22 2020 10:35:13 EDT Ventricular Rate:  68 PR Interval:  143 QRS Duration: 84 QT Interval:  437 QTC Calculation: 465 R Axis:   -41 Text Interpretation: Sinus rhythm Left axis deviation Borderline repolarization abnormality No acute changes No significant change since last tracing Confirmed by Varney Biles ([id number]) on 09/22/2020 11:29:05 AM   Radiology DG Chest 1 View  Result Date: 09/22/2020 CLINICAL DATA:  Fall. EXAM: CHEST  1 VIEW COMPARISON:  02/19/2018. FINDINGS: Mediastinum hilar structures normal. Cardiomegaly. No pulmonary venous congestion. No focal infiltrate. No pleural effusion or pneumothorax. Thoracic spine scoliosis and degenerative change. No displaced rib fracture. IMPRESSION: 1. Cardiomegaly. No pulmonary venous congestion. No focal infiltrate. 2. Mild degenerative change thoracic spine. No evidence of displaced rib fracture. No pneumothorax. Electronically Signed   By: Marcello Moores  Register   On: 09/22/2020 11:54   CT HEAD WO CONTRAST  Result Date: 09/22/2020 CLINICAL DATA:  Multiple falls EXAM: CT HEAD WITHOUT CONTRAST TECHNIQUE: Contiguous axial images were obtained from the base of the skull through the vertex without intravenous contrast. COMPARISON:  February 19, 2018 FINDINGS: Brain: No evidence of acute large vascular territory infarction, hemorrhage, hydrocephalus, extra-axial collection or mass lesion/mass effect. Stable moderate to severe global parenchymal volume loss with ex vacuo dilatation of ventricular system. Similar moderate to severe burden of chronic small vessel ischemic disease. Vascular: Dolichoectatic intracranial vessels stable from prior imaging seen with chronic hypertension. Scattered atherosclerotic calcifications of the internal carotid and vertebral arteries at the skull base. Skull: Normal. Negative for fracture or focal lesion. Sinuses/Orbits:  Opacified right ethmoid air cells, otherwise the paranasal sinuses and mastoid air cells are predominantly clear. Other: None IMPRESSION: 1. No acute intracranial findings. 2. Stable moderate to severe global parenchymal volume loss and similar chronic small vessel ischemic disease. Electronically Signed   By: Dahlia Bailiff MD   On: 09/22/2020 15:10  DG Hip Unilat With Pelvis 2-3 Views Right  Result Date: 09/22/2020 CLINICAL DATA:  Fall.  Right hip pain EXAM: DG HIP (WITH OR WITHOUT PELVIS) 2-3V RIGHT COMPARISON:  03/27/2017 FINDINGS: Bones are diffusely osteopenic. Previous ORIF proximal right femur fracture. IM nail and distal interlocking hip screw are identified. No signs of dislocation or fracture. The distal portion of the IM nail is not included on this current series of films. IMPRESSION: 1. No acute findings. 2. Previous ORIF of proximal right femur fracture. Electronically Signed   By: Kerby Moors M.D.   On: 09/22/2020 11:54   DG Femur Min 2 Views Right  Result Date: 09/22/2020 CLINICAL DATA:  pain, fall EXAM: RIGHT FEMUR 2 VIEWS COMPARISON:  Right hip radiograph 03/26/2017, fluoroscopic images 03/27/2017 FINDINGS: There is a posteriorly displaced periprosthetic mid to distal femur fracture, with likely intra-articular involvement of the knee joint. Intramedullary fixation hardware is intact but displaced due to the fracture. Old healed proximal femur fracture. Adjacent soft tissue swelling. Laxity of the patellar tendon in part due to knee hyperextension. IMPRESSION: Posteriorly displaced periprosthetic fracture of the mid to distal femur, with likely intra-articular extension to the knee joint. Electronically Signed   By: Maurine Simmering   On: 09/22/2020 15:17    Procedures Procedures   Medications Ordered in ED Medications - No data to display  ED Course  I have reviewed the triage vital signs and the nursing notes.  Pertinent labs & imaging results that were available during my  care of the patient were reviewed by me and considered in my medical decision making (see chart for details).  Clinical Course as of 09/22/20 1730  Tue Sep 22, 2020  1325 Unable to ambulate. Will require CT scan to assess for occult fracture. [AN]    Clinical Course User Index [AN] Varney Biles, MD   MDM Rules/Calculators/A&P                          86 year old comes in a chief complaint of fall.  She has history of dementia and had a fall yesterday as well.  She lives with her husband and is fairly mobile according to the daughter.  Patient has had hip replacement on the right side in 2018 and from the fall today appears to be having discomfort in that area.  From the fall perspective, will get CT head to ensure there is no intra cranial bleed.  C-spine has been cleared clinically.  X-ray ordered to ensure there is no pneumothorax and for preop clearance.  High suspicion for hip fracture, therefore x-rays ordered.  5:30 PM Patient's hip x-ray was normal.  We reassessed her and noted that she had some swelling and discomfort over the distal end midshaft of the femur.  X-ray of the femur ordered and it confirms a fracture.  [facility] has been paged, we have asked him to be repaged, as patient will need admission to the hospital in their assessment. Final Clinical Impression(s) / ED Diagnoses Final diagnoses:  Closed fracture of shaft of right femur, unspecified fracture morphology, initial encounter [facility])    Rx / Christmas Orders ED Discharge Orders    None       Varney Biles, MD 09/22/20 [place], [place], MD 09/22/20 1730

## 2020-09-22 NOTE — ED Notes (Signed)
Pt was sat up in bed with help of pt daughter and attempted to help pt stand with daughters help but pt was in unbearable pain and could not stand with help.

## 2020-09-22 NOTE — ED Triage Notes (Signed)
BIB EMS for fall. Pt fell on her right hip outside in the gravel. Mailman found pt and probably been down for 15-20 minutes. Pt also fell last night in her house but she was able to get up and ambulate normally.  On both falls pt did not hit heat, no LOC and pt is not on any thinners.Hx of fractured femurs 2x and a rod was placed in Sept. 2019.  Pt is very anxious but otherwise mentation is at baseline per daughter.

## 2020-09-22 NOTE — H&P (Signed)
History and Physical    Julia [name] WNU:[id number] DOB: 07/23/1934 DOA: 09/22/2020  PCP: Dorothyann Peng, NP   Patient coming from: Home  Chief Complaint: Leg pain after fall at home  HPI: Julia [name] is a 86 y.o. female with medical history significant for CKD 3, hypothyroidism, HTN, Alzheimer's dementia who presents by EMS after a fall at home.  Patient is not a good historian and cannot give details of what happened.  Daughter is at bedside who provides the history.  Daughter is POA.  Patient was at home with her husband and had walked down the gravel driveway toward her mailbox when she fell.  Family has cameras up around the house but she was in an area that was a blind spot to the camera so the fall was not seen.  She laid the ground for 15 to 20 minutes when the mailman came by the house and saw her and called for help.  Patient complained of pain in her right leg and was not able to stand up and bear weight.  She denies any numbness or weakness of her right leg or foot.  Pain is exacerbated by movement of her leg.  She has a history of right hip fracture in 2018 with repair.  Daughter reports that she initially broke her right hip in June 2018 and fell again in September 2018 fracturing around the hip prosthesis and was reoperated on.  She generally has some chronic hip pain and takes Tylenol for this twice a day.  She normally ambulates and does not use a walker or cane at home. Patient denies tobacco, alcohol use  ED Course: Julia [name] is found to have a displaced periprosthetic fracture in the mid to distal femur which extends into the intra-articular surface of the knee joint.  Hemodynamically stable.  Labs are unremarkable.  Hospitalist service asked to admit for further management  Review of Systems:  Accurate review of system cannot be obtained secondary to dementia  Past Medical History:  Diagnosis Date  . Alzheimer's dementia ([place])   . Anxiety   . Concussion with  no loss of consciousness 05/14/2008   Qualifier: Diagnosis of  By: Arnoldo Morale MD, Balinda Quails   . Dementia [facility])   . GERD (gastroesophageal reflux disease)   . History of colonic polyps   . Hx of echocardiogram    Echo 4/16:  EF 55-60%, mild LVH  . Hyperlipidemia   . Hypertension   . Hypothyroidism   . Osteopenia   . Right forearm fracture 2011  . Thyroid disease    hypothyroidism    Past Surgical History:  Procedure Laterality Date  . ABDOMINAL HYSTERECTOMY  2009  . BILATERAL SALPINGOOPHORECTOMY    . bladder tack    . CATARACT EXTRACTION Right at least a year ago  . FEMUR IM NAIL Right 03/27/2017   Procedure: INTRAMEDULLARY (IM) NAIL FEMORAL;  Surgeon: Renette Butters, MD;  Location: [place];  Service: Orthopedics;  Laterality: Right;  . HIP PINNING,CANNULATED Right 11/07/2016   Procedure: RIGHT CANNULATED HIP PINNING;  Surgeon: Renette Butters, MD;  Location: [place];  Service: Orthopedics;  Laterality: Right;  . INCISION AND DRAINAGE OF WOUND Left 08/04/2015   Procedure: DEBRIDEMENT DISTAL INTERPHALANGEAL LEFT INDEX FINGER;  Surgeon: Daryll Brod, MD;  Location: [place];  Service: Orthopedics;  Laterality: Left;  Marland Kitchen MASS EXCISION N/A 08/04/2015   Procedure: EXCISION MUCOID CYST;  Surgeon: Daryll Brod, MD;  Location: [place];  Service: Orthopedics;  Laterality: N/A;  . MASS EXCISION Left 11/24/2015   Procedure: EXCISION RECURRENT  MUCOID CYST LEFT INDEX  FINGER;  Surgeon: Daryll Brod, MD;  Location: [place];  Service: Orthopedics;  Laterality: Left;  . ROTATOR CUFF REPAIR     right shoulder  . surgical repair to fx left wrist    . TONSILLECTOMY      Social History  reports that she has never smoked. She has never used smokeless tobacco. She reports that she does not drink alcohol and does not use drugs.  Allergies  Allergen Reactions  . Entex Other (See Comments)    Reaction unknown   . Sulfonamide Derivatives Other (See Comments)     Reaction is not known- allergy is from years ago    Family History  Problem Relation Age of Onset  . Hypertension Mother   . COPD Mother   . Tuberculosis Mother        x 2  . Lung cancer Father   . Stomach cancer Father   . Heart disease Brother   . Drug abuse Maternal Uncle   . Cancer Maternal Uncle   . Alzheimer's disease Maternal Aunt   . Colon cancer Neg Hx   . Esophageal cancer Neg Hx   . Rectal cancer Neg Hx   . Heart attack Neg Hx   . Stroke Neg Hx      Prior to Admission medications   Medication Sig Start Date End Date Taking? Authorizing Provider  acetaminophen (TYLENOL) 500 MG tablet Take 1,000 mg by mouth in the morning and at bedtime.   Yes [provider]  amLODipine (NORVASC) 5 MG tablet Take 1 tablet (5 mg total) by mouth daily. 01/07/20  Yes Nafziger, Tommi Rumps, NP  galantamine (RAZADYNE ER) 24 MG 24 hr capsule TAKE 1 CAPSULE(24 MG) BY MOUTH DAILY Patient taking differently: Take 24 mg by mouth in the morning. 07/08/20  Yes Nafziger, Tommi Rumps, NP  levothyroxine (SYNTHROID) 88 MCG tablet Take 1 tablet (88 mcg total) by mouth daily before breakfast. 01/08/20 04/07/20 Yes Nafziger, Tommi Rumps, NP  memantine (NAMENDA XR) 28 MG CP24 24 hr capsule TAKE 1 CAPSULE(28 MG) BY MOUTH DAILY Patient taking differently: Take 28 mg by mouth at bedtime. 07/08/20  Yes Nafziger, Tommi Rumps, NP  mirtazapine (REMERON) 15 MG tablet TAKE 1 TABLET BY MOUTH AT BEDTIME Patient taking differently: Take 15 mg by mouth at bedtime. 04/07/20  Yes Nafziger, Tommi Rumps, NP  QUEtiapine (SEROQUEL) 25 MG tablet TAKE 1 TABLET BY MOUTH AT BEDTIME Patient taking differently: Take 25 mg by mouth at bedtime. 04/07/20  Yes Nafziger, Tommi Rumps, NP  Besifloxacin HCl 0.6 % SUSP Place 1 drop into both eyes at bedtime. 09/07/15   [provider]  latanoprost (XALATAN) 0.005 % ophthalmic solution Place 1 drop into both eyes at bedtime. Patient not taking: Reported on 09/22/2020    [provider]  Multiple Vitamins-Minerals  (CENTRUM SILVER PO) Take 1 tablet by mouth daily.    [provider]  polyethylene glycol (MIRALAX / GLYCOLAX) packet Take 17 g by mouth daily as needed for mild constipation. Reported on 10/17/2015    [provider]    Physical Exam: Vitals:   09/22/20 1830 09/22/20 1839 09/22/20 1900 09/22/20 1915  BP: (!) 156/79  (!) 153/83 (!) 149/94  Pulse: 96  92 96  Resp: (!) 22  (!) 22 (!) 22  Temp:  98.2 F (36.8 C)    TempSrc:  Oral    SpO2:  94%  95% 97%  Weight:      Height:        Constitutional: NAD, calm, comfortable Vitals:   09/22/20 1830 09/22/20 1839 09/22/20 1900 09/22/20 1915  BP: (!) 156/79  (!) 153/83 (!) 149/94  Pulse: 96  92 96  Resp: (!) 22  (!) 22 (!) 22  Temp:  98.2 F (36.8 C)    TempSrc:  Oral    SpO2: 94%  95% 97%  Weight:      Height:       General: WDWN, Alert and oriented to self and place.  Eyes: EOMI, PERRL, conjunctivae normal.  Sclera nonicteric HENT:  [place]/AT, external ears normal.  Nares patent without epistasis.  Mucous membranes are moist  Neck: Soft, normal range of motion, supple, no masses, no thyromegaly.  Trachea midline Respiratory: clear to auscultation bilaterally, no wheezing, no crackles. Normal respiratory effort. No accessory muscle use.  Cardiovascular: Regular rate and rhythm, no murmurs / rubs / gallops. No extremity edema. 2+ pedal pulses.  Abdomen: Soft, no tenderness, nondistended, no rebound or guarding.  No masses palpated. Bowel sounds normoactive Musculoskeletal: LROM of right leg. Right knee with mild swelling. Right distal femur and knee joint tender to palpation. Right leg slightly shorter than left and internally rotated slightly. no cyanosis. Normal muscle tone.  Skin: Warm, dry, intact no rashes, lesions, ulcers. No induration Neurologic: CN 2-12 grossly intact.  Normal speech.  Sensation intact Psychiatric: Normal mood.    Labs on Admission: I have personally reviewed following labs and imaging  studies  CBC: Recent Labs  Lab 09/22/20 1057  WBC 9.5  NEUTROABS 7.9*  HGB 13.9  HCT 44.0  MCV 90.9  PLT 578    Basic Metabolic Panel: Recent Labs  Lab 09/22/20 1057  NA 138  K 4.1  CL 105  CO2 25  GLUCOSE 117*  BUN 21  CREATININE 1.08*  CALCIUM 9.1    GFR: Estimated Creatinine Clearance: 33.9 mL/min (A) (by C-G formula based on SCr of 1.08 mg/dL (H)).  Liver Function Tests: No results for input(s): AST, ALT, ALKPHOS, BILITOT, PROT, ALBUMIN in the last 168 hours.  Urine analysis:    Component Value Date/Time   COLORURINE YELLOW 02/20/2018 0055   APPEARANCEUR HAZY (A) 02/20/2018 0055   LABSPEC 1.014 02/20/2018 0055   PHURINE 6.0 02/20/2018 0055   GLUCOSEU NEGATIVE 02/20/2018 0055   HGBUR NEGATIVE 02/20/2018 0055   HGBUR negative 03/02/2010 1020   BILIRUBINUR NEGATIVE 02/20/2018 0055   BILIRUBINUR n 07/09/2015 1041   KETONESUR 20 (A) 02/20/2018 0055   PROTEINUR NEGATIVE 02/20/2018 0055   UROBILINOGEN 1.0 07/09/2015 1041   UROBILINOGEN 1.0 09/05/2014 0400   NITRITE NEGATIVE 02/20/2018 0055   LEUKOCYTESUR NEGATIVE 02/20/2018 0055    Radiological Exams on Admission: DG Chest 1 View  Result Date: 09/22/2020 CLINICAL DATA:  Fall. EXAM: CHEST  1 VIEW COMPARISON:  02/19/2018. FINDINGS: Mediastinum hilar structures normal. Cardiomegaly. No pulmonary venous congestion. No focal infiltrate. No pleural effusion or pneumothorax. Thoracic spine scoliosis and degenerative change. No displaced rib fracture. IMPRESSION: 1. Cardiomegaly. No pulmonary venous congestion. No focal infiltrate. 2. Mild degenerative change thoracic spine. No evidence of displaced rib fracture. No pneumothorax. Electronically Signed   By: Marcello Moores  Register   On: 09/22/2020 11:54   CT HEAD WO CONTRAST  Result Date: 09/22/2020 CLINICAL DATA:  Multiple falls EXAM: CT HEAD WITHOUT CONTRAST TECHNIQUE: Contiguous axial images were obtained from the base of the skull through the vertex without intravenous  contrast. COMPARISON:  February 19, 2018 FINDINGS: Brain: No evidence of acute large vascular territory infarction, hemorrhage, hydrocephalus, extra-axial collection or mass lesion/mass effect. Stable moderate to severe global parenchymal volume loss with ex vacuo dilatation of ventricular system. Similar moderate to severe burden of chronic small vessel ischemic disease. Vascular: Dolichoectatic intracranial vessels stable from prior imaging seen with chronic hypertension. Scattered atherosclerotic calcifications of the internal carotid and vertebral arteries at the skull base. Skull: Normal. Negative for fracture or focal lesion. Sinuses/Orbits: Opacified right ethmoid air cells, otherwise the paranasal sinuses and mastoid air cells are predominantly clear. Other: None IMPRESSION: 1. No acute intracranial findings. 2. Stable moderate to severe global parenchymal volume loss and similar chronic small vessel ischemic disease. Electronically Signed   By: Dahlia Bailiff MD   On: 09/22/2020 15:10   DG Hip Unilat With Pelvis 2-3 Views Right  Result Date: 09/22/2020 CLINICAL DATA:  Fall.  Right hip pain EXAM: DG HIP (WITH OR WITHOUT PELVIS) 2-3V RIGHT COMPARISON:  03/27/2017 FINDINGS: Bones are diffusely osteopenic. Previous ORIF proximal right femur fracture. IM nail and distal interlocking hip screw are identified. No signs of dislocation or fracture. The distal portion of the IM nail is not included on this current series of films. IMPRESSION: 1. No acute findings. 2. Previous ORIF of proximal right femur fracture. Electronically Signed   By: Kerby Moors M.D.   On: 09/22/2020 11:54   DG Femur Min 2 Views Right  Result Date: 09/22/2020 CLINICAL DATA:  pain, fall EXAM: RIGHT FEMUR 2 VIEWS COMPARISON:  Right hip radiograph 03/26/2017, fluoroscopic images 03/27/2017 FINDINGS: There is a posteriorly displaced periprosthetic mid to distal femur fracture, with likely intra-articular involvement of the knee joint.  Intramedullary fixation hardware is intact but displaced due to the fracture. Old healed proximal femur fracture. Adjacent soft tissue swelling. Laxity of the patellar tendon in part due to knee hyperextension. IMPRESSION: Posteriorly displaced periprosthetic fracture of the mid to distal femur, with likely intra-articular extension to the knee joint. Electronically Signed   By: Maurine Simmering   On: 09/22/2020 15:17    EKG: Independently reviewed.  EKG shows normal sinus rhythm with no acute ST elevation or depression.  QTc 465  Assessment/Plan   Closed fracture of shaft of right femur  Julia [name] is admitted to Med/Surg floor.  Orthopedic surgery has been consulted and evaluated. Planning for repair of right femur fracture tomorrow.  NPO after midnight.  Pain control provided.  IVF hydration with LR at 75 ml/hr overnight    Essential hypertension Continue home dose of norvasc. Monitor BP.     CKD (chronic kidney disease) stage 3 A Stable.  Recheck renal function and electrolytes in am    Alzheimer's dementia  Continue home medications of Namenda and Razadyne     DVT prophylaxis: SCD's for DVT prophylaxis  Code Status:   Full code  Family Communication:  Diagnosis and plan discussed with patient and her daughter who is at bedside.  Daughter who is the POA verbalized understanding and agrees with plan.  Further recommendations to follow as clinically indicated Disposition Plan:   Patient is from:  Home  Anticipated DC to:  SNF for rehab  Anticipated DC date:  Anticipate more than 2 midnight stay hospital  Anticipated DC barriers: Barriers to discharge will be ranging excepting rehabilitation bed  Consults called:  Orthopedic surgery Admission status:  Inpatient   Yevonne Aline Imari Reen MD [facility]  How to contact the [facility] Attending or Consulting provider  7A - 7P or covering provider during after hours [place], for this patient?   1. Check the care team in [facility] and look for  a) attending/consulting [facility] provider listed and b) the [facility] team listed 2. Log into [email or web address] and use [facility]'s universal password to access. If you do not have the password, please contact the hospital operator. 3. Locate the [facility] provider you are looking for under [facility] and page to a number that you can be directly reached. 4. If you still have difficulty reaching the provider, please page the [facility] (Director on Call) for the Hospitalists listed on amion for assistance.  09/22/2020, 8:01 PM

## 2020-09-22 NOTE — Consult Note (Signed)
Orthopaedic Trauma Service Consultation  Reason for Consult: right femur fracture peri-implant Referring Physician: Varney Biles MD  Julia [name] is an 86 y.o. female.  HPI: Patient fell in her drive, assisted by passer by. Inability to bear weight or more right leg well afterward with valgus deformity and tenderness. Patient denies sensory loss or other injury other than minor right elbow abrasions. Lives with her husband at whom who is of sound mind and infirm body per daughter's report, while patient's dementia has not prevented her from assisting him physically. They generally have help during the daytime. Patient does not use assistive walking aids but probably needs them for balance. I spoke with Dr. Fredonia Highland and reviewed films with him but he wishes myself or Dr. Doreatha Martin to proceed with management given the complexity.  Past Medical History:  Diagnosis Date  . Alzheimer's dementia ([place])   . Anxiety   . Concussion with no loss of consciousness 05/14/2008   Qualifier: Diagnosis of  By: Arnoldo Morale MD, Balinda Quails   . Dementia [facility])   . GERD (gastroesophageal reflux disease)   . History of colonic polyps   . Hx of echocardiogram    Echo 4/16:  EF 55-60%, mild LVH  . Hyperlipidemia   . Hypertension   . Hypothyroidism   . Osteopenia   . Right forearm fracture 2011  . Thyroid disease    hypothyroidism    Past Surgical History:  Procedure Laterality Date  . ABDOMINAL HYSTERECTOMY  2009  . BILATERAL SALPINGOOPHORECTOMY    . bladder tack    . CATARACT EXTRACTION Right at least a year ago  . FEMUR IM NAIL Right 03/27/2017   Procedure: INTRAMEDULLARY (IM) NAIL FEMORAL;  Surgeon: Renette Butters, MD;  Location: [place];  Service: Orthopedics;  Laterality: Right;  . HIP PINNING,CANNULATED Right 11/07/2016   Procedure: RIGHT CANNULATED HIP PINNING;  Surgeon: Renette Butters, MD;  Location: [place];  Service: Orthopedics;  Laterality: Right;  . INCISION AND DRAINAGE OF WOUND Left  08/04/2015   Procedure: DEBRIDEMENT DISTAL INTERPHALANGEAL LEFT INDEX FINGER;  Surgeon: Daryll Brod, MD;  Location: [place];  Service: Orthopedics;  Laterality: Left;  Marland Kitchen MASS EXCISION N/A 08/04/2015   Procedure: EXCISION MUCOID CYST;  Surgeon: Daryll Brod, MD;  Location: [place];  Service: Orthopedics;  Laterality: N/A;  . MASS EXCISION Left 11/24/2015   Procedure: EXCISION RECURRENT  MUCOID CYST LEFT INDEX  FINGER;  Surgeon: Daryll Brod, MD;  Location: [place];  Service: Orthopedics;  Laterality: Left;  . ROTATOR CUFF REPAIR     right shoulder  . surgical repair to fx left wrist    . TONSILLECTOMY      Family History  Problem Relation Age of Onset  . Hypertension Mother   . COPD Mother   . Tuberculosis Mother        x 2  . Lung cancer Father   . Stomach cancer Father   . Heart disease Brother   . Drug abuse Maternal Uncle   . Cancer Maternal Uncle   . Alzheimer's disease Maternal Aunt   . Colon cancer Neg Hx   . Esophageal cancer Neg Hx   . Rectal cancer Neg Hx   . Heart attack Neg Hx   . Stroke Neg Hx     Social History:  reports that she has never smoked. She has never used smokeless tobacco. She reports that she does not drink alcohol and does not use drugs.  Allergies:  Allergies  Allergen Reactions  . Entex Other (See Comments)    Reaction unknown   . Sulfonamide Derivatives Other (See Comments)    Reaction is not known- allergy is from years ago    Medications: Reviewed and in chart.  Results for orders placed or performed during the hospital encounter of 09/22/20 (from the past 48 hour(s))  Basic metabolic panel     Status: Abnormal   Collection Time: 09/22/20 10:57 AM  Result Value Ref Range   Sodium 138 135 - 145 mmol/L   Potassium 4.1 3.5 - 5.1 mmol/L   Chloride 105 98 - 111 mmol/L   CO2 25 22 - 32 mmol/L   Glucose, Bld 117 (H) 70 - 99 mg/dL    Comment: Glucose reference range applies only to samples taken  after fasting for at least 8 hours.   BUN 21 8 - 23 mg/dL   Creatinine, Ser 1.08 (H) 0.44 - 1.00 mg/dL   Calcium 9.1 8.9 - 10.3 mg/dL   GFR, Estimated 51 (L) >60 mL/min    Comment: (NOTE) Calculated using the CKD-EPI Creatinine Equation (2021)    Anion gap 8 5 - 15    Comment: Performed at [facility] [address] [place], [place] [address]  CBC WITH DIFFERENTIAL     Status: Abnormal   Collection Time: 09/22/20 10:57 AM  Result Value Ref Range   WBC 9.5 4.0 - 10.5 K/uL   RBC 4.84 3.87 - 5.11 MIL/uL   Hemoglobin 13.9 12.0 - 15.0 g/dL   HCT 44.0 36.0 - 46.0 %   MCV 90.9 80.0 - 100.0 fL   MCH 28.7 26.0 - 34.0 pg   MCHC 31.6 30.0 - 36.0 g/dL   RDW 13.0 11.5 - 15.5 %   Platelets 214 150 - 400 K/uL   nRBC 0.0 0.0 - 0.2 %   Neutrophils Relative % 83 %   Neutro Abs 7.9 (H) 1.7 - 7.7 K/uL   Lymphocytes Relative 9 %   Lymphs Abs 0.9 0.7 - 4.0 K/uL   Monocytes Relative 6 %   Monocytes Absolute 0.6 0.1 - 1.0 K/uL   Eosinophils Relative 1 %   Eosinophils Absolute 0.1 0.0 - 0.5 K/uL   Basophils Relative 0 %   Basophils Absolute 0.0 0.0 - 0.1 K/uL   Immature Granulocytes 1 %   Abs Immature Granulocytes 0.09 (H) 0.00 - 0.07 K/uL    Comment: Performed at [facility] [address], [place], [place] [address]  Protime-INR     Status: None   Collection Time: 09/22/20 10:57 AM  Result Value Ref Range   Prothrombin Time 12.7 11.4 - 15.2 seconds   INR 1.0 0.8 - 1.2    Comment: (NOTE) INR goal varies based on device and disease states. Performed at [facility], [address] [place], [place] [address]   Type and screen [place]     Status: None   Collection Time: 09/22/20 11:11 AM  Result Value Ref Range   ABO/RH(D) A NEG    Antibody Screen NEG    Sample Expiration      09/25/2020,2359 Performed at [facility], [place] [address] [place], [place] [address]     DG Chest 1 View  Result Date: 09/22/2020 CLINICAL DATA:  Fall. EXAM:  CHEST  1 VIEW COMPARISON:  02/19/2018. FINDINGS: Mediastinum hilar structures normal. Cardiomegaly. No pulmonary venous congestion. No focal infiltrate. No pleural effusion or pneumothorax.  Thoracic spine scoliosis and degenerative change. No displaced rib fracture. IMPRESSION: 1. Cardiomegaly. No pulmonary venous congestion. No focal infiltrate. 2. Mild degenerative change thoracic spine. No evidence of displaced rib fracture. No pneumothorax. Electronically Signed   By: Marcello Moores  Register   On: 09/22/2020 11:54   CT HEAD WO CONTRAST  Result Date: 09/22/2020 CLINICAL DATA:  Multiple falls EXAM: CT HEAD WITHOUT CONTRAST TECHNIQUE: Contiguous axial images were obtained from the base of the skull through the vertex without intravenous contrast. COMPARISON:  February 19, 2018 FINDINGS: Brain: No evidence of acute large vascular territory infarction, hemorrhage, hydrocephalus, extra-axial collection or mass lesion/mass effect. Stable moderate to severe global parenchymal volume loss with ex vacuo dilatation of ventricular system. Similar moderate to severe burden of chronic small vessel ischemic disease. Vascular: Dolichoectatic intracranial vessels stable from prior imaging seen with chronic hypertension. Scattered atherosclerotic calcifications of the internal carotid and vertebral arteries at the skull base. Skull: Normal. Negative for fracture or focal lesion. Sinuses/Orbits: Opacified right ethmoid air cells, otherwise the paranasal sinuses and mastoid air cells are predominantly clear. Other: None IMPRESSION: 1. No acute intracranial findings. 2. Stable moderate to severe global parenchymal volume loss and similar chronic small vessel ischemic disease. Electronically Signed   By: Dahlia Bailiff MD   On: 09/22/2020 15:10   DG Hip Unilat With Pelvis 2-3 Views Right  Result Date: 09/22/2020 CLINICAL DATA:  Fall.  Right hip pain EXAM: DG HIP (WITH OR WITHOUT PELVIS) 2-3V RIGHT COMPARISON:  03/27/2017 FINDINGS:  Bones are diffusely osteopenic. Previous ORIF proximal right femur fracture. IM nail and distal interlocking hip screw are identified. No signs of dislocation or fracture. The distal portion of the IM nail is not included on this current series of films. IMPRESSION: 1. No acute findings. 2. Previous ORIF of proximal right femur fracture. Electronically Signed   By: Kerby Moors M.D.   On: 09/22/2020 11:54   DG Femur Min 2 Views Right  Result Date: 09/22/2020 CLINICAL DATA:  pain, fall EXAM: RIGHT FEMUR 2 VIEWS COMPARISON:  Right hip radiograph 03/26/2017, fluoroscopic images 03/27/2017 FINDINGS: There is a posteriorly displaced periprosthetic mid to distal femur fracture, with likely intra-articular involvement of the knee joint. Intramedullary fixation hardware is intact but displaced due to the fracture. Old healed proximal femur fracture. Adjacent soft tissue swelling. Laxity of the patellar tendon in part due to knee hyperextension. IMPRESSION: Posteriorly displaced periprosthetic fracture of the mid to distal femur, with likely intra-articular extension to the knee joint. Electronically Signed   By: Maurine Simmering   On: 09/22/2020 15:17    ROS No recent fever, bleeding abnormalities, urologic dysfunction, GI problems, or weight gain. Blood pressure (!) 156/79, pulse 96, temperature 98.2 F (36.8 C), temperature source Oral, resp. rate (!) 22, height 5\' 5"  (1.651 m), weight 54.4 kg, SpO2 94 %. Physical Exam  A x O x 2, daughter at her side LLE No traumatic wounds, ecchymosis, or rash  Tender with distal thigh valgus deformity and slight shortening  No knee or ankle effusion  Sens DPN, SPN, TN intact  Motor EHL, ext, flex, evers intact; could not assess strength  DP +, No significant edema RUEx shoulder, wrist, digits- no skin wounds, nontender, no instability, no blocks to motion  Elbow with abrasions but no deep contusions or step off  Sens  Ax/R/M/U intact  Mot   Ax/ R/ PIN/ M/ AIN/ U  intact  Rad 2+ Pelvis--no traumatic wounds or rash, no ecchymosis, stable to manual stress,  nontender LUEx shoulder, elbow, wrist, digits- no skin wounds, nontender, no instability, no blocks to motion  Sens  Ax/R/M/U intact  Mot   Ax/ R/ PIN/ M/ AIN/ U intact  Rad 2+ LLE No traumatic wounds, ecchymosis, or rash  Nontender  No knee or ankle effusion  Knee stable to varus/ valgus and anterior/posterior stress  Sens DPN, SPN, TN intact  Motor EHL, ext, flex, evers 5/5  DP 2+, PT 2+, No significant edema  Assessment/Plan: Fall, right distal femur peri-implant fracture, dementia, osteoporosis Daughter POA is staying with her tonight Daughter aware surgery may be tomorrow by Dr. Doreatha Martin depending upon triage or Th with me Remotely on [place]; bone scan 2013  I discussed with the patient's daughter the risks and benefits of surgery for repair of the right femur, including the possibility of infection, nerve injury, vessel injury, wound breakdown, arthritis, symptomatic hardware, DVT/ PE, loss of motion, malunion, nonunion, anesthetic complications, heart attack, stroke, and need for further surgery among others.  She acknowledged these risks and wished to proceed.    Altamese [place], MD [facility], [facility] [phone]  09/22/2020  7:20 PM

## 2020-09-23 ENCOUNTER — Inpatient Hospital Stay (HOSPITAL_COMMUNITY): Payer: Medicare Other

## 2020-09-23 ENCOUNTER — Inpatient Hospital Stay (HOSPITAL_COMMUNITY): Payer: Medicare Other | Admitting: Anesthesiology

## 2020-09-23 ENCOUNTER — Encounter (HOSPITAL_COMMUNITY)
Admission: EM | Disposition: A | Discharge status: Skilled Nursing Facility | Payer: Self-pay | Source: Home / Self Care | Attending: Internal Medicine

## 2020-09-23 DIAGNOSIS — G309 Alzheimer's disease, unspecified: Secondary | ICD-10-CM

## 2020-09-23 DIAGNOSIS — I10 Essential (primary) hypertension: Secondary | ICD-10-CM | POA: Diagnosis not present

## 2020-09-23 DIAGNOSIS — N1832 Chronic kidney disease, stage 3b: Secondary | ICD-10-CM

## 2020-09-23 DIAGNOSIS — F0280 Dementia in other diseases classified elsewhere without behavioral disturbance: Secondary | ICD-10-CM

## 2020-09-23 DIAGNOSIS — S72391S Other fracture of shaft of right femur, sequela: Secondary | ICD-10-CM | POA: Diagnosis not present

## 2020-09-23 HISTORY — PX: HARDWARE REMOVAL: SHX979

## 2020-09-23 HISTORY — PX: ORIF FEMUR FRACTURE: SHX2119

## 2020-09-23 LAB — URINALYSIS, ROUTINE W REFLEX MICROSCOPIC
Bilirubin Urine: NEGATIVE
Glucose, UA: NEGATIVE mg/dL
Ketones, ur: NEGATIVE mg/dL
Leukocytes,Ua: NEGATIVE
Nitrite: POSITIVE — AB
Protein, ur: NEGATIVE mg/dL
Specific Gravity, Urine: 1.014 (ref 1.005–1.030)
pH: 7 (ref 5.0–8.0)

## 2020-09-23 LAB — CBC WITH DIFFERENTIAL/PLATELET
Abs Immature Granulocytes: 0.05 10*3/uL (ref 0.00–0.07)
Basophils Absolute: 0 10*3/uL (ref 0.0–0.1)
Basophils Relative: 0 %
Eosinophils Absolute: 0 10*3/uL (ref 0.0–0.5)
Eosinophils Relative: 0 %
HCT: 37.2 % (ref 36.0–46.0)
Hemoglobin: 11.9 g/dL — ABNORMAL LOW (ref 12.0–15.0)
Immature Granulocytes: 1 %
Lymphocytes Relative: 15 %
Lymphs Abs: 1 10*3/uL (ref 0.7–4.0)
MCH: 28.7 pg (ref 26.0–34.0)
MCHC: 32 g/dL (ref 30.0–36.0)
MCV: 89.6 fL (ref 80.0–100.0)
Monocytes Absolute: 0.7 10*3/uL (ref 0.1–1.0)
Monocytes Relative: 10 %
Neutro Abs: 5.2 10*3/uL (ref 1.7–7.7)
Neutrophils Relative %: 74 %
Platelets: 202 10*3/uL (ref 150–400)
RBC: 4.15 MIL/uL (ref 3.87–5.11)
RDW: 13.2 % (ref 11.5–15.5)
WBC: 7 10*3/uL (ref 4.0–10.5)
nRBC: 0 % (ref 0.0–0.2)

## 2020-09-23 LAB — COMPREHENSIVE METABOLIC PANEL
ALT: 13 U/L (ref 0–44)
AST: 22 U/L (ref 15–41)
Albumin: 3.2 g/dL — ABNORMAL LOW (ref 3.5–5.0)
Alkaline Phosphatase: 54 U/L (ref 38–126)
Anion gap: 10 (ref 5–15)
BUN: 18 mg/dL (ref 8–23)
CO2: 25 mmol/L (ref 22–32)
Calcium: 8.7 mg/dL — ABNORMAL LOW (ref 8.9–10.3)
Chloride: 103 mmol/L (ref 98–111)
Creatinine, Ser: 1.04 mg/dL — ABNORMAL HIGH (ref 0.44–1.00)
GFR, Estimated: 53 mL/min — ABNORMAL LOW (ref >60)
Glucose, Bld: 123 mg/dL — ABNORMAL HIGH (ref 70–99)
Potassium: 3.9 mmol/L (ref 3.5–5.1)
Sodium: 138 mmol/L (ref 135–145)
Total Bilirubin: 1.2 mg/dL (ref 0.3–1.2)
Total Protein: 5.5 g/dL — ABNORMAL LOW (ref 6.5–8.1)

## 2020-09-23 LAB — PROTIME-INR
INR: 1.1 (ref 0.8–1.2)
Prothrombin Time: 13.7 seconds (ref 11.4–15.2)

## 2020-09-23 LAB — VITAMIN D 25 HYDROXY (VIT D DEFICIENCY, FRACTURES): Vit D, 25-Hydroxy: 14.03 ng/mL — ABNORMAL LOW (ref 30–100)

## 2020-09-23 LAB — MRSA PCR SCREENING: MRSA by PCR: NEGATIVE

## 2020-09-23 LAB — SARS CORONAVIRUS 2 (TAT 6-24 HRS): SARS Coronavirus 2: NEGATIVE

## 2020-09-23 LAB — APTT: aPTT: 31 seconds (ref 24–36)

## 2020-09-23 SURGERY — OPEN REDUCTION INTERNAL FIXATION (ORIF) DISTAL FEMUR FRACTURE
Anesthesia: Monitor Anesthesia Care | Site: Leg Upper | Laterality: Right

## 2020-09-23 MED ORDER — ORAL CARE MOUTH RINSE: 15.0000 mL | Freq: Once | OROMUCOSAL | Status: AC

## 2020-09-23 MED ORDER — ACETAMINOPHEN 325 MG PO TABS
650.0000 mg | ORAL_TABLET | Freq: Once | ORAL | Status: AC
  Administered 2020-09-23: 650 mg via ORAL
  Filled 2020-09-23: qty 2

## 2020-09-23 MED ORDER — ONDANSETRON HCL 4 MG/2ML IJ SOLN: 4.0000 mg | Freq: Once | INTRAMUSCULAR | Status: DC | PRN

## 2020-09-23 MED ORDER — LIDOCAINE HCL (CARDIAC) PF 100 MG/5ML IV SOSY
PREFILLED_SYRINGE | INTRAVENOUS | Status: DC | PRN
  Administered 2020-09-23: 60 mg via INTRAVENOUS

## 2020-09-23 MED ORDER — TRANEXAMIC ACID-NACL 1000-0.7 MG/100ML-% IV SOLN
1000.0000 mg | Freq: Once | INTRAVENOUS | Status: AC
  Administered 2020-09-23: 1000 mg via INTRAVENOUS
  Filled 2020-09-23: qty 100

## 2020-09-23 MED ORDER — PROPOFOL 10 MG/ML IV BOLUS
INTRAVENOUS | Status: AC
  Filled 2020-09-23: qty 20

## 2020-09-23 MED ORDER — OXYCODONE HCL 5 MG PO TABS
5.0000 mg | ORAL_TABLET | Freq: Once | ORAL | Status: DC | PRN
Start: 2020-09-23 — End: 2020-09-23

## 2020-09-23 MED ORDER — LACTATED RINGERS IV SOLN: INTRAVENOUS | Status: DC

## 2020-09-23 MED ORDER — DOCUSATE SODIUM 100 MG PO CAPS
100.0000 mg | ORAL_CAPSULE | Freq: Two times a day (BID) | ORAL | Status: DC
  Administered 2020-09-23 – 2020-09-28 (×10): 100 mg via ORAL
  Filled 2020-09-23 (×10): qty 1

## 2020-09-23 MED ORDER — FENTANYL CITRATE (PF) 100 MCG/2ML IJ SOLN
INTRAMUSCULAR | Status: DC | PRN
  Administered 2020-09-23: 50 ug via INTRAVENOUS

## 2020-09-23 MED ORDER — EPHEDRINE SULFATE 50 MG/ML IJ SOLN
INTRAMUSCULAR | Status: DC | PRN
  Administered 2020-09-23: 10 mg via INTRAVENOUS

## 2020-09-23 MED ORDER — PROPOFOL 500 MG/50ML IV EMUL
INTRAVENOUS | Status: DC | PRN
  Administered 2020-09-23: 50 ug/kg/min via INTRAVENOUS

## 2020-09-23 MED ORDER — ONDANSETRON HCL 4 MG/2ML IJ SOLN: 4.0000 mg | Freq: Four times a day (QID) | INTRAMUSCULAR | Status: DC | PRN

## 2020-09-23 MED ORDER — CHLORHEXIDINE GLUCONATE 0.12 % MT SOLN: 15.0000 mL | Freq: Once | OROMUCOSAL | Status: AC

## 2020-09-23 MED ORDER — 0.9 % SODIUM CHLORIDE (POUR BTL) OPTIME
TOPICAL | Status: DC | PRN
  Administered 2020-09-23: 1000 mL

## 2020-09-23 MED ORDER — CHLORHEXIDINE GLUCONATE CLOTH 2 % EX PADS
6.0000 | MEDICATED_PAD | Freq: Every day | CUTANEOUS | Status: DC
  Administered 2020-09-23 – 2020-09-27 (×5): 6 via TOPICAL

## 2020-09-23 MED ORDER — PHENYLEPHRINE HCL-NACL 10-0.9 MG/250ML-% IV SOLN
INTRAVENOUS | Status: DC | PRN
  Administered 2020-09-23: 25 ug/min via INTRAVENOUS

## 2020-09-23 MED ORDER — DEXMEDETOMIDINE (PRECEDEX) IN NS 20 MCG/5ML (4 MCG/ML) IV SYRINGE
PREFILLED_SYRINGE | INTRAVENOUS | Status: DC | PRN
  Administered 2020-09-23: 8 ug via INTRAVENOUS

## 2020-09-23 MED ORDER — EPHEDRINE SULFATE-NACL 50-0.9 MG/10ML-% IV SOSY
PREFILLED_SYRINGE | INTRAVENOUS | Status: DC | PRN
  Administered 2020-09-23: 10 mg via INTRAVENOUS

## 2020-09-23 MED ORDER — BUPIVACAINE IN DEXTROSE 0.75-8.25 % IT SOLN
INTRATHECAL | Status: DC | PRN
  Administered 2020-09-23: 2 mL via INTRATHECAL

## 2020-09-23 MED ORDER — VANCOMYCIN HCL 1000 MG IV SOLR
INTRAVENOUS | Status: DC | PRN
  Administered 2020-09-23: 1000 mg

## 2020-09-23 MED ORDER — METOCLOPRAMIDE HCL 5 MG PO TABS
5.0000 mg | ORAL_TABLET | Freq: Three times a day (TID) | ORAL | Status: DC | PRN
Start: 2020-09-23 — End: 2020-09-28

## 2020-09-23 MED ORDER — CHLORHEXIDINE GLUCONATE 0.12 % MT SOLN
OROMUCOSAL | Status: AC
  Administered 2020-09-23: 15 mL via OROMUCOSAL
  Filled 2020-09-23: qty 15

## 2020-09-23 MED ORDER — ONDANSETRON HCL 4 MG PO TABS: 4.0000 mg | ORAL_TABLET | Freq: Four times a day (QID) | ORAL | Status: DC | PRN

## 2020-09-23 MED ORDER — ENOXAPARIN SODIUM 40 MG/0.4ML ~~LOC~~ SOLN
40.0000 mg | SUBCUTANEOUS | Status: DC
  Administered 2020-09-24 – 2020-09-28 (×5): 40 mg via SUBCUTANEOUS
  Filled 2020-09-23 (×5): qty 0.4

## 2020-09-23 MED ORDER — POLYETHYLENE GLYCOL 3350 17 G PO PACK: 17.0000 g | PACK | Freq: Every day | ORAL | Status: DC | PRN

## 2020-09-23 MED ORDER — PROPOFOL 10 MG/ML IV BOLUS
INTRAVENOUS | Status: DC | PRN
  Administered 2020-09-23: 40 mg via INTRAVENOUS
  Administered 2020-09-23: 10 mg via INTRAVENOUS

## 2020-09-23 MED ORDER — VANCOMYCIN HCL 1000 MG IV SOLR
INTRAVENOUS | Status: AC
  Filled 2020-09-23: qty 1000

## 2020-09-23 MED ORDER — CEFAZOLIN SODIUM-DEXTROSE 2-4 GM/100ML-% IV SOLN
2.0000 g | Freq: Three times a day (TID) | INTRAVENOUS | Status: AC
  Administered 2020-09-23 – 2020-09-24 (×3): 2 g via INTRAVENOUS
  Filled 2020-09-23 (×3): qty 100

## 2020-09-23 MED ORDER — FENTANYL CITRATE (PF) 100 MCG/2ML IJ SOLN: 25.0000 ug | INTRAMUSCULAR | Status: DC | PRN

## 2020-09-23 MED ORDER — ACETAMINOPHEN 325 MG PO TABS
650.0000 mg | ORAL_TABLET | Freq: Four times a day (QID) | ORAL | Status: DC | PRN
  Administered 2020-09-23 – 2020-09-27 (×3): 650 mg via ORAL
  Filled 2020-09-23 (×3): qty 2

## 2020-09-23 MED ORDER — FENTANYL CITRATE (PF) 250 MCG/5ML IJ SOLN
INTRAMUSCULAR | Status: AC
  Filled 2020-09-23: qty 5

## 2020-09-23 MED ORDER — DEXTROSE 50 % IV SOLN
INTRAVENOUS | Status: AC
  Filled 2020-09-23: qty 50

## 2020-09-23 MED ORDER — METOCLOPRAMIDE HCL 5 MG/ML IJ SOLN: 5.0000 mg | Freq: Three times a day (TID) | INTRAMUSCULAR | Status: DC | PRN

## 2020-09-23 MED ORDER — OXYCODONE HCL 5 MG/5ML PO SOLN: 5.0000 mg | Freq: Once | ORAL | Status: DC | PRN

## 2020-09-23 MED ORDER — BOOST PLUS PO LIQD
237.0000 mL | ORAL | Status: DC
  Administered 2020-09-24 – 2020-09-28 (×4): 237 mL via ORAL
  Filled 2020-09-23 (×7): qty 237

## 2020-09-23 MED ORDER — PHENYLEPHRINE HCL (PRESSORS) 10 MG/ML IV SOLN
INTRAVENOUS | Status: DC | PRN
  Administered 2020-09-23: 120 ug via INTRAVENOUS

## 2020-09-23 MED ORDER — ADULT MULTIVITAMIN W/MINERALS CH
1.0000 | ORAL_TABLET | Freq: Every day | ORAL | Status: DC
  Administered 2020-09-24 – 2020-09-28 (×5): 1 via ORAL
  Filled 2020-09-23 (×5): qty 1

## 2020-09-23 MED ORDER — POTASSIUM CHLORIDE IN NACL 20-0.9 MEQ/L-% IV SOLN
INTRAVENOUS | Status: DC
  Filled 2020-09-23 (×5): qty 1000

## 2020-09-23 SURGICAL SUPPLY — 97 items
ADH SKN CLS APL DERMABOND .7 (GAUZE/BANDAGES/DRESSINGS) ×1
APL PRP STRL LF DISP 70% ISPRP (MISCELLANEOUS) ×2
BANDAGE ESMARK 6X9 LF (GAUZE/BANDAGES/DRESSINGS) ×1 IMPLANT
BIT DRILL LONG 3.3 (BIT) ×2 IMPLANT
BIT DRILL QC 3.3X195 (BIT) ×1 IMPLANT
BLADE CLIPPER SURG (BLADE) IMPLANT
BNDG CMPR 9X6 STRL LF SNTH (GAUZE/BANDAGES/DRESSINGS)
BNDG CMPR MED 10X6 ELC LF (GAUZE/BANDAGES/DRESSINGS) ×1
BNDG COHESIVE 6X5 TAN STRL LF (GAUZE/BANDAGES/DRESSINGS) ×4 IMPLANT
BNDG ELASTIC 4X5.8 VLCR STR LF (GAUZE/BANDAGES/DRESSINGS) ×2 IMPLANT
BNDG ELASTIC 6X10 VLCR STRL LF (GAUZE/BANDAGES/DRESSINGS) ×2 IMPLANT
BNDG ELASTIC 6X5.8 VLCR STR LF (GAUZE/BANDAGES/DRESSINGS) ×2 IMPLANT
BNDG ESMARK 6X9 LF (GAUZE/BANDAGES/DRESSINGS)
BNDG GAUZE ELAST 4 BULKY (GAUZE/BANDAGES/DRESSINGS) ×2 IMPLANT
BRUSH SCRUB EZ PLAIN DRY (MISCELLANEOUS) ×8 IMPLANT
CANISTER SUCT 3000ML PPV (MISCELLANEOUS) ×1 IMPLANT
CAP LOCK NCB (Cap) ×9 IMPLANT
CHLORAPREP W/TINT 26 (MISCELLANEOUS) ×4 IMPLANT
COVER SURGICAL LIGHT HANDLE (MISCELLANEOUS) ×4 IMPLANT
COVER WAND RF STERILE (DRAPES) ×2 IMPLANT
CUFF TOURN SGL QUICK 18X4 (TOURNIQUET CUFF) IMPLANT
CUFF TOURN SGL QUICK 24 (TOURNIQUET CUFF)
CUFF TOURN SGL QUICK 34 (TOURNIQUET CUFF)
CUFF TRNQT CYL 24X4X16.5-23 (TOURNIQUET CUFF) IMPLANT
CUFF TRNQT CYL 34X4.125X (TOURNIQUET CUFF) IMPLANT
DERMABOND ADVANCED (GAUZE/BANDAGES/DRESSINGS) ×1
DERMABOND ADVANCED .7 DNX12 (GAUZE/BANDAGES/DRESSINGS) IMPLANT
DRAPE C-ARM 42X72 X-RAY (DRAPES) ×2 IMPLANT
DRAPE C-ARMOR (DRAPES) ×4 IMPLANT
DRAPE HALF SHEET 40X57 (DRAPES) ×4 IMPLANT
DRAPE ORTHO SPLIT 77X108 STRL (DRAPES) ×4
DRAPE SURG 17X23 STRL (DRAPES) ×2 IMPLANT
DRAPE SURG ORHT 6 SPLT 77X108 (DRAPES) ×2 IMPLANT
DRAPE U-SHAPE 47X51 STRL (DRAPES) ×4 IMPLANT
DRSG ADAPTIC 3X8 NADH LF (GAUZE/BANDAGES/DRESSINGS) ×1 IMPLANT
DRSG MEPILEX BORDER 4X12 (GAUZE/BANDAGES/DRESSINGS) IMPLANT
DRSG MEPILEX BORDER 4X4 (GAUZE/BANDAGES/DRESSINGS) IMPLANT
DRSG MEPILEX BORDER 4X8 (GAUZE/BANDAGES/DRESSINGS) ×2 IMPLANT
DRSG PAD ABDOMINAL 8X10 ST (GAUZE/BANDAGES/DRESSINGS) ×6 IMPLANT
ELECT REM PT RETURN 9FT ADLT (ELECTROSURGICAL) ×4
ELECTRODE REM PT RTRN 9FT ADLT (ELECTROSURGICAL) ×2 IMPLANT
GAUZE SPONGE 4X4 12PLY STRL (GAUZE/BANDAGES/DRESSINGS) ×3 IMPLANT
GLOVE BIO SURGEON STRL SZ 6.5 (GLOVE) ×12 IMPLANT
GLOVE BIO SURGEON STRL SZ7.5 (GLOVE) ×16 IMPLANT
GLOVE BIOGEL PI IND STRL 7.5 (GLOVE) ×2 IMPLANT
GLOVE BIOGEL PI INDICATOR 7.5 (GLOVE) ×2
GLOVE SURG UNDER POLY LF SZ6.5 (GLOVE) ×4 IMPLANT
GOWN STRL REUS W/ TWL LRG LVL3 (GOWN DISPOSABLE) ×5 IMPLANT
GOWN STRL REUS W/TWL LRG LVL3 (GOWN DISPOSABLE) ×10
K-WIRE 2.0 (WIRE) ×2
K-WIRE FXSTD 280X2XNS SS (WIRE) ×1
KIT BASIN OR (CUSTOM PROCEDURE TRAY) ×4 IMPLANT
KIT TURNOVER KIT B (KITS) ×4 IMPLANT
KWIRE FXSTD 280X2XNS SS (WIRE) IMPLANT
MANIFOLD NEPTUNE II (INSTRUMENTS) ×2 IMPLANT
NEEDLE 22X1 1/2 (OR ONLY) (NEEDLE) IMPLANT
NS IRRIG 1000ML POUR BTL (IV SOLUTION) ×3 IMPLANT
PACK ORTHO EXTREMITY (CUSTOM PROCEDURE TRAY) ×2 IMPLANT
PACK TOTAL JOINT (CUSTOM PROCEDURE TRAY) ×2 IMPLANT
PAD ARMBOARD 7.5X6 YLW CONV (MISCELLANEOUS) ×8 IMPLANT
PAD CAST 4YDX4 CTTN HI CHSV (CAST SUPPLIES) ×1 IMPLANT
PADDING CAST COTTON 4X4 STRL (CAST SUPPLIES) ×2
PADDING CAST COTTON 6X4 STRL (CAST SUPPLIES) ×5 IMPLANT
PLATE FEM DIST NCB PP 278MM (Plate) ×1 IMPLANT
SCREW 5.0 70MM (Screw) ×3 IMPLANT
SCREW NCB 3.5X75X5X6.2XST (Screw) IMPLANT
SCREW NCB 4.0 32MM (Screw) ×1 IMPLANT
SCREW NCB 4.0MX34M (Screw) ×1 IMPLANT
SCREW NCB 4.0X36MM (Screw) ×1 IMPLANT
SCREW NCB 4.0X40MM (Screw) ×1 IMPLANT
SCREW NCB 5.0X55MM (Screw) ×1 IMPLANT
SCREW NCB 5.0X75MM (Screw) ×2 IMPLANT
SPONGE LAP 18X18 RF (DISPOSABLE) ×1 IMPLANT
STAPLER VISISTAT 35W (STAPLE) ×2 IMPLANT
STOCKINETTE IMPERVIOUS LG (DRAPES) ×2 IMPLANT
STRIP CLOSURE SKIN 1/2X4 (GAUZE/BANDAGES/DRESSINGS) IMPLANT
SUCTION FRAZIER HANDLE 10FR (MISCELLANEOUS) ×2
SUCTION TUBE FRAZIER 10FR DISP (MISCELLANEOUS) ×1 IMPLANT
SUT ETHILON 3 0 PS 1 (SUTURE) ×4 IMPLANT
SUT MNCRL AB 3-0 PS2 18 (SUTURE) ×2 IMPLANT
SUT MNCRL AB 3-0 PS2 27 (SUTURE) ×1 IMPLANT
SUT MON AB 2-0 CT1 36 (SUTURE) ×2 IMPLANT
SUT PDS AB 2-0 CT1 27 (SUTURE) IMPLANT
SUT VIC AB 0 CT1 27 (SUTURE)
SUT VIC AB 0 CT1 27XBRD ANBCTR (SUTURE) IMPLANT
SUT VIC AB 1 CT1 27 (SUTURE)
SUT VIC AB 1 CT1 27XBRD ANBCTR (SUTURE) IMPLANT
SUT VIC AB 2-0 CT1 27 (SUTURE) ×4
SUT VIC AB 2-0 CT1 TAPERPNT 27 (SUTURE) ×2 IMPLANT
SYR CONTROL 10ML LL (SYRINGE) IMPLANT
TOWEL GREEN STERILE (TOWEL DISPOSABLE) ×8 IMPLANT
TOWEL GREEN STERILE FF (TOWEL DISPOSABLE) ×4 IMPLANT
TRAY FOLEY MTR SLVR 16FR STAT (SET/KITS/TRAYS/PACK) IMPLANT
TUBE CONNECTING 12X1/4 (SUCTIONS) ×2 IMPLANT
UNDERPAD 30X36 HEAVY ABSORB (UNDERPADS AND DIAPERS) ×2 IMPLANT
WATER STERILE IRR 1000ML POUR (IV SOLUTION) ×8 IMPLANT
YANKAUER SUCT BULB TIP NO VENT (SUCTIONS) ×2 IMPLANT

## 2020-09-23 NOTE — Plan of Care (Signed)

## 2020-09-23 NOTE — Anesthesia Preprocedure Evaluation (Addendum)
Anesthesia Evaluation  Patient identified by MRN, date of birth, ID band Patient awake    Reviewed: Allergy & Precautions, NPO status , Patient's Chart, lab work & pertinent test results  Airway Mallampati: II  TM Distance: >3 FB Neck ROM: Full    Dental no notable dental hx.    Pulmonary neg pulmonary ROS,    Pulmonary exam normal breath sounds clear to auscultation       Cardiovascular hypertension, Pt. on medications +CHF (grade 1 diastolic dysfunction)  Normal cardiovascular exam Rhythm:Regular Rate:Normal  Echo 2016: - Left ventricle: The cavity size was normal. Wall thickness was  increased in a pattern of mild LVH. Systolic function was normal.  The estimated ejection fraction was in the range of 55% to 60%.  Wall motion was normal; there were no regional wall motion  abnormalities. Doppler parameters are consistent with abnormal  left ventricular relaxation (grade 1 diastolic dysfunction).  - Aortic valve: There was no stenosis. There was trivial  regurgitation.  - Mitral valve: Mildly calcified annulus. There was no significant  regurgitation.  - Right ventricle: Poorly visualized. The cavity size was normal.  Systolic function was normal.  - Tricuspid valve: Peak RV-RA gradient (S): 19 mm Hg.  - Pulmonary arteries: PA peak pressure: 22 mm Hg (S).  - Inferior vena cava: The vessel was normal in size. The  respirophasic diameter changes were in the normal range (>= 50%),  consistent with normal central venous pressure.    Neuro/Psych PSYCHIATRIC DISORDERS Anxiety Dementia negative neurological ROS     GI/Hepatic Neg liver ROS, GERD  Controlled,  Endo/Other  Hypothyroidism   Renal/GU Renal InsufficiencyRenal diseaseCr 1.04  negative genitourinary   Musculoskeletal  (+) Arthritis , Osteoarthritis,  Right distal femur fx   Abdominal   Peds  Hematology negative hematology ROS (+)    Anesthesia Other Findings   Reproductive/Obstetrics negative OB ROS                            Anesthesia Physical Anesthesia Plan  ASA: III  Anesthesia Plan: Spinal and MAC   Post-op Pain Management:    Induction:   PONV Risk Score and Plan: 2 and Propofol infusion and TIVA  Airway Management Planned: Natural Airway and Nasal Cannula  Additional Equipment: None  Intra-op Plan:   Post-operative Plan:   Informed Consent: I have reviewed the patients History and Physical, chart, labs and discussed the procedure including the risks, benefits and alternatives for the proposed anesthesia with the patient or authorized representative who has indicated his/her understanding and acceptance.     Consent reviewed with POA and Dental advisory given  Plan Discussed with: CRNA  Anesthesia Plan Comments:        Anesthesia Quick Evaluation

## 2020-09-23 NOTE — Anesthesia Procedure Notes (Addendum)
Spinal  Patient location during procedure: OR Start time: 09/23/2020 12:00 PM End time: 09/23/2020 12:05 PM Reason for block: surgical anesthesia Staffing Performed: anesthesiologist  Anesthesiologist: Pervis Hocking, DO Preanesthetic Checklist Completed: patient identified, IV checked, risks and benefits discussed, surgical consent, monitors and equipment checked, pre-op evaluation and timeout performed Spinal Block Patient position: right lateral decubitus Prep: DuraPrep and site prepped and draped Patient monitoring: cardiac monitor, continuous pulse ox and blood pressure Approach: midline Location: L3-4 Injection technique: single-shot Needle Needle type: Pencan  Needle gauge: 24 G Needle length: 9 cm Assessment Sensory level: T6 Events: CSF return Additional Notes Functioning IV was confirmed and monitors were applied. Sterile prep and drape, including hand hygiene and sterile gloves were used. The patient was positioned and the spine was prepped. The skin was anesthetized with lidocaine.  Free flow of clear CSF was obtained prior to injecting local anesthetic into the CSF.  The spinal needle aspirated freely following injection.  The needle was carefully withdrawn.  The patient tolerated the procedure well.

## 2020-09-23 NOTE — H&P (View-Only) (Signed)
Ortho Trauma Note  Patient seen and examined and I have reviewed the case with Dr. Handy.  Patient has a periprosthetic distal femur fracture due to the OR availability I was asked to take over care of the patient.  I discussed at bedside with the daughter risks and benefits of the surgery.  Risks included but not limited to bleeding, infection, malunion, nonunion, hardware failure, hardware irritation, nerve and blood vessel injury, DVT, even the possibility anesthetic complications.  The patient's daughter agreed to proceed with surgery and consent was obtained.  Kashmir Lysaght P. Zaidin Blyden, MD [facility] [phone] (office) [email or web address]   

## 2020-09-23 NOTE — Transfer of Care (Signed)
Immediate Anesthesia Transfer of Care Note  Patient: Julia [name]  Procedure(s) Performed: OPEN REDUCTION INTERNAL FIXATION (ORIF) DISTAL FEMUR FRACTURE (Right Leg Upper) HARDWARE REMOVAL (Right Leg Upper)  Patient Location: PACU  Anesthesia Type:GA combined with regional for post-op pain  Level of Consciousness: drowsy and responds to stimulation.  Airway & Oxygen Therapy: Patient Spontanous Breathing and Patient connected to nasal cannula oxygen  Post-op Assessment: Report given to RN and Post -op Vital signs reviewed and stable  Post vital signs: Reviewed and stable  Last Vitals:  Vitals Value Taken Time  BP 90/48 09/23/20 1331  Temp    Pulse 63 09/23/20 1333  Resp 9 09/23/20 1333  SpO2 95 % 09/23/20 1333  Vitals shown include unvalidated device data.  Last Pain:  Vitals:   09/23/20 0830  TempSrc: Oral  PainSc:       Patients Stated Pain Goal: 1 (35/59/74 1638)  Complications: No complications documented.

## 2020-09-23 NOTE — Progress Notes (Signed)
Ortho Trauma Note  Patient seen and examined and I have reviewed the case with Dr. Marcelino Scot.  Patient has a periprosthetic distal femur fracture due to the OR availability I was asked to take over care of the patient.  I discussed at bedside with the daughter risks and benefits of the surgery.  Risks included but not limited to bleeding, infection, malunion, nonunion, hardware failure, hardware irritation, nerve and blood vessel injury, DVT, even the possibility anesthetic complications.  The patient's daughter agreed to proceed with surgery and consent was obtained.  Shona Needles, MD [facility] [phone] (office) [email or web address]

## 2020-09-23 NOTE — Interval H&P Note (Signed)
History and Physical Interval Note:  09/23/2020 11:00 AM  Julia [name]  has presented today for surgery, with the diagnosis of right distal femur fracture, retained right femoral nail.  The various methods of treatment have been discussed with the patient and family. After consideration of risks, benefits and other options for treatment, the patient has consented to  Procedure(s): OPEN REDUCTION INTERNAL FIXATION (ORIF) DISTAL FEMUR FRACTURE (Right) HARDWARE REMOVAL (Right) as a surgical intervention.  The patient's history has been reviewed, patient examined, no change in status, stable for surgery.  I have reviewed the patient's chart and labs.  Questions were answered to the patient's satisfaction.     Lennette Bihari P Skyelar Swigart

## 2020-09-23 NOTE — Plan of Care (Signed)
  Problem: Education: Goal: Knowledge of General Education information will improve Description Including pain rating scale, medication(s)/side effects and non-pharmacologic comfort measures Outcome: Progressing   Problem: Health Behavior/Discharge Planning: Goal: Ability to manage health-related needs will improve Outcome: Progressing   

## 2020-09-23 NOTE — Op Note (Signed)
Orthopaedic Surgery Operative Note (CSN: [id number] ) Date of Surgery: 09/23/2020  Admit Date: 09/22/2020   Diagnoses: Pre-Op Diagnoses: Right periprosthetic distal femur fracture   Post-Op Diagnosis: Same  Procedures: 1. CPT 27511-Open reduction internal fixation of right distal femur fracture 2. CPT 20680-Removal of hardware of right femur  Surgeons : Primary: Doyce Saling, Thomasene Lot, MD  Assistant: Patrecia Pace, PA-C  Location: OR 7   Anesthesia:Spinal  Antibiotics: Ancef 2g preop with 1 gm vancomycin powder placed topically  Tourniquet time:None  Estimated Blood Loss: 564PP  Complications:None  Specimens:None   Implants: Implant Name Type Inv. Item Serial No. Manufacturer Lot No. LRB No. Used Action  CAP LOCK NCB - [id number] Cap CAP LOCK NCB  [employer](ORTH,TRAU,BIO,SG)  Right 9 Implanted  PLATE FEM DIST NCB PP 278MM - [id number] Plate PLATE FEM DIST NCB PP 278MM  [employer](ORTH,TRAU,BIO,SG)  Right 1 Implanted  SCREW NCB 5.0X55MM - [id number] Screw SCREW NCB 5.0X55MM  [employer](ORTH,TRAU,BIO,SG)  Right 1 Implanted  SCREW 5.0 70MM - [id number] Screw SCREW 5.0 70MM  [employer]ON(ORTH,TRAU,BIO,SG)  Right 3 Implanted  SCREW NCB 5.0X75MM - [id number] Screw SCREW NCB 5.0X75MM  [employer](ORTH,TRAU,BIO,SG)  Right 1 Implanted  SCREW NCB 4.0 32MM - [id number] Screw SCREW NCB 4.0 32MM  Z[employer](ORTH,TRAU,BIO,SG)  Right 1 Implanted  SCREW NCB 4.0MX34M - [id number] Screw SCREW NCB 4.0MX34M  [employer]ON(ORTH,TRAU,BIO,SG)  Right 1 Implanted  SCREW NCB 4.0X36MM - [id number] Screw SCREW NCB 4.0X36MM  [employer](ORTH,TRAU,BIO,SG)  Right 1 Implanted  SCREW NCB 4.0X40MM - [id number] Screw SCREW NCB 4.0X40MM  [employer]ON(ORTH,TRAU,BIO,SG)  Right 1 Implanted     Indications for Surgery: 86 year old female who sustained a periprosthetic supracondylar distal femur fracture below a hip nail.  Due to the unstable nature of her injury I recommended proceeding with open reduction internal  fixation.  Risks and benefits were discussed with the patient's daughter.  Risks included but not limited to bleeding, infection, malunion, nonunion, hardware failure, hardware irritation, nerve or blood vessel injury, DVT, knee stiffness, even the possibility anesthetic complications.  The patient's daughter agreed to the proceed with surgery and consent was obtained  Operative Findings: 1.  Removal of previous distal interlock from the femoral hip nail 2.  Open reduction internal fixation of right supracondylar distal femur fracture using [employer] NCB distal femoral locking plate.  Procedure: The patient was identified in the preoperative holding area. Consent was confirmed with the patient and their family and all questions were answered. The operative extremity was marked after confirmation with the patient. she was then brought back to the operating room by our anesthesia colleagues.  She was placed under spinal anesthetic and carefully transferred over to a radiolucent flat top table.  A bump was placed under her operative hip.  The right lower extremity was then prepped and draped in usual sterile fashion.  A timeout was performed to verify the patient, the procedure, and the extremity.  Preoperative antibiotics were dosed.  Fluoroscopic imaging was obtained to show the unstable nature of her injury.  The hip and knee were flexed over a triangle.  Reduction maneuvers were performed.  A lateral approach to the distal femur was carried down through skin and subcutaneous tissue.  The IT band was split in line with the incision.  I mobilized the vastus lateralis to expose the distal femur.  For started out by removing the distal interlock that was in the hip nail without difficulty.  I then developed an interval between the vastus lateralis and  the lateral cortex of the femur and slid a 12 hole [employer] NCB distal femoral locking plate of the targeting arm along the lateral cortex of the femur.   I held provisionally distally with a 2.0 mm K wire.  Percutaneously placed a 3.3 mm drill bit in the most proximal screw hole and drilled this bicortically posterior to the femur nail.  I returned to the distal segment and placed 5.0 millimeter screws to bring the distal plate flush to bone.  A total of 2 screws were placed.  I then used the targeting arm to place 4.0 millimeter screws bicortically posterior to the hip nail using the targeting arm.  A total of 3 screws were placed into the femoral shaft with all of them obtaining bicortical purchase.  Locking caps were placed on all of the femoral shaft screws.  I removed the targeting arm and returned to the distal portion to place a total of five 5.0 millimeter screws.  Locking caps were placed on all distal screws.  I obtained a lateral view and appeared that I could place a screw through the plate and through the nail.  A 3.3 mm drill bit was used using perfect circle technique to drill through the lateral cortex of the medial cortex.  A 4.0 millimeter screw was placed through the plate and through the nail and then a locking cap was placed.  Final fluoroscopic imaging was obtained.  The incision was copiously irrigated.  A gram of vancomycin powder was placed into the incision.  0 Vicryl was used to close the IT band.  Skin was closed with 2-0 Vicryl and 3-0 Monocryl.  Dermabond was used to seal the skin.  Sterile dressing was placed.  The patient was then awoke from anesthesia and taken to the PACU in stable condition.  Post Op Plan/Instructions: Patient will be weightbearing as tolerated to the right lower extremity.  She will receive postoperative Ancef.  She will receive Lovenox for DVT prophylaxis.  She will mobilize with physical and Occupational Therapy.  I was present and performed the entire surgery.  Patrecia Pace, PA-C did assist me throughout the case. An assistant was necessary given the difficulty in approach, maintenance of reduction  and ability to instrument the fracture.   Katha Hamming, MD [facility]

## 2020-09-23 NOTE — Anesthesia Postprocedure Evaluation (Signed)
Anesthesia Post Note  Patient: Julia [name]  Procedure(s) Performed: OPEN REDUCTION INTERNAL FIXATION (ORIF) DISTAL FEMUR FRACTURE (Right Leg Upper) HARDWARE REMOVAL (Right Leg Upper)     Patient location during evaluation: PACU Anesthesia Type: MAC and Spinal Level of consciousness: awake and alert Pain management: pain level controlled Vital Signs Assessment: post-procedure vital signs reviewed and stable Respiratory status: spontaneous breathing, nonlabored ventilation and respiratory function stable Cardiovascular status: blood pressure returned to baseline and stable Postop Assessment: no apparent nausea or vomiting Anesthetic complications: no   No complications documented.  Last Vitals:  Vitals:   09/23/20 1330 09/23/20 1345  BP: (!) 90/48 (!) 113/52  Pulse: 70 62  Resp: 11 12  Temp: 36.6 C   SpO2: 93% 96%    Last Pain:  Vitals:   09/23/20 1345  TempSrc:   PainSc: 0-No pain                 Pervis Hocking

## 2020-09-23 NOTE — Progress Notes (Signed)
Patient has been NPO since midnight.

## 2020-09-23 NOTE — TOC CAGE-AID Note (Signed)
Transition of Care [facility]) - CAGE-AID Screening   Patient Details  Name: Julia [name] MRN: [id number] Date of Birth: June 21, 1934     Elvina Sidle, RN Trauma Response Nurse Phone Number: 09/23/2020, 10:09 AM       CAGE-AID Screening:    Have You Ever Felt You Ought to Cut Down on Your Drinking or Drug Use?: No Have People Annoyed You By [employer] Your Drinking Or Drug Use?: No Have You Felt Bad Or Guilty About Your Drinking Or Drug Use?: No Have You Ever Had a Drink or Used Drugs First Thing In The Morning to Steady Your Nerves or to Get Rid of a Hangover?: No CAGE-AID Score: 0  Substance Abuse Education Offered: No (denies alcohol/drug use)

## 2020-09-23 NOTE — Progress Notes (Signed)
PROGRESS NOTE    [name]  IWP:[id number] DOB: [birth year] DOA: 09/22/2020 PCP: Dorothyann Peng, NP    Brief Narrative:  Julia [name] is a 86 y.o. female with medical history significant for CKD 3, hypothyroidism, HTN, Alzheimer's dementia who presents by EMS after a fall at home.  Patient is not a good historian and cannot give details of what happened.  Daughter is at bedside who provides the history.  Daughter is POA.  Patient was at home with her husband and had walked down the gravel driveway toward her mailbox when she fell.  Family has cameras up around the house but she was in an area that was a blind spot to the camera so the fall was not seen.  She laid the ground for 15 to 20 minutes when the mailman came by the house and saw her and called for help.  Patient complained of pain in her right leg and was not able to stand up and bear weight.  She denies any numbness or weakness of her right leg or foot.  Pain is exacerbated by movement of her leg.  She has a history of right hip fracture in 2018 with repair.  Daughter reports that she initially broke her right hip in June 2018 and fell again in September 2018 fracturing around the hip prosthesis and was reoperated on.  She generally has some chronic hip pain and takes Tylenol for this twice a day.  She normally ambulates and does not use a walker or cane at home. Patient denies tobacco, alcohol use  ED Course: [name] is found to have a displaced periprosthetic fracture in the mid to distal femur which extends into the intra-articular surface of the knee joint.  Hemodynamically stable.  Labs are unremarkable.  Hospitalist service asked to admit for further management   4/27-plan for surgery today  Consultants:   Orthopedics  Procedures:   Antimicrobials:       Subjective: Has no complaints of shortness of breath or chest pain  Objective: Vitals:   09/22/20 1945 09/22/20 2134 09/23/20 0600 09/23/20 0830   BP: (!) 133/108 (!) 161/76 134/62 129/61  Pulse: 96 96 89 83  Resp: 16 18 16 16   Temp: 98.2 F (36.8 C) 97.8 F (36.6 C) 97.8 F (36.6 C) (!) 97.5 F (36.4 C)  TempSrc:  Oral Oral Oral  SpO2: 93% 93% 93% 97%  Weight:      Height:        Intake/Output Summary (Last 24 hours) at 09/23/2020 0847 Last data filed at 09/23/2020 0600 Gross per 24 hour  Intake 573.65 ml  Output 500 ml  Net 73.65 ml   Filed Weights   09/22/20 1034  Weight: 54.4 kg    Examination:  General exam: Appears calm and comfortable  Respiratory system: Clear to auscultation. Respiratory effort normal. Cardiovascular system: S1 & S2 heard, RRR. No JVD, murmurs, rubs, gallops or clicks.  Gastrointestinal system: Abdomen is nondistended, soft and nontender.  No organomegaly or masses felt. Normal bowel sounds heard. Central nervous system: Grossly intact Extremities: No edema Skin: Warm and dry Psychiatry:Mood & affect appropriate in current setting.     Data Reviewed: I have personally reviewed following labs and imaging studies  CBC: Recent Labs  Lab 09/22/20 1057 09/23/20 0313  WBC 9.5 7.0  NEUTROABS 7.9* 5.2  HGB 13.9 11.9*  HCT 44.0 37.2  MCV 90.9 89.6  PLT 214 505   Basic Metabolic Panel: Recent Labs  Lab 09/22/20  1057 09/23/20 0313  NA 138 138  K 4.1 3.9  CL 105 103  CO2 25 25  GLUCOSE 117* 123*  BUN 21 18  CREATININE 1.08* 1.04*  CALCIUM 9.1 8.7*   GFR: Estimated Creatinine Clearance: 35.2 mL/min (A) (by C-G formula based on SCr of 1.04 mg/dL (H)). Liver Function Tests: Recent Labs  Lab 09/23/20 0313  AST 22  ALT 13  ALKPHOS 54  BILITOT 1.2  PROT 5.5*  ALBUMIN 3.2*   No results for input(s): LIPASE, AMYLASE in the last 168 hours. No results for input(s): AMMONIA in the last 168 hours. Coagulation Profile: Recent Labs  Lab 09/22/20 1057 09/23/20 0313  INR 1.0 1.1   Cardiac Enzymes: No results for input(s): CKTOTAL, CKMB, CKMBINDEX, TROPONINI in the last 168  hours. BNP (last 3 results) No results for input(s): PROBNP in the last 8760 hours. HbA1C: No results for input(s): HGBA1C in the last 72 hours. CBG: No results for input(s): GLUCAP in the last 168 hours. Lipid Profile: No results for input(s): CHOL, HDL, LDLCALC, TRIG, CHOLHDL, LDLDIRECT in the last 72 hours. Thyroid Function Tests: No results for input(s): TSH, T4TOTAL, FREET4, T3FREE, THYROIDAB in the last 72 hours. Anemia Panel: No results for input(s): VITAMINB12, FOLATE, FERRITIN, TIBC, IRON, RETICCTPCT in the last 72 hours. Sepsis Labs: No results for input(s): PROCALCITON, LATICACIDVEN in the last 168 hours.  Recent Results (from the past 240 hour(s))  SARS CORONAVIRUS 2 (TAT 6-24 HRS) Nasopharyngeal Nasopharyngeal Swab     Status: None   Collection Time: 09/22/20  5:41 PM   Specimen: Nasopharyngeal Swab  Result Value Ref Range Status   SARS Coronavirus 2 NEGATIVE NEGATIVE Final    Comment: (NOTE) SARS-CoV-2 target nucleic acids are NOT DETECTED.  The SARS-CoV-2 RNA is generally detectable in upper and lower respiratory specimens during the acute phase of infection. Negative results do not preclude SARS-CoV-2 infection, do not rule out co-infections with other pathogens, and should not be used as the sole basis for treatment or other patient management decisions. Negative results must be combined with clinical observations, patient history, and epidemiological information. The expected result is Negative.  Fact Sheet for Patients: SugarRoll.be  Fact Sheet for Healthcare Providers: https://www.woods-mathews.com/  This test is not yet approved or cleared by the [place] FDA and  has been authorized for detection and/or diagnosis of SARS-CoV-2 by FDA under an Emergency Use Authorization (EUA). This EUA will remain  in effect (meaning this test can be used) for the duration of the COVID-19 declaration under Se ction  564(b)(1) of the Act, 21 U.S.C. section 360bbb-3(b)(1), unless the authorization is terminated or revoked sooner.  Performed at [facility], [place] [address] [place], [place] [address]   MRSA PCR Screening     Status: None   Collection Time: 09/23/20  5:59 AM   Specimen: Nasal Mucosa; Nasopharyngeal  Result Value Ref Range Status   MRSA by PCR NEGATIVE NEGATIVE Final    Comment:        The GeneXpert MRSA Assay (FDA approved for NASAL specimens only), is one component of a comprehensive MRSA colonization surveillance program. It is not intended to diagnose MRSA infection nor to guide or monitor treatment for MRSA infections. Performed at [facility], [place] [address], [place], [place] [address]          Radiology Studies: DG Chest 1 View  Result Date: 09/22/2020 CLINICAL DATA:  Fall. EXAM: CHEST  1 VIEW COMPARISON:  02/19/2018. FINDINGS: Mediastinum  hilar structures normal. Cardiomegaly. No pulmonary venous congestion. No focal infiltrate. No pleural effusion or pneumothorax. Thoracic spine scoliosis and degenerative change. No displaced rib fracture. IMPRESSION: 1. Cardiomegaly. No pulmonary venous congestion. No focal infiltrate. 2. Mild degenerative change thoracic spine. No evidence of displaced rib fracture. No pneumothorax. Electronically Signed   By: Marcello Moores  Register   On: 09/22/2020 11:54   DG Elbow 2 Views Right  Result Date: 09/22/2020 CLINICAL DATA:  86 year old female with fall and right elbow pain. EXAM: RIGHT ELBOW - 2 VIEW COMPARISON:  None. FINDINGS: There is no acute fracture or dislocation. The bones are osteopenic. No significant arthritic changes. No joint effusion. The soft tissues unremarkable. IMPRESSION: Negative. Electronically Signed   By: Anner Crete M.D.   On: 09/22/2020 20:26   CT HEAD WO CONTRAST  Result Date: 09/22/2020 CLINICAL DATA:  Multiple falls EXAM: CT HEAD WITHOUT CONTRAST TECHNIQUE: Contiguous axial images were obtained  from the base of the skull through the vertex without intravenous contrast. COMPARISON:  February 19, 2018 FINDINGS: Brain: No evidence of acute large vascular territory infarction, hemorrhage, hydrocephalus, extra-axial collection or mass lesion/mass effect. Stable moderate to severe global parenchymal volume loss with ex vacuo dilatation of ventricular system. Similar moderate to severe burden of chronic small vessel ischemic disease. Vascular: Dolichoectatic intracranial vessels stable from prior imaging seen with chronic hypertension. Scattered atherosclerotic calcifications of the internal carotid and vertebral arteries at the skull base. Skull: Normal. Negative for fracture or focal lesion. Sinuses/Orbits: Opacified right ethmoid air cells, otherwise the paranasal sinuses and mastoid air cells are predominantly clear. Other: None IMPRESSION: 1. No acute intracranial findings. 2. Stable moderate to severe global parenchymal volume loss and similar chronic small vessel ischemic disease. Electronically Signed   By: Dahlia Bailiff MD   On: 09/22/2020 15:10   DG Hip Unilat With Pelvis 2-3 Views Right  Result Date: 09/22/2020 CLINICAL DATA:  Fall.  Right hip pain EXAM: DG HIP (WITH OR WITHOUT PELVIS) 2-3V RIGHT COMPARISON:  03/27/2017 FINDINGS: Bones are diffusely osteopenic. Previous ORIF proximal right femur fracture. IM nail and distal interlocking hip screw are identified. No signs of dislocation or fracture. The distal portion of the IM nail is not included on this current series of films. IMPRESSION: 1. No acute findings. 2. Previous ORIF of proximal right femur fracture. Electronically Signed   By: Kerby Moors M.D.   On: 09/22/2020 11:54   DG Femur Min 2 Views Right  Result Date: 09/22/2020 CLINICAL DATA:  pain, fall EXAM: RIGHT FEMUR 2 VIEWS COMPARISON:  Right hip radiograph 03/26/2017, fluoroscopic images 03/27/2017 FINDINGS: There is a posteriorly displaced periprosthetic mid to distal femur  fracture, with likely intra-articular involvement of the knee joint. Intramedullary fixation hardware is intact but displaced due to the fracture. Old healed proximal femur fracture. Adjacent soft tissue swelling. Laxity of the patellar tendon in part due to knee hyperextension. IMPRESSION: Posteriorly displaced periprosthetic fracture of the mid to distal femur, with likely intra-articular extension to the knee joint. Electronically Signed   By: Maurine Simmering   On: 09/22/2020 15:17        Scheduled Meds: . acetaminophen  650 mg Oral Once  . amLODipine  5 mg Oral Daily  . chlorhexidine  60 mL Topical Once  . galantamine  24 mg Oral q AM  . levothyroxine  88 mcg Oral QAC breakfast  . memantine  28 mg Oral QHS  . mirtazapine  15 mg Oral QHS  . povidone-iodine  2 application  Topical Once  . QUEtiapine  25 mg Oral QHS   Continuous Infusions: .  ceFAZolin (ANCEF) IV    . lactated ringers 75 mL/hr at 09/22/20 2220  . tranexamic acid      Assessment & Plan:   Active Problems:   Essential hypertension   Alzheimer's dementia ([place])   Closed fracture of shaft of right femur (HCC)   CKD (chronic kidney disease) stage 3, GFR 30-59 ml/min (HCC)    Closed fracture of shaft of right femur  Orthopedics following Plan for surgery today Keep n.p.o., IV fluids  Essential hypertension Continue amlodipine  CKD (chronic kidney disease) stage 3 A Stable Continue monitoring     Alzheimer's dementia  Continue Namenda and [place]    DVT prophylaxis: SCD Code Status: Full Family Communication: Daughter at bedside  Status is: Inpatient  Remains inpatient appropriate because:Inpatient level of care appropriate due to severity of illness   Dispo: The patient is from: Home              Anticipated d/c is to: TBD              Patient currently is not medically stable to d/c.   Difficult to place patient No            LOS: 1 day   Time spent: 35 minutes with more than 50%  COC    Nolberto Hanlon, MD [facility] Pager [phone]  If 7PM-7AM, please contact night-coverage 09/23/2020, 8:47 AM

## 2020-09-23 NOTE — Progress Notes (Signed)
Initial Nutrition Assessment  DOCUMENTATION CODES:   Non-severe (moderate) malnutrition in context of chronic illness  INTERVENTION:   Liberalize diet to REGULAR   Try Boost Plus chocolate once daily- Each supplement provides 360kcal and 14g protein.    Add Magic cup TID with meals, each supplement provides 290 kcal and 9 grams of protein  Add snacks BID (yogurt)  MVI daily  NUTRITION DIAGNOSIS:   Moderate Malnutrition related to social / environmental circumstances (dementia) as evidenced by energy intake < 75% for > or equal to 3 months,mild fat depletion,moderate muscle depletion.  GOAL:   Patient will meet greater than or equal to 90% of their needs  MONITOR:   PO intake,Supplement acceptance,Weight trends,Labs,I & O's  REASON FOR ASSESSMENT:   Consult Hip fracture protocol  ASSESSMENT:   Patient with PMH significant for CKD III, HTN, HLD, and Alzheimer's dementia. Presents this admission after mechanical fall resulting in R hip fracture.   Obtained history from daughter at bedside. Daughter denies patient had decreased intake or loss in appetite PTA. States she typically consumes 4-5 yogurts and grazes on mashed potatoes, mac and cheese, and green beans throughout the day. Recently had a few teeth fall out and sticks to softer foods (does not like meat products). Patient does not wish for texture of diet to be changed. She does not like the taste of Ensure but is willing to try Boost Plus.   Daughter reports patient UBW stays around 115-117 lb. They noticed a dip in her weight to 105-107 lb after she stopped taking her thyroid medication but she was able to gain it back. Admission weight looks to be stated. Will need to obtain recent weight to assess for weight loss.    UOP: 500 ml x 24 hrs   Drips: LR @ 10 ml/hr  Medications: remeron Labs: CBG 117-123   NUTRITION - FOCUSED PHYSICAL EXAM:  Flowsheet Row Most Recent Value  Orbital Region Mild depletion  Upper  Arm Region Mild depletion  Thoracic and Lumbar Region Unable to assess  Buccal Region Mild depletion  Temple Region Severe depletion  Clavicle Bone Region Moderate depletion  Clavicle and Acromion Bone Region Mild depletion  Scapular Bone Region Unable to assess  Dorsal Hand Mild depletion  Patellar Region Mild depletion  Anterior Thigh Region Mild depletion  Posterior Calf Region Unable to assess  Edema (RD Assessment) Unable to assess  Hair Reviewed  Eyes Reviewed  Mouth Reviewed  Skin Reviewed  Nails Reviewed     Diet Order:   Diet Order            Diet NPO time specified  Diet effective now                 EDUCATION NEEDS:   Education needs have been addressed  Skin:  Skin Assessment: Reviewed RN Assessment  Last BM:  PTA  Height:   Ht Readings from Last 1 Encounters:  09/22/20 _0  (1.651 m)    Weight:   Wt Readings from Last 1 Encounters:  09/22/20 54.4 kg    BMI:  Body mass index is 19.97 kg/m.  Estimated Nutritional Needs:   Kcal:  1600-1800 kcal  Protein:  85-100 grams  Fluid:  >/= 1.6 L/day  Mariana Single RD, LDN Clinical Nutrition Pager listed in [place]

## 2020-09-24 ENCOUNTER — Encounter (HOSPITAL_COMMUNITY): Payer: Self-pay | Admitting: Student

## 2020-09-24 DIAGNOSIS — D62 Acute posthemorrhagic anemia: Secondary | ICD-10-CM

## 2020-09-24 DIAGNOSIS — G309 Alzheimer's disease, unspecified: Secondary | ICD-10-CM | POA: Diagnosis not present

## 2020-09-24 DIAGNOSIS — S72391S Other fracture of shaft of right femur, sequela: Secondary | ICD-10-CM | POA: Diagnosis not present

## 2020-09-24 DIAGNOSIS — N1832 Chronic kidney disease, stage 3b: Secondary | ICD-10-CM | POA: Diagnosis not present

## 2020-09-24 LAB — BASIC METABOLIC PANEL
Anion gap: 8 (ref 5–15)
BUN: 14 mg/dL (ref 8–23)
CO2: 26 mmol/L (ref 22–32)
Calcium: 8.1 mg/dL — ABNORMAL LOW (ref 8.9–10.3)
Chloride: 104 mmol/L (ref 98–111)
Creatinine, Ser: 1.07 mg/dL — ABNORMAL HIGH (ref 0.44–1.00)
GFR, Estimated: 52 mL/min — ABNORMAL LOW (ref >60)
Glucose, Bld: 106 mg/dL — ABNORMAL HIGH (ref 70–99)
Potassium: 4.3 mmol/L (ref 3.5–5.1)
Sodium: 138 mmol/L (ref 135–145)

## 2020-09-24 LAB — CBC
HCT: 35 % — ABNORMAL LOW (ref 36.0–46.0)
Hemoglobin: 10.9 g/dL — ABNORMAL LOW (ref 12.0–15.0)
MCH: 28.9 pg (ref 26.0–34.0)
MCHC: 31.1 g/dL (ref 30.0–36.0)
MCV: 92.8 fL (ref 80.0–100.0)
Platelets: 160 10*3/uL (ref 150–400)
RBC: 3.77 MIL/uL — ABNORMAL LOW (ref 3.87–5.11)
RDW: 13.3 % (ref 11.5–15.5)
WBC: 7.8 10*3/uL (ref 4.0–10.5)
nRBC: 0 % (ref 0.0–0.2)

## 2020-09-24 MED ORDER — TRAMADOL HCL 50 MG PO TABS
50.0000 mg | ORAL_TABLET | Freq: Two times a day (BID) | ORAL | Status: DC | PRN
  Administered 2020-09-24 – 2020-09-28 (×8): 50 mg via ORAL
  Filled 2020-09-24 (×8): qty 1

## 2020-09-24 MED ORDER — VITAMIN D 25 MCG (1000 UNIT) PO TABS
2000.0000 [IU] | ORAL_TABLET | Freq: Every day | ORAL | Status: DC
  Administered 2020-09-24 – 2020-09-28 (×5): 2000 [IU] via ORAL
  Filled 2020-09-24 (×5): qty 2

## 2020-09-24 NOTE — Progress Notes (Signed)
PROGRESS NOTE    [name]  PFX:[id number] DOB: July 09, 1934 DOA: 09/22/2020 PCP: Dorothyann Peng, NP    Brief Narrative:  Julia [name] is a 86 y.o. female with medical history significant for CKD 3, hypothyroidism, HTN, Alzheimer's dementia who presents by EMS after a fall at home.  Patient is not a good historian and cannot give details of what happened.  Daughter is at bedside who provides the history.  Daughter is POA.  Patient was at home with her husband and had walked down the gravel driveway toward her mailbox when she fell.  Family has cameras up around the house but she was in an area that was a blind spot to the camera so the fall was not seen.  She laid the ground for 15 to 20 minutes when the mailman came by the house and saw her and called for help.  Patient complained of pain in her right leg and was not able to stand up and bear weight.  She denies any numbness or weakness of her right leg or foot.  Pain is exacerbated by movement of her leg.  She has a history of right hip fracture in 2018 with repair.  Daughter reports that she initially broke her right hip in June 2018 and fell again in September 2018 fracturing around the hip prosthesis and was reoperated on.  She generally has some chronic hip pain and takes Tylenol for this twice a day.  She normally ambulates and does not use a walker or cane at home. Patient denies tobacco, alcohol use  ED Course: [name] is found to have a displaced periprosthetic fracture in the mid to distal femur which extends into the intra-articular surface of the knee joint.  Hemodynamically stable.  Labs are unremarkable.  Hospitalist service asked to admit for further management   4/28- no complaints. Daughter at bedside. No cp, or sob  Consultants:   Orthopedics  Procedures:   Antimicrobials:       Subjective: Pain appears to be controlled. No cp or sob  Objective: Vitals:   09/23/20 2200 09/24/20 0000 09/24/20 0019  09/24/20 0500  BP:   130/60 (!) 141/61  Pulse:    83  Resp:   18 19  Temp:   98.2 F (36.8 C) 97.9 F (36.6 C)  TempSrc:   Oral Oral  SpO2: 97% 90% 93% 98%  Weight:      Height:        Intake/Output Summary (Last 24 hours) at 09/24/2020 3299 Last data filed at 09/24/2020 0600 Gross per 24 hour  Intake 1835.13 ml  Output 1525 ml  Net 310.13 ml   Filed Weights   09/22/20 1034  Weight: 54.4 kg    Examination: Calm, comfortable CTA no wheeze rales rhonchi's Regular S1-S2 no gallops Soft benign positive bowel sounds No edema Awake and alert and oriented to person and date Mood and affect appropriate in current setting   Data Reviewed: I have personally reviewed following labs and imaging studies  CBC: Recent Labs  Lab 09/22/20 1057 09/23/20 0313 09/24/20 0646  WBC 9.5 7.0 7.8  NEUTROABS 7.9* 5.2  --   HGB 13.9 11.9* 10.9*  HCT 44.0 37.2 35.0*  MCV 90.9 89.6 92.8  PLT 214 202 242   Basic Metabolic Panel: Recent Labs  Lab 09/22/20 1057 09/23/20 0313 09/24/20 0646  NA 138 138 138  K 4.1 3.9 4.3  CL 105 103 104  CO2 25 25 26   GLUCOSE 117* 123*  106*  BUN 21 18 14   CREATININE 1.08* 1.04* 1.07*  CALCIUM 9.1 8.7* 8.1*   GFR: Estimated Creatinine Clearance: 34.2 mL/min (A) (by C-G formula based on SCr of 1.07 mg/dL (H)). Liver Function Tests: Recent Labs  Lab 09/23/20 0313  AST 22  ALT 13  ALKPHOS 54  BILITOT 1.2  PROT 5.5*  ALBUMIN 3.2*   No results for input(s): LIPASE, AMYLASE in the last 168 hours. No results for input(s): AMMONIA in the last 168 hours. Coagulation Profile: Recent Labs  Lab 09/22/20 1057 09/23/20 0313  INR 1.0 1.1   Cardiac Enzymes: No results for input(s): CKTOTAL, CKMB, CKMBINDEX, TROPONINI in the last 168 hours. BNP (last 3 results) No results for input(s): PROBNP in the last 8760 hours. HbA1C: No results for input(s): HGBA1C in the last 72 hours. CBG: No results for input(s): GLUCAP in the last 168 hours. Lipid  Profile: No results for input(s): CHOL, HDL, LDLCALC, TRIG, CHOLHDL, LDLDIRECT in the last 72 hours. Thyroid Function Tests: No results for input(s): TSH, T4TOTAL, FREET4, T3FREE, THYROIDAB in the last 72 hours. Anemia Panel: No results for input(s): VITAMINB12, FOLATE, FERRITIN, TIBC, IRON, RETICCTPCT in the last 72 hours. Sepsis Labs: No results for input(s): PROCALCITON, LATICACIDVEN in the last 168 hours.  Recent Results (from the past 240 hour(s))  SARS CORONAVIRUS 2 (TAT 6-24 HRS) Nasopharyngeal Nasopharyngeal Swab     Status: None   Collection Time: 09/22/20  5:41 PM   Specimen: Nasopharyngeal Swab  Result Value Ref Range Status   SARS Coronavirus 2 NEGATIVE NEGATIVE Final    Comment: (NOTE) SARS-CoV-2 target nucleic acids are NOT DETECTED.  The SARS-CoV-2 RNA is generally detectable in upper and lower respiratory specimens during the acute phase of infection. Negative results do not preclude SARS-CoV-2 infection, do not rule out co-infections with other pathogens, and should not be used as the sole basis for treatment or other patient management decisions. Negative results must be combined with clinical observations, patient history, and epidemiological information. The expected result is Negative.  Fact Sheet for Patients: SugarRoll.be  Fact Sheet for Healthcare Providers: https://www.woods-mathews.com/  This test is not yet approved or cleared by the [place] FDA and  has been authorized for detection and/or diagnosis of SARS-CoV-2 by FDA under an Emergency Use Authorization (EUA). This EUA will remain  in effect (meaning this test can be used) for the duration of the COVID-19 declaration under Se ction 564(b)(1) of the Act, 21 U.S.C. section 360bbb-3(b)(1), unless the authorization is terminated or revoked sooner.  Performed at [facility], [place] [address], [place], [place] [address]   MRSA PCR Screening      Status: None   Collection Time: 09/23/20  5:59 AM   Specimen: Nasal Mucosa; Nasopharyngeal  Result Value Ref Range Status   MRSA by PCR NEGATIVE NEGATIVE Final    Comment:        The GeneXpert MRSA Assay (FDA approved for NASAL specimens only), is one component of a comprehensive MRSA colonization surveillance program. It is not intended to diagnose MRSA infection nor to guide or monitor treatment for MRSA infections. Performed at [facility], [place] [address] [place], [place] [address]          Radiology Studies: DG Chest 1 View  Result Date: 09/22/2020 CLINICAL DATA:  Fall. EXAM: CHEST  1 VIEW COMPARISON:  02/19/2018. FINDINGS: Mediastinum hilar structures normal. Cardiomegaly. No pulmonary venous congestion. No focal infiltrate. No pleural effusion or pneumothorax. Thoracic spine scoliosis  and degenerative change. No displaced rib fracture. IMPRESSION: 1. Cardiomegaly. No pulmonary venous congestion. No focal infiltrate. 2. Mild degenerative change thoracic spine. No evidence of displaced rib fracture. No pneumothorax. Electronically Signed   By: Marcello Moores  Register   On: 09/22/2020 11:54   DG Elbow 2 Views Right  Result Date: 09/22/2020 CLINICAL DATA:  86 year old female with fall and right elbow pain. EXAM: RIGHT ELBOW - 2 VIEW COMPARISON:  None. FINDINGS: There is no acute fracture or dislocation. The bones are osteopenic. No significant arthritic changes. No joint effusion. The soft tissues unremarkable. IMPRESSION: Negative. Electronically Signed   By: Anner Crete M.D.   On: 09/22/2020 20:26   CT HEAD WO CONTRAST  Result Date: 09/22/2020 CLINICAL DATA:  Multiple falls EXAM: CT HEAD WITHOUT CONTRAST TECHNIQUE: Contiguous axial images were obtained from the base of the skull through the vertex without intravenous contrast. COMPARISON:  February 19, 2018 FINDINGS: Brain: No evidence of acute large vascular territory infarction, hemorrhage, hydrocephalus, extra-axial  collection or mass lesion/mass effect. Stable moderate to severe global parenchymal volume loss with ex vacuo dilatation of ventricular system. Similar moderate to severe burden of chronic small vessel ischemic disease. Vascular: Dolichoectatic intracranial vessels stable from prior imaging seen with chronic hypertension. Scattered atherosclerotic calcifications of the internal carotid and vertebral arteries at the skull base. Skull: Normal. Negative for fracture or focal lesion. Sinuses/Orbits: Opacified right ethmoid air cells, otherwise the paranasal sinuses and mastoid air cells are predominantly clear. Other: None IMPRESSION: 1. No acute intracranial findings. 2. Stable moderate to severe global parenchymal volume loss and similar chronic small vessel ischemic disease. Electronically Signed   By: Dahlia Bailiff MD   On: 09/22/2020 15:10   DG C-Arm 1-60 Min  Result Date: 09/23/2020 CLINICAL DATA:  ORIF right femur. EXAM: DG C-ARM 1-60 MIN; RIGHT FEMUR 2 VIEWS FLUOROSCOPY TIME:  Fluoroscopy Time:  59 seconds. Radiation Exposure Index (if provided by the fluoroscopic device): 4.56 mGy. Number of Acquired Spot Images: 7 COMPARISON:  September 22, 2020 FINDINGS: Seven C-arm fluoroscopic images were obtained intraoperatively and submitted for post operative interpretation. These images demonstrate plate and screw fixation of the femur. Final images demonstrate improved alignment the periprosthetic distal femur fracture. Partially imaged prior intramedullary nail fixation hardware. Please see the performing provider's procedural report for further detail. IMPRESSION: Intraoperative fluoroscopy, as detailed above. Electronically Signed   By: Margaretha Sheffield MD   On: 09/23/2020 14:17   DG Hip Unilat With Pelvis 2-3 Views Right  Result Date: 09/22/2020 CLINICAL DATA:  Fall.  Right hip pain EXAM: DG HIP (WITH OR WITHOUT PELVIS) 2-3V RIGHT COMPARISON:  03/27/2017 FINDINGS: Bones are diffusely osteopenic. Previous  ORIF proximal right femur fracture. IM nail and distal interlocking hip screw are identified. No signs of dislocation or fracture. The distal portion of the IM nail is not included on this current series of films. IMPRESSION: 1. No acute findings. 2. Previous ORIF of proximal right femur fracture. Electronically Signed   By: Kerby Moors M.D.   On: 09/22/2020 11:54   DG FEMUR, MIN 2 VIEWS RIGHT  Result Date: 09/23/2020 CLINICAL DATA:  ORIF right femur. EXAM: DG C-ARM 1-60 MIN; RIGHT FEMUR 2 VIEWS FLUOROSCOPY TIME:  Fluoroscopy Time:  59 seconds. Radiation Exposure Index (if provided by the fluoroscopic device): 4.56 mGy. Number of Acquired Spot Images: 7 COMPARISON:  September 22, 2020 FINDINGS: Seven C-arm fluoroscopic images were obtained intraoperatively and submitted for post operative interpretation. These images demonstrate plate and screw fixation of  the femur. Final images demonstrate improved alignment the periprosthetic distal femur fracture. Partially imaged prior intramedullary nail fixation hardware. Please see the performing provider's procedural report for further detail. IMPRESSION: Intraoperative fluoroscopy, as detailed above. Electronically Signed   By: Margaretha Sheffield MD   On: 09/23/2020 14:17   DG Femur Min 2 Views Right  Result Date: 09/22/2020 CLINICAL DATA:  pain, fall EXAM: RIGHT FEMUR 2 VIEWS COMPARISON:  Right hip radiograph 03/26/2017, fluoroscopic images 03/27/2017 FINDINGS: There is a posteriorly displaced periprosthetic mid to distal femur fracture, with likely intra-articular involvement of the knee joint. Intramedullary fixation hardware is intact but displaced due to the fracture. Old healed proximal femur fracture. Adjacent soft tissue swelling. Laxity of the patellar tendon in part due to knee hyperextension. IMPRESSION: Posteriorly displaced periprosthetic fracture of the mid to distal femur, with likely intra-articular extension to the knee joint. Electronically Signed    By: Maurine Simmering   On: 09/22/2020 15:17   DG FEMUR PORT, MIN 2 VIEWS RIGHT  Result Date: 09/23/2020 CLINICAL DATA:  Postop femur fracture fixation. EXAM: RIGHT FEMUR PORTABLE 2 VIEW COMPARISON:  Preoperative radiograph yesterday. FINDINGS: Interval lateral plate and multi screw fixation of periprosthetic distal femur fracture. The fracture is in improved alignment compared to preoperative imaging. Intramedullary rod with trans trochanteric screw fixation again seen recent postsurgical change includes air and edema in the soft tissues. IMPRESSION: ORIF of periprosthetic distal femur fracture, in improved alignment compared to preoperative imaging. Electronically Signed   By: Keith Rake M.D.   On: 09/23/2020 15:49        Scheduled Meds: . amLODipine  5 mg Oral Daily  . Chlorhexidine Gluconate Cloth  6 each Topical Daily  . docusate sodium  100 mg Oral BID  . enoxaparin (LOVENOX) injection  40 mg Subcutaneous Q24H  . galantamine  24 mg Oral q AM  . lactose free nutrition  237 mL Oral Q24H  . levothyroxine  88 mcg Oral QAC breakfast  . memantine  28 mg Oral QHS  . mirtazapine  15 mg Oral QHS  . multivitamin with minerals  1 tablet Oral Daily  . QUEtiapine  25 mg Oral QHS   Continuous Infusions: . 0.9 % NaCl with KCl 20 mEq / L 100 mL/hr at 09/24/20 0600  .  ceFAZolin (ANCEF) IV 200 mL/hr at 09/24/20 0600  . lactated ringers Stopped (09/23/20 1017)    Assessment & Plan:   Active Problems:   Essential hypertension   Alzheimer's dementia ([place])   Closed fracture of shaft of right femur (HCC)   CKD (chronic kidney disease) stage 3, GFR 30-59 ml/min (HCC)  Closed fracture of shaft of right femur  Status post ORIF on 4/27  Ortho following  Pain control  Foley placed Will do voiding trial in a.m. once patient begins ambulating  PT OT recommend SNF   Essential hypertension Overall stable continue amlodipine    CKD (chronic kidney disease) stage 3 A Stable continue to  monitor  Post op blood loss- Hg dropped but no need for transfusion. Hg 10.9 Continue to monitor     Alzheimer's dementia  Continue Namenda and Razadyne    DVT prophylaxis: SCD, lovenox Code Status: Full Family Communication: Daughter at bedside  Status is: Inpatient  Remains inpatient appropriate because:Inpatient level of care appropriate due to severity of illness   Dispo: The patient is from: Home              Anticipated d/c is to: TBD  Patient currently is not medically stable to d/c.   Difficult to place patient No            LOS: 2 days   Time spent: 35 minutes with more than 50% COC    Nolberto Hanlon, MD [facility] Pager [phone]  If 7PM-7AM, please contact night-coverage 09/24/2020, 8:23 AM

## 2020-09-24 NOTE — Evaluation (Addendum)
Occupational Therapy Evaluation Patient Details Name: Julia [name] MRN: [id number] DOB: 1934/09/18 Today's Date: 09/24/2020    History of Present Illness 86 yo female s/p ORIF R distal femur fracture due to fall at home sustaining R periprosthetic femur fx. PMH includes CKD 3, hypothyroidism, HTN, Alzheimer's dementia, fall with R hip fracture and repair 2018, osteopenia.   Clinical Impression   Pt admitted to the ED for concerns listed above. PTA pt lives at home with her husband, completes all functional mobility and basic ADL's independently, all with no DME. Pt also is able to fix her self simple meals and assist with housekeeping/chores. Family assists with medication management and finances. At the time of the evaluation, pt was limited by pain, requiring max assist +2 for all functional mobility, however pt was able to minimally weightbear on her RLE. Additionally pt was able to complete basic self care seated EOB for 5 mins. Pt will continue to benefit from OT services, acute OT to follow.     Follow Up Recommendations  SNF    Equipment Recommendations  None recommended by OT    Recommendations for Other Services       Precautions / Restrictions Precautions Precautions: Fall Restrictions Weight Bearing Restrictions: Yes RLE Weight Bearing: Weight bearing as tolerated Other Position/Activity Restrictions: unlimited ROM RLE      Mobility Bed Mobility Overal bed mobility: Needs Assistance Bed Mobility: Supine to Sit     Supine to sit: Total assist;+2 for physical assistance     General bed mobility comments: total +2 for helicopter method to move to EOB, pt resistant to mobility initially secondary to pain.    Transfers Overall transfer level: Needs assistance Equipment used: 2 person hand held assist Transfers: Sit to/from [facility] Sit to Stand: Max assist;+2 physical assistance Stand pivot transfers: Max assist;+2 physical assistance        General transfer comment: max +2 for power up, rise, steady, and pivot on LLE to reach recliner. Pt with minimal WB through RLE secondary to pain. Transfer successful transferring towards L.    Balance Overall balance assessment: Needs assistance;History of Falls Sitting-balance support: No upper extremity supported;Feet supported Sitting balance-Leahy Scale: Fair Sitting balance - Comments: able to sit EOB without PT/OT support after gaining initial balance   Standing balance support: Bilateral upper extremity supported;During functional activity Standing balance-Leahy Scale: Zero Standing balance comment: max +2 to perform                           ADL either performed or assessed with clinical judgement   ADL Overall ADL's : Needs assistance/impaired Eating/Feeding: Set up;Sitting Eating/Feeding Details (indicate cue type and reason): Pt able to feed herself after lunch tray was set up in front of her and meat was cut by daughter. Grooming: Wash/dry hands;Wash/dry face;Min guard;Sitting Grooming Details (indicate cue type and reason): Pt able to complete grooming seated EOB Upper Body Bathing: Minimal assistance;Sitting;Moderate assistance Upper Body Bathing Details (indicate cue type and reason): Pt required assistance having her back washed Lower Body Bathing: Maximal assistance;Sitting/lateral leans;Sit to/from stand   Upper Body Dressing : Min guard;Sitting Upper Body Dressing Details (indicate cue type and reason): able to don hospital gown seated EOB Lower Body Dressing: Total assistance;Sitting/lateral leans;Sit to/from stand Lower Body Dressing Details (indicate cue type and reason): Pt requires complete assistance at this time to reach her feet to don and doff socks/underwear Toilet Transfer: Maximal assistance;+2 for physical assistance;+2  for safety/equipment;Stand-pivot Toilet Transfer Details (indicate cue type and reason): simulated transferring to  recliner, HHA with PT/OT on either side, providing most of the lifting for pt to transfer to chair Toileting- Clothing Manipulation and Hygiene: Total assistance;+2 for physical assistance;+2 for safety/equipment;Sitting/lateral lean;Sit to/from stand   Tub/ Shower Transfer: Maximal assistance;+2 for physical assistance;+2 for safety/equipment;Stand-pivot Tub/Shower Transfer Details (indicate cue type and reason): simulated transferring to recliner, HHA by PT/OT on either side lifting pt. Functional mobility during ADLs: Maximal assistance;+2 for safety/equipment;+2 for physical assistance General ADL Comments: OT/PT provided most assistance with functional mobility. Pt able to tolerate some weight on RLE when transferring, however unable to assist with movement of RLE for transfer.     Vision Baseline Vision/History: Glaucoma Patient Visual Report: No change from baseline Vision Assessment?: No apparent visual deficits     Perception Perception Perception Tested?: No   Praxis Praxis Praxis tested?: Not tested    Pertinent Vitals/Pain Pain Assessment: Faces Faces Pain Scale: Hurts even more Pain Location: R hip, knee Pain Descriptors / Indicators: Sore;Discomfort;Grimacing;Guarding Pain Intervention(s): Monitored during session;Premedicated before session;Repositioned;Limited activity within patient's tolerance     Hand Dominance Right   Extremity/Trunk Assessment Upper Extremity Assessment Upper Extremity Assessment: Generalized weakness   Lower Extremity Assessment Lower Extremity Assessment: Defer to PT evaluation RLE Deficits / Details: able to perform full ROM knee extension in sitting with active assist at end range extension; performs ankle pumps, toe flex/ext without assist RLE: Unable to fully assess due to pain   Cervical / Trunk Assessment Cervical / Trunk Assessment: Kyphotic   Communication Communication Communication: No difficulties   Cognition  Arousal/Alertness: Awake/alert Behavior During Therapy: Restless;Anxious Overall Cognitive Status: History of cognitive impairments - at baseline                                 General Comments: history of dementia; pt oriented to self but not time, place or situation. Pt repeating fall story multiple times during session, cannot state her relationship to daughter at bedside without prompting. Pt is anxious about mobility, requiring short cues and decisive movement assist from PT/OT   General Comments  Pt with sloughing skin on back, pt's daughter washing dead skin off with wet washcloths while sitting EOB. Bruising and possibly small skin tear to L posterior flank, as well as skin tears on R elbow from fall on gravel.      Home Living Family/patient expects to be discharged to:: Skilled nursing facility                                        Prior Functioning/Environment Level of Independence: Needs assistance  Gait / Transfers Assistance Needed: pt ambulatory without AD PTA, but per pt's daughter was falling frequently and declines to use RW/cane ADL's / Homemaking Assistance Needed: Pt does her own dressing, bathing, simple food prep at baseline;   Comments: pt with baseline of dementia, pt's daughter helps with a majority of subjective information        OT Problem List: Decreased strength;Decreased activity tolerance;Impaired balance (sitting and/or standing);Decreased safety awareness;Decreased cognition;Decreased knowledge of use of DME or AE;Pain      OT Treatment/Interventions: Self-care/ADL training;Therapeutic exercise;Energy conservation;DME and/or AE instruction;Therapeutic activities;Cognitive remediation/compensation;Patient/family education;Balance training    OT Goals(Current goals can be found in the care plan section) Acute  Rehab OT Goals Patient Stated Goal: feel better OT Goal Formulation: With patient/family Time For Goal  Achievement: 10/08/20 Potential to Achieve Goals: Fair ADL Goals Pt Will Perform Grooming: with supervision;sitting Pt Will Perform Lower Body Dressing: with min assist;sitting/lateral leans Pt Will Transfer to Toilet: with mod assist;stand pivot transfer;bedside commode Additional ADL Goal #1: Pt able to tolerate sitting EOB for 5 mins with no support for seated basic ADL's  OT Frequency: Min 2X/week   Barriers to D/C:            Co-evaluation PT/OT/SLP Co-Evaluation/Treatment: Yes Reason for Co-Treatment: For patient/therapist safety;To address functional/ADL transfers PT goals addressed during session: Balance;Mobility/safety with mobility OT goals addressed during session: Strengthening/ROM;ADL's and self-care      AM-PAC OT "6 Clicks" Daily Activity     Outcome Measure Help from another person eating meals?: A Little Help from another person taking care of personal grooming?: A Little Help from another person toileting, which includes using toliet, bedpan, or urinal?: A Lot Help from another person bathing (including washing, rinsing, drying)?: A Lot Help from another person to put on and taking off regular upper body clothing?: A Little Help from another person to put on and taking off regular lower body clothing?: Total 6 Click Score: 14   End of Session Nurse Communication: Mobility status  Activity Tolerance: Patient limited by pain Patient left: in chair;with call bell/phone within reach;with chair alarm set;with family/visitor present  OT Visit Diagnosis: Unsteadiness on feet (R26.81);Repeated falls (R29.6);Muscle weakness (generalized) (M62.81);History of falling (Z91.81)                Time: 1132-1200 OT Time Calculation (min): 28 min Charges:  OT General Charges $OT Visit: 1 Visit OT Evaluation $OT Eval Moderate Complexity: 1 Mod  Zykira Matlack H., OTR/L Acute Rehabilitation  Kobie Matkins Elane Yolanda Bonine 09/24/2020, 2:26 PM

## 2020-09-24 NOTE — Progress Notes (Addendum)
Orthopaedic Trauma Progress Note  SUBJECTIVE: Reports minimal pain about operative site currently at rest. Note that pain increases with any movement. No chest pain. No SOB. No nausea/vomiting. No other complaints.  Has not been out of bed yet. Daughter at bedside.  Daughter notes that patient got very disoriented last night after taking a dose of hydrocodone.  Is asking if pain medication can be switched to something else.  We will try tramadol today  OBJECTIVE:  Vitals:   09/24/20 0019 09/24/20 0500  BP: 130/60 (!) 141/61  Pulse:  83  Resp: 18 19  Temp: 98.2 F (36.8 C) 97.9 F (36.6 C)  SpO2: 93% 98%    General: Sitting up in bed, NAD. P{leasant and cooperative Respiratory: No increased work of breathing.  Right lower extremity: Dressing clean, dry, intact. Mildly tender over the knee to the distal thigh as expected. Tolerates very small amount of passive knee motion. Ankle DF/PF intact. Endorses sensation to light touch. Foot warm and well perfused  IMAGING: Stable post op imaging.   LABS:  Results for orders placed or performed during the hospital encounter of 09/22/20 (from the past 24 hour(s))  Basic metabolic panel     Status: Abnormal   Collection Time: 09/24/20  6:46 AM  Result Value Ref Range   Sodium 138 135 - 145 mmol/L   Potassium 4.3 3.5 - 5.1 mmol/L   Chloride 104 98 - 111 mmol/L   CO2 26 22 - 32 mmol/L   Glucose, Bld 106 (H) 70 - 99 mg/dL   BUN 14 8 - 23 mg/dL   Creatinine, Ser 1.07 (H) 0.44 - 1.00 mg/dL   Calcium 8.1 (L) 8.9 - 10.3 mg/dL   GFR, Estimated 52 (L) >60 mL/min   Anion gap 8 5 - 15  CBC     Status: Abnormal   Collection Time: 09/24/20  6:46 AM  Result Value Ref Range   WBC 7.8 4.0 - 10.5 K/uL   RBC 3.77 (L) 3.87 - 5.11 MIL/uL   Hemoglobin 10.9 (L) 12.0 - 15.0 g/dL   HCT 35.0 (L) 36.0 - 46.0 %   MCV 92.8 80.0 - 100.0 fL   MCH 28.9 26.0 - 34.0 pg   MCHC 31.1 30.0 - 36.0 g/dL   RDW 13.3 11.5 - 15.5 %   Platelets 160 150 - 400 K/uL   nRBC 0.0  0.0 - 0.2 %    ASSESSMENT: Julia [name] is a 86 y.o. female, 1 Day Post-Op s/p ORIF RIGHT PERIPROSTHETIC DISTAL FEMUR FRACTURE  CV/Blood loss: Acute blood loss anemia, Hgb 10.9 this AM. Hemodynamically stable  PLAN: Weightbearing: WBAT RLE.  Okay for unrestricted range of motion of the knee Incisional and dressing care: Reinforce dressings as needed  Showering: Do not get right lower extremity dressing wet Orthopedic device(s): None  Pain management:  1. Tylenol 650 mg q 6 hours PRN 2. Tramadol 50 mg q 6 hours PRN 3. Dilaudid 0.5 mg q 3 hours PRN VTE prophylaxis: Lovenox, SCDs ID: Ancef 2gm post op Foley/Lines: Foley catheter in place, remove today if able.  KVO IVFs Impediments to Fracture Healing: Vitamin D level 14, will start on D3 supplementation Dispo: PT/OT evaluation today, dispo pending.  Will likely needs SNF Follow - up plan: 2 weeks after discharge for repeat x-rays and wound check  Contact information:  Katha Hamming MD, Patrecia Pace PA-C. After hours and holidays please check [email or web address] for group call information for [facility]   Mayson Mcneish A. Ricci Barker,  PA-C [phone] (office) [email or web address]

## 2020-09-24 NOTE — Evaluation (Signed)
Physical Therapy Evaluation Patient Details Name: Julia [name] MRN: [id number] DOB: Mar 15, 1935 Today's Date: 09/24/2020   History of Present Illness  86 yo female s/p ORIF R distal femur fracture due to fall at home sustaining R periprosthetic femur fx. PMH includes CKD 3, hypothyroidism, HTN, Alzheimer's dementia, fall with R hip fracture and repair 2018, osteopenia.  Clinical Impression   Pt presents with generalized weakness, moderate to severe RLE pain post-operatively, impaired balance, difficulty performing mobility tasks, and decreased activity tolerance vs baseline. Pt to benefit from acute PT to address deficits. Pt requiring max +2 for bed mobility and transfer to recliner today, pt minimally WB through RLE secondary to pain and anxiety. PT recommending SNF level of care post-acutely. PT to progress mobility as tolerated, and will continue to follow acutely.     Follow Up Recommendations SNF    Equipment Recommendations  None recommended by PT    Recommendations for Other Services       Precautions / Restrictions Precautions Precautions: Fall Restrictions Weight Bearing Restrictions: No RLE Weight Bearing: Weight bearing as tolerated Other Position/Activity Restrictions: unlimited ROM RLE      Mobility  Bed Mobility Overal bed mobility: Needs Assistance Bed Mobility: Supine to Sit     Supine to sit: Total assist;+2 for physical assistance     General bed mobility comments: total +2 for helicopter method to move to EOB, pt resistant to mobility initially secondary to pain.    Transfers Overall transfer level: Needs assistance Equipment used: 2 person hand held assist Transfers: Sit to/from [facility] Sit to Stand: Max assist;+2 physical assistance Stand pivot transfers: Max assist;+2 physical assistance       General transfer comment: max +2 for power up, rise, steady, and pivot on LLE to reach recliner. Pt with minimal WB through RLE  secondary to pain. Transfer successful transferring towards L.  Ambulation/Gait             General Gait Details: NT  Stairs            Wheelchair Mobility    Modified Rankin (Stroke Patients Only)       Balance Overall balance assessment: Needs assistance;History of Falls Sitting-balance support: No upper extremity supported;Feet supported Sitting balance-Leahy Scale: Fair Sitting balance - Comments: able to sit EOB without PT/OT support after gaining initial balance   Standing balance support: Bilateral upper extremity supported;During functional activity Standing balance-Leahy Scale: Zero Standing balance comment: max +2 to perform                             Pertinent Vitals/Pain Pain Assessment: Faces Faces Pain Scale: Hurts even more Pain Location: R hip, knee Pain Descriptors / Indicators: Sore;Discomfort;Grimacing;Guarding Pain Intervention(s): Limited activity within patient's tolerance;Monitored during session;Repositioned;Premedicated before session    Home Living Family/patient expects to be discharged to:: Skilled nursing facility                      Prior Function Level of Independence: Needs assistance   Gait / Transfers Assistance Needed: pt ambulatory without AD PTA, but per pt's daughter was falling frequently and declines to use RW/cane  ADL's / Homemaking Assistance Needed: Pt does her own dressing, bathing, simple food prep at baseline;  Comments: pt with baseline of dementia, pt's daughter helps with a majority of subjective information     Hand Dominance   Dominant Hand: Right    Extremity/Trunk Assessment  Upper Extremity Assessment Upper Extremity Assessment: Defer to OT evaluation    Lower Extremity Assessment Lower Extremity Assessment: Generalized weakness;RLE deficits/detail RLE Deficits / Details: able to perform full ROM knee extension in sitting with active assist at end range extension; performs  ankle pumps, toe flex/ext without assist RLE: Unable to fully assess due to pain    Cervical / Trunk Assessment Cervical / Trunk Assessment: Kyphotic  Communication   Communication: No difficulties  Cognition Arousal/Alertness: Awake/alert Behavior During Therapy: Restless;Anxious Overall Cognitive Status: History of cognitive impairments - at baseline                                 General Comments: history of dementia; pt oriented to self but not time, place or situation. Pt repeating fall story multiple times during session, cannot state her relationship to daughter at bedside without prompting. Pt is anxious about mobility, requiring short cues and decisive movement assist from PT/OT      General Comments General comments (skin integrity, edema, etc.): Pt with sloughing skin on back, pt's daughter washing dead skin off with wet washcloths while sitting EOB. Bruising and possibly small skin tear to L posterior flank, as well as skin tears on R elbow from fall on gravel.    Exercises General Exercises - Lower Extremity Long Arc Quad: AAROM;Right;5 reps;Seated   Assessment/Plan    PT Assessment Patient needs continued PT services  PT Problem List Decreased strength;Decreased mobility;Decreased activity tolerance;Decreased balance;Decreased knowledge of use of DME;Pain;Decreased range of motion;Decreased safety awareness;Decreased cognition       PT Treatment Interventions DME instruction;Therapeutic activities;Gait training;Patient/family education;Therapeutic exercise;Balance training;Functional mobility training;Neuromuscular re-education    PT Goals (Current goals can be found in the Care Plan section)  Acute Rehab PT Goals Patient Stated Goal: feel better PT Goal Formulation: With patient Time For Goal Achievement: 10/08/20 Potential to Achieve Goals: Good    Frequency Min 3X/week   Barriers to discharge        Co-evaluation PT/OT/SLP  Co-Evaluation/Treatment: Yes Reason for Co-Treatment: For patient/therapist safety;To address functional/ADL transfers PT goals addressed during session: Balance;Mobility/safety with mobility OT goals addressed during session: Strengthening/ROM       AM-PAC PT "6 Clicks" Mobility  Outcome Measure Help needed turning from your back to your side while in a flat bed without using bedrails?: A Lot Help needed moving from lying on your back to sitting on the side of a flat bed without using bedrails?: Total Help needed moving to and from a bed to a chair (including a wheelchair)?: Total Help needed standing up from a chair using your arms (e.g., wheelchair or bedside chair)?: Total Help needed to walk in hospital room?: Total Help needed climbing 3-5 steps with a railing? : Total 6 Click Score: 7    End of Session   Activity Tolerance: Patient limited by fatigue;Patient limited by pain Patient left: in chair;with call bell/phone within reach;with chair alarm set;with family/visitor present Nurse Communication: Mobility status PT Visit Diagnosis: Other abnormalities of gait and mobility (R26.89);Difficulty in walking, not elsewhere classified (R26.2);Pain Pain - Right/Left: Right Pain - part of body: Hip;Knee;Leg    Time: 7782-4235 PT Time Calculation (min) (ACUTE ONLY): 26 min   Charges:   PT Evaluation $PT Eval Low Complexity: 1 Low          Stacie Glaze, PT DPT Acute Rehabilitation Services Pager [phone]  Office [phone]   Louis Matte 09/24/2020,  1:46 PM

## 2020-09-25 DIAGNOSIS — I10 Essential (primary) hypertension: Secondary | ICD-10-CM | POA: Diagnosis not present

## 2020-09-25 DIAGNOSIS — N1832 Chronic kidney disease, stage 3b: Secondary | ICD-10-CM | POA: Diagnosis not present

## 2020-09-25 DIAGNOSIS — G309 Alzheimer's disease, unspecified: Secondary | ICD-10-CM | POA: Diagnosis not present

## 2020-09-25 DIAGNOSIS — S72391S Other fracture of shaft of right femur, sequela: Secondary | ICD-10-CM | POA: Diagnosis not present

## 2020-09-25 LAB — CBC
HCT: 31.7 % — ABNORMAL LOW (ref 36.0–46.0)
Hemoglobin: 10.2 g/dL — ABNORMAL LOW (ref 12.0–15.0)
MCH: 29.3 pg (ref 26.0–34.0)
MCHC: 32.2 g/dL (ref 30.0–36.0)
MCV: 91.1 fL (ref 80.0–100.0)
Platelets: 153 10*3/uL (ref 150–400)
RBC: 3.48 MIL/uL — ABNORMAL LOW (ref 3.87–5.11)
RDW: 12.9 % (ref 11.5–15.5)
WBC: 6.7 10*3/uL (ref 4.0–10.5)
nRBC: 0 % (ref 0.0–0.2)

## 2020-09-25 LAB — BASIC METABOLIC PANEL
Anion gap: 4 — ABNORMAL LOW (ref 5–15)
BUN: 10 mg/dL (ref 8–23)
CO2: 28 mmol/L (ref 22–32)
Calcium: 8.1 mg/dL — ABNORMAL LOW (ref 8.9–10.3)
Chloride: 103 mmol/L (ref 98–111)
Creatinine, Ser: 0.93 mg/dL (ref 0.44–1.00)
GFR, Estimated: >60 mL/min (ref >60)
Glucose, Bld: 133 mg/dL — ABNORMAL HIGH (ref 70–99)
Potassium: 4.4 mmol/L (ref 3.5–5.1)
Sodium: 135 mmol/L (ref 135–145)

## 2020-09-25 MED ORDER — VITAMIN D3 25 MCG PO TABS: 2000.0000 [IU] | ORAL_TABLET | Freq: Every day | ORAL | 0 refills | Status: AC

## 2020-09-25 MED ORDER — DOCUSATE SODIUM 100 MG PO CAPS: 100.0000 mg | ORAL_CAPSULE | Freq: Two times a day (BID) | ORAL | 0 refills | Status: AC

## 2020-09-25 MED ORDER — TRAMADOL HCL 50 MG PO TABS: 50.0000 mg | ORAL_TABLET | Freq: Two times a day (BID) | ORAL | 0 refills | Status: AC | PRN

## 2020-09-25 MED ORDER — FLEET ENEMA 7-19 GM/118ML RE ENEM
1.0000 | ENEMA | Freq: Once | RECTAL | Status: AC
  Administered 2020-09-25: 1 via RECTAL
  Filled 2020-09-25: qty 1

## 2020-09-25 MED ORDER — ENOXAPARIN SODIUM 40 MG/0.4ML ~~LOC~~ SOLN: 40.0000 mg | SUBCUTANEOUS | 0 refills | Status: AC

## 2020-09-25 MED ORDER — SENNOSIDES-DOCUSATE SODIUM 8.6-50 MG PO TABS
1.0000 | ORAL_TABLET | Freq: Two times a day (BID) | ORAL | Status: DC
  Administered 2020-09-25 – 2020-09-28 (×7): 1 via ORAL
  Filled 2020-09-25 (×7): qty 1

## 2020-09-25 MED ORDER — POLYETHYLENE GLYCOL 3350 17 G PO PACK
17.0000 g | PACK | Freq: Two times a day (BID) | ORAL | Status: DC
  Administered 2020-09-25 – 2020-09-28 (×7): 17 g via ORAL
  Filled 2020-09-25 (×7): qty 1

## 2020-09-25 NOTE — Progress Notes (Signed)
Inpatient Rehab Admissions Coordinator:   Consult received and chart reviewed.  Note therapies are recommending SNF at this time and [facility] Medicare highly unlikely to approve CIR for a diagnosis of hip fracture regardless of recommendations.  Will sign off for CIR at this time and agree with SNF recs.   Shann Medal, PT, DPT Admissions Coordinator [phone] 09/25/20  10:50 AM

## 2020-09-25 NOTE — TOC Progression Note (Signed)
Transition of Care [facility]) - Progression Note    Patient Details  Name: Julia [name] MRN: [id number] Date of Birth: [birth year]  Transition of Care [facility]) CM/SW Contact  Milinda Antis, [place] Phone Number: 09/25/2020, 2:55 PM  Clinical Narrative:    CSW presented bed offers and family would like to go with [facility].    CSW called [facility] and was informed that they can only give a bed offer the day of.  CSW called to inform family of this information.       Expected Discharge Plan: [place] Barriers to Discharge: [place] Work up,SNF Pending bed offer  Expected Discharge Plan and Services Expected Discharge Plan: [place] arrangements for the past 2 months: Single Family Home                                       Social Determinants of Health (SDOH) Interventions    Readmission Risk Interventions No flowsheet data found.

## 2020-09-25 NOTE — TOC Initial Note (Signed)
Transition of Care [facility]) - Initial/Assessment Note    Patient Details  Name: Julia [name] MRN: [id number] Date of Birth: 1934/08/01  Transition of Care [facility]) CM/SW Contact:    Julia [name], [place] Phone Number: 09/25/2020, 10:08 AM  Clinical Narrative:                 CSW received consult for possible SNF placement at time of discharge. CSW spoke with patient's daughter, Julia [name], due to the patients having dementia and being disoriented. Patient's daughter expressed understanding of PT recommendation and is agreeable to SNF placement at time of discharge. Patient's daughter did not express preference but wanted the place to be close to her address of [place], [phone].   CSW discussed insurance authorization process and provided Medicare SNF ratings list. Patient has received the COVID vaccines.    No further questions reported at this time.   Expected Discharge Plan: Skilled Nursing Facility Barriers to Discharge: Insurance Authorization,Continued Medical Work up,SNF Pending bed offer   Patient Goals and CMS Choice Patient states their goals for this hospitalization and ongoing recovery are:: Patient has dementia CMS Medicare.gov Compare Post Acute Care list provided to:: Patient Represenative (must comment) (Patients adult daughter) Choice offered to / list presented to : Adult Children  Expected Discharge Plan and Services Expected Discharge Plan: [place] arrangements for the past 2 months: Single Family Home                                      Prior Living Arrangements/Services Living arrangements for the past 2 months: Single Family Home Lives with:: Spouse Patient language and need for interpreter reviewed:: Yes Do you feel safe going back to the place where you live?: Yes        Care giver support system in place?: Yes (comment)   Criminal Activity/Legal Involvement Pertinent to Current Situation/Hospitalization:  No - Comment as needed  Activities of Daily Living      Permission Sought/Granted   Permission granted to share information with : Yes, Verbal Permission Granted (From patient's daughter)     Permission granted to share info w AGENCY: SNF        Emotional Assessment Appearance::  (assessment completed by phone)     Orientation: : Oriented to Self Alcohol / Substance Use: Not Applicable Psych Involvement: No (comment)  Admission diagnosis:  Pain [R52] Closed fracture of shaft of right femur, unspecified fracture morphology, initial encounter ([place]) [S72.301A] Closed fracture of right femur, unspecified fracture morphology, initial encounter ([place]) [S72.91XA] Patient Active Problem List   Diagnosis Date Noted  . Closed fracture of shaft of right femur ([place]) 09/22/2020  . CKD (chronic kidney disease) stage 3, GFR 30-59 ml/min (HCC) 09/22/2020  . Closed intertrochanteric fracture of hip, right, initial encounter ([place]) 03/27/2017  . Mild dementia ([place]) 03/27/2017  . Leukocytosis 03/27/2017  . Closed fracture of right hip ([place])   . Closed nondisplaced fracture of base of neck of right femur ([place]) 11/07/2016  . Sinus bradycardia 09/05/2014  . Junctional escape rhythm 09/05/2014  . Alzheimer's dementia ([place]) 04/21/2014  . Overactive bladder 04/21/2014  . Hypothyroidism, acquired 11/21/2013  . PALPITATIONS, RECURRENT 12/09/2008  . Hyperlipidemia 04/18/2007  . Essential hypertension 12/11/2006  . GERD 12/11/2006  . Osteopenia 12/11/2006  . History of colonic polyps 12/11/2006   PCP:  Julia Peng, NP  Pharmacy:   [facility] [place], [place] - [address] [place] [address] Phone: [phone] Fax: [phone]     Social Determinants of Health (SDOH) Interventions    Readmission Risk Interventions No flowsheet data found.

## 2020-09-25 NOTE — Plan of Care (Signed)

## 2020-09-25 NOTE — NC FL2 (Signed)
[place] LEVEL OF CARE SCREENING TOOL     IDENTIFICATION  Patient Name: Julia [name] Birthdate: May 22, 1935 Sex: female Admission Date (Current Location): 09/22/2020  [facility] and [place] Number:  Herbalist and Address:  [facility] [place], [place] [address], [place], [place] [address]      Provider Number: [id number]  Attending Physician Name and Address:  Nolberto Hanlon, MD  Relative Name and Phone Number:  Idelle Leech Daughter     [phone]    Current Level of Care: Hospital Recommended Level of Care: [place] Prior Approval Number:    Date Approved/Denied:   PASRR Number: [id number]  Discharge Plan: SNF    Current Diagnoses: Patient Active Problem List   Diagnosis Date Noted  . Closed fracture of shaft of right femur ([place]) 09/22/2020  . CKD (chronic kidney disease) stage 3, GFR 30-59 ml/min (HCC) 09/22/2020  . Closed intertrochanteric fracture of hip, right, initial encounter ([place]) 03/27/2017  . Mild dementia ([place]) 03/27/2017  . Leukocytosis 03/27/2017  . Closed fracture of right hip ([place])   . Closed nondisplaced fracture of base of neck of right femur ([place]) 11/07/2016  . Sinus bradycardia 09/05/2014  . Junctional escape rhythm 09/05/2014  . Alzheimer's dementia ([place]) 04/21/2014  . Overactive bladder 04/21/2014  . Hypothyroidism, acquired 11/21/2013  . PALPITATIONS, RECURRENT 12/09/2008  . Hyperlipidemia 04/18/2007  . Essential hypertension 12/11/2006  . GERD 12/11/2006  . Osteopenia 12/11/2006  . History of colonic polyps 12/11/2006    Orientation RESPIRATION BLADDER Height & Weight     Self  Normal Incontinent Weight: 120 lb (54.4 kg) Height:  5\' 5"  (165.1 cm)  BEHAVIORAL SYMPTOMS/MOOD NEUROLOGICAL BOWEL NUTRITION STATUS      Incontinent Diet (see d/c summary)  AMBULATORY STATUS COMMUNICATION OF NEEDS Skin   Extensive Assist Verbally Surgical wounds                       Personal  Care Assistance Level of Assistance  Dressing,Feeding,Bathing Bathing Assistance: Limited assistance   Dressing Assistance: Limited assistance     Functional Limitations Info  Hearing,Speech,Sight Sight Info: Adequate Hearing Info: Adequate Speech Info: Adequate    SPECIAL CARE FACTORS FREQUENCY  PT (By licensed PT),OT (By licensed OT)     PT Frequency: 5x/ week OT Frequency: 5x/week            Contractures Contractures Info: Not present    Additional Factors Info  Allergies,Code Status Code Status Info: Full Allergies Info: Entex,  Sulfonamide Derivatives           Current Medications (09/25/2020):  This is the current hospital active medication list Current Facility-Administered Medications  Medication Dose Route Frequency Provider Last Rate Last Admin  . 0.9 % NaCl with KCl 20 mEq/ L  infusion   Intravenous Continuous Delray Alt, PA-C 100 mL/hr at 09/25/20 0207 New Bag at 09/25/20 0207  . acetaminophen (TYLENOL) tablet 650 mg  650 mg Oral Q6H PRN Delray Alt, PA-C   650 mg at 09/23/20 2028  . amLODipine (NORVASC) tablet 5 mg  5 mg Oral Daily Patrecia Pace A, PA-C   5 mg at 09/25/20 0836  . Chlorhexidine Gluconate Cloth 2 % PADS 6 each  6 each Topical Daily Nolberto Hanlon, MD   6 each at 09/25/20 [phone]  . cholecalciferol (VITAMIN D3) tablet 2,000 Units  2,000 Units Oral Daily Delray Alt, PA-C   2,000 Units at 09/25/20 9518  .  docusate sodium (COLACE) capsule 100 mg  100 mg Oral BID Patrecia Pace A, PA-C   100 mg at 09/25/20 8657  . enoxaparin (LOVENOX) injection 40 mg  40 mg Subcutaneous Q24H Patrecia Pace A, PA-C   40 mg at 09/25/20 8469  . galantamine (RAZADYNE ER) 24 hr capsule 24 mg  24 mg Oral q AM Patrecia Pace A, PA-C   24 mg at 09/25/20 6295  . HYDROmorphone (DILAUDID) injection 0.5 mg  0.5 mg Intravenous Q3H PRN Delray Alt, PA-C   0.5 mg at 09/24/20 0203  . lactose free nutrition (BOOST PLUS) liquid 237 mL  237 mL Oral Q24H Patrecia Pace A, PA-C    237 mL at 09/24/20 1200  . levothyroxine (SYNTHROID) tablet 88 mcg  88 mcg Oral QAC breakfast Delray Alt, PA-C   88 mcg at 09/25/20 2841  . memantine (NAMENDA XR) 24 hr capsule 28 mg  28 mg Oral QHS Patrecia Pace A, PA-C   28 mg at 09/24/20 2221  . metoCLOPramide (REGLAN) tablet 5-10 mg  5-10 mg Oral Q8H PRN Patrecia Pace A, PA-C       Or  . metoCLOPramide (REGLAN) injection 5-10 mg  5-10 mg Intravenous Q8H PRN Delray Alt, PA-C      . mirtazapine (REMERON) tablet 15 mg  15 mg Oral QHS Patrecia Pace A, PA-C   15 mg at 09/24/20 2221  . multivitamin with minerals tablet 1 tablet  1 tablet Oral Daily Delray Alt, PA-C   1 tablet at 09/25/20 0836  . ondansetron (ZOFRAN) tablet 4 mg  4 mg Oral Q6H PRN Patrecia Pace A, PA-C       Or  . ondansetron [facility]) injection 4 mg  4 mg Intravenous Q6H PRN Delray Alt, PA-C      . polyethylene glycol (MIRALAX / GLYCOLAX) packet 17 g  17 g Oral Daily PRN Delray Alt, PA-C      . QUEtiapine (SEROQUEL) tablet 25 mg  25 mg Oral QHS Patrecia Pace A, PA-C   25 mg at 09/24/20 2221  . senna-docusate (Senokot-S) tablet 1 tablet  1 tablet Oral QHS PRN Delray Alt, PA-C      . traMADol Veatrice Bourbon) tablet 50 mg  50 mg Oral Q12H PRN Delray Alt, PA-C   50 mg at 09/24/20 2300     Discharge Medications: Please see discharge summary for a list of discharge medications.  Relevant Imaging Results:  Relevant Lab Results:   Additional Information SS#: [id number],   COVID vaccinations- Moderna COVID-19 Vaccine 07/11/2019 , 06/13/2019  Milinda Antis, LCSWA

## 2020-09-25 NOTE — Progress Notes (Addendum)
Orthopaedic Trauma Progress Note  SUBJECTIVE: Reports minimal pain about operative site currently at rest.  Did well with medication changed to tramadol yesterday.  Had a much better night per her daughter.  No chest pain. No SOB. No nausea/vomiting. No other complaints. Daughter at bedside, she is asking about getting patient's Foley catheter out today.  Also inquiring about repeat urine culture to ensure infection has cleared..    OBJECTIVE:  Vitals:   09/25/20 0617 09/25/20 0805  BP: (!) 156/68 (!) 147/62  Pulse: 93 87  Resp: 16 17  Temp: 98.5 F (36.9 C) 98 F (36.7 C)  SpO2: 92% 91%    General: Sitting up in bed, NAD. Pleasant and cooperative Respiratory: No increased work of breathing.  Right lower extremity: Dressing removed, incisions clean, dry, intact.  No significant tenderness with palpation over knee or throughout thigh.  Ankle DF/PF intact. Endorses sensation to light touch. Foot warm and well perfused.  2+ DP pulse  IMAGING: Stable post op imaging.   LABS:  Results for orders placed or performed during the hospital encounter of 09/22/20 (from the past 24 hour(s))  Basic metabolic panel     Status: Abnormal   Collection Time: 09/25/20 12:37 AM  Result Value Ref Range   Sodium 135 135 - 145 mmol/L   Potassium 4.4 3.5 - 5.1 mmol/L   Chloride 103 98 - 111 mmol/L   CO2 28 22 - 32 mmol/L   Glucose, Bld 133 (H) 70 - 99 mg/dL   BUN 10 8 - 23 mg/dL   Creatinine, Ser 0.93 0.44 - 1.00 mg/dL   Calcium 8.1 (L) 8.9 - 10.3 mg/dL   GFR, Estimated >60 >60 mL/min   Anion gap 4 (L) 5 - 15  CBC     Status: Abnormal   Collection Time: 09/25/20 12:37 AM  Result Value Ref Range   WBC 6.7 4.0 - 10.5 K/uL   RBC 3.48 (L) 3.87 - 5.11 MIL/uL   Hemoglobin 10.2 (L) 12.0 - 15.0 g/dL   HCT 31.7 (L) 36.0 - 46.0 %   MCV 91.1 80.0 - 100.0 fL   MCH 29.3 26.0 - 34.0 pg   MCHC 32.2 30.0 - 36.0 g/dL   RDW 12.9 11.5 - 15.5 %   Platelets 153 150 - 400 K/uL   nRBC 0.0 0.0 - 0.2 %     ASSESSMENT: Julia [name] is a 86 y.o. female, 2 Days Post-Op s/p ORIF RIGHT PERIPROSTHETIC DISTAL FEMUR FRACTURE  CV/Blood loss: Acute blood loss anemia, Hgb 10.2 this AM. Hemodynamically stable  PLAN: Weightbearing: WBAT RLE.  Okay for unrestricted range of motion of the knee Incisional and dressing care: Okay to leave incisions open to air Showering: Okay to begin showering with assistance.  Incisions may get wet Orthopedic device(s): None  Pain management:  1. Tylenol 650 mg q 6 hours PRN 2. Tramadol 50 mg q 6 hours PRN 3. Dilaudid 0.5 mg q 3 hours PRN VTE prophylaxis: Lovenox, SCDs ID: Ancef 2gm post op Foley/Lines: Foley catheter in place, remove today if able.  KVO IVFs Impediments to Fracture Healing: Vitamin D level 14, continue D3 supplementation Dispo: Therapies as tolerated, PT/OT recommending SNF.  Patient and her daughter in agreement to this.  Okay for discharge from ortho standpoint once cleared by medicine team and therapies. I have signed and placed discharge Rx for pain medication, vitamin D, DVT prophylaxis in patient's chart Follow - up plan: 2 weeks after discharge for repeat x-rays and wound check  Contact information:  Katha Hamming MD, Patrecia Pace PA-C. After hours and holidays please check [email or web address] for group call information for [facility]   [name], PA-C [phone] (office) [email or web address]

## 2020-09-25 NOTE — Progress Notes (Signed)
Physical Therapy Treatment Patient Details Name: Julia [name] MRN: [id number] DOB: 01-20-1935 Today's Date: 09/25/2020    History of Present Illness 86 yo female s/p ORIF R distal femur fracture due to fall at home sustaining R periprosthetic femur fx. PMH includes CKD 3, hypothyroidism, HTN, Alzheimer's dementia, fall with R hip fracture and repair 2018, osteopenia.    PT Comments    Pt progressing slowly with mobility, demonstrating improved weightbearing tolerance on RLE in static standing. Pt took 1 step with RLE today, but unable to progress to gait due to pain, weakness, and anxiety. Pt requires very increased time for all mobility due to anxious behaviors, stating "wait a minute" between every transition and resistant to mobility at times. Pt tolerated RLE exercises well, PT to continue to follow acutely. SNF remains appropriate dispo at this time.   Of note, pt noted to have small amount of bleeding from incision site when sitting EOB. RN notified, and she applied steristrips x2 to address.    Follow Up Recommendations  SNF     Equipment Recommendations  None recommended by PT    Recommendations for Other Services       Precautions / Restrictions Precautions Precautions: Fall Restrictions Weight Bearing Restrictions: No RLE Weight Bearing: Weight bearing as tolerated Other Position/Activity Restrictions: unlimited ROM RLE    Mobility  Bed Mobility Overal bed mobility: Needs Assistance Bed Mobility: Supine to Sit     Supine to sit: Max assist;HOB elevated     General bed mobility comments: max assist for step-wise transition of LEs to EOB, trunk elevation off of bed, and scooting to EOB with use of bed pads and weight shifting L/R.    Transfers Overall transfer level: Needs assistance Equipment used: Rolling walker (2 wheeled) Transfers: Sit to/from [facility] Sit to Stand: Max assist;+2 safety/equipment Stand pivot transfers: Max  assist;+2 safety/equipment       General transfer comment: max assist for power up, rise, and steady; STS x2, with sustained standing x1 minute at a time. Pre-gait tasks included toe taps, RLE marching, attempted LLE marching but pt unsuccessful due to anxiety and pain.Tactile input for upright posture at sternum and sacrum. Transfer to L with pivot on LLE, pt with TDWB on RLE only.  Ambulation/Gait             General Gait Details: NT   Stairs             Wheelchair Mobility    Modified Rankin (Stroke Patients Only)       Balance Overall balance assessment: Needs assistance;History of Falls Sitting-balance support: No upper extremity supported;Feet supported Sitting balance-Leahy Scale: Fair Sitting balance - Comments: able to sit EOB without PT/OT support after gaining initial balance   Standing balance support: Bilateral upper extremity supported;During functional activity Standing balance-Leahy Scale: Zero Standing balance comment: Standing tolerance x1 minute before fatiguing                            Cognition Arousal/Alertness: Awake/alert Behavior During Therapy: Restless;Anxious Overall Cognitive Status: History of cognitive impairments - at baseline                                 General Comments: history of dementia; pt oriented to self only, very limited by anxiety during mobility.      Exercises General Exercises - Lower Extremity Ankle Circles/Pumps:  AROM;Both;10 reps;Seated Long Arc Quad: AAROM;Right;Seated;10 reps Hip ABduction/ADduction: AAROM;Right;10 reps;Seated    General Comments        Pertinent Vitals/Pain Pain Assessment: Faces Faces Pain Scale: Hurts even more Pain Location: R hip, knee Pain Descriptors / Indicators: Discomfort;Grimacing;Guarding;Moaning Pain Intervention(s): Limited activity within patient's tolerance;Monitored during session;Repositioned;Premedicated before session    Home  Living                      Prior Function            PT Goals (current goals can now be found in the care plan section) Acute Rehab PT Goals Patient Stated Goal: feel better PT Goal Formulation: With patient Time For Goal Achievement: 10/08/20 Potential to Achieve Goals: Good Progress towards PT goals: Progressing toward goals    Frequency    Min 3X/week      PT Plan Current plan remains appropriate    Co-evaluation              AM-PAC PT "6 Clicks" Mobility   Outcome Measure  Help needed turning from your back to your side while in a flat bed without using bedrails?: A Lot Help needed moving from lying on your back to sitting on the side of a flat bed without using bedrails?: A Lot Help needed moving to and from a bed to a chair (including a wheelchair)?: A Lot Help needed standing up from a chair using your arms (e.g., wheelchair or bedside chair)?: A Lot Help needed to walk in hospital room?: Total Help needed climbing 3-5 steps with a railing? : Total 6 Click Score: 10    End of Session Equipment Utilized During Treatment: Gait belt Activity Tolerance: Patient limited by fatigue;Patient limited by pain Patient left: in chair;with call bell/phone within reach;with chair alarm set;with family/visitor present Nurse Communication: Mobility status PT Visit Diagnosis: Other abnormalities of gait and mobility (R26.89);Difficulty in walking, not elsewhere classified (R26.2);Pain Pain - Right/Left: Right Pain - part of body: Hip;Knee;Leg     Time: 2671-2458 PT Time Calculation (min) (ACUTE ONLY): 42 min  Charges:  $Therapeutic Exercise: 8-22 mins $Therapeutic Activity: 23-37 mins                    Stacie Glaze, PT DPT Acute Rehabilitation Services Pager [phone]  Office [phone]   Roxine Caddy E Ruffin Pyo 09/25/2020, 4:44 PM

## 2020-09-25 NOTE — Progress Notes (Signed)
PROGRESS NOTE    Julia [name]  WNU:[id number] DOB: 10-28-1934 DOA: 09/22/2020 PCP: Dorothyann Peng, NP    Brief Narrative:  Julia [name] is a 86 y.o. female with medical history significant for CKD 3, hypothyroidism, HTN, Alzheimer's dementia who presents by EMS after a fall at home.  Patient is not a good historian and cannot give details of what happened.  Daughter is at bedside who provides the history.  Daughter is POA.  Patient was at home with her husband and had walked down the gravel driveway toward her mailbox when she fell.  Family has cameras up around the house but she was in an area that was a blind spot to the camera so the fall was not seen.  She laid the ground for 15 to 20 minutes when the mailman came by the house and saw her and called for help.  Patient complained of pain in her right leg and was not able to stand up and bear weight.  She denies any numbness or weakness of her right leg or foot.  Pain is exacerbated by movement of her leg.  She has a history of right hip fracture in 2018 with repair.  Daughter reports that she initially broke her right hip in June 2018 and fell again in September 2018 fracturing around the hip prosthesis and was reoperated on.  She generally has some chronic hip pain and takes Tylenol for this twice a day.  She normally ambulates and does not use a walker or cane at home. Patient denies tobacco, alcohol use  ED Course: Ms. Julia [name] is found to have a displaced periprosthetic fracture in the mid to distal femur which extends into the intra-articular surface of the knee joint.  Hemodynamically stable.  Labs are unremarkable.  Hospitalist service asked to admit for further management  4/29-daughter at bedside.  Patient denies pain, shortness of breath, chest pain.  No BM  Consultants:   Orthopedics  Procedures:   Antimicrobials:       Subjective: No complaints as above Objective: Vitals:   09/24/20 0500 09/24/20 2236  09/25/20 0617 09/25/20 0805  BP: (!) 141/61 (!) 147/74 (!) 156/68 (!) 147/62  Pulse: 83 75 93 87  Resp: 19 18 16 17   Temp: 97.9 F (36.6 C) 99.2 F (37.3 C) 98.5 F (36.9 C) 98 F (36.7 C)  TempSrc: Oral Oral Oral Oral  SpO2: 98% 95% 92% 91%  Weight:      Height:        Intake/Output Summary (Last 24 hours) at 09/25/2020 1039 Last data filed at 09/25/2020 0845 Gross per 24 hour  Intake 899.93 ml  Output 1700 ml  Net -800.07 ml   Filed Weights   09/22/20 1034  Weight: 54.4 kg    Examination: Calm, comfortable NAD CTA no wheeze rales rhonchi's Regular S1-S2 no gallops Soft benign positive bowel sounds No edema Awake and alert grossly intact Mood and affect appropriate in current setting   Data Reviewed: I have personally reviewed following labs and imaging studies  CBC: Recent Labs  Lab 09/22/20 1057 09/23/20 0313 09/24/20 0646 09/25/20 0037  WBC 9.5 7.0 7.8 6.7  NEUTROABS 7.9* 5.2  --   --   HGB 13.9 11.9* 10.9* 10.2*  HCT 44.0 37.2 35.0* 31.7*  MCV 90.9 89.6 92.8 91.1  PLT 214 202 160 034   Basic Metabolic Panel: Recent Labs  Lab 09/22/20 1057 09/23/20 0313 09/24/20 0646 09/25/20 0037  NA 138 138 138 135  K  4.1 3.9 4.3 4.4  CL 105 103 104 103  CO2 25 25 26 28   GLUCOSE 117* 123* 106* 133*  BUN 21 18 14 10   CREATININE 1.08* 1.04* 1.07* 0.93  CALCIUM 9.1 8.7* 8.1* 8.1*   GFR: Estimated Creatinine Clearance: 39.4 mL/min (by C-G formula based on SCr of 0.93 mg/dL). Liver Function Tests: Recent Labs  Lab 09/23/20 0313  AST 22  ALT 13  ALKPHOS 54  BILITOT 1.2  PROT 5.5*  ALBUMIN 3.2*   No results for input(s): LIPASE, AMYLASE in the last 168 hours. No results for input(s): AMMONIA in the last 168 hours. Coagulation Profile: Recent Labs  Lab 09/22/20 1057 09/23/20 0313  INR 1.0 1.1   Cardiac Enzymes: No results for input(s): CKTOTAL, CKMB, CKMBINDEX, TROPONINI in the last 168 hours. BNP (last 3 results) No results for input(s):  PROBNP in the last 8760 hours. HbA1C: No results for input(s): HGBA1C in the last 72 hours. CBG: No results for input(s): GLUCAP in the last 168 hours. Lipid Profile: No results for input(s): CHOL, HDL, LDLCALC, TRIG, CHOLHDL, LDLDIRECT in the last 72 hours. Thyroid Function Tests: No results for input(s): TSH, T4TOTAL, FREET4, T3FREE, THYROIDAB in the last 72 hours. Anemia Panel: No results for input(s): VITAMINB12, FOLATE, FERRITIN, TIBC, IRON, RETICCTPCT in the last 72 hours. Sepsis Labs: No results for input(s): PROCALCITON, LATICACIDVEN in the last 168 hours.  Recent Results (from the past 240 hour(s))  SARS CORONAVIRUS 2 (TAT 6-24 HRS) Nasopharyngeal Nasopharyngeal Swab     Status: None   Collection Time: 09/22/20  5:41 PM   Specimen: Nasopharyngeal Swab  Result Value Ref Range Status   SARS Coronavirus 2 NEGATIVE NEGATIVE Final    Comment: (NOTE) SARS-CoV-2 target nucleic acids are NOT DETECTED.  The SARS-CoV-2 RNA is generally detectable in upper and lower respiratory specimens during the acute phase of infection. Negative results do not preclude SARS-CoV-2 infection, do not rule out co-infections with other pathogens, and should not be used as the sole basis for treatment or other patient management decisions. Negative results must be combined with clinical observations, patient history, and epidemiological information. The expected result is Negative.  Fact Sheet for Patients: SugarRoll.be  Fact Sheet for Healthcare Providers: https://www.woods-mathews.com/  This test is not yet approved or cleared by the [place] FDA and  has been authorized for detection and/or diagnosis of SARS-CoV-2 by FDA under an Emergency Use Authorization (EUA). This EUA will remain  in effect (meaning this test can be used) for the duration of the COVID-19 declaration under Se ction 564(b)(1) of the Act, 21 U.S.C. section 360bbb-3(b)(1), unless  the authorization is terminated or revoked sooner.  Performed at [facility], [place] [address], [place], [place] [address]   MRSA PCR Screening     Status: None   Collection Time: 09/23/20  5:59 AM   Specimen: Nasal Mucosa; Nasopharyngeal  Result Value Ref Range Status   MRSA by PCR NEGATIVE NEGATIVE Final    Comment:        The GeneXpert MRSA Assay (FDA approved for NASAL specimens only), is one component of a comprehensive MRSA colonization surveillance program. It is not intended to diagnose MRSA infection nor to guide or monitor treatment for MRSA infections. Performed at [facility], [place] [address], [place], [place] [address]          Radiology Studies: DG C-Arm 1-60 Min  Result Date: 09/23/2020 CLINICAL DATA:  ORIF right femur. EXAM: DG C-ARM 1-60 MIN;  RIGHT FEMUR 2 VIEWS FLUOROSCOPY TIME:  Fluoroscopy Time:  59 seconds. Radiation Exposure Index (if provided by the fluoroscopic device): 4.56 mGy. Number of Acquired Spot Images: 7 COMPARISON:  September 22, 2020 FINDINGS: Seven C-arm fluoroscopic images were obtained intraoperatively and submitted for post operative interpretation. These images demonstrate plate and screw fixation of the femur. Final images demonstrate improved alignment the periprosthetic distal femur fracture. Partially imaged prior intramedullary nail fixation hardware. Please see the performing provider's procedural report for further detail. IMPRESSION: Intraoperative fluoroscopy, as detailed above. Electronically Signed   By: Margaretha Sheffield MD   On: 09/23/2020 14:17   DG FEMUR, MIN 2 VIEWS RIGHT  Result Date: 09/23/2020 CLINICAL DATA:  ORIF right femur. EXAM: DG C-ARM 1-60 MIN; RIGHT FEMUR 2 VIEWS FLUOROSCOPY TIME:  Fluoroscopy Time:  59 seconds. Radiation Exposure Index (if provided by the fluoroscopic device): 4.56 mGy. Number of Acquired Spot Images: 7 COMPARISON:  September 22, 2020 FINDINGS: Seven C-arm fluoroscopic images were obtained  intraoperatively and submitted for post operative interpretation. These images demonstrate plate and screw fixation of the femur. Final images demonstrate improved alignment the periprosthetic distal femur fracture. Partially imaged prior intramedullary nail fixation hardware. Please see the performing provider's procedural report for further detail. IMPRESSION: Intraoperative fluoroscopy, as detailed above. Electronically Signed   By: Margaretha Sheffield MD   On: 09/23/2020 14:17   DG FEMUR PORT, MIN 2 VIEWS RIGHT  Result Date: 09/23/2020 CLINICAL DATA:  Postop femur fracture fixation. EXAM: RIGHT FEMUR PORTABLE 2 VIEW COMPARISON:  Preoperative radiograph yesterday. FINDINGS: Interval lateral plate and multi screw fixation of periprosthetic distal femur fracture. The fracture is in improved alignment compared to preoperative imaging. Intramedullary rod with trans trochanteric screw fixation again seen recent postsurgical change includes air and edema in the soft tissues. IMPRESSION: ORIF of periprosthetic distal femur fracture, in improved alignment compared to preoperative imaging. Electronically Signed   By: Keith Rake M.D.   On: 09/23/2020 15:49        Scheduled Meds: . amLODipine  5 mg Oral Daily  . Chlorhexidine Gluconate Cloth  6 each Topical Daily  . cholecalciferol  2,000 Units Oral Daily  . docusate sodium  100 mg Oral BID  . enoxaparin (LOVENOX) injection  40 mg Subcutaneous Q24H  . galantamine  24 mg Oral q AM  . lactose free nutrition  237 mL Oral Q24H  . levothyroxine  88 mcg Oral QAC breakfast  . memantine  28 mg Oral QHS  . mirtazapine  15 mg Oral QHS  . multivitamin with minerals  1 tablet Oral Daily  . QUEtiapine  25 mg Oral QHS   Continuous Infusions: . 0.9 % NaCl with KCl 20 mEq / L 100 mL/hr at 09/25/20 0207    Assessment & Plan:   Active Problems:   Essential hypertension   Alzheimer's dementia ([place])   Closed fracture of shaft of right femur (HCC)   CKD  (chronic kidney disease) stage 3, GFR 30-59 ml/min (HCC)  Closed fracture of shaft of right femur  Status post ORIF on 4/27  4/29 PT recommends SNF, family interested in CIR  Pain management  Ortho following  Pain control  Bowel regimen, will make adjustments since she has had no bowel movement Follow-up with Ortho in 2 weeks after discharge for repeat x-ray and wound check Okay to begin shower with assistant.  Incision may get wet WBAT RLE, okay for unrestricted range of motion of the knee incision and dressing.Faythe Ghee to leave  incision open to air We will try to DC Foley in a.m. if had bowel movement and ambulating more  Essential hypertension Stable continue amlodipine   CKD (chronic kidney disease) stage 3 A Stable Continue to monitor  Post op blood loss-  Hemoglobin stable Continue to monitor     Alzheimer's dementia  Continue Namenda and [place]    DVT prophylaxis: SCD, lovenox Code Status: Full Family Communication: Daughter at bedside  Status is: Inpatient  Remains inpatient appropriate because:Inpatient level of care appropriate due to severity of illness   Dispo: The patient is from: Home              Anticipated d/c is to: CIR              Patient currently is not medically stable to d/c.   Difficult to place patient No            LOS: 3 days   Time spent: 35 minutes with more than 50% COC    Nolberto Hanlon, MD [facility] Pager [phone]  If 7PM-7AM, please contact night-coverage 09/25/2020, 10:39 AM

## 2020-09-26 DIAGNOSIS — N1832 Chronic kidney disease, stage 3b: Secondary | ICD-10-CM | POA: Diagnosis not present

## 2020-09-26 DIAGNOSIS — I10 Essential (primary) hypertension: Secondary | ICD-10-CM | POA: Diagnosis not present

## 2020-09-26 DIAGNOSIS — S72391S Other fracture of shaft of right femur, sequela: Secondary | ICD-10-CM | POA: Diagnosis not present

## 2020-09-26 DIAGNOSIS — G309 Alzheimer's disease, unspecified: Secondary | ICD-10-CM | POA: Diagnosis not present

## 2020-09-26 LAB — BASIC METABOLIC PANEL
Anion gap: 7 (ref 5–15)
BUN: 13 mg/dL (ref 8–23)
CO2: 26 mmol/L (ref 22–32)
Calcium: 8.2 mg/dL — ABNORMAL LOW (ref 8.9–10.3)
Chloride: 103 mmol/L (ref 98–111)
Creatinine, Ser: 0.9 mg/dL (ref 0.44–1.00)
GFR, Estimated: >60 mL/min (ref >60)
Glucose, Bld: 107 mg/dL — ABNORMAL HIGH (ref 70–99)
Potassium: 4.2 mmol/L (ref 3.5–5.1)
Sodium: 136 mmol/L (ref 135–145)

## 2020-09-26 NOTE — Plan of Care (Signed)

## 2020-09-26 NOTE — Plan of Care (Signed)
  Problem: Activity: Goal: Risk for activity intolerance will decrease Outcome: Progressing   Problem: Coping: Goal: Level of anxiety will decrease Outcome: Progressing   Problem: Pain Managment: Goal: General experience of comfort will improve Outcome: Progressing   Problem: Safety: Goal: Ability to remain free from injury will improve Outcome: Progressing   Problem: Skin Integrity: Goal: Risk for impaired skin integrity will decrease Outcome: Progressing   

## 2020-09-26 NOTE — Progress Notes (Signed)
Physical Therapy Treatment Patient Details Name: Julia [name] MRN: [id number] DOB: 1934-09-26 Today's Date: 09/26/2020    History of Present Illness 86 yo female s/p ORIF R distal femur fracture due to fall at home sustaining R periprosthetic femur fx. PMH includes CKD 3, hypothyroidism, HTN, Alzheimer's dementia, fall with R hip fracture and repair 2018, osteopenia.    PT Comments    Pt OOB in recliner on arrival. Granddaughter present reported RN transferred pt via face to face stand pivot with total A. Pt able to perform LE HEP with assist. Noted weakness on the R quad limiting mobility. Pt able to stand with mod A +2. She is very fearful and anxious of falling, requiring max encouragement. Family present assisting in keeping pt calm. May be able to attempt gait next session. Will continue to follow acutely.    Follow Up Recommendations  SNF     Equipment Recommendations  None recommended by PT    Recommendations for Other Services       Precautions / Restrictions Precautions Precautions: Fall Restrictions Weight Bearing Restrictions: Yes RLE Weight Bearing: Weight bearing as tolerated Other Position/Activity Restrictions: unlimited ROM RLE    Mobility  Bed Mobility               General bed mobility comments: up in chair on arrival    Transfers Overall transfer level: Needs assistance Equipment used: Rolling walker (2 wheeled) Transfers: Sit to/from Stand;Stand Pivot Transfers Sit to Stand: +2 safety/equipment;Mod assist Stand pivot transfers: +2 safety/equipment;Mod assist       General transfer comment: Pt very anxious with standing and fearful of falling. performed STS from recliner 2x with cues for hand placement and postural control. Tactile input for upright posture at sternum and sacrum. Able to stand for ~1 min before sitting. Attempted marching in place, however unable to fully pick up either LE.  Ambulation/Gait             General Gait  Details: NT   Stairs             Wheelchair Mobility    Modified Rankin (Stroke Patients Only)       Balance Overall balance assessment: Needs assistance;History of Falls Sitting-balance support: No upper extremity supported;Feet supported Sitting balance-Leahy Scale: Fair     Standing balance support: Bilateral upper extremity supported;During functional activity Standing balance-Leahy Scale: Poor Standing balance comment: Standing tolerance x1 minute before fatiguing                            Cognition Arousal/Alertness: Awake/alert Behavior During Therapy: WFL for tasks assessed/performed;Anxious Overall Cognitive Status: History of cognitive impairments - at baseline                                 General Comments: history of dementia; pt oriented to self only, very limited by anxiety during mobility.      Exercises General Exercises - Lower Extremity Ankle Circles/Pumps: AROM;Both;10 reps;Seated Quad Sets: AAROM;Right;10 reps;Seated Short Arc Quad: AAROM;Right;10 reps;Seated Heel Slides: AAROM;Right;10 reps;Seated Hip ABduction/ADduction: AAROM;Right;10 reps;Seated    General Comments General comments (skin integrity, edema, etc.): Grandaughter present and helpful for session.      Pertinent Vitals/Pain Pain Assessment: Faces Faces Pain Scale: Hurts even more Pain Location: R hip, knee Pain Descriptors / Indicators: Discomfort;Grimacing;Guarding;Moaning Pain Intervention(s): Monitored during session;Limited activity within patient's tolerance    Home  Living                      Prior Function            PT Goals (current goals can now be found in the care plan section) Acute Rehab PT Goals Patient Stated Goal: feel better PT Goal Formulation: With patient Time For Goal Achievement: 10/08/20 Potential to Achieve Goals: Good Progress towards PT goals: Progressing toward goals    Frequency    Min  3X/week      PT Plan Current plan remains appropriate    Co-evaluation              AM-PAC PT "6 Clicks" Mobility   Outcome Measure  Help needed turning from your back to your side while in a flat bed without using bedrails?: A Lot Help needed moving from lying on your back to sitting on the side of a flat bed without using bedrails?: A Lot Help needed moving to and from a bed to a chair (including a wheelchair)?: A Lot Help needed standing up from a chair using your arms (e.g., wheelchair or bedside chair)?: A Lot Help needed to walk in hospital room?: Total Help needed climbing 3-5 steps with a railing? : Total 6 Click Score: 10    End of Session Equipment Utilized During Treatment: Gait belt Activity Tolerance: Patient limited by pain;Other (comment) (limited by fear) Patient left: in chair;with call bell/phone within reach;with chair alarm set;with family/visitor present Nurse Communication: Mobility status PT Visit Diagnosis: Other abnormalities of gait and mobility (R26.89);Difficulty in walking, not elsewhere classified (R26.2);Pain Pain - Right/Left: Right Pain - part of body: Hip;Knee;Leg     Time: 0175-1025 PT Time Calculation (min) (ACUTE ONLY): 26 min  Charges:  $Therapeutic Exercise: 8-22 mins $Therapeutic Activity: 8-22 mins                    Benjiman Core, [place] Pager [phone] Acute Rehab  Allena Katz 09/26/2020, 11:08 AM

## 2020-09-26 NOTE — Progress Notes (Signed)
PROGRESS NOTE    Julia [name]  DXA:[id number] DOB: [birth year] DOA: 09/22/2020 PCP: Dorothyann Peng, NP    Brief Narrative:  Julia [name] is a 86 y.o. female with medical history significant for CKD 3, hypothyroidism, HTN, Alzheimer's dementia who presents by EMS after a fall at home.  Patient is not a good historian and cannot give details of what happened.  Daughter is at bedside who provides the history.  Daughter is POA.  Patient was at home with her husband and had walked down the gravel driveway toward her mailbox when she fell.  Family has cameras up around the house but she was in an area that was a blind spot to the camera so the fall was not seen.  She laid the ground for 15 to 20 minutes when the mailman came by the house and saw her and called for help.  Patient complained of pain in her right leg and was not able to stand up and bear weight.  She denies any numbness or weakness of her right leg or foot.  Pain is exacerbated by movement of her leg.  She has a history of right hip fracture in 2018 with repair.  Daughter reports that she initially broke her right hip in June 2018 and fell again in September 2018 fracturing around the hip prosthesis and was reoperated on.  She generally has some chronic hip pain and takes Tylenol for this twice a day.  She normally ambulates and does not use a walker or cane at home. Patient denies tobacco, alcohol use  ED Course: Julia [name] is found to have a displaced periprosthetic fracture in the mid to distal femur which extends into the intra-articular surface of the knee joint.  Hemodynamically stable.  Labs are unremarkable.  Hospitalist service asked to admit for further management  4/30- had bm. I wanted to discontinue foley but pt refused as she is scarred going to bathroom and falling. We are in agreement she will try more with PT and voiding trial tomorrow as I told her and GD that if we leave foley too long she is at risk of  infection.    Consultants:   Orthopedics  Procedures:   Antimicrobials:       Subjective: No complaints.  No chest pain, shortness breath abdominal pain.  Objective: Vitals:   09/25/20 1932 09/26/20 0419 09/26/20 0430 09/26/20 0727  BP: (!) 150/65 135/71  (!) 157/71  Pulse: 94 72  92  Resp: 16 15  18   Temp: 98 F (36.7 C) (!) 97.3 F (36.3 C)  98.9 F (37.2 C)  TempSrc: Oral Oral  Oral  SpO2: 94% 92% 92% 91%  Weight:      Height:        Intake/Output Summary (Last 24 hours) at 09/26/2020 1432 Last data filed at 09/26/2020 1234 Gross per 24 hour  Intake 440 ml  Output 2050 ml  Net -1610 ml   Filed Weights   09/22/20 1034  Weight: 54.4 kg    Examination: Sitting in chair, calm CTA, no W/r/r Regular S1-S2 no gallops Soft benign positive bowel sounds No edema Awake and alert  Data Reviewed: I have personally reviewed following labs and imaging studies  CBC: Recent Labs  Lab 09/22/20 1057 09/23/20 0313 09/24/20 0646 09/25/20 0037  WBC 9.5 7.0 7.8 6.7  NEUTROABS 7.9* 5.2  --   --   HGB 13.9 11.9* 10.9* 10.2*  HCT 44.0 37.2 35.0* 31.7*  MCV 90.9 89.6 92.8  91.1  PLT 214 202 160 284   Basic Metabolic Panel: Recent Labs  Lab 09/22/20 1057 09/23/20 0313 09/24/20 0646 09/25/20 0037 09/26/20 0212  NA 138 138 138 135 136  K 4.1 3.9 4.3 4.4 4.2  CL 105 103 104 103 103  CO2 25 25 26 28 26   GLUCOSE 117* 123* 106* 133* 107*  BUN 21 18 14 10 13   CREATININE 1.08* 1.04* 1.07* 0.93 0.90  CALCIUM 9.1 8.7* 8.1* 8.1* 8.2*   GFR: Estimated Creatinine Clearance: 40.7 mL/min (by C-G formula based on SCr of 0.9 mg/dL). Liver Function Tests: Recent Labs  Lab 09/23/20 0313  AST 22  ALT 13  ALKPHOS 54  BILITOT 1.2  PROT 5.5*  ALBUMIN 3.2*   No results for input(s): LIPASE, AMYLASE in the last 168 hours. No results for input(s): AMMONIA in the last 168 hours. Coagulation Profile: Recent Labs  Lab 09/22/20 1057 09/23/20 0313  INR 1.0 1.1    Cardiac Enzymes: No results for input(s): CKTOTAL, CKMB, CKMBINDEX, TROPONINI in the last 168 hours. BNP (last 3 results) No results for input(s): PROBNP in the last 8760 hours. HbA1C: No results for input(s): HGBA1C in the last 72 hours. CBG: No results for input(s): GLUCAP in the last 168 hours. Lipid Profile: No results for input(s): CHOL, HDL, LDLCALC, TRIG, CHOLHDL, LDLDIRECT in the last 72 hours. Thyroid Function Tests: No results for input(s): TSH, T4TOTAL, FREET4, T3FREE, THYROIDAB in the last 72 hours. Anemia Panel: No results for input(s): VITAMINB12, FOLATE, FERRITIN, TIBC, IRON, RETICCTPCT in the last 72 hours. Sepsis Labs: No results for input(s): PROCALCITON, LATICACIDVEN in the last 168 hours.  Recent Results (from the past 240 hour(s))  SARS CORONAVIRUS 2 (TAT 6-24 HRS) Nasopharyngeal Nasopharyngeal Swab     Status: None   Collection Time: 09/22/20  5:41 PM   Specimen: Nasopharyngeal Swab  Result Value Ref Range Status   SARS Coronavirus 2 NEGATIVE NEGATIVE Final    Comment: (NOTE) SARS-CoV-2 target nucleic acids are NOT DETECTED.  The SARS-CoV-2 RNA is generally detectable in upper and lower respiratory specimens during the acute phase of infection. Negative results do not preclude SARS-CoV-2 infection, do not rule out co-infections with other pathogens, and should not be used as the sole basis for treatment or other patient management decisions. Negative results must be combined with clinical observations, patient history, and epidemiological information. The expected result is Negative.  Fact Sheet for Patients: SugarRoll.be  Fact Sheet for Healthcare Providers: https://www.woods-mathews.com/  This test is not yet approved or cleared by the [place] FDA and  has been authorized for detection and/or diagnosis of SARS-CoV-2 by FDA under an Emergency Use Authorization (EUA). This EUA will remain  in effect  (meaning this test can be used) for the duration of the COVID-19 declaration under Se ction 564(b)(1) of the Act, 21 U.S.C. section 360bbb-3(b)(1), unless the authorization is terminated or revoked sooner.  Performed at [facility], [place] [address], [place], [place] [address]   MRSA PCR Screening     Status: None   Collection Time: 09/23/20  5:59 AM   Specimen: Nasal Mucosa; Nasopharyngeal  Result Value Ref Range Status   MRSA by PCR NEGATIVE NEGATIVE Final    Comment:        The GeneXpert MRSA Assay (FDA approved for NASAL specimens only), is one component of a comprehensive MRSA colonization surveillance program. It is not intended to diagnose MRSA infection nor to guide or monitor treatment for MRSA infections. Performed  at [facility], [place] [address], [place], [place] [address]          Radiology Studies: No results found.      Scheduled Meds: . amLODipine  5 mg Oral Daily  . Chlorhexidine Gluconate Cloth  6 each Topical Daily  . cholecalciferol  2,000 Units Oral Daily  . docusate sodium  100 mg Oral BID  . enoxaparin (LOVENOX) injection  40 mg Subcutaneous Q24H  . galantamine  24 mg Oral q AM  . lactose free nutrition  237 mL Oral Q24H  . levothyroxine  88 mcg Oral QAC breakfast  . memantine  28 mg Oral QHS  . mirtazapine  15 mg Oral QHS  . multivitamin with minerals  1 tablet Oral Daily  . polyethylene glycol  17 g Oral BID  . QUEtiapine  25 mg Oral QHS  . senna-docusate  1 tablet Oral BID   Continuous Infusions:   Assessment & Plan:   Active Problems:   Essential hypertension   Alzheimer's dementia ([place])   Closed fracture of shaft of right femur (HCC)   CKD (chronic kidney disease) stage 3, GFR 30-59 ml/min (HCC)  Closed fracture of shaft of right femur  Status post ORIF on 4/27   PT recommends SNF, family interested in [place] following  Follow-up with Ortho in 2 weeks after discharge for repeat x-ray and wound check Okay  to begin shower with assistant.  Incision may get wet WBAT RLE, okay for unrestricted range of motion of the knee incision and dressing.Faythe Ghee to leave incision open to air 4/30-try voiding trial tomorrow as patient refused to take the Foley out and use the bathroom Continue PT OT Bowel regimen Pain management    Essential hypertension Overall stable, continue amlodipine   CKD (chronic kidney disease) stage 3 A Stable Continue to monitor.   Post op blood loss-  Hemoglobin stable.  No new labs.  We will check a.m. labs    Alzheimer's dementia  Continue Namenda and Razadyne    DVT prophylaxis: SCD, lovenox Code Status: Full Family Communication: grandaughter at bedside  Status is: Inpatient  Remains inpatient appropriate because:Inpatient level of care appropriate due to severity of illness   Dispo: The patient is from: Home              Anticipated d/c is to: CIR              Patient currently is not medically stable to d/c.   Difficult to place patient No            LOS: 4 days   Time spent: 35 minutes with more than 50% COC    Nolberto Hanlon, MD [facility] Pager [phone]  If 7PM-7AM, please contact night-coverage 09/26/2020, 2:32 PM

## 2020-09-27 DIAGNOSIS — I10 Essential (primary) hypertension: Secondary | ICD-10-CM | POA: Diagnosis not present

## 2020-09-27 DIAGNOSIS — S72391S Other fracture of shaft of right femur, sequela: Secondary | ICD-10-CM | POA: Diagnosis not present

## 2020-09-27 DIAGNOSIS — E440 Moderate protein-calorie malnutrition: Secondary | ICD-10-CM | POA: Insufficient documentation

## 2020-09-27 DIAGNOSIS — N1832 Chronic kidney disease, stage 3b: Secondary | ICD-10-CM | POA: Diagnosis not present

## 2020-09-27 DIAGNOSIS — G309 Alzheimer's disease, unspecified: Secondary | ICD-10-CM | POA: Diagnosis not present

## 2020-09-27 LAB — CBC
HCT: 32.8 % — ABNORMAL LOW (ref 36.0–46.0)
Hemoglobin: 10.5 g/dL — ABNORMAL LOW (ref 12.0–15.0)
MCH: 28.5 pg (ref 26.0–34.0)
MCHC: 32 g/dL (ref 30.0–36.0)
MCV: 89.1 fL (ref 80.0–100.0)
Platelets: 226 10*3/uL (ref 150–400)
RBC: 3.68 MIL/uL — ABNORMAL LOW (ref 3.87–5.11)
RDW: 12.9 % (ref 11.5–15.5)
WBC: 6.2 10*3/uL (ref 4.0–10.5)
nRBC: 0 % (ref 0.0–0.2)

## 2020-09-27 MED ORDER — AMLODIPINE BESYLATE 5 MG PO TABS
5.0000 mg | ORAL_TABLET | Freq: Once | ORAL | Status: AC
  Administered 2020-09-27: 5 mg via ORAL
  Filled 2020-09-27: qty 1

## 2020-09-27 MED ORDER — AMLODIPINE BESYLATE 10 MG PO TABS
10.0000 mg | ORAL_TABLET | Freq: Every day | ORAL | Status: DC
  Administered 2020-09-28: 10 mg via ORAL
  Filled 2020-09-27: qty 1

## 2020-09-27 NOTE — Plan of Care (Signed)
  Problem: Health Behavior/Discharge Planning: Goal: Ability to manage health-related needs will improve Outcome: Progressing   Problem: Activity: Goal: Risk for activity intolerance will decrease Outcome: Progressing   

## 2020-09-27 NOTE — Progress Notes (Addendum)
PROGRESS NOTE    Julia [name]  OVZ:[id number] DOB: [birth year] DOA: 09/22/2020 PCP: Dorothyann Peng, NP    Brief Narrative:  Julia [name] is a 86 y.o. female with medical history significant for CKD 3, hypothyroidism, HTN, Alzheimer's dementia who presents by EMS after a fall at home.  Patient is not a good historian and cannot give details of what happened.  Daughter is at bedside who provides the history.  Daughter is POA.  Patient was at home with her husband and had walked down the gravel driveway toward her mailbox when she fell.  Family has cameras up around the house but she was in an area that was a blind spot to the camera so the fall was not seen.  She laid the ground for 15 to 20 minutes when the mailman came by the house and saw her and called for help.  Patient complained of pain in her right leg and was not able to stand up and bear weight.  She denies any numbness or weakness of her right leg or foot.  Pain is exacerbated by movement of her leg.  She has a history of right hip fracture in 2018 with repair.  Daughter reports that she initially broke her right hip in June 2018 and fell again in September 2018 fracturing around the hip prosthesis and was reoperated on.  She generally has some chronic hip pain and takes Tylenol for this twice a day.  She normally ambulates and does not use a walker or cane at home. Patient denies tobacco, alcohol use  ED Course: Julia [name] is found to have a displaced periprosthetic fracture in the mid to distal femur which extends into the intra-articular surface of the knee joint.  Hemodynamically stable.  Labs are unremarkable.  Hospitalist service asked to admit for further management  4/30- had bm. I wanted to discontinue foley but pt refused as she is scarred going to bathroom and falling. We are in agreement she will try more with PT and voiding trial tomorrow as I told her and GD that if we leave foley too long she is at risk of  infection.   5/1-no overnight issues.  Patient without any complaints today  Consultants:   Orthopedics  Procedures:   Antimicrobials:       Subjective: Denies chest pain, shortness of breath, dizziness  Objective: Vitals:   09/26/20 1521 09/26/20 1945 09/27/20 0336 09/27/20 0734  BP: (!) 141/71 (!) 150/80 (!) 142/69 (!) 137/54  Pulse: (!) 101 92 84 90  Resp: 19 16 17 18   Temp: 98.2 F (36.8 C) 98.2 F (36.8 C) 98.5 F (36.9 C) 98.9 F (37.2 C)  TempSrc: Oral Oral Oral Oral  SpO2: 94% 91% 93% 92%  Weight:      Height:        Intake/Output Summary (Last 24 hours) at 09/27/2020 0934 Last data filed at 09/26/2020 2100 Gross per 24 hour  Intake 440 ml  Output 900 ml  Net -460 ml   Filed Weights   09/22/20 1034  Weight: 54.4 kg    Examination: Calm, nad cta no w/r/r Regular s1/s2 no gallop Soft benign +bs No edema Grossly intact, awake and alert Mood and affect appropriate in current setting  Data Reviewed: I have personally reviewed following labs and imaging studies  CBC: Recent Labs  Lab 09/22/20 1057 09/23/20 0313 09/24/20 0646 09/25/20 0037 09/27/20 0056  WBC 9.5 7.0 7.8 6.7 6.2  NEUTROABS 7.9* 5.2  --   --   --  HGB 13.9 11.9* 10.9* 10.2* 10.5*  HCT 44.0 37.2 35.0* 31.7* 32.8*  MCV 90.9 89.6 92.8 91.1 89.1  PLT 214 202 160 153 854   Basic Metabolic Panel: Recent Labs  Lab 09/22/20 1057 09/23/20 0313 09/24/20 0646 09/25/20 0037 09/26/20 0212  NA 138 138 138 135 136  K 4.1 3.9 4.3 4.4 4.2  CL 105 103 104 103 103  CO2 25 25 26 28 26   GLUCOSE 117* 123* 106* 133* 107*  BUN 21 18 14 10 13   CREATININE 1.08* 1.04* 1.07* 0.93 0.90  CALCIUM 9.1 8.7* 8.1* 8.1* 8.2*   GFR: Estimated Creatinine Clearance: 40.7 mL/min (by C-G formula based on SCr of 0.9 mg/dL). Liver Function Tests: Recent Labs  Lab 09/23/20 0313  AST 22  ALT 13  ALKPHOS 54  BILITOT 1.2  PROT 5.5*  ALBUMIN 3.2*   No results for input(s): LIPASE, AMYLASE in the  last 168 hours. No results for input(s): AMMONIA in the last 168 hours. Coagulation Profile: Recent Labs  Lab 09/22/20 1057 09/23/20 0313  INR 1.0 1.1   Cardiac Enzymes: No results for input(s): CKTOTAL, CKMB, CKMBINDEX, TROPONINI in the last 168 hours. BNP (last 3 results) No results for input(s): PROBNP in the last 8760 hours. HbA1C: No results for input(s): HGBA1C in the last 72 hours. CBG: No results for input(s): GLUCAP in the last 168 hours. Lipid Profile: No results for input(s): CHOL, HDL, LDLCALC, TRIG, CHOLHDL, LDLDIRECT in the last 72 hours. Thyroid Function Tests: No results for input(s): TSH, T4TOTAL, FREET4, T3FREE, THYROIDAB in the last 72 hours. Anemia Panel: No results for input(s): VITAMINB12, FOLATE, FERRITIN, TIBC, IRON, RETICCTPCT in the last 72 hours. Sepsis Labs: No results for input(s): PROCALCITON, LATICACIDVEN in the last 168 hours.  Recent Results (from the past 240 hour(s))  SARS CORONAVIRUS 2 (TAT 6-24 HRS) Nasopharyngeal Nasopharyngeal Swab     Status: None   Collection Time: 09/22/20  5:41 PM   Specimen: Nasopharyngeal Swab  Result Value Ref Range Status   SARS Coronavirus 2 NEGATIVE NEGATIVE Final    Comment: (NOTE) SARS-CoV-2 target nucleic acids are NOT DETECTED.  The SARS-CoV-2 RNA is generally detectable in upper and lower respiratory specimens during the acute phase of infection. Negative results do not preclude SARS-CoV-2 infection, do not rule out co-infections with other pathogens, and should not be used as the sole basis for treatment or other patient management decisions. Negative results must be combined with clinical observations, patient history, and epidemiological information. The expected result is Negative.  Fact Sheet for Patients: SugarRoll.be  Fact Sheet for Healthcare Providers: https://www.woods-mathews.com/  This test is not yet approved or cleared by the [place] FDA  and  has been authorized for detection and/or diagnosis of SARS-CoV-2 by FDA under an Emergency Use Authorization (EUA). This EUA will remain  in effect (meaning this test can be used) for the duration of the COVID-19 declaration under Se ction 564(b)(1) of the Act, 21 U.S.C. section 360bbb-3(b)(1), unless the authorization is terminated or revoked sooner.  Performed at [facility], [place] [address] [place], [place] [address]   MRSA PCR Screening     Status: None   Collection Time: 09/23/20  5:59 AM   Specimen: Nasal Mucosa; Nasopharyngeal  Result Value Ref Range Status   MRSA by PCR NEGATIVE NEGATIVE Final    Comment:        The GeneXpert MRSA Assay (FDA approved for NASAL specimens only), is one component of a comprehensive MRSA colonization  surveillance program. It is not intended to diagnose MRSA infection nor to guide or monitor treatment for MRSA infections. Performed at [facility], [place] [address], [place], [place] [address]          Radiology Studies: No results found.      Scheduled Meds: . amLODipine  5 mg Oral Daily  . Chlorhexidine Gluconate Cloth  6 each Topical Daily  . cholecalciferol  2,000 Units Oral Daily  . docusate sodium  100 mg Oral BID  . enoxaparin (LOVENOX) injection  40 mg Subcutaneous Q24H  . galantamine  24 mg Oral q AM  . lactose free nutrition  237 mL Oral Q24H  . levothyroxine  88 mcg Oral QAC breakfast  . memantine  28 mg Oral QHS  . mirtazapine  15 mg Oral QHS  . multivitamin with minerals  1 tablet Oral Daily  . polyethylene glycol  17 g Oral BID  . QUEtiapine  25 mg Oral QHS  . senna-docusate  1 tablet Oral BID   Continuous Infusions:   Assessment & Plan:   Active Problems:   Essential hypertension   Alzheimer's dementia ([place])   Closed fracture of shaft of right femur (HCC)   CKD (chronic kidney disease) stage 3, GFR 30-59 ml/min (HCC)  Closed fracture of shaft of right femur  Status post ORIF on 4/27    PT recommends SNF, family interested in [place] following  Follow-up with Ortho in 2 weeks after discharge for repeat x-ray and wound check Okay to begin shower with assistant.  Incision may get wet WBAT RLE, okay for unrestricted range of motion of the knee incision and dressing.Faythe Ghee to leave incision open to air 5/1-d/c foley Pain mx Bowel reigmen PT/OT recommend SNF      Essential hypertension Mildly elevated, will increase amlodipine to 10 mg daily   CKD (chronic kidney disease) stage 3 A Stable Continue to monitor periodically  Post op blood loss-  Hemoglobin stable.   Continue to monitor periodically      Alzheimer's dementia  Continue Namenda and Razadyne    DVT prophylaxis: SCD, lovenox Code Status: Full Family Communication: None at bedside  Status is: Inpatient  Remains inpatient appropriate because: Unsafe discharge               dispo: The patient is from: Home              Anticipated d/c is to: SNF              Patient currently is medically stable   Difficult to place patient No            LOS: 5 days   Time spent: 35 minutes with more than 50% COC    Nolberto Hanlon, MD [facility] Pager [phone]  If 7PM-7AM, please contact night-coverage 09/27/2020, 9:34 AM

## 2020-09-27 NOTE — TOC Progression Note (Signed)
Transition of Care [facility]) - Progression Note    Patient Details  Name: Julia [name] MRN: [id number] Date of Birth: 1934-06-24  Transition of Care [facility]) CM/SW [place], [place] Phone Number: 09/27/2020, 12:54 PM  Clinical Narrative:    CSW started auth for pt, ref number [id number]. Choice needs to be updated for pt when pt is ready to dc.    Expected Discharge Plan: [place] Barriers to Discharge: [place] Work up,SNF Pending bed offer  Expected Discharge Plan and Services Expected Discharge Plan: [place] arrangements for the past 2 months: Single Family Home                                       Social Determinants of Health (SDOH) Interventions    Readmission Risk Interventions No flowsheet data found.

## 2020-09-27 NOTE — Plan of Care (Signed)

## 2020-09-28 DIAGNOSIS — S72301S Unspecified fracture of shaft of right femur, sequela: Secondary | ICD-10-CM | POA: Diagnosis not present

## 2020-09-28 DIAGNOSIS — G308 Other Alzheimer's disease: Secondary | ICD-10-CM | POA: Diagnosis not present

## 2020-09-28 DIAGNOSIS — R279 Unspecified lack of coordination: Secondary | ICD-10-CM | POA: Diagnosis not present

## 2020-09-28 DIAGNOSIS — M978XXD Periprosthetic fracture around other internal prosthetic joint, subsequent encounter: Secondary | ICD-10-CM | POA: Diagnosis not present

## 2020-09-28 DIAGNOSIS — E440 Moderate protein-calorie malnutrition: Secondary | ICD-10-CM

## 2020-09-28 DIAGNOSIS — I150 Renovascular hypertension: Secondary | ICD-10-CM | POA: Diagnosis not present

## 2020-09-28 DIAGNOSIS — S72044D Nondisplaced fracture of base of neck of right femur, subsequent encounter for closed fracture with routine healing: Secondary | ICD-10-CM | POA: Diagnosis not present

## 2020-09-28 DIAGNOSIS — E039 Hypothyroidism, unspecified: Secondary | ICD-10-CM | POA: Diagnosis not present

## 2020-09-28 DIAGNOSIS — I129 Hypertensive chronic kidney disease with stage 1 through stage 4 chronic kidney disease, or unspecified chronic kidney disease: Secondary | ICD-10-CM | POA: Diagnosis not present

## 2020-09-28 DIAGNOSIS — S72001D Fracture of unspecified part of neck of right femur, subsequent encounter for closed fracture with routine healing: Secondary | ICD-10-CM | POA: Diagnosis not present

## 2020-09-28 DIAGNOSIS — S72391S Other fracture of shaft of right femur, sequela: Secondary | ICD-10-CM | POA: Diagnosis not present

## 2020-09-28 DIAGNOSIS — I509 Heart failure, unspecified: Secondary | ICD-10-CM | POA: Diagnosis not present

## 2020-09-28 DIAGNOSIS — D62 Acute posthemorrhagic anemia: Secondary | ICD-10-CM | POA: Diagnosis not present

## 2020-09-28 DIAGNOSIS — Z743 Need for continuous supervision: Secondary | ICD-10-CM | POA: Diagnosis not present

## 2020-09-28 DIAGNOSIS — I10 Essential (primary) hypertension: Secondary | ICD-10-CM | POA: Diagnosis not present

## 2020-09-28 DIAGNOSIS — W19XXXA Unspecified fall, initial encounter: Secondary | ICD-10-CM | POA: Diagnosis not present

## 2020-09-28 DIAGNOSIS — G309 Alzheimer's disease, unspecified: Secondary | ICD-10-CM | POA: Diagnosis not present

## 2020-09-28 DIAGNOSIS — D649 Anemia, unspecified: Secondary | ICD-10-CM | POA: Diagnosis not present

## 2020-09-28 DIAGNOSIS — R262 Difficulty in walking, not elsewhere classified: Secondary | ICD-10-CM | POA: Diagnosis not present

## 2020-09-28 DIAGNOSIS — M9701XA Periprosthetic fracture around internal prosthetic right hip joint, initial encounter: Secondary | ICD-10-CM | POA: Diagnosis not present

## 2020-09-28 DIAGNOSIS — N183 Chronic kidney disease, stage 3 unspecified: Secondary | ICD-10-CM | POA: Diagnosis not present

## 2020-09-28 DIAGNOSIS — K219 Gastro-esophageal reflux disease without esophagitis: Secondary | ICD-10-CM | POA: Diagnosis not present

## 2020-09-28 DIAGNOSIS — R52 Pain, unspecified: Secondary | ICD-10-CM | POA: Diagnosis not present

## 2020-09-28 DIAGNOSIS — Z96649 Presence of unspecified artificial hip joint: Secondary | ICD-10-CM | POA: Diagnosis not present

## 2020-09-28 DIAGNOSIS — M80051D Age-related osteoporosis with current pathological fracture, right femur, subsequent encounter for fracture with routine healing: Secondary | ICD-10-CM | POA: Diagnosis not present

## 2020-09-28 DIAGNOSIS — S72301D Unspecified fracture of shaft of right femur, subsequent encounter for closed fracture with routine healing: Secondary | ICD-10-CM | POA: Diagnosis not present

## 2020-09-28 DIAGNOSIS — S72401D Unspecified fracture of lower end of right femur, subsequent encounter for closed fracture with routine healing: Secondary | ICD-10-CM | POA: Diagnosis not present

## 2020-09-28 DIAGNOSIS — R1311 Dysphagia, oral phase: Secondary | ICD-10-CM | POA: Diagnosis not present

## 2020-09-28 DIAGNOSIS — R2681 Unsteadiness on feet: Secondary | ICD-10-CM | POA: Diagnosis not present

## 2020-09-28 DIAGNOSIS — R531 Weakness: Secondary | ICD-10-CM | POA: Diagnosis not present

## 2020-09-28 DIAGNOSIS — M6281 Muscle weakness (generalized): Secondary | ICD-10-CM | POA: Diagnosis not present

## 2020-09-28 DIAGNOSIS — R296 Repeated falls: Secondary | ICD-10-CM | POA: Diagnosis not present

## 2020-09-28 DIAGNOSIS — E038 Other specified hypothyroidism: Secondary | ICD-10-CM | POA: Diagnosis not present

## 2020-09-28 DIAGNOSIS — Z515 Encounter for palliative care: Secondary | ICD-10-CM | POA: Diagnosis not present

## 2020-09-28 DIAGNOSIS — G301 Alzheimer's disease with late onset: Secondary | ICD-10-CM | POA: Diagnosis not present

## 2020-09-28 DIAGNOSIS — S79929A Unspecified injury of unspecified thigh, initial encounter: Secondary | ICD-10-CM | POA: Diagnosis not present

## 2020-09-28 DIAGNOSIS — R2689 Other abnormalities of gait and mobility: Secondary | ICD-10-CM | POA: Diagnosis not present

## 2020-09-28 DIAGNOSIS — R600 Localized edema: Secondary | ICD-10-CM | POA: Diagnosis not present

## 2020-09-28 DIAGNOSIS — D6489 Other specified anemias: Secondary | ICD-10-CM | POA: Diagnosis not present

## 2020-09-28 DIAGNOSIS — E782 Mixed hyperlipidemia: Secondary | ICD-10-CM | POA: Diagnosis not present

## 2020-09-28 LAB — RESP PANEL BY RT-PCR (FLU A&B, COVID) ARPGX2
Influenza A by PCR: NEGATIVE
Influenza B by PCR: NEGATIVE
SARS Coronavirus 2 by RT PCR: NEGATIVE

## 2020-09-28 MED ORDER — AMLODIPINE BESYLATE 10 MG PO TABS: 10.0000 mg | ORAL_TABLET | Freq: Every day | ORAL | 0 refills | Status: DC

## 2020-09-28 MED ORDER — ACETAMINOPHEN 325 MG PO TABS: 650.0000 mg | ORAL_TABLET | Freq: Four times a day (QID) | ORAL | Status: AC | PRN

## 2020-09-28 NOTE — Discharge Summary (Signed)
Julia [name] [id number] DOB: [birth year] DOA: 09/22/2020  PCP: Dorothyann Peng, NP  Admit date: 09/22/2020 Discharge date: 09/28/2020  Admitted From: Home Disposition: SNF  Recommendations for Outpatient Follow-up:  1. Follow up with PCP in 1 week 2. Please obtain BMP/CBC in one week 3. Follow-up with orthopedics and 2 weeks after discharge for repeat x-rays and wound check     Discharge Condition:Stable CODE STATUS: Full Diet recommendation: Heart Healthy  Brief/Interim Summary: Per HPI: Patient was at home with her husband and had walked down the gravel driveway toward her mailbox when she fell.Patient complained of pain in her right leg and was not able to stand up and bear weight.found to have a displaced periprosthetic fracture in the mid to distal femur which extends into the intra-articular surface of the knee joint.   Closed fracture of shaft of right femur  Status post ORIF on 4/27  PT recommended SNF Follow-up with Ortho in 2 weeks after discharge for repeat x-ray and wound check Okay to begin shower with assistant.  Incision may get wet WBAT RLE, okay for unrestricted range of motion of the knee incision and dressing.Faythe Ghee to leave incision open to air Continue pain mx and bowel regimen.     Essential hypertension-continue with amlodipine 10mg  qdaily Monitor    CKD (chronic kidney disease) stage 3 A Stable   Post op blood loss-  Hemoglobin stable.      Alzheimer's dementia  Continue outpatient meds     Discharge Diagnoses:  Active Problems:   Essential hypertension   Alzheimer's dementia ([place])   Closed fracture of shaft of right femur (HCC)   CKD (chronic kidney disease) stage 3, GFR 30-59 ml/min (HCC)   Malnutrition of moderate degree    Discharge Instructions  Discharge Instructions    Call MD for:  temperature >100.4   Complete by: As directed    Diet - low sodium heart healthy   Complete by: As directed    Increase  activity slowly   Complete by: As directed    Leave dressing on - Keep it clean, dry, and intact until clinic visit   Complete by: As directed      Allergies as of 09/28/2020      Reactions   Entex Other (See Comments)   Reaction unknown    Sulfonamide Derivatives Other (See Comments)   Reaction is not known- allergy is from years ago      Medication List    TAKE these medications   acetaminophen 325 MG tablet Commonly known as: TYLENOL Take 2 tablets (650 mg total) by mouth every 6 (six) hours as needed for mild pain. What changed:   medication strength  how much to take  when to take this  reasons to take this   amLODipine 10 MG tablet Commonly known as: NORVASC Take 1 tablet (10 mg total) by mouth daily. Start taking on: Sep 29, 2020 What changed:   medication strength  how much to take   Besifloxacin HCl 0.6 % Susp Place 1 drop into both eyes at bedtime.   CENTRUM SILVER PO Take 1 tablet by mouth daily.   docusate sodium 100 MG capsule Commonly known as: COLACE Take 1 capsule (100 mg total) by mouth 2 (two) times daily.   enoxaparin 40 MG/0.4ML injection Commonly known as: LOVENOX Inject 0.4 mLs (40 mg total) into the skin daily for 28 days.   galantamine 24 MG 24 hr capsule Commonly known as: RAZADYNE ER TAKE 1  CAPSULE(24 MG) BY MOUTH DAILY What changed: See the new instructions.   latanoprost 0.005 % ophthalmic solution Commonly known as: XALATAN Place 1 drop into both eyes at bedtime.   levothyroxine 88 MCG tablet Commonly known as: SYNTHROID Take 1 tablet (88 mcg total) by mouth daily before breakfast.   memantine 28 MG Cp24 24 hr capsule Commonly known as: NAMENDA XR TAKE 1 CAPSULE(28 MG) BY MOUTH DAILY What changed: See the new instructions.   mirtazapine 15 MG tablet Commonly known as: REMERON TAKE 1 TABLET BY MOUTH AT BEDTIME   polyethylene glycol 17 g packet Commonly known as: MIRALAX / GLYCOLAX Take 17 g by mouth daily as needed  for mild constipation. Reported on 10/17/2015   QUEtiapine 25 MG tablet Commonly known as: SEROQUEL TAKE 1 TABLET BY MOUTH AT BEDTIME   traMADol 50 MG tablet Commonly known as: ULTRAM Take 1 tablet (50 mg total) by mouth every 12 (twelve) hours as needed for moderate pain or severe pain.   Vitamin D3 25 MCG tablet Commonly known as: Vitamin D Take 2 tablets (2,000 Units total) by mouth daily.            Discharge Care Instructions  (From admission, onward)         Start     Ordered   09/28/20 0000  Leave dressing on - Keep it clean, dry, and intact until clinic visit        09/28/20 1255          Follow-up Information    Haddix, Thomasene Lot, MD. Schedule an appointment as soon as possible for a visit in 2 week(s).   Specialty: Orthopedic Surgery Why: for wound check and repeat x-rays Contact information: [place] [address] [phone]              Allergies  Allergen Reactions  . Entex Other (See Comments)    Reaction unknown   . Sulfonamide Derivatives Other (See Comments)    Reaction is not known- allergy is from years ago    Consultations:  orthopedics   Procedures/Studies: DG Chest 1 View  Result Date: 09/22/2020 CLINICAL DATA:  Fall. EXAM: CHEST  1 VIEW COMPARISON:  02/19/2018. FINDINGS: Mediastinum hilar structures normal. Cardiomegaly. No pulmonary venous congestion. No focal infiltrate. No pleural effusion or pneumothorax. Thoracic spine scoliosis and degenerative change. No displaced rib fracture. IMPRESSION: 1. Cardiomegaly. No pulmonary venous congestion. No focal infiltrate. 2. Mild degenerative change thoracic spine. No evidence of displaced rib fracture. No pneumothorax. Electronically Signed   By: Marcello Moores  Register   On: 09/22/2020 11:54   DG Elbow 2 Views Right  Result Date: 09/22/2020 CLINICAL DATA:  86 year old female with fall and right elbow pain. EXAM: RIGHT ELBOW - 2 VIEW COMPARISON:  None. FINDINGS: There is no  acute fracture or dislocation. The bones are osteopenic. No significant arthritic changes. No joint effusion. The soft tissues unremarkable. IMPRESSION: Negative. Electronically Signed   By: Anner Crete M.D.   On: 09/22/2020 20:26   CT HEAD WO CONTRAST  Result Date: 09/22/2020 CLINICAL DATA:  Multiple falls EXAM: CT HEAD WITHOUT CONTRAST TECHNIQUE: Contiguous axial images were obtained from the base of the skull through the vertex without intravenous contrast. COMPARISON:  February 19, 2018 FINDINGS: Brain: No evidence of acute large vascular territory infarction, hemorrhage, hydrocephalus, extra-axial collection or mass lesion/mass effect. Stable moderate to severe global parenchymal volume loss with ex vacuo dilatation of ventricular system. Similar moderate to severe burden of chronic small  vessel ischemic disease. Vascular: Dolichoectatic intracranial vessels stable from prior imaging seen with chronic hypertension. Scattered atherosclerotic calcifications of the internal carotid and vertebral arteries at the skull base. Skull: Normal. Negative for fracture or focal lesion. Sinuses/Orbits: Opacified right ethmoid air cells, otherwise the paranasal sinuses and mastoid air cells are predominantly clear. Other: None IMPRESSION: 1. No acute intracranial findings. 2. Stable moderate to severe global parenchymal volume loss and similar chronic small vessel ischemic disease. Electronically Signed   By: Dahlia Bailiff MD   On: 09/22/2020 15:10   DG C-Arm 1-60 Min  Result Date: 09/23/2020 CLINICAL DATA:  ORIF right femur. EXAM: DG C-ARM 1-60 MIN; RIGHT FEMUR 2 VIEWS FLUOROSCOPY TIME:  Fluoroscopy Time:  59 seconds. Radiation Exposure Index (if provided by the fluoroscopic device): 4.56 mGy. Number of Acquired Spot Images: 7 COMPARISON:  September 22, 2020 FINDINGS: Seven C-arm fluoroscopic images were obtained intraoperatively and submitted for post operative interpretation. These images demonstrate plate and  screw fixation of the femur. Final images demonstrate improved alignment the periprosthetic distal femur fracture. Partially imaged prior intramedullary nail fixation hardware. Please see the performing provider's procedural report for further detail. IMPRESSION: Intraoperative fluoroscopy, as detailed above. Electronically Signed   By: Margaretha Sheffield MD   On: 09/23/2020 14:17   DG Hip Unilat With Pelvis 2-3 Views Right  Result Date: 09/22/2020 CLINICAL DATA:  Fall.  Right hip pain EXAM: DG HIP (WITH OR WITHOUT PELVIS) 2-3V RIGHT COMPARISON:  03/27/2017 FINDINGS: Bones are diffusely osteopenic. Previous ORIF proximal right femur fracture. IM nail and distal interlocking hip screw are identified. No signs of dislocation or fracture. The distal portion of the IM nail is not included on this current series of films. IMPRESSION: 1. No acute findings. 2. Previous ORIF of proximal right femur fracture. Electronically Signed   By: Kerby Moors M.D.   On: 09/22/2020 11:54   DG FEMUR, MIN 2 VIEWS RIGHT  Result Date: 09/23/2020 CLINICAL DATA:  ORIF right femur. EXAM: DG C-ARM 1-60 MIN; RIGHT FEMUR 2 VIEWS FLUOROSCOPY TIME:  Fluoroscopy Time:  59 seconds. Radiation Exposure Index (if provided by the fluoroscopic device): 4.56 mGy. Number of Acquired Spot Images: 7 COMPARISON:  September 22, 2020 FINDINGS: Seven C-arm fluoroscopic images were obtained intraoperatively and submitted for post operative interpretation. These images demonstrate plate and screw fixation of the femur. Final images demonstrate improved alignment the periprosthetic distal femur fracture. Partially imaged prior intramedullary nail fixation hardware. Please see the performing provider's procedural report for further detail. IMPRESSION: Intraoperative fluoroscopy, as detailed above. Electronically Signed   By: Margaretha Sheffield MD   On: 09/23/2020 14:17   DG Femur Min 2 Views Right  Result Date: 09/22/2020 CLINICAL DATA:  pain, fall EXAM:  RIGHT FEMUR 2 VIEWS COMPARISON:  Right hip radiograph 03/26/2017, fluoroscopic images 03/27/2017 FINDINGS: There is a posteriorly displaced periprosthetic mid to distal femur fracture, with likely intra-articular involvement of the knee joint. Intramedullary fixation hardware is intact but displaced due to the fracture. Old healed proximal femur fracture. Adjacent soft tissue swelling. Laxity of the patellar tendon in part due to knee hyperextension. IMPRESSION: Posteriorly displaced periprosthetic fracture of the mid to distal femur, with likely intra-articular extension to the knee joint. Electronically Signed   By: Maurine Simmering   On: 09/22/2020 15:17   DG FEMUR PORT, MIN 2 VIEWS RIGHT  Result Date: 09/23/2020 CLINICAL DATA:  Postop femur fracture fixation. EXAM: RIGHT FEMUR PORTABLE 2 VIEW COMPARISON:  Preoperative radiograph yesterday. FINDINGS: Interval lateral plate  and multi screw fixation of periprosthetic distal femur fracture. The fracture is in improved alignment compared to preoperative imaging. Intramedullary rod with trans trochanteric screw fixation again seen recent postsurgical change includes air and edema in the soft tissues. IMPRESSION: ORIF of periprosthetic distal femur fracture, in improved alignment compared to preoperative imaging. Electronically Signed   By: Keith Rake M.D.   On: 09/23/2020 15:49       Subjective: No complaints this am. Denies sob, cp  Discharge Exam: Vitals:   09/28/20 0441 09/28/20 0755  BP: 134/66 (!) 149/65  Pulse: 83 86  Resp: 18 17  Temp: 97.6 F (36.4 C) 97.6 F (36.4 C)  SpO2: 95% 93%   Vitals:   09/27/20 1802 09/27/20 2100 09/28/20 0441 09/28/20 0755  BP: 129/71 139/65 134/66 (!) 149/65  Pulse: 89 87 83 86  Resp:  17 18 17   Temp: 98.9 F (37.2 C) 98.2 F (36.8 C) 97.6 F (36.4 C) 97.6 F (36.4 C)  TempSrc: Oral Oral Oral Oral  SpO2:  95% 95% 93%  Weight:      Height:        General: Pt is alert, awake, not in acute  distress, daughter at bedside Cardiovascular: RRR, S1/S2 +, no rubs, no gallops Respiratory: CTA bilaterally, no wheezing, no rhonchi Abdominal: Soft, NT, ND, bowel sounds + Extremities: no edema    The results of significant diagnostics from this hospitalization (including imaging, microbiology, ancillary and laboratory) are listed below for reference.     Microbiology: Recent Results (from the past 240 hour(s))  SARS CORONAVIRUS 2 (TAT 6-24 HRS) Nasopharyngeal Nasopharyngeal Swab     Status: None   Collection Time: 09/22/20  5:41 PM   Specimen: Nasopharyngeal Swab  Result Value Ref Range Status   SARS Coronavirus 2 NEGATIVE NEGATIVE Final    Comment: (NOTE) SARS-CoV-2 target nucleic acids are NOT DETECTED.  The SARS-CoV-2 RNA is generally detectable in upper and lower respiratory specimens during the acute phase of infection. Negative results do not preclude SARS-CoV-2 infection, do not rule out co-infections with other pathogens, and should not be used as the sole basis for treatment or other patient management decisions. Negative results must be combined with clinical observations, patient history, and epidemiological information. The expected result is Negative.  Fact Sheet for Patients: SugarRoll.be  Fact Sheet for Healthcare Providers: https://www.woods-mathews.com/  This test is not yet approved or cleared by the [place] FDA and  has been authorized for detection and/or diagnosis of SARS-CoV-2 by FDA under an Emergency Use Authorization (EUA). This EUA will remain  in effect (meaning this test can be used) for the duration of the COVID-19 declaration under Se ction 564(b)(1) of the Act, 21 U.S.C. section 360bbb-3(b)(1), unless the authorization is terminated or revoked sooner.  Performed at [facility], [place] [address], [place], [place] [address]   MRSA PCR Screening     Status: None   Collection Time:  09/23/20  5:59 AM   Specimen: Nasal Mucosa; Nasopharyngeal  Result Value Ref Range Status   MRSA by PCR NEGATIVE NEGATIVE Final    Comment:        The GeneXpert MRSA Assay (FDA approved for NASAL specimens only), is one component of a comprehensive MRSA colonization surveillance program. It is not intended to diagnose MRSA infection nor to guide or monitor treatment for MRSA infections. Performed at [facility], [place] [address] [place], [place] [address]   Resp Panel by RT-PCR (Flu A&B, Covid) Nasopharyngeal  Swab     Status: None   Collection Time: 09/28/20  9:48 AM   Specimen: Nasopharyngeal Swab; Nasopharyngeal(NP) swabs in vial transport medium  Result Value Ref Range Status   SARS Coronavirus 2 by RT PCR NEGATIVE NEGATIVE Final    Comment: (NOTE) SARS-CoV-2 target nucleic acids are NOT DETECTED.  The SARS-CoV-2 RNA is generally detectable in upper respiratory specimens during the acute phase of infection. The lowest concentration of SARS-CoV-2 viral copies this assay can detect is 138 copies/mL. A negative result does not preclude SARS-Cov-2 infection and should not be used as the sole basis for treatment or other patient management decisions. A negative result may occur with  improper specimen collection/handling, submission of specimen other than nasopharyngeal swab, presence of viral mutation(s) within the areas targeted by this assay, and inadequate number of viral copies(<138 copies/mL). A negative result must be combined with clinical observations, patient history, and epidemiological information. The expected result is Negative.  Fact Sheet for Patients:  EntrepreneurPulse.com.au  Fact Sheet for Healthcare Providers:  IncredibleEmployment.be  This test is no t yet approved or cleared by the [place] FDA and  has been authorized for detection and/or diagnosis of SARS-CoV-2 by FDA under an Emergency Use Authorization  (EUA). This EUA will remain  in effect (meaning this test can be used) for the duration of the COVID-19 declaration under Section 564(b)(1) of the Act, 21 U.S.C.section 360bbb-3(b)(1), unless the authorization is terminated  or revoked sooner.       Influenza A by PCR NEGATIVE NEGATIVE Final   Influenza B by PCR NEGATIVE NEGATIVE Final    Comment: (NOTE) The Xpert Xpress SARS-CoV-2/FLU/RSV plus assay is intended as an aid in the diagnosis of influenza from Nasopharyngeal swab specimens and should not be used as a sole basis for treatment. Nasal washings and aspirates are unacceptable for Xpert Xpress SARS-CoV-2/FLU/RSV testing.  Fact Sheet for Patients: EntrepreneurPulse.com.au  Fact Sheet for Healthcare Providers: IncredibleEmployment.be  This test is not yet approved or cleared by the [place] FDA and has been authorized for detection and/or diagnosis of SARS-CoV-2 by FDA under an Emergency Use Authorization (EUA). This EUA will remain in effect (meaning this test can be used) for the duration of the COVID-19 declaration under Section 564(b)(1) of the Act, 21 U.S.C. section 360bbb-3(b)(1), unless the authorization is terminated or revoked.  Performed at [facility], [place] [address] [place], [place] [address]      Labs: BNP (last 3 results) No results for input(s): BNP in the last 8760 hours. Basic Metabolic Panel: Recent Labs  Lab 09/22/20 1057 09/23/20 0313 09/24/20 0646 09/25/20 0037 09/26/20 0212  NA 138 138 138 135 136  K 4.1 3.9 4.3 4.4 4.2  CL 105 103 104 103 103  CO2 25 25 26 28 26   GLUCOSE 117* 123* 106* 133* 107*  BUN 21 18 14 10 13   CREATININE 1.08* 1.04* 1.07* 0.93 0.90  CALCIUM 9.1 8.7* 8.1* 8.1* 8.2*   Liver Function Tests: Recent Labs  Lab 09/23/20 0313  AST 22  ALT 13  ALKPHOS 54  BILITOT 1.2  PROT 5.5*  ALBUMIN 3.2*   No results for input(s): LIPASE, AMYLASE in the last 168 hours. No  results for input(s): AMMONIA in the last 168 hours. CBC: Recent Labs  Lab 09/22/20 1057 09/23/20 0313 09/24/20 0646 09/25/20 0037 09/27/20 0056  WBC 9.5 7.0 7.8 6.7 6.2  NEUTROABS 7.9* 5.2  --   --   --   HGB 13.9  11.9* 10.9* 10.2* 10.5*  HCT 44.0 37.2 35.0* 31.7* 32.8*  MCV 90.9 89.6 92.8 91.1 89.1  PLT 214 202 160 153 226   Cardiac Enzymes: No results for input(s): CKTOTAL, CKMB, CKMBINDEX, TROPONINI in the last 168 hours. BNP: Invalid input(s): POCBNP CBG: No results for input(s): GLUCAP in the last 168 hours. D-Dimer No results for input(s): DDIMER in the last 72 hours. Hgb A1c No results for input(s): HGBA1C in the last 72 hours. Lipid Profile No results for input(s): CHOL, HDL, LDLCALC, TRIG, CHOLHDL, LDLDIRECT in the last 72 hours. Thyroid function studies No results for input(s): TSH, T4TOTAL, T3FREE, THYROIDAB in the last 72 hours.  Invalid input(s): FREET3 Anemia work up No results for input(s): VITAMINB12, FOLATE, FERRITIN, TIBC, IRON, RETICCTPCT in the last 72 hours. Urinalysis    Component Value Date/Time   COLORURINE YELLOW 09/23/2020 0130   APPEARANCEUR HAZY (A) 09/23/2020 0130   LABSPEC 1.014 09/23/2020 0130   PHURINE 7.0 09/23/2020 0130   GLUCOSEU NEGATIVE 09/23/2020 0130   HGBUR SMALL (A) 09/23/2020 0130   HGBUR negative 03/02/2010 1020   BILIRUBINUR NEGATIVE 09/23/2020 0130   BILIRUBINUR n 07/09/2015 1041   KETONESUR NEGATIVE 09/23/2020 0130   PROTEINUR NEGATIVE 09/23/2020 0130   UROBILINOGEN 1.0 07/09/2015 1041   UROBILINOGEN 1.0 09/05/2014 0400   NITRITE POSITIVE (A) 09/23/2020 0130   LEUKOCYTESUR NEGATIVE 09/23/2020 0130   Sepsis Labs Invalid input(s): PROCALCITONIN,  WBC,  LACTICIDVEN Microbiology Recent Results (from the past 240 hour(s))  SARS CORONAVIRUS 2 (TAT 6-24 HRS) Nasopharyngeal Nasopharyngeal Swab     Status: None   Collection Time: 09/22/20  5:41 PM   Specimen: Nasopharyngeal Swab  Result Value Ref Range Status   SARS  Coronavirus 2 NEGATIVE NEGATIVE Final    Comment: (NOTE) SARS-CoV-2 target nucleic acids are NOT DETECTED.  The SARS-CoV-2 RNA is generally detectable in upper and lower respiratory specimens during the acute phase of infection. Negative results do not preclude SARS-CoV-2 infection, do not rule out co-infections with other pathogens, and should not be used as the sole basis for treatment or other patient management decisions. Negative results must be combined with clinical observations, patient history, and epidemiological information. The expected result is Negative.  Fact Sheet for Patients: SugarRoll.be  Fact Sheet for Healthcare Providers: https://www.woods-mathews.com/  This test is not yet approved or cleared by the [place] FDA and  has been authorized for detection and/or diagnosis of SARS-CoV-2 by FDA under an Emergency Use Authorization (EUA). This EUA will remain  in effect (meaning this test can be used) for the duration of the COVID-19 declaration under Se ction 564(b)(1) of the Act, 21 U.S.C. section 360bbb-3(b)(1), unless the authorization is terminated or revoked sooner.  Performed at [facility], [place] [address], [place], [place] [address]   MRSA PCR Screening     Status: None   Collection Time: 09/23/20  5:59 AM   Specimen: Nasal Mucosa; Nasopharyngeal  Result Value Ref Range Status   MRSA by PCR NEGATIVE NEGATIVE Final    Comment:        The GeneXpert MRSA Assay (FDA approved for NASAL specimens only), is one component of a comprehensive MRSA colonization surveillance program. It is not intended to diagnose MRSA infection nor to guide or monitor treatment for MRSA infections. Performed at [facility], [place] [address] [place], [place] [address]   Resp Panel by RT-PCR (Flu A&B, Covid) Nasopharyngeal Swab     Status: None   Collection Time: 09/28/20  9:48 AM   Specimen: Nasopharyngeal Swab;  Nasopharyngeal(NP) swabs in vial transport medium  Result Value Ref Range Status   SARS Coronavirus 2 by RT PCR NEGATIVE NEGATIVE Final    Comment: (NOTE) SARS-CoV-2 target nucleic acids are NOT DETECTED.  The SARS-CoV-2 RNA is generally detectable in upper respiratory specimens during the acute phase of infection. The lowest concentration of SARS-CoV-2 viral copies this assay can detect is 138 copies/mL. A negative result does not preclude SARS-Cov-2 infection and should not be used as the sole basis for treatment or other patient management decisions. A negative result may occur with  improper specimen collection/handling, submission of specimen other than nasopharyngeal swab, presence of viral mutation(s) within the areas targeted by this assay, and inadequate number of viral copies(<138 copies/mL). A negative result must be combined with clinical observations, patient history, and epidemiological information. The expected result is Negative.  Fact Sheet for Patients:  EntrepreneurPulse.com.au  Fact Sheet for Healthcare Providers:  IncredibleEmployment.be  This test is no t yet approved or cleared by the [place] FDA and  has been authorized for detection and/or diagnosis of SARS-CoV-2 by FDA under an Emergency Use Authorization (EUA). This EUA will remain  in effect (meaning this test can be used) for the duration of the COVID-19 declaration under Section 564(b)(1) of the Act, 21 U.S.C.section 360bbb-3(b)(1), unless the authorization is terminated  or revoked sooner.       Influenza A by PCR NEGATIVE NEGATIVE Final   Influenza B by PCR NEGATIVE NEGATIVE Final    Comment: (NOTE) The Xpert Xpress SARS-CoV-2/FLU/RSV plus assay is intended as an aid in the diagnosis of influenza from Nasopharyngeal swab specimens and should not be used as a sole basis for treatment. Nasal washings and aspirates are unacceptable for Xpert Xpress  SARS-CoV-2/FLU/RSV testing.  Fact Sheet for Patients: EntrepreneurPulse.com.au  Fact Sheet for Healthcare Providers: IncredibleEmployment.be  This test is not yet approved or cleared by the [place] FDA and has been authorized for detection and/or diagnosis of SARS-CoV-2 by FDA under an Emergency Use Authorization (EUA). This EUA will remain in effect (meaning this test can be used) for the duration of the COVID-19 declaration under Section 564(b)(1) of the Act, 21 U.S.C. section 360bbb-3(b)(1), unless the authorization is terminated or revoked.  Performed at [facility], [place] [address], [place], [place] [address]      Time coordinating discharge: Over 30 minutes  SIGNED:   Nolberto Hanlon, MD  [facility] 09/28/2020, 12:55 PM Pager   If 7PM-7AM, please contact night-coverage [email or web address] Password [email or web address]

## 2020-09-28 NOTE — Care Management Important Message (Signed)
Important Message  Patient Details  Name: Julia [name] MRN: [id number] Date of Birth: 07-06-1934   Medicare Important Message Given:  Yes     Keyri Salberg P [place] 09/28/2020, 1:50 PM

## 2020-09-28 NOTE — Plan of Care (Signed)
  Problem: Activity: Goal: Risk for activity intolerance will decrease Outcome: Progressing   Problem: Pain Managment: Goal: General experience of comfort will improve Outcome: Progressing   

## 2020-09-28 NOTE — Discharge Instructions (Signed)
[facility] Discharge Instructions   General Discharge Instructions  WEIGHT BEARING STATUS:wieghtbearing as tolerated right lower extremity  RANGE OF MOTION/ACTIVITY: ok for hip and knee motion as tolerated  Wound Care: Incisions can be left open to air if there is no drainage. If incision continues to have drainage, follow wound care instructions below. Okay to shower if no drainage from incisions.  DVT/PE prophylaxis: Lovenox  Diet: as you were eating previously.  Can use over the counter stool softeners and bowel preparations, such as Miralax, to help with bowel movements.  Narcotics can be constipating.  Be sure to drink plenty of fluids  PAIN MEDICATION USE AND EXPECTATIONS  You have likely been given narcotic medications to help control your pain.  After a traumatic event that results in an fracture (broken bone) with or without surgery, it is ok to use narcotic pain medications to help control one's pain.  We understand that everyone responds to pain differently and each individual patient will be evaluated on a regular basis for the continued need for narcotic medications. Ideally, narcotic medication use should last no more than 6-8 weeks (coinciding with fracture healing).   As a patient it is your responsibility as well to monitor narcotic medication use and report the amount and frequency you use these medications when you come to your office visit.   We would also advise that if you are using narcotic medications, you should take a dose prior to therapy to maximize you participation.  IF YOU ARE ON NARCOTIC MEDICATIONS IT IS NOT PERMISSIBLE TO OPERATE A MOTOR VEHICLE (MOTORCYCLE/CAR/TRUCK/MOPED) OR HEAVY MACHINERY DO NOT MIX NARCOTICS WITH OTHER CNS (CENTRAL NERVOUS SYSTEM) DEPRESSANTS SUCH AS ALCOHOL   STOP SMOKING OR USING NICOTINE PRODUCTS!!!!  As discussed nicotine severely impairs your body's ability to heal surgical and traumatic wounds but also impairs  bone healing.  Wounds and bone heal by forming microscopic blood vessels (angiogenesis) and nicotine is a vasoconstrictor (essentially, shrinks blood vessels).  Therefore, if vasoconstriction occurs to these microscopic blood vessels they essentially disappear and are unable to deliver necessary nutrients to the healing tissue.  This is one modifiable factor that you can do to dramatically increase your chances of healing your injury.    (This means no smoking, no nicotine gum, patches, etc)  DO NOT USE NONSTEROIDAL ANTI-INFLAMMATORY DRUGS (NSAID'S)  Using products such as Advil (ibuprofen), Aleve (naproxen), Motrin (ibuprofen) for additional pain control during fracture healing can delay and/or prevent the healing response.  If you would like to take over the counter (OTC) medication, Tylenol (acetaminophen) is ok.  However, some narcotic medications that are given for pain control contain acetaminophen as well. Therefore, you should not exceed more than 4000 mg of tylenol in a day if you do not have liver disease.  Also note that there are may OTC medicines, such as cold medicines and allergy medicines that my contain tylenol as well.  If you have any questions about medications and/or interactions please ask your doctor/PA or your pharmacist.      ICE AND ELEVATE INJURED/OPERATIVE EXTREMITY  Using ice and elevating the injured extremity above your heart can help with swelling and pain control.  Icing in a pulsatile fashion, such as 20 minutes on and 20 minutes off, can be followed.    Do not place ice directly on skin. Make sure there is a barrier between to skin and the ice pack.    Using frozen items such as frozen peas works well  as the conform nicely to the are that needs to be iced.  USE AN ACE WRAP OR TED HOSE FOR SWELLING CONTROL  In addition to icing and elevation, Ace wraps or TED hose are used to help limit and resolve swelling.  It is recommended to use Ace wraps or TED hose until you are  informed to stop.    When using Ace Wraps start the wrapping distally (farthest away from the body) and wrap proximally (closer to the body)   Example: If you had surgery on your leg or thing and you do not have a splint on, start the ace wrap at the toes and work your way up to the thigh        If you had surgery on your upper extremity and do not have a splint on, start the ace wrap at your fingers and work your way up to the upper arm   [place]: [phone]   VISIT OUR WEBSITE FOR ADDITIONAL INFORMATION: [email or web address]    Discharge Wound Care Instructions  Do NOT apply any ointments, solutions or lotions to pin sites or surgical wounds.  These prevent needed drainage and even though solutions like hydrogen peroxide kill bacteria, they also damage cells lining the pin sites that help fight infection.  Applying lotions or ointments can keep the wounds moist and can cause them to breakdown and open up as well. This can increase the risk for infection. When in doubt call the office.  If any drainage is noted, use one layer of adaptic, then gauze, Kerlix, and an ace wrap.  Once the incision is completely dry and without drainage, it may be left open to air out.  Showering may begin 36-48 hours later.  Cleaning gently with soap and water.

## 2020-09-28 NOTE — Progress Notes (Signed)
Report given to [facility], all questions answered. Pt belongings gathered to be sent with her. Pt waiting on [facility] for transport.

## 2020-09-28 NOTE — TOC Transition Note (Signed)
Transition of Care [facility]) - CM/SW Discharge Note   Patient Details  Name: Julia [name] MRN: [id number] Date of Birth: [birth year]  [facility] CM/SW Contact:  Milinda Antis, [place] Phone Number: 09/28/2020, 1:31 PM   Clinical Narrative:    Patient will DC to:  [facility]  Anticipated DC date: 5.2.2022 Family notified: Yes Transport by:  Corey Harold   Per MD patient ready for DC to SNF . RN to call report prior to discharge [phone]. RN, patient, patient's family, and facility notified of DC. Discharge Summary and FL2 sent to facility. Patient will be in room 1008p.  DC packet on chart. Ambulance transport requested for patient.   CSW will sign off for now as social work intervention is no longer needed. Please consult [place] again if new needs arise.     Final next level of care: Skilled Nursing Facility Barriers to Discharge: Barriers Resolved   Patient Goals and CMS Choice Patient states their goals for this hospitalization and ongoing recovery are:: Patient has dementia CMS Medicare.gov Compare Post Acute Care list provided to:: Patient Represenative (must comment) (Patients adult daughter) Choice offered to / list presented to : Adult Children  Discharge Placement              Patient chooses bed at: [facility] Patient to be transferred to facility by: [facility] Name of family member notified: Idelle Leech   Daughter     [phone] Patient and family notified of of transfer: 09/28/20  Discharge Plan and Services                                     Social Determinants of Health (SDOH) Interventions     Readmission Risk Interventions No flowsheet data found.

## 2020-09-28 NOTE — Progress Notes (Signed)
Occupational Therapy Treatment Patient Details Name: Julia [name] MRN: [id number] DOB: 1935-04-13 Today's Date: 09/28/2020    History of present illness 86 yo female s/p ORIF R distal femur fracture due to fall at home sustaining R periprosthetic femur fx. PMH includes CKD 3, hypothyroidism, HTN, Alzheimer's dementia, fall with R hip fracture and repair 2018, osteopenia.   OT comments  Pt progressing towards OT goals. Pt able to tolerate sitting unsupported for longer periods of time to complete ADL's with supervision. Pt requires min verbal cueing for initiation of tasks, then is able to attend to them through completion. Pt completed 3 sit<>stands this session with Mod Assist +2 for safety, Pt attempting to take steps, able to step with RLE, however having difficulty placing full weight on RLE to take a step forward with LLE. Pt is a great candidate for SNF and will continue to make progress as she works on mobility and pain lessens. OT will continue to follow and address functional mobility and seated ADL's.    Follow Up Recommendations  SNF    Equipment Recommendations  None recommended by OT    Recommendations for Other Services      Precautions / Restrictions Precautions Precautions: Fall Restrictions Weight Bearing Restrictions: Yes RLE Weight Bearing: Weight bearing as tolerated       Mobility Bed Mobility Overal bed mobility: Needs Assistance Bed Mobility: Sit to Supine       Sit to supine: Max assist;+2 for physical assistance   General bed mobility comments: Up in chair on arrival, OT and tech put pt back in bed, completeing helicopter manuever to assist in less pain for pt during transfer.    Transfers Overall transfer level: Needs assistance Equipment used: Rolling walker (2 wheeled) Transfers: Sit to/from [facility] Sit to Stand: +2 safety/equipment;Mod assist Stand pivot transfers: Mod assist;+2 physical assistance;+2 safety/equipment        General transfer comment: Pt very anxious with standing and fearful of falling. performed STS from recliner 2x with cues for hand placement and postural control. Tactile input for upright posture at sternum and sacrum. Able to stand for ~1 min before sitting. Attempted marching in place, however unable to fully pick up either LLE.    Balance Overall balance assessment: Needs assistance;History of Falls Sitting-balance support: No upper extremity supported;Feet supported Sitting balance-Leahy Scale: Fair Sitting balance - Comments: able to sit EOB without OT support after gaining initial balance   Standing balance support: Bilateral upper extremity supported;During functional activity Standing balance-Leahy Scale: Poor Standing balance comment: Standing tolerance x1 minute before fatiguing                           ADL either performed or assessed with clinical judgement   ADL Overall ADL's : Needs assistance/impaired Eating/Feeding: Set up;Sitting Eating/Feeding Details (indicate cue type and reason): Pt able to bring a drink to her mouth and take a sip.                     Toilet Transfer: Maximal assistance;+2 for physical assistance;+2 for safety/equipment;Stand-pivot Toilet Transfer Details (indicate cue type and reason): Simulated transferring to bed from recliner. HHA, with OT and tech on either side assisting with pivot once pt came to standing.         Functional mobility during ADLs: Maximal assistance;+2 for safety/equipment;+2 for physical assistance General ADL Comments: OT and daughter provided verbal encouragement for intiation throughout session. Pt anxious  about falling and having difficulty mobilizing LLE due to pain with weightbearing on RLE.     Vision       Perception     Praxis      Cognition Arousal/Alertness: Awake/alert Behavior During Therapy: WFL for tasks assessed/performed;Anxious Overall Cognitive Status: History of  cognitive impairments - at baseline                                 General Comments: history of dementia; pt oriented to self only, very limited by anxiety during mobility.        Exercises     Shoulder Instructions       General Comments VSS on RA    Pertinent Vitals/ Pain       Pain Assessment: Faces Faces Pain Scale: Hurts even more Pain Location: R hip, knee Pain Descriptors / Indicators: Discomfort;Grimacing;Guarding;Moaning Pain Intervention(s): Limited activity within patient's tolerance;Monitored during session;Repositioned  Home Living                                          Prior Functioning/Environment              Frequency  Min 2X/week        Progress Toward Goals  OT Goals(current goals can now be found in the care plan section)  Progress towards OT goals: Progressing toward goals  Acute Rehab OT Goals Patient Stated Goal: feel better OT Goal Formulation: With patient/family Time For Goal Achievement: 10/08/20 Potential to Achieve Goals: Good ADL Goals Pt Will Perform Grooming: with supervision;sitting Pt Will Perform Lower Body Dressing: with min assist;sitting/lateral leans Pt Will Transfer to Toilet: with mod assist;stand pivot transfer;bedside commode Additional ADL Goal #1: Pt able to tolerate sitting EOB for 5 mins with no support for seated basic ADL's  Plan Discharge plan remains appropriate;Frequency remains appropriate    Co-evaluation                 AM-PAC OT "6 Clicks" Daily Activity     Outcome Measure   Help from another person eating meals?: A Little Help from another person taking care of personal grooming?: A Little Help from another person toileting, which includes using toliet, bedpan, or urinal?: A Lot Help from another person bathing (including washing, rinsing, drying)?: A Lot Help from another person to put on and taking off regular upper body clothing?: A Little Help from  another person to put on and taking off regular lower body clothing?: Total 6 Click Score: 14    End of Session Equipment Utilized During Treatment: Gait belt;Rolling walker  OT Visit Diagnosis: Unsteadiness on feet (R26.81);Repeated falls (R29.6);Muscle weakness (generalized) (M62.81);History of falling (Z91.81)   Activity Tolerance Patient limited by pain   Patient Left in bed;with call bell/phone within reach;with family/visitor present   Nurse Communication Mobility status        Time: 0935-1000 OT Time Calculation (min): 25 min  Charges: OT General Charges $OT Visit: 1 Visit OT Treatments $Self Care/Home Management : 23-37 mins  Nickalaus Crooke H., OTR/L Acute Rehabilitation  [place] 09/28/2020, 10:38 AM

## 2020-09-29 DIAGNOSIS — D649 Anemia, unspecified: Secondary | ICD-10-CM | POA: Diagnosis not present

## 2020-09-29 DIAGNOSIS — E440 Moderate protein-calorie malnutrition: Secondary | ICD-10-CM | POA: Diagnosis not present

## 2020-09-29 DIAGNOSIS — I150 Renovascular hypertension: Secondary | ICD-10-CM | POA: Diagnosis not present

## 2020-09-29 DIAGNOSIS — M9701XA Periprosthetic fracture around internal prosthetic right hip joint, initial encounter: Secondary | ICD-10-CM | POA: Diagnosis not present

## 2020-10-01 DIAGNOSIS — R296 Repeated falls: Secondary | ICD-10-CM | POA: Diagnosis not present

## 2020-10-01 DIAGNOSIS — E440 Moderate protein-calorie malnutrition: Secondary | ICD-10-CM | POA: Diagnosis not present

## 2020-10-01 DIAGNOSIS — N183 Chronic kidney disease, stage 3 unspecified: Secondary | ICD-10-CM | POA: Diagnosis not present

## 2020-10-01 DIAGNOSIS — E039 Hypothyroidism, unspecified: Secondary | ICD-10-CM | POA: Diagnosis not present

## 2020-10-01 DIAGNOSIS — E782 Mixed hyperlipidemia: Secondary | ICD-10-CM | POA: Diagnosis not present

## 2020-10-01 DIAGNOSIS — M6281 Muscle weakness (generalized): Secondary | ICD-10-CM | POA: Diagnosis not present

## 2020-10-01 DIAGNOSIS — K219 Gastro-esophageal reflux disease without esophagitis: Secondary | ICD-10-CM | POA: Diagnosis not present

## 2020-10-01 DIAGNOSIS — M80051D Age-related osteoporosis with current pathological fracture, right femur, subsequent encounter for fracture with routine healing: Secondary | ICD-10-CM | POA: Diagnosis not present

## 2020-10-01 DIAGNOSIS — S72301D Unspecified fracture of shaft of right femur, subsequent encounter for closed fracture with routine healing: Secondary | ICD-10-CM | POA: Diagnosis not present

## 2020-10-01 DIAGNOSIS — R2681 Unsteadiness on feet: Secondary | ICD-10-CM | POA: Diagnosis not present

## 2020-10-01 DIAGNOSIS — I10 Essential (primary) hypertension: Secondary | ICD-10-CM | POA: Diagnosis not present

## 2020-10-02 ENCOUNTER — Non-Acute Institutional Stay: Payer: Medicare Other | Admitting: Student

## 2020-10-02 ENCOUNTER — Other Ambulatory Visit: Payer: Self-pay

## 2020-10-02 DIAGNOSIS — R52 Pain, unspecified: Secondary | ICD-10-CM | POA: Diagnosis not present

## 2020-10-02 DIAGNOSIS — E440 Moderate protein-calorie malnutrition: Secondary | ICD-10-CM

## 2020-10-02 DIAGNOSIS — S72044D Nondisplaced fracture of base of neck of right femur, subsequent encounter for closed fracture with routine healing: Secondary | ICD-10-CM | POA: Diagnosis not present

## 2020-10-02 DIAGNOSIS — Z515 Encounter for palliative care: Secondary | ICD-10-CM

## 2020-10-02 DIAGNOSIS — G301 Alzheimer's disease with late onset: Secondary | ICD-10-CM

## 2020-10-02 DIAGNOSIS — F0281 Dementia in other diseases classified elsewhere with behavioral disturbance: Secondary | ICD-10-CM

## 2020-10-02 DIAGNOSIS — R531 Weakness: Secondary | ICD-10-CM | POA: Diagnosis not present

## 2020-10-02 NOTE — Progress Notes (Signed)
[facility] Consult Note Telephone: [phone]  Fax: [phone]    Date of encounter: 10/02/20 PATIENT NAME: Julia [name] [address] [place] [address]   [phone] (home)  DOB: [birth year] MRN: [id number] PRIMARY CARE PROVIDER:    Dr. Keenan Bachelor  REFERRING PROVIDER:   Dr. Keenan Bachelor  RESPONSIBLE PARTY:    Contact Information    Name Relation Home Work Mobile   Clayborn,Chester L Spouse [phone]     Idelle Leech Daughter   [phone]   Neale Burly Daughter   [phone]       I met face to face with patient and family in the facility. Palliative Care was asked to follow this patient by consultation request of  Dr. Keenan Bachelor to address advance care planning and complex medical decision making. This is the initial visit.                                     ASSESSMENT AND PLAN / RECOMMENDATIONS:   Advance Care Planning/Goals of Care: Goals include to maximize quality of life and symptom management. Patient to complete therapy, then return home with husband. Our advance care planning conversation included a discussion about:     The value and importance of advance care planning   Experiences with loved ones who have been seriously ill or have died   Exploration of personal, cultural or spiritual beliefs that might influence medical decisions   Exploration of goals of care in the event of a sudden injury or illness   Identification and preparation of a healthcare agent, daughter Kenney Houseman has durable POA  MOST form in place.   CODE STATUS: Full Code   Education provided on Palliative Medicine vs. Hospice services. Ongoing evaluation for changes/declines.  Symptom Management/Plan:  Pain-patient with pain secondary to right femur fracture. Recommend continuing acetaminophen 1000 mg TID, tramadol 50 mg every 12 hours PRN x 14 days, through 10/12/20.   Weakness secondary to right femur fracture-continue PT/OT as  directed. Staff to assist with adl's. Monitor for falls/safety.  Alzheimer's dementia-FAST score 6C. Continue memantine ER 31m daily, mirtazapine 160mQHS, quetiapine 2559mHS. Provide cueing/redirection/reorient as needed. Protein calorie malnutrition secondary to dementia. Albumin 3.2 on 09/23/20. Encourage soft foods, foods that patient enjoys; yogurt added to all meals. Monitor weights.    Follow up Palliative Care Visit: Palliative care will continue to follow for complex medical decision making, advance care planning, and clarification of goals. Return in 2  weeks or prn.  I spent 45 minutes providing this consultation. More than 50% of the time in this consultation was spent in counseling and care coordination.   PPS: 40%  HOSPICE ELIGIBILITY/DIAGNOSIS: TBD  Chief Complaint: Palliative Medicine initial consult.   HISTORY OF PRESENT ILLNESS:  Julia [name] a 85 92o. year old female  with right femur fracture. Diagnoses also include alzheimer's dementia, protein calorie-malnutrition, essential hypertension, CKD 3 hypothyroidism, GERD. Patient fell on 4/26, sustained closed right distal femur fracture; repair on 4/27. Patient discharged to [facility] 09/28/20. Patient had been performing adl's independently prior to fall.   Patient receiving OT/PT. Requires assistance with all adl's. Daughter TonKenney Housemanates patient was experiencing pain, but would not call for help/alert staff. Pain medications changed to routinely. She does have prn tramadol for moderate/severe pain. Patient with poor to fair appetite; she will snack throughout the day and eat  soft foods. Patient reports sleeping well. She is eager to return home with her husband.   History obtained from review of EMR, discussion with primary team, and interview with family, facility staff/caregiver and/or Ms. Jerry.  I reviewed available labs, medications, imaging, studies and related documents from the EMR.  Records  reviewed and summarized above.   ROS   General: NAD EYES: denies vision changes ENMT: denies dysphagia Cardiovascular: denies chest pain Pulmonary: denies cough, denies increased SOB Abdomen: endorses poor to fair appetite, denies constipation, endorses continence of bowel GU: denies dysuria MSK: weakness, no falls reported since admission Skin: denies rashes or wounds Neurological: denies insomnia Psych: Endorses positive mood Heme/lymph/immuno: denies bruises, abnormal bleeding  Physical Exam: Weight: 129.8 pounds Pulse 84, resp 16, b/p 118/62, sats 97% on room air Constitutional: NAD General: frail appearing, thin  EYES: anicteric sclera, lids intact, no discharge  ENMT: intact hearing, oral mucous membranes moist CV: S1S2, RRR, 1+ LE edema Pulmonary: LCTA, no increased work of breathing, no cough, room air Abdomen: normo-active BS + 4 quadrants, soft and non tender GU: deferred MSK:  moves all extremities, non-ambulatory Skin: warm and dry, surgical wound to right lateral thigh; dressing CDI, mild swelling Neuro: generalized weakness Psych: non-anxious affect, A & O x 2, forgetful Hem/lymph/immuno: no widespread bruising   CURRENT PROBLEM LIST:  Patient Active Problem List   Diagnosis Date Noted  . Malnutrition of moderate degree 09/27/2020  . Closed fracture of shaft of right femur ([place]) 09/22/2020  . CKD (chronic kidney disease) stage 3, GFR 30-59 ml/min (HCC) 09/22/2020  . Closed intertrochanteric fracture of hip, right, initial encounter ([place]) 03/27/2017  . Mild dementia ([place]) 03/27/2017  . Leukocytosis 03/27/2017  . Closed fracture of right hip ([place])   . Closed nondisplaced fracture of base of neck of right femur ([place]) 11/07/2016  . Sinus bradycardia 09/05/2014  . Junctional escape rhythm 09/05/2014  . Alzheimer's dementia ([place]) 04/21/2014  . Overactive bladder 04/21/2014  . Hypothyroidism, acquired 11/21/2013  . PALPITATIONS, RECURRENT 12/09/2008  .  Hyperlipidemia 04/18/2007  . Essential hypertension 12/11/2006  . GERD 12/11/2006  . Osteopenia 12/11/2006  . History of colonic polyps 12/11/2006   PAST MEDICAL HISTORY:  Active Ambulatory Problems    Diagnosis Date Noted  . Hyperlipidemia 04/18/2007  . Essential hypertension 12/11/2006  . GERD 12/11/2006  . Osteopenia 12/11/2006  . PALPITATIONS, RECURRENT 12/09/2008  . History of colonic polyps 12/11/2006  . Hypothyroidism, acquired 11/21/2013  . Alzheimer's dementia ([place]) 04/21/2014  . Overactive bladder 04/21/2014  . Sinus bradycardia 09/05/2014  . Junctional escape rhythm 09/05/2014  . Closed nondisplaced fracture of base of neck of right femur ([facility]) 11/07/2016  . Closed fracture of right hip ([place])   . Closed intertrochanteric fracture of hip, right, initial encounter ([place]) 03/27/2017  . Mild dementia ([place]) 03/27/2017  . Leukocytosis 03/27/2017  . Closed fracture of shaft of right femur ([place]) 09/22/2020  . CKD (chronic kidney disease) stage 3, GFR 30-59 ml/min (HCC) 09/22/2020  . Malnutrition of moderate degree 09/27/2020   Resolved Ambulatory Problems    Diagnosis Date Noted  . URI 11/03/2009  . SEBACEOUS CYST, INFECTED 03/16/2009  . Other malaise and fatigue 11/20/2007  . Abdominal pain, epigastric 02/11/2008  . Concussion with no loss of consciousness 05/14/2008  . UNS ADVRS EFF UNS RX MEDICINAL&BIOLOGICAL SBSTNC 11/20/2007  . Headache around the eyes 07/19/2010  . Preventative health care 07/19/2010  . Unspecified constipation 02/28/2012  . Tingling in extremities 07/08/2014  . Dizziness 09/05/2014  .  SOB (shortness of breath) 09/05/2014  . Generalized anxiety disorder 02/10/2015   Past Medical History:  Diagnosis Date  . Anxiety   . Dementia ([place])   . GERD (gastroesophageal reflux disease)   . Hx of echocardiogram   . Hypertension   . Hypothyroidism   . Right forearm fracture 2011  . Thyroid disease    SOCIAL HX:  Social History   Tobacco Use  .  Smoking status: Never Smoker  . Smokeless tobacco: Never Used  Substance Use Topics  . Alcohol use: No    Alcohol/week: 0.0 standard drinks   FAMILY HX:  Family History  Problem Relation Age of Onset  . Hypertension Mother   . COPD Mother   . Tuberculosis Mother        x 2  . Lung cancer Father   . Stomach cancer Father   . Heart disease Brother   . Drug abuse Maternal Uncle   . Cancer Maternal Uncle   . Alzheimer's disease Maternal Aunt   . Colon cancer Neg Hx   . Esophageal cancer Neg Hx   . Rectal cancer Neg Hx   . Heart attack Neg Hx   . Stroke Neg Hx       ALLERGIES:  Allergies  Allergen Reactions  . Entex Other (See Comments)    Reaction unknown   . Sulfonamide Derivatives Other (See Comments)    Reaction is not known- allergy is from years ago     PERTINENT MEDICATIONS:  Outpatient Encounter Medications as of 10/02/2020  Medication Sig  . acetaminophen (TYLENOL) 325 MG tablet Take 2 tablets (650 mg total) by mouth every 6 (six) hours as needed for mild pain.  Marland Kitchen amLODipine (NORVASC) 10 MG tablet Take 1 tablet (10 mg total) by mouth daily.  Marland Kitchen Besifloxacin HCl 0.6 % SUSP Place 1 drop into both eyes at bedtime.  . cholecalciferol (VITAMIN D) 25 MCG tablet Take 2 tablets (2,000 Units total) by mouth daily.  Marland Kitchen docusate sodium (COLACE) 100 MG capsule Take 1 capsule (100 mg total) by mouth 2 (two) times daily.  Marland Kitchen enoxaparin (LOVENOX) 40 MG/0.4ML injection Inject 0.4 mLs (40 mg total) into the skin daily for 28 days.  Marland Kitchen galantamine (RAZADYNE ER) 24 MG 24 hr capsule TAKE 1 CAPSULE(24 MG) BY MOUTH DAILY (Patient taking differently: Take 24 mg by mouth in the morning.)  . latanoprost (XALATAN) 0.005 % ophthalmic solution Place 1 drop into both eyes at bedtime. (Patient not taking: Reported on 09/22/2020)  . levothyroxine (SYNTHROID) 88 MCG tablet Take 1 tablet (88 mcg total) by mouth daily before breakfast.  . memantine (NAMENDA XR) 28 MG CP24 24 hr capsule TAKE 1 CAPSULE(28 MG)  BY MOUTH DAILY (Patient taking differently: Take 28 mg by mouth at bedtime.)  . mirtazapine (REMERON) 15 MG tablet TAKE 1 TABLET BY MOUTH AT BEDTIME (Patient taking differently: Take 15 mg by mouth at bedtime.)  . Multiple Vitamins-Minerals (CENTRUM SILVER PO) Take 1 tablet by mouth daily.  . polyethylene glycol (MIRALAX / GLYCOLAX) packet Take 17 g by mouth daily as needed for mild constipation. Reported on 10/17/2015  . QUEtiapine (SEROQUEL) 25 MG tablet TAKE 1 TABLET BY MOUTH AT BEDTIME (Patient taking differently: Take 25 mg by mouth at bedtime.)  . traMADol (ULTRAM) 50 MG tablet Take 1 tablet (50 mg total) by mouth every 12 (twelve) hours as needed for moderate pain or severe pain.   No facility-administered encounter medications on file as of 10/02/2020.    Thank  you for the opportunity to participate in the care of Ms. Schepp.  The palliative care team will continue to follow. Please call our office at [phone] if we can be of additional assistance.   Ezekiel Slocumb, NP     COVID-19 PATIENT SCREENING TOOL Asked and negative response unless otherwise noted:   Have you had symptoms of covid, tested positive or been in contact with someone with symptoms/positive test in the past 5-10 days? No

## 2020-10-06 DIAGNOSIS — S72401D Unspecified fracture of lower end of right femur, subsequent encounter for closed fracture with routine healing: Secondary | ICD-10-CM | POA: Diagnosis not present

## 2020-10-08 DIAGNOSIS — S72001D Fracture of unspecified part of neck of right femur, subsequent encounter for closed fracture with routine healing: Secondary | ICD-10-CM | POA: Diagnosis not present

## 2020-10-08 DIAGNOSIS — R262 Difficulty in walking, not elsewhere classified: Secondary | ICD-10-CM | POA: Diagnosis not present

## 2020-10-08 DIAGNOSIS — I129 Hypertensive chronic kidney disease with stage 1 through stage 4 chronic kidney disease, or unspecified chronic kidney disease: Secondary | ICD-10-CM | POA: Diagnosis not present

## 2020-10-08 DIAGNOSIS — E440 Moderate protein-calorie malnutrition: Secondary | ICD-10-CM | POA: Diagnosis not present

## 2020-10-13 DIAGNOSIS — R600 Localized edema: Secondary | ICD-10-CM | POA: Diagnosis not present

## 2020-10-13 DIAGNOSIS — I150 Renovascular hypertension: Secondary | ICD-10-CM | POA: Diagnosis not present

## 2020-10-13 DIAGNOSIS — D649 Anemia, unspecified: Secondary | ICD-10-CM | POA: Diagnosis not present

## 2020-10-13 DIAGNOSIS — S72001D Fracture of unspecified part of neck of right femur, subsequent encounter for closed fracture with routine healing: Secondary | ICD-10-CM | POA: Diagnosis not present

## 2020-10-15 ENCOUNTER — Other Ambulatory Visit: Payer: Self-pay

## 2020-10-15 ENCOUNTER — Non-Acute Institutional Stay: Payer: Medicare Other | Admitting: Student

## 2020-10-15 DIAGNOSIS — Z515 Encounter for palliative care: Secondary | ICD-10-CM | POA: Diagnosis not present

## 2020-10-15 DIAGNOSIS — R52 Pain, unspecified: Secondary | ICD-10-CM | POA: Diagnosis not present

## 2020-10-15 DIAGNOSIS — G301 Alzheimer's disease with late onset: Secondary | ICD-10-CM | POA: Diagnosis not present

## 2020-10-15 DIAGNOSIS — E440 Moderate protein-calorie malnutrition: Secondary | ICD-10-CM | POA: Diagnosis not present

## 2020-10-15 DIAGNOSIS — S72001D Fracture of unspecified part of neck of right femur, subsequent encounter for closed fracture with routine healing: Secondary | ICD-10-CM | POA: Diagnosis not present

## 2020-10-15 DIAGNOSIS — R600 Localized edema: Secondary | ICD-10-CM | POA: Diagnosis not present

## 2020-10-15 DIAGNOSIS — Z96649 Presence of unspecified artificial hip joint: Secondary | ICD-10-CM | POA: Diagnosis not present

## 2020-10-15 DIAGNOSIS — M978XXD Periprosthetic fracture around other internal prosthetic joint, subsequent encounter: Secondary | ICD-10-CM | POA: Diagnosis not present

## 2020-10-15 NOTE — Progress Notes (Signed)
[facility] Consult Note Telephone: [phone]  Fax: [phone]    Date of encounter: 10/15/20 PATIENT NAME: Julia [name] [id number]   [phone] (home)  DOB: March 21, 1935 MRN: [id number] PRIMARY CARE PROVIDER:    Dorothyann Peng, NP,  [place] [address] [phone]  REFERRING PROVIDER:   Dr. Keenan Bachelor  RESPONSIBLE PARTY:    Contact Information    Name Relation Home Work Mobile   Wahlberg,Chester L Spouse [phone]     Idelle Leech Daughter   [phone]   Neale Burly Daughter   [phone]       I met face to face with patient and family in the home. Palliative Care was asked to follow this patient by consultation request of Dr. Keenan Bachelor  to address advance care planning and complex medical decision making. This is a follow up visit.                                   ASSESSMENT AND PLAN / RECOMMENDATIONS:   Advance Care Planning/Goals of Care: Goals include to maximize quality of life and symptom management. Our advance care planning conversation included a discussion about:     The value and importance of advance care planning   Experiences with loved ones who have been seriously ill or have died   Exploration of personal, cultural or spiritual beliefs that might influence medical decisions   Exploration of goals of care in the event of a sudden injury or illness   Identification and preparation of a healthcare agent   Review and updating or creation of an  advance directive document .  CODE STATUS: Full Code  Discussed Palliative Medicine vs. Hospice services. Will assess ongoing for changes and declines. Plan for Palliative Medicine to continue to follow when she returns home.   Symptom Management/Plan:  Pain-patient with pain secondary to right femur fracture. Recommend continuing acetaminophen 1000 mg TID, tramadol 50 mg every 12 hours PRN.  Recommend starting Lidocaine patch 4% apply to affected area on 12 hours/off 12 hours.   Weakness secondary to right femur fracture-continue PT/OT as directed. Staff to assist with adl's. Monitor for falls/safety. Recommend HHPT upon discharge.  Alzheimer's dementia-FAST score 6C. Continue memantine ER 66m daily, mirtazapine 146mQHS, quetiapine 2580mHS. Provide cueing/redirection/reorient as needed. Protein calorie malnutrition secondary to dementia. Albumin 3.2 on 09/23/20. Encourage soft foods, foods that patient enjoys; offer yogurt with meals. Monitor weights.   Daughter is interested in Meals in the home; will provide some recommendations.  Follow up Palliative Care Visit: Palliative care will continue to follow for complex medical decision making, advance care planning, and clarification of goals. Return in 4 weeks or prn.  I spent 35 minutes providing this consultation. More than 50% of the time in this consultation was spent in counseling and care coordination.   PPS: 40%  HOSPICE ELIGIBILITY/DIAGNOSIS: TBD  Chief Complaint: Palliative Medicine follow up visit.   HISTORY OF PRESENT ILLNESS:  Julia [name] a 85 55o. year old female  with  . alzheimer's dementia, protein calorie-malnutrition, essential hypertension, CKD 3,  hypothyroidism, GERD. Patient fell on 4/26, sustained closed right distal femur fracture; repair on 4/27. Patient discharged to [facility] 09/28/20. Patient had been performing adl's independently prior to fall.   Patient will be discharging home on 10/19/20. Request HHPT to continue. Patient  denies pain at rest; does endorse pain with movement. She is receiving acetaminophen and her tramadol had been discontinued after 14 days, but staff has reinstated order. Appetite continues to be poor to fair. [name] notes that patient requires more cueing/redirection. She will not willing eat and needs encouragement.     History obtained from review of  EMR, discussion with primary team, and interview with family, facility staff/caregiver and/or Ms. Lukins.  I reviewed available labs, medications, imaging, studies and related documents from the EMR.  Records reviewed and summarized above.   ROS  General: NAD EYES: denies vision changes ENMT: denies dysphagia Cardiovascular: denies chest pain Pulmonary: denies cough, denies increased SOB Abdomen: denies constipation, endorses continence of bowel GU: denies dysuria MSK: weakness, no falls reported since admission Skin: denies rashes or wounds Neurological: denies pain, denies insomnia Psych: Endorses stable mood Heme/lymph/immuno: denies bruises, abnormal bleeding  Physical Exam: Weight: 127.8 pounds 10/08/20. Constitutional: NAD General: frail appearing, thin EYES: anicteric sclera, lids intact, no discharge  ENMT: intact hearing, oral mucous membranes moist CV: S1S2, RRR, trace LE edema Pulmonary: LCTA, no increased work of breathing, no cough, room air Abdomen: normo-active BS + 4 quadrants, soft and non tender GU: deferred MSK: moves all extremities, ambulatory with walker Skin: warm and dry, no rashes or wounds on visible skin Neuro: generalized weakness Psych: non-anxious affect, A & O x 2, forgetful Hem/lymph/immuno: no widespread bruising   Thank you for the opportunity to participate in the care of Julia [name].  The palliative care team will continue to follow. Please call our office at [phone] if we can be of additional assistance.   Ezekiel Slocumb, NP   COVID-19 PATIENT SCREENING TOOL Asked and negative response unless otherwise noted:   Have you had symptoms of covid, tested positive or been in contact with someone with symptoms/positive test in the past 5-10 days? No

## 2020-10-16 DIAGNOSIS — G308 Other Alzheimer's disease: Secondary | ICD-10-CM | POA: Diagnosis not present

## 2020-10-16 DIAGNOSIS — S72001D Fracture of unspecified part of neck of right femur, subsequent encounter for closed fracture with routine healing: Secondary | ICD-10-CM | POA: Diagnosis not present

## 2020-10-16 DIAGNOSIS — E038 Other specified hypothyroidism: Secondary | ICD-10-CM | POA: Diagnosis not present

## 2020-10-16 DIAGNOSIS — I129 Hypertensive chronic kidney disease with stage 1 through stage 4 chronic kidney disease, or unspecified chronic kidney disease: Secondary | ICD-10-CM | POA: Diagnosis not present

## 2020-10-16 DIAGNOSIS — D6489 Other specified anemias: Secondary | ICD-10-CM | POA: Diagnosis not present

## 2020-10-19 ENCOUNTER — Telehealth: Payer: Self-pay

## 2020-10-19 NOTE — Telephone Encounter (Signed)
(  2:57p)  SW completed a Mobile Meals referral for patient per request of her daughter.

## 2020-10-21 ENCOUNTER — Telehealth: Payer: Self-pay | Admitting: Adult Health

## 2020-10-21 NOTE — Telephone Encounter (Signed)
Julia [name] is calling and stated that [facility] and Re[facility] put in orders for patient to be elevated for PT and OT and caller wanted to see if provider would sign orders after they open up services, please advise. CB is [phone]

## 2020-10-21 NOTE — Telephone Encounter (Signed)
That's fine

## 2020-10-22 ENCOUNTER — Telehealth: Payer: Self-pay | Admitting: Nurse Practitioner

## 2020-10-22 NOTE — Telephone Encounter (Signed)
Spoke with patient's daughter [name], and have scheduled a Palliative Telehealth f/u visit (post facility discharge) for 11/04/20 @ 9 AM.

## 2020-10-23 ENCOUNTER — Telehealth (INDEPENDENT_AMBULATORY_CARE_PROVIDER_SITE_OTHER): Payer: Medicare Other | Admitting: Adult Health

## 2020-10-23 ENCOUNTER — Encounter: Payer: Self-pay | Admitting: Adult Health

## 2020-10-23 VITALS — BP 120/64 | HR 66 | Resp 18

## 2020-10-23 DIAGNOSIS — E039 Hypothyroidism, unspecified: Secondary | ICD-10-CM | POA: Diagnosis not present

## 2020-10-23 DIAGNOSIS — Z7901 Long term (current) use of anticoagulants: Secondary | ICD-10-CM | POA: Diagnosis not present

## 2020-10-23 DIAGNOSIS — S72391D Other fracture of shaft of right femur, subsequent encounter for closed fracture with routine healing: Secondary | ICD-10-CM

## 2020-10-23 DIAGNOSIS — Z4789 Encounter for other orthopedic aftercare: Secondary | ICD-10-CM | POA: Diagnosis not present

## 2020-10-23 DIAGNOSIS — E785 Hyperlipidemia, unspecified: Secondary | ICD-10-CM | POA: Diagnosis not present

## 2020-10-23 DIAGNOSIS — N183 Chronic kidney disease, stage 3 unspecified: Secondary | ICD-10-CM | POA: Diagnosis not present

## 2020-10-23 DIAGNOSIS — S72391S Other fracture of shaft of right femur, sequela: Secondary | ICD-10-CM

## 2020-10-23 DIAGNOSIS — H409 Unspecified glaucoma: Secondary | ICD-10-CM | POA: Diagnosis not present

## 2020-10-23 DIAGNOSIS — I129 Hypertensive chronic kidney disease with stage 1 through stage 4 chronic kidney disease, or unspecified chronic kidney disease: Secondary | ICD-10-CM | POA: Diagnosis not present

## 2020-10-23 DIAGNOSIS — G309 Alzheimer's disease, unspecified: Secondary | ICD-10-CM | POA: Diagnosis not present

## 2020-10-23 DIAGNOSIS — M8589 Other specified disorders of bone density and structure, multiple sites: Secondary | ICD-10-CM | POA: Diagnosis not present

## 2020-10-23 DIAGNOSIS — I10 Essential (primary) hypertension: Secondary | ICD-10-CM | POA: Diagnosis not present

## 2020-10-23 DIAGNOSIS — R1310 Dysphagia, unspecified: Secondary | ICD-10-CM | POA: Diagnosis not present

## 2020-10-23 DIAGNOSIS — F0280 Dementia in other diseases classified elsewhere without behavioral disturbance: Secondary | ICD-10-CM | POA: Diagnosis not present

## 2020-10-23 DIAGNOSIS — K219 Gastro-esophageal reflux disease without esophagitis: Secondary | ICD-10-CM | POA: Diagnosis not present

## 2020-10-23 DIAGNOSIS — Z86010 Personal history of colonic polyps: Secondary | ICD-10-CM | POA: Diagnosis not present

## 2020-10-23 DIAGNOSIS — S72401D Unspecified fracture of lower end of right femur, subsequent encounter for closed fracture with routine healing: Secondary | ICD-10-CM | POA: Diagnosis not present

## 2020-10-23 DIAGNOSIS — E440 Moderate protein-calorie malnutrition: Secondary | ICD-10-CM | POA: Diagnosis not present

## 2020-10-23 DIAGNOSIS — D631 Anemia in chronic kidney disease: Secondary | ICD-10-CM | POA: Diagnosis not present

## 2020-10-23 DIAGNOSIS — Z9181 History of falling: Secondary | ICD-10-CM | POA: Diagnosis not present

## 2020-10-23 DIAGNOSIS — M9701XD Periprosthetic fracture around internal prosthetic right hip joint, subsequent encounter: Secondary | ICD-10-CM | POA: Diagnosis not present

## 2020-10-23 DIAGNOSIS — I150 Renovascular hypertension: Secondary | ICD-10-CM | POA: Diagnosis not present

## 2020-10-23 NOTE — Telephone Encounter (Signed)
Verbal orders given to [facility] for PT and OT.

## 2020-10-23 NOTE — Progress Notes (Signed)
Virtual Visit via Video Note  I connected with Julia [name] on 10/23/20 at  8:30 AM EDT by a video enabled telemedicine application and verified that I am speaking with the correct person using two identifiers.  Location patient: home Location provider:work or home office Persons participating in the virtual visit: patient, provider, daughter  I discussed the limitations of evaluation and management by telemedicine and the availability of in person appointments. The patient expressed understanding and agreed to proceed.   HPI: 86 year old female who  has a past medical history of Alzheimer's dementia ([place]), Anxiety, Concussion with no loss of consciousness (05/14/2008), Dementia ([place]), GERD (gastroesophageal reflux disease), History of colonic polyps, echocardiogram, Hyperlipidemia, Hypertension, Hypothyroidism, Osteopenia, Right forearm fracture (2011), and Thyroid disease.  She was admitted to the hospital on 09/22/2020 for closed fracture of the right femur underwent open reduction/internal fixation.  She then spent 21 days in [facility] rehab facility.  Came home at the beginning of this week.    Her daughter reports today that overall she seems to be doing fairly well.  There has been some decrease in her cognitive function since the fall/surgery.  Since being home this has improved marginally.  Family reports that MMSE was done at the facility before the patient left and her score was 6 out of 22  PT is coming today further initial evaluation and OT/speech therapy will also be doing that home visits.  She is using her walker whenever she is ambulating, and is only taking Tylenol for pain management.  Family reports that her wound is well-healed  Family stayed with her throughout the week, last night family gave her a trial of staying at home with her husband and reports that she did well.  There are cameras throughout the house that are 2 way cameras and family can keep in contact  with her this way.  ROS: See pertinent positives and negatives per HPI.  Past Medical History:  Diagnosis Date  . Alzheimer's dementia ([place])   . Anxiety   . Concussion with no loss of consciousness 05/14/2008   Qualifier: Diagnosis of  By: Arnoldo Morale MD, Balinda Quails   . Dementia [facility])   . GERD (gastroesophageal reflux disease)   . History of colonic polyps   . Hx of echocardiogram    Echo 4/16:  EF 55-60%, mild LVH  . Hyperlipidemia   . Hypertension   . Hypothyroidism   . Osteopenia   . Right forearm fracture 2011  . Thyroid disease    hypothyroidism    Past Surgical History:  Procedure Laterality Date  . ABDOMINAL HYSTERECTOMY  2009  . BILATERAL SALPINGOOPHORECTOMY    . bladder tack    . CATARACT EXTRACTION Right at least a year ago  . FEMUR IM NAIL Right 03/27/2017   Procedure: INTRAMEDULLARY (IM) NAIL FEMORAL;  Surgeon: Renette Butters, MD;  Location: [place];  Service: Orthopedics;  Laterality: Right;  . HARDWARE REMOVAL Right 09/23/2020   Procedure: HARDWARE REMOVAL;  Surgeon: Shona Needles, MD;  Location: [place];  Service: Orthopedics;  Laterality: Right;  . HIP PINNING,CANNULATED Right 11/07/2016   Procedure: RIGHT CANNULATED HIP PINNING;  Surgeon: Renette Butters, MD;  Location: [place];  Service: Orthopedics;  Laterality: Right;  . INCISION AND DRAINAGE OF WOUND Left 08/04/2015   Procedure: DEBRIDEMENT DISTAL INTERPHALANGEAL LEFT INDEX FINGER;  Surgeon: Daryll Brod, MD;  Location: [place];  Service: Orthopedics;  Laterality: Left;  Marland Kitchen MASS EXCISION N/A 08/04/2015  Procedure: EXCISION MUCOID CYST;  Surgeon: Daryll Brod, MD;  Location: [place];  Service: Orthopedics;  Laterality: N/A;  . MASS EXCISION Left 11/24/2015   Procedure: EXCISION RECURRENT  MUCOID CYST LEFT INDEX  FINGER;  Surgeon: Daryll Brod, MD;  Location: [place];  Service: Orthopedics;  Laterality: Left;  . ORIF FEMUR FRACTURE Right 09/23/2020   Procedure: OPEN  REDUCTION INTERNAL FIXATION (ORIF) DISTAL FEMUR FRACTURE;  Surgeon: Shona Needles, MD;  Location: [place];  Service: Orthopedics;  Laterality: Right;  . ROTATOR CUFF REPAIR     right shoulder  . surgical repair to fx left wrist    . TONSILLECTOMY      Family History  Problem Relation Age of Onset  . Hypertension Mother   . COPD Mother   . Tuberculosis Mother        x 2  . Lung cancer Father   . Stomach cancer Father   . Heart disease Brother   . Drug abuse Maternal Uncle   . Cancer Maternal Uncle   . Alzheimer's disease Maternal Aunt   . Colon cancer Neg Hx   . Esophageal cancer Neg Hx   . Rectal cancer Neg Hx   . Heart attack Neg Hx   . Stroke Neg Hx        Current Outpatient Medications:  .  acetaminophen (TYLENOL) 325 MG tablet, Take 2 tablets (650 mg total) by mouth every 6 (six) hours as needed for mild pain., Disp: , Rfl:  .  amLODipine (NORVASC) 5 MG tablet, Take 1 tablet by mouth daily., Disp: , Rfl:  .  Besifloxacin HCl 0.6 % SUSP, Place 1 drop into both eyes at bedtime., Disp: , Rfl:  .  cholecalciferol (VITAMIN D) 25 MCG tablet, Take 2 tablets (2,000 Units total) by mouth daily., Disp: 60 tablet, Rfl: 0 .  docusate sodium (COLACE) 100 MG capsule, Take 1 capsule (100 mg total) by mouth 2 (two) times daily., Disp: 10 capsule, Rfl: 0 .  enoxaparin (LOVENOX) 40 MG/0.4ML injection, Inject 0.4 mLs (40 mg total) into the skin daily for 28 days., Disp: 11.2 mL, Rfl: 0 .  galantamine (RAZADYNE ER) 24 MG 24 hr capsule, TAKE 1 CAPSULE(24 MG) BY MOUTH DAILY (Patient taking differently: Take 24 mg by mouth in the morning.), Disp: 90 capsule, Rfl: 1 .  latanoprost (XALATAN) 0.005 % ophthalmic solution, Place 1 drop into both eyes at bedtime., Disp: , Rfl:  .  memantine (NAMENDA XR) 28 MG CP24 24 hr capsule, TAKE 1 CAPSULE(28 MG) BY MOUTH DAILY (Patient taking differently: Take 28 mg by mouth at bedtime.), Disp: 90 capsule, Rfl: 1 .  mirtazapine (REMERON) 15 MG tablet, TAKE 1  TABLET BY MOUTH AT BEDTIME (Patient taking differently: Take 15 mg by mouth at bedtime.), Disp: 90 tablet, Rfl: 1 .  Multiple Vitamins-Minerals (CENTRUM SILVER PO), Take 1 tablet by mouth daily., Disp: , Rfl:  .  QUEtiapine (SEROQUEL) 25 MG tablet, TAKE 1 TABLET BY MOUTH AT BEDTIME (Patient taking differently: Take 25 mg by mouth at bedtime.), Disp: 90 tablet, Rfl: 1 .  traMADol (ULTRAM) 50 MG tablet, Take 1 tablet (50 mg total) by mouth every 12 (twelve) hours as needed for moderate pain or severe pain., Disp: 20 tablet, Rfl: 0 .  levothyroxine (SYNTHROID) 88 MCG tablet, Take 1 tablet (88 mcg total) by mouth daily before breakfast., Disp: 90 tablet, Rfl: 3  EXAM:  VITALS per patient if applicable:  GENERAL: alert, appears well and  in no acute distress sitting on couch.   HEENT: atraumatic, conjunttiva clear, no obvious abnormalities on inspection of external nose and ears  NECK: normal movements of the head and neck  LUNGS: on inspection no signs of respiratory distress, breathing rate appears normal, no obvious gross SOB, gasping or wheezing  CV: no obvious cyanosis  MS: moves all visible extremities without noticeable abnormality  PSYCH/NEURO: pleasant and cooperative, no obvious depression or anxiety, speech and thought processing grossly intact  ASSESSMENT AND PLAN:  Discussed the following assessment and plan:  1. Alzheimer's dementia without behavioral disturbance, unspecified timing of dementia onset [facility]) -Daughter questions whether her medications are doing much for her at this point in her disease process.  Currently prescribed Namenda 28 mg, Remeron 15 mg, Seroquel 25 mg, and Razadyne 24 mg.  Advised Namenda likely not beneficial anymore and due to cost we can discontinue this medication.  We will keep all others the same for the time being unless anything changes  2. Other closed fracture of shaft of right femur, sequela - Seems to be recovering well.  - Continue with  home pt/ot/speech therapy and palliative care  3. Essential hypertension - Controlled. No change      I discussed the assessment and treatment plan with the patient. The patient was provided an opportunity to ask questions and all were answered. The patient agreed with the plan and demonstrated an understanding of the instructions.   The patient was advised to call back or seek an in-person evaluation if the symptoms worsen or if the condition fails to improve as anticipated.   Dorothyann Peng, NP

## 2020-10-27 ENCOUNTER — Telehealth: Payer: Self-pay | Admitting: Adult Health

## 2020-10-27 NOTE — Telephone Encounter (Signed)
Okay for verbal orders? Please advise 

## 2020-10-27 NOTE — Telephone Encounter (Signed)
Julia [name] w/[facility] is calling in for verbal orders for [facility] PT 1 week 9.  May leave a detail msg on her secured voice mail.

## 2020-10-27 NOTE — Telephone Encounter (Signed)
Verbal orders given to [facility]

## 2020-10-27 NOTE — Telephone Encounter (Signed)
Ok for verbal orders ?

## 2020-10-28 ENCOUNTER — Telehealth: Payer: Self-pay | Admitting: Adult Health

## 2020-10-28 DIAGNOSIS — Z86010 Personal history of colonic polyps: Secondary | ICD-10-CM | POA: Diagnosis not present

## 2020-10-28 DIAGNOSIS — M9701XD Periprosthetic fracture around internal prosthetic right hip joint, subsequent encounter: Secondary | ICD-10-CM | POA: Diagnosis not present

## 2020-10-28 DIAGNOSIS — M8589 Other specified disorders of bone density and structure, multiple sites: Secondary | ICD-10-CM | POA: Diagnosis not present

## 2020-10-28 DIAGNOSIS — S72401D Unspecified fracture of lower end of right femur, subsequent encounter for closed fracture with routine healing: Secondary | ICD-10-CM | POA: Diagnosis not present

## 2020-10-28 DIAGNOSIS — K219 Gastro-esophageal reflux disease without esophagitis: Secondary | ICD-10-CM | POA: Diagnosis not present

## 2020-10-28 DIAGNOSIS — E440 Moderate protein-calorie malnutrition: Secondary | ICD-10-CM | POA: Diagnosis not present

## 2020-10-28 DIAGNOSIS — I150 Renovascular hypertension: Secondary | ICD-10-CM | POA: Diagnosis not present

## 2020-10-28 DIAGNOSIS — E785 Hyperlipidemia, unspecified: Secondary | ICD-10-CM | POA: Diagnosis not present

## 2020-10-28 DIAGNOSIS — Z9181 History of falling: Secondary | ICD-10-CM | POA: Diagnosis not present

## 2020-10-28 DIAGNOSIS — I129 Hypertensive chronic kidney disease with stage 1 through stage 4 chronic kidney disease, or unspecified chronic kidney disease: Secondary | ICD-10-CM | POA: Diagnosis not present

## 2020-10-28 DIAGNOSIS — N183 Chronic kidney disease, stage 3 unspecified: Secondary | ICD-10-CM | POA: Diagnosis not present

## 2020-10-28 DIAGNOSIS — G309 Alzheimer's disease, unspecified: Secondary | ICD-10-CM | POA: Diagnosis not present

## 2020-10-28 DIAGNOSIS — D631 Anemia in chronic kidney disease: Secondary | ICD-10-CM | POA: Diagnosis not present

## 2020-10-28 DIAGNOSIS — R1310 Dysphagia, unspecified: Secondary | ICD-10-CM | POA: Diagnosis not present

## 2020-10-28 DIAGNOSIS — E039 Hypothyroidism, unspecified: Secondary | ICD-10-CM | POA: Diagnosis not present

## 2020-10-28 DIAGNOSIS — Z4789 Encounter for other orthopedic aftercare: Secondary | ICD-10-CM | POA: Diagnosis not present

## 2020-10-28 DIAGNOSIS — H409 Unspecified glaucoma: Secondary | ICD-10-CM | POA: Diagnosis not present

## 2020-10-28 DIAGNOSIS — Z7901 Long term (current) use of anticoagulants: Secondary | ICD-10-CM | POA: Diagnosis not present

## 2020-10-28 NOTE — Telephone Encounter (Signed)
Okay for verbal orders? Please advise 

## 2020-10-28 NOTE — Telephone Encounter (Signed)
Ok for verbal orders ?

## 2020-10-28 NOTE — Telephone Encounter (Signed)
Julia [name] is calling in to get verbal orders for OT 1 week 8.  May leave a detail msg on her secured voice mail.

## 2020-10-29 DIAGNOSIS — Z86010 Personal history of colonic polyps: Secondary | ICD-10-CM | POA: Diagnosis not present

## 2020-10-29 DIAGNOSIS — K219 Gastro-esophageal reflux disease without esophagitis: Secondary | ICD-10-CM | POA: Diagnosis not present

## 2020-10-29 DIAGNOSIS — E440 Moderate protein-calorie malnutrition: Secondary | ICD-10-CM | POA: Diagnosis not present

## 2020-10-29 DIAGNOSIS — R1310 Dysphagia, unspecified: Secondary | ICD-10-CM | POA: Diagnosis not present

## 2020-10-29 DIAGNOSIS — Z9181 History of falling: Secondary | ICD-10-CM | POA: Diagnosis not present

## 2020-10-29 DIAGNOSIS — M8589 Other specified disorders of bone density and structure, multiple sites: Secondary | ICD-10-CM | POA: Diagnosis not present

## 2020-10-29 DIAGNOSIS — E039 Hypothyroidism, unspecified: Secondary | ICD-10-CM | POA: Diagnosis not present

## 2020-10-29 DIAGNOSIS — I150 Renovascular hypertension: Secondary | ICD-10-CM | POA: Diagnosis not present

## 2020-10-29 DIAGNOSIS — E785 Hyperlipidemia, unspecified: Secondary | ICD-10-CM | POA: Diagnosis not present

## 2020-10-29 DIAGNOSIS — S72401D Unspecified fracture of lower end of right femur, subsequent encounter for closed fracture with routine healing: Secondary | ICD-10-CM | POA: Diagnosis not present

## 2020-10-29 DIAGNOSIS — Z4789 Encounter for other orthopedic aftercare: Secondary | ICD-10-CM | POA: Diagnosis not present

## 2020-10-29 DIAGNOSIS — D631 Anemia in chronic kidney disease: Secondary | ICD-10-CM | POA: Diagnosis not present

## 2020-10-29 DIAGNOSIS — G309 Alzheimer's disease, unspecified: Secondary | ICD-10-CM | POA: Diagnosis not present

## 2020-10-29 DIAGNOSIS — I129 Hypertensive chronic kidney disease with stage 1 through stage 4 chronic kidney disease, or unspecified chronic kidney disease: Secondary | ICD-10-CM | POA: Diagnosis not present

## 2020-10-29 DIAGNOSIS — Z7901 Long term (current) use of anticoagulants: Secondary | ICD-10-CM | POA: Diagnosis not present

## 2020-10-29 DIAGNOSIS — H409 Unspecified glaucoma: Secondary | ICD-10-CM | POA: Diagnosis not present

## 2020-10-29 DIAGNOSIS — M9701XD Periprosthetic fracture around internal prosthetic right hip joint, subsequent encounter: Secondary | ICD-10-CM | POA: Diagnosis not present

## 2020-10-29 DIAGNOSIS — N183 Chronic kidney disease, stage 3 unspecified: Secondary | ICD-10-CM | POA: Diagnosis not present

## 2020-10-29 NOTE — Telephone Encounter (Signed)
Spoke with [facility] and she stated that VO was already given by our front office staff. No further action needed!

## 2020-10-30 ENCOUNTER — Telehealth: Payer: Self-pay | Admitting: Adult Health

## 2020-10-30 DIAGNOSIS — E785 Hyperlipidemia, unspecified: Secondary | ICD-10-CM | POA: Diagnosis not present

## 2020-10-30 DIAGNOSIS — M9701XD Periprosthetic fracture around internal prosthetic right hip joint, subsequent encounter: Secondary | ICD-10-CM | POA: Diagnosis not present

## 2020-10-30 DIAGNOSIS — M8589 Other specified disorders of bone density and structure, multiple sites: Secondary | ICD-10-CM | POA: Diagnosis not present

## 2020-10-30 DIAGNOSIS — K219 Gastro-esophageal reflux disease without esophagitis: Secondary | ICD-10-CM | POA: Diagnosis not present

## 2020-10-30 DIAGNOSIS — R1310 Dysphagia, unspecified: Secondary | ICD-10-CM | POA: Diagnosis not present

## 2020-10-30 DIAGNOSIS — Z9181 History of falling: Secondary | ICD-10-CM | POA: Diagnosis not present

## 2020-10-30 DIAGNOSIS — I129 Hypertensive chronic kidney disease with stage 1 through stage 4 chronic kidney disease, or unspecified chronic kidney disease: Secondary | ICD-10-CM | POA: Diagnosis not present

## 2020-10-30 DIAGNOSIS — E039 Hypothyroidism, unspecified: Secondary | ICD-10-CM | POA: Diagnosis not present

## 2020-10-30 DIAGNOSIS — I150 Renovascular hypertension: Secondary | ICD-10-CM | POA: Diagnosis not present

## 2020-10-30 DIAGNOSIS — D631 Anemia in chronic kidney disease: Secondary | ICD-10-CM | POA: Diagnosis not present

## 2020-10-30 DIAGNOSIS — Z7901 Long term (current) use of anticoagulants: Secondary | ICD-10-CM | POA: Diagnosis not present

## 2020-10-30 DIAGNOSIS — G309 Alzheimer's disease, unspecified: Secondary | ICD-10-CM | POA: Diagnosis not present

## 2020-10-30 DIAGNOSIS — Z86010 Personal history of colonic polyps: Secondary | ICD-10-CM | POA: Diagnosis not present

## 2020-10-30 DIAGNOSIS — Z4789 Encounter for other orthopedic aftercare: Secondary | ICD-10-CM | POA: Diagnosis not present

## 2020-10-30 DIAGNOSIS — H409 Unspecified glaucoma: Secondary | ICD-10-CM | POA: Diagnosis not present

## 2020-10-30 DIAGNOSIS — N183 Chronic kidney disease, stage 3 unspecified: Secondary | ICD-10-CM | POA: Diagnosis not present

## 2020-10-30 DIAGNOSIS — S72401D Unspecified fracture of lower end of right femur, subsequent encounter for closed fracture with routine healing: Secondary | ICD-10-CM | POA: Diagnosis not present

## 2020-10-30 DIAGNOSIS — E440 Moderate protein-calorie malnutrition: Secondary | ICD-10-CM | POA: Diagnosis not present

## 2020-10-30 NOTE — Telephone Encounter (Signed)
Left detailed message informing  of update. 

## 2020-10-30 NOTE — Telephone Encounter (Signed)
Okay for verbal orders? Please advise 

## 2020-10-30 NOTE — Telephone Encounter (Signed)
Verbal orders given to [place]. no further action needed.

## 2020-10-30 NOTE — Telephone Encounter (Signed)
Ok for verbal orders ?

## 2020-10-30 NOTE — Telephone Encounter (Signed)
Julia [name] is calling and requesting to get verbal orders for speech therapy. Frequency is 1 week 6 to address cognition, please advise. CB is [phone]

## 2020-11-03 ENCOUNTER — Telehealth: Payer: Self-pay | Admitting: Nurse Practitioner

## 2020-11-03 DIAGNOSIS — S72401D Unspecified fracture of lower end of right femur, subsequent encounter for closed fracture with routine healing: Secondary | ICD-10-CM | POA: Diagnosis not present

## 2020-11-03 NOTE — Telephone Encounter (Signed)
Spoke with patient's daughter, [name], to see if we could change the telehealth visit scheduled for 11/04/20 to an In-home visit to 11/05/20 @ 9 AM and daughter was in agreement with this.

## 2020-11-04 ENCOUNTER — Other Ambulatory Visit: Payer: Medicare Other | Admitting: Nurse Practitioner

## 2020-11-04 DIAGNOSIS — E785 Hyperlipidemia, unspecified: Secondary | ICD-10-CM | POA: Diagnosis not present

## 2020-11-04 DIAGNOSIS — Z7901 Long term (current) use of anticoagulants: Secondary | ICD-10-CM | POA: Diagnosis not present

## 2020-11-04 DIAGNOSIS — M8589 Other specified disorders of bone density and structure, multiple sites: Secondary | ICD-10-CM | POA: Diagnosis not present

## 2020-11-04 DIAGNOSIS — E039 Hypothyroidism, unspecified: Secondary | ICD-10-CM | POA: Diagnosis not present

## 2020-11-04 DIAGNOSIS — M9701XD Periprosthetic fracture around internal prosthetic right hip joint, subsequent encounter: Secondary | ICD-10-CM | POA: Diagnosis not present

## 2020-11-04 DIAGNOSIS — G309 Alzheimer's disease, unspecified: Secondary | ICD-10-CM | POA: Diagnosis not present

## 2020-11-04 DIAGNOSIS — I150 Renovascular hypertension: Secondary | ICD-10-CM | POA: Diagnosis not present

## 2020-11-04 DIAGNOSIS — I129 Hypertensive chronic kidney disease with stage 1 through stage 4 chronic kidney disease, or unspecified chronic kidney disease: Secondary | ICD-10-CM | POA: Diagnosis not present

## 2020-11-04 DIAGNOSIS — N183 Chronic kidney disease, stage 3 unspecified: Secondary | ICD-10-CM | POA: Diagnosis not present

## 2020-11-04 DIAGNOSIS — K219 Gastro-esophageal reflux disease without esophagitis: Secondary | ICD-10-CM | POA: Diagnosis not present

## 2020-11-04 DIAGNOSIS — R1310 Dysphagia, unspecified: Secondary | ICD-10-CM | POA: Diagnosis not present

## 2020-11-04 DIAGNOSIS — H409 Unspecified glaucoma: Secondary | ICD-10-CM | POA: Diagnosis not present

## 2020-11-04 DIAGNOSIS — D631 Anemia in chronic kidney disease: Secondary | ICD-10-CM | POA: Diagnosis not present

## 2020-11-04 DIAGNOSIS — E440 Moderate protein-calorie malnutrition: Secondary | ICD-10-CM | POA: Diagnosis not present

## 2020-11-04 DIAGNOSIS — S72401D Unspecified fracture of lower end of right femur, subsequent encounter for closed fracture with routine healing: Secondary | ICD-10-CM | POA: Diagnosis not present

## 2020-11-04 DIAGNOSIS — Z4789 Encounter for other orthopedic aftercare: Secondary | ICD-10-CM | POA: Diagnosis not present

## 2020-11-04 DIAGNOSIS — Z86010 Personal history of colonic polyps: Secondary | ICD-10-CM | POA: Diagnosis not present

## 2020-11-04 DIAGNOSIS — Z9181 History of falling: Secondary | ICD-10-CM | POA: Diagnosis not present

## 2020-11-05 ENCOUNTER — Other Ambulatory Visit: Payer: Medicare Other | Admitting: Nurse Practitioner

## 2020-11-05 ENCOUNTER — Encounter: Payer: Self-pay | Admitting: Nurse Practitioner

## 2020-11-05 ENCOUNTER — Other Ambulatory Visit: Payer: Self-pay

## 2020-11-05 DIAGNOSIS — G301 Alzheimer's disease with late onset: Secondary | ICD-10-CM | POA: Diagnosis not present

## 2020-11-05 DIAGNOSIS — F0280 Dementia in other diseases classified elsewhere without behavioral disturbance: Secondary | ICD-10-CM

## 2020-11-05 DIAGNOSIS — Z515 Encounter for palliative care: Secondary | ICD-10-CM | POA: Diagnosis not present

## 2020-11-05 DIAGNOSIS — E440 Moderate protein-calorie malnutrition: Secondary | ICD-10-CM | POA: Diagnosis not present

## 2020-11-05 NOTE — Progress Notes (Addendum)
[facility] Consult Note Telephone: [phone]  Fax: [phone]    Date of encounter: 11/05/20 PATIENT NAME: [place] [address]   [phone] (home)  DOB: [birth year] MRN: [id number] PRIMARY CARE PROVIDER:    Dorothyann Peng, NP,  [place] [address] [phone]  RESPONSIBLE PARTY:    Contact Information     Name Relation Home Work Mobile   Brault,Chester L Spouse [phone]     Idelle Leech Daughter   [phone]   Neale Burly Daughter   [phone]      I met face to face with patient and Daughter Kenney Houseman in home. Palliative Care was asked to follow this patient by consultation request of  Nafziger, Tommi Rumps, NP to address advance care planning and complex medical decision making. This is the initial visit.   ASSESSMENT AND PLAN / RECOMMENDATIONS:   Advance Care Planning/Goals of Care: Goals include to maximize quality of life and symptom management. Our advance care planning conversation included a discussion about:    The value and importance of advance care planning  Experiences with loved ones who have been seriously ill or have died  Exploration of personal, cultural or spiritual beliefs that might influence medical decisions  Exploration of goals of care in the event of a sudden injury or illness  Identification and preparation of a healthcare agent  Review and updating or creation of an  advance directive document . Decision not to resuscitate or to de-escalate disease focused treatments due to poor prognosis. CODE STATUS: Full code  Symptom Management/Plan: ACP: Full code, discussed at length code status, reviewed full code vs DNR; We talked about senerios. We talked and reviewed current MOST form as well as blank MOST form given with Hard Choice book for review. Kenney Houseman endorses she does not think cpr would be helpful, open to ongoing discussions, will  review at next [facility] visit or sooner if Kenney Houseman wishes to revisit prior to appointment  2.  Pain-patient with pain secondary to right femur fracture. Recommend continuing acetaminophen 1000 mg TID, tramadol 50 mg every 12 hours PRN. Ms. Nowack does not required to use tamadol while at home. Ms. Rada denies pain, Kenney Houseman endorses she does not appear to be in pain.    3. Weakness secondary to right femur fracture-continue PT/OT as directed. Monitor for falls/safety. Discussed slow progression   4. Alzheimer's dementia-FAST score 6C. Namenda was d/c due to pill burden, ineffective at this time in Alzheimers stage, continue mirtazapine 736m QHS, quetiapine 251mQHS. Provide cueing/redirection/reorient as needed. Recommended melatonin for insomnia 36m45mhs as needed to try. Talked about sleep patterns, hygiene  5. protein calorie malnutrition secondary to dementia. Albumin 3.2 on 09/23/20. Encourage soft foods, foods that patient enjoys; offer yogurt with meals. Monitor weights. Discussed nutrition. We talked about weights; Weighted and measured arm/leg to continue to monitor.  Left arm 23 cm Leg leg 32 cm 09/08/2020 weight 127.2 lbs 11/05/2020 weight 117.8 lbs  Follow up Palliative Care Visit: Palliative care will continue to follow for complex medical decision making, advance care planning, and clarification of goals. Return 6 weeks or prn.  I spent 65 minutes providing this consultation. More than 50% of the time in this consultation was spent in counseling and care coordination.  PPS: 40%  HOSPICE ELIGIBILITY/DIAGNOSIS: TBD  Chief Complaint: Follow up visit for palliative consult for complex medical decision making  HISTORY OF PRESENT ILLNESS:  EliRORIE DELMORE  a 86 y.o. year old female  with multiple medical problems including alzheimer's dementia, protein calorie-malnutrition, essential hypertension, CKD 3,  hypothyroidism, GERD. Patient fell on 4/26, sustained closed right distal femur fracture;  repair on 4/27. Patient discharged to [facility] on 09/28/20, now d/c home. I called Tonya In COVID screening which was negative. I visited Ms Augsburger in her home she walked up the hall with walker and sat on the couch with Tonya present. Mr Mckissic was back in his room during palliative care consult. Tonya and I talked about the last time Ms Sigel was independent at home about 10 years ago, stop driving about seven years ago. We talked about past medical history, chronic disease progression of dementia. We talked about recent hospitalization for fracture. We talked about discharge to [facility] place and their experience. We talked about palliative care visit at [facility] place. We talked about now that Ms Riding is home how she is doing. Kenney Houseman endorses it is very hard to get her to eat. Ms Childers will eat finger foods and feed herself but it is definitely difficult to get her to drink. Kenney Houseman endorses she will eat yogurt that is her favorite food. We talked about realistic expectations for eating, drinking in the setting of someone with dementia. We talked about expectations with progression. We talked about different options she can try with nutrition. We talked about weights and reweighed Ms Medellin during palliative care visit. We talked about symptoms of pain which  Ms Fedorko does not seem to demonstrate. Kenney Houseman endorses she has not had to give her any tramadol since they have been home. Kenney Houseman will give her Tylenol on occasion. Ms Baumert  does not complain of pain nor does she act like she is hurting. We talked about physical therapy and occupational therapy. Kenney Houseman endorses they have another two weeks before they are re evaluation. Kenney Houseman is hoping that they will stay in for the entire amount of time. Kenney Houseman endorses Ms Florance was not using a Walker prior to hospitalization but since has now required to. Kenney Houseman endorses she has seen a significant overall decline.Talked about functional adls. Ms Flanery does require  to be bathed and is able to help get herself dressed though very limited. Kenney Houseman endorses that she is getting to the point where she does not want change her clothes. Ms Paletta  does wear adult briefs as she is having episodes of incontinence and no self awareness. Kenney Houseman endorses Ms Calia also uses the bathroom intermittently. Kenney Houseman endorses Ms Zemanek does not have self awareness when she is wet or soiled. Kenney Houseman endorses it is a chore to get her a bath or to change her clothes. We talked about cognitive changes which she seems to be at her new baseline. Ms Santaella continues to act like she is 86 years old. Life review completed. 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 Ms Schuitema had five children Kenney Houseman being the youngest and she was adopted at the age of [age]. Ms Wiest has been married over 30 years and her husband is physically disabled. Kenney Houseman talked about the family dynamics. Kenney Houseman is the main caregiver and health care power of attorney. Ms Helinski talked a lot about her son Sam who visits on occasion and does sit when needed. Kenney Houseman talked about the caregiver plan they have put together. Kenney Houseman comes and stays makes their meals, pays their bills, cleans their house, completes adls. Her niece does come and  sit and assist in between giving Tonya break. Tonya and I talked about further planning as things progress. We talked about in home caregivers, sitters intermittently. Kenney Houseman endorses that they have been thinking about next steps. We talked about further discussion with palliative social worker concerning resources available for in home. Kenney Houseman endorses she was thinking possibly about moving her mom and dad to her home and having her adult daughter moved to their home.Kenney Houseman endorses it would be easier for Tonya to take care of them rather than going back and forth between two homes. Kenney Houseman says she lives about 6 minutes from the peak rooms. We talked about nutrition at length. We  talked about with different options for protein including yogurt, meals. Offering frequent meals and drink though out the day with Ms Irigoyen. Weight repeated during palliative care visit Ms Landa was able to stand on the scale with her walker. We talked about medical goals of care including aggressive versus conservative versus comfort care. Reviewed current MOST form that shares full code, full scope of treatment, IV fluids, antibiotics. Reviewed MOST form. Left a blank copy with Hard Choice book. We talked about current clinical condition in the setting of chronic disease of dementia with ongoing decline in progression. Kenney Houseman endorses she will think about code status to revisit and options of treatment, revisit at next palliative care visit. We talked about should she decided to change prior to appointment just contact palliative provider will get completed for them. We talked about role palliative care and plan of care. We talked about challenges that Kenney Houseman has been facing getting Ms Loudon to the physician office. She has not gone any year and has an upcoming appointment in August for hospital follow up. We talked about option of in home providers such as [facility]. Kenney Houseman was going to look into that and requested to see if that can be set up for them. Will follow up with palliative care Social Worker in addition to [facility] [employer] application that was completed. Kenney Houseman endorses they still have not received or heard anything from [facility]. We talked about getting palliative nurse to follow up in two weeks with a phone call and scheduled nurse practitioner in home Palliative followup in six weeks about that time therapy should be completed. We did talk about Hospice services under the Medicare benefit and what is provided. We did talk about the weight loss and the setting of dementia and Hospice. We talked about stabilization and discharge as well. Discussed at present time will continue with physical  therapy to see how she does and revisit Hospice upon completion. Tonya and agreement. Therapeutic listening and emotional support provided. Contact information. Questions answered to satisfaction.   History obtained from review of EMR, discussion with Daughter Kenney Houseman and Ms. Curlin.  I reviewed available labs, medications, imaging, studies and related documents from the EMR.  Records reviewed and summarized above.   ROS Full 14 system review of systems performed and negative with exception of: as per HPI.   Physical Exam: Constitutional: NAD General: frail appearing, thin/ pleasantly confused female EYES: lids intact ENMT:  oral mucous membranes moist CV: S1S2, RRR,  Pulmonary: LCTA,  room air Abdomen: soft and non tender MSK: ambulatory with walker Skin: warm and dry, no rashes or wounds on visible skin Neuro:  + generalized weakness,  + cognitive impairment Psych: non-anxious affect, A and Oriented to self  CURRENT PROBLEM LIST:  Patient Active Problem List   Diagnosis Date Noted  Malnutrition of moderate degree 09/27/2020   Closed fracture of shaft of right femur ([place]) 09/22/2020   CKD (chronic kidney disease) stage 3, GFR 30-59 ml/min (HCC) 09/22/2020   Closed intertrochanteric fracture of hip, right, initial encounter ([place]) 03/27/2017   Mild dementia ([place]) 03/27/2017   Leukocytosis 03/27/2017   Closed fracture of right hip (HCC)    Closed nondisplaced fracture of base of neck of right femur ([place]) 11/07/2016   Sinus bradycardia 09/05/2014   Junctional escape rhythm 09/05/2014   Alzheimer's dementia ([place]) 04/21/2014   Overactive bladder 04/21/2014   Hypothyroidism, acquired 11/21/2013   PALPITATIONS, RECURRENT 12/09/2008   Hyperlipidemia 04/18/2007   Essential hypertension 12/11/2006   GERD 12/11/2006   Osteopenia 12/11/2006   History of colonic polyps 12/11/2006   PAST MEDICAL HISTORY:  Active Ambulatory Problems    Diagnosis Date Noted   Hyperlipidemia 04/18/2007    Essential hypertension 12/11/2006   GERD 12/11/2006   Osteopenia 12/11/2006   PALPITATIONS, RECURRENT 12/09/2008   History of colonic polyps 12/11/2006   Hypothyroidism, acquired 11/21/2013   Alzheimer's dementia ([place]) 04/21/2014   Overactive bladder 04/21/2014   Sinus bradycardia 09/05/2014   Junctional escape rhythm 09/05/2014   Closed nondisplaced fracture of base of neck of right femur ([place]) 11/07/2016   Closed fracture of right hip ([place])    Closed intertrochanteric fracture of hip, right, initial encounter ([place]) 03/27/2017   Mild dementia ([place]) 03/27/2017   Leukocytosis 03/27/2017   Closed fracture of shaft of right femur ([place]) 09/22/2020   CKD (chronic kidney disease) stage 3, GFR 30-59 ml/min ([place]) 09/22/2020   Malnutrition of moderate degree 09/27/2020   Resolved Ambulatory Problems    Diagnosis Date Noted   URI 11/03/2009   SEBACEOUS CYST, INFECTED 03/16/2009   Other malaise and fatigue 11/20/2007   Abdominal pain, epigastric 02/11/2008   Concussion with no loss of consciousness 05/14/2008   UNS ADVRS EFF UNS RX MEDICINAL&BIOLOGICAL SBSTNC 11/20/2007   Headache around the eyes 07/19/2010   Preventative health care 07/19/2010   Unspecified constipation 02/28/2012   Tingling in extremities 07/08/2014   Dizziness 09/05/2014   SOB (shortness of breath) 09/05/2014   Generalized anxiety disorder 02/10/2015   Past Medical History:  Diagnosis Date   Anxiety    Dementia ([place])    GERD (gastroesophageal reflux disease)    Hx of echocardiogram    Hypertension    Hypothyroidism    Right forearm fracture 2011   Thyroid disease    SOCIAL HX:  Social History   Tobacco Use   Smoking status: Never   Smokeless tobacco: Never  Substance Use Topics   Alcohol use: No    Alcohol/week: 0.0 standard drinks   FAMILY HX:  Family History  Problem Relation Age of Onset   Hypertension Mother    COPD Mother    Tuberculosis Mother        x 2   Lung cancer Father    Stomach  cancer Father    Heart disease Brother    Drug abuse Maternal Uncle    Cancer Maternal Uncle    Alzheimer's disease Maternal Aunt    Colon cancer Neg Hx    Esophageal cancer Neg Hx    Rectal cancer Neg Hx    Heart attack Neg Hx    Stroke Neg Hx   reviewed  ALLERGIES:  Allergies  Allergen Reactions   Entex Other (See Comments)    Reaction unknown    Sulfonamide Derivatives Other (See Comments)    Reaction is  not known- allergy is from years ago     PERTINENT MEDICATIONS:   Questions and concerns were addressed. The patient/family was encouraged to call with questions and/or concerns. My business card was provided. Provided general support and encouragement, no other unmet needs identified   Thank you for the opportunity to participate in the care of Ms. Deans.  The palliative care team will continue to follow. Please call our office at [phone] if we can be of additional assistance.   This chart was dictated using voice recognition software.  Despite best efforts to proofread,  errors can occur which can change the documentation meaning.   Calissa Swenor Z Raelyn Racette, NP , MSN,  ACHPN  COVID-19 PATIENT SCREENING TOOL Asked and negative response unless otherwise noted:   Have you had symptoms of covid, tested positive or been in contact with someone with symptoms/positive test in the past 5-10 days?  NO

## 2020-11-10 ENCOUNTER — Encounter: Payer: Self-pay | Admitting: Adult Health

## 2020-11-10 ENCOUNTER — Other Ambulatory Visit: Payer: Medicare Other | Admitting: Nurse Practitioner

## 2020-11-10 ENCOUNTER — Other Ambulatory Visit: Payer: Self-pay

## 2020-11-10 ENCOUNTER — Other Ambulatory Visit: Payer: Medicare Other

## 2020-11-10 ENCOUNTER — Encounter: Payer: Self-pay | Admitting: Nurse Practitioner

## 2020-11-10 VITALS — BP 142/68 | HR 77 | Temp 98.0°F | Resp 18

## 2020-11-10 DIAGNOSIS — M8589 Other specified disorders of bone density and structure, multiple sites: Secondary | ICD-10-CM | POA: Diagnosis not present

## 2020-11-10 DIAGNOSIS — Z9181 History of falling: Secondary | ICD-10-CM | POA: Diagnosis not present

## 2020-11-10 DIAGNOSIS — H409 Unspecified glaucoma: Secondary | ICD-10-CM | POA: Diagnosis not present

## 2020-11-10 DIAGNOSIS — I150 Renovascular hypertension: Secondary | ICD-10-CM | POA: Diagnosis not present

## 2020-11-10 DIAGNOSIS — R1310 Dysphagia, unspecified: Secondary | ICD-10-CM | POA: Diagnosis not present

## 2020-11-10 DIAGNOSIS — Z515 Encounter for palliative care: Secondary | ICD-10-CM

## 2020-11-10 DIAGNOSIS — Z7901 Long term (current) use of anticoagulants: Secondary | ICD-10-CM | POA: Diagnosis not present

## 2020-11-10 DIAGNOSIS — E785 Hyperlipidemia, unspecified: Secondary | ICD-10-CM | POA: Diagnosis not present

## 2020-11-10 DIAGNOSIS — E039 Hypothyroidism, unspecified: Secondary | ICD-10-CM | POA: Diagnosis not present

## 2020-11-10 DIAGNOSIS — E440 Moderate protein-calorie malnutrition: Secondary | ICD-10-CM | POA: Diagnosis not present

## 2020-11-10 DIAGNOSIS — I129 Hypertensive chronic kidney disease with stage 1 through stage 4 chronic kidney disease, or unspecified chronic kidney disease: Secondary | ICD-10-CM | POA: Diagnosis not present

## 2020-11-10 DIAGNOSIS — Z4789 Encounter for other orthopedic aftercare: Secondary | ICD-10-CM | POA: Diagnosis not present

## 2020-11-10 DIAGNOSIS — G309 Alzheimer's disease, unspecified: Secondary | ICD-10-CM | POA: Diagnosis not present

## 2020-11-10 DIAGNOSIS — R451 Restlessness and agitation: Secondary | ICD-10-CM | POA: Diagnosis not present

## 2020-11-10 DIAGNOSIS — K219 Gastro-esophageal reflux disease without esophagitis: Secondary | ICD-10-CM | POA: Diagnosis not present

## 2020-11-10 DIAGNOSIS — D631 Anemia in chronic kidney disease: Secondary | ICD-10-CM | POA: Diagnosis not present

## 2020-11-10 DIAGNOSIS — N183 Chronic kidney disease, stage 3 unspecified: Secondary | ICD-10-CM | POA: Diagnosis not present

## 2020-11-10 DIAGNOSIS — Z86010 Personal history of colonic polyps: Secondary | ICD-10-CM | POA: Diagnosis not present

## 2020-11-10 DIAGNOSIS — M9701XD Periprosthetic fracture around internal prosthetic right hip joint, subsequent encounter: Secondary | ICD-10-CM | POA: Diagnosis not present

## 2020-11-10 DIAGNOSIS — S72401D Unspecified fracture of lower end of right femur, subsequent encounter for closed fracture with routine healing: Secondary | ICD-10-CM | POA: Diagnosis not present

## 2020-11-10 NOTE — Progress Notes (Signed)
PATIENT NAME: Julia [name] DOB: [birth year] MRN: [id number]  PRIMARY CARE PROVIDER: Dorothyann Peng, NP  RESPONSIBLE PARTY:  Acct ID - Guarantor Home Phone Work Phone Relationship Acct Type  [id number] - [name],[name]  Self P/F     [address], [name], [name] [address]    PLAN OF CARE and INTERVENTIONS:               1.  GOALS OF CARE/ ADVANCE CARE PLANNING:  Remain home under the care of her family.               2.  PATIENT/CAREGIVER EDUCATION:  S/S of UTI, Alzheimer' Disease Progression and Hospice Care.               4. PERSONAL EMERGENCY PLAN:  Activate 911 for emergencies.                5.  DISEASE STATUS:  Joint visit completed with Tonya and patient.  New onset of urinary incontinence over the last 2 days.  Patient was previously wearing a  poise pad for dribbling but was able to take herself to the bathroom to void. There have been times in the last 2 days patient stated she did not feel the urge to void but when taken to the bathroom she was able to empty her bladder. Daughter notes patient has voided on the sofa but was not aware of doing this.  Patient now has depends on and protective padding on the sofa.  Increase agitation and combativeness noted by daughter.  Fluid intake is relatively poor and daughter reports only 12 oz of liquid is consumed per day.  The family is encouraging fluids but patient is resistant.  Daughter has contacted PCP office through [facility] requesting U/A be completed.  She has a hat and specimen cup and is able to obtain a clean catch in the home and transport it to the PCP office.  Message sent to [facility] [place] through Epic to see if this could be done.  Daughter would need to still contact the office with this request and possible do a virtual with NP.  Primary NP is currently out of the office.  Daughter has contacted the office by telephone and is awaiting a call back.  Memantine was discontinued last month on 5/27 per daughter.   Questioning if some of patient's symptoms could be related to stopping Memantine.  Encouraged to follow up with PCP regarding this concern.   OT present for re-evaluation of services.  Therapy will be placed on hold next week as daughter will be out of town.  Therapy will follow up with daughter regarding status and if U/A was completed.   Discussion completed on hospice, disease progression of Alzheimer's Disease and possible UTI.   Daughter would like to rule out UTI and have Palliative Care monitor patient for hospice eligibility.    HISTORY OF PRESENT ILLNESS:  86 year old female with Alzheimer's Disease.  Patient is being followed by Palliative care every 1-2 months and PRN.  CODE STATUS: DNR-per MOST form. ADVANCED DIRECTIVES: Yes MOST FORM: Yes PPS: 40%   PHYSICAL EXAM:   VITALS: Today's Vitals   11/10/20 1326  BP: (!) 142/68  Pulse: 77  Resp: 18  Temp: 98 F (36.7 C)  SpO2: 97%  PainSc: 0-No pain    LUNGS: clear to auscultation  CARDIAC: Cor RRR}  EXTREMITIES: - for edema SKIN: Skin color, texture, turgor normal. No rashes or lesions or  normal  NEURO:  + memory problems, unsteady gait       Lorenza Burton, RN

## 2020-11-10 NOTE — Progress Notes (Signed)
[facility] Consult Note Telephone: [phone]  Fax: [phone]    Date of encounter: 11/10/20 PATIENT NAME: Julia [name] [address] [place] [address]   [phone] (home)  DOB: 09/24/1934 MRN: [id number] PRIMARY CARE PROVIDER:    Dorothyann Peng, NP,  [place] [address] [phone]  RESPONSIBLE PARTY:    Contact Information     Name Relation Home Work Mobile   Julia [name],Julia [name] Spouse [phone]     Julia [name]   [phone]   Julia [name]   [phone]      Due to the COVID-19 crisis, this visit was done via telemedicine from my office and it was initiated and consent by this patient and or family.  I connected with Julia [name] for  Julia [name] on 11/10/20 by a video enabled telemedicine application and verified that I am speaking with the correct person.   I discussed the limitations of evaluation and management by telemedicine. The patient expressed understanding and agreed to proceed. Palliative Care was asked to follow this patient by consultation request of  Julia [name], Julia Rumps, NP to address advance care planning and complex medical decision making. This is a follow up visit.  ASSESSMENT AND PLAN / RECOMMENDATIONS:   Advance Care Planning/Goals of Care: Goals include to maximize quality of life and symptom management. Our advance care planning conversation included a discussion about:    The value and importance of advance care planning  Experiences with loved ones who have been seriously ill or have died  Exploration of personal, cultural or spiritual beliefs that might influence medical decisions  Exploration of goals of care in the event of a sudden injury or illness  Identification and preparation of a healthcare agent  Review and updating or creation of an  advance directive document . Decision not to resuscitate or to de-escalate  disease focused treatments due to poor prognosis. CODE STATUS: DNR  Symptom Management/Plan: ACP: Reviewed aggressive vs conservative vs comfort care with code status; Reviewed and completed MOST form to say DNR; Limited interventions; no feeding tube; wishes are for antibiotics/IVF; completed in vynca.    2.  Increase in agitation/urinary incontinence possible secondary to UTI,  Alzheimer's dementia-FAST score 6C. Will d/c melatonin for now as likely not the cause of agitation or incontinence. It would be more likely UTI and recommended to call primary to see if they will see her. Also will have PC RN make a visit for re-visit decline and option of hospice if Julia [name] has declined to meet criteria..   Follow up Palliative Care Visit: Palliative care will continue to follow for complex medical decision making, advance care planning, and clarification of goals. Return 4 weeks or prn.  I spent 35 minutes providing this consultation. More than 50% of the time in this consultation was spent in counseling and care coordination.  PPS: 40%  HOSPICE ELIGIBILITY/DIAGNOSIS: TBD  Chief Complaint: Follow up palliative consult for complex medical decision making  HISTORY OF PRESENT ILLNESS:  Julia [name] is a 86 y.o. year old female  with alzheimer's dementia, protein calorie-malnutrition, essential hypertension, CKD 3,  hypothyroidism, GERD. Patient fell on 4/26, sustained closed right distal femur fracture; repair on 4/27. Patient discharged to [facility] on 09/28/20, now d/c home.   History obtained from review of EMR, discussion with Julia,Julia [name] for Julia [name]  I reviewed available labs, medications, imaging, studies and related documents  from the EMR.  Records reviewed and summarized above.   ROS Full 14 system review of systems performed and negative with exception of: as per HPI.   Physical Exam: deferred  Questions and concerns were addressed. The patient/family  was encouraged to call with questions and/or concerns. My contact information was provided. Provided general support and encouragement, no other unmet needs identified   Thank you for the opportunity to participate in the care of Ms. Pavelko.  The palliative care team will continue to follow. Please call our office at [phone] if we can be of additional assistance.   This chart was dictated using voice recognition software.  Despite best efforts to proofread,  errors can occur which can change the documentation meaning.   Tansy Lorek Ihor Gully, NP , MSN, [facility]

## 2020-11-10 NOTE — Telephone Encounter (Signed)
Patient daughter is calling and stated that the hospice nurse is there and wanted to know if they can get the urine sample and bring it in. Daughter is asking for a call back. CB is [phone] Jake Samples)

## 2020-11-11 ENCOUNTER — Telehealth: Payer: Self-pay

## 2020-11-11 DIAGNOSIS — H409 Unspecified glaucoma: Secondary | ICD-10-CM | POA: Diagnosis not present

## 2020-11-11 DIAGNOSIS — Z20822 Contact with and (suspected) exposure to covid-19: Secondary | ICD-10-CM | POA: Diagnosis not present

## 2020-11-11 DIAGNOSIS — N3091 Cystitis, unspecified with hematuria: Secondary | ICD-10-CM | POA: Diagnosis not present

## 2020-11-11 DIAGNOSIS — M8589 Other specified disorders of bone density and structure, multiple sites: Secondary | ICD-10-CM | POA: Diagnosis not present

## 2020-11-11 DIAGNOSIS — Z9181 History of falling: Secondary | ICD-10-CM | POA: Diagnosis not present

## 2020-11-11 DIAGNOSIS — S72401D Unspecified fracture of lower end of right femur, subsequent encounter for closed fracture with routine healing: Secondary | ICD-10-CM | POA: Diagnosis not present

## 2020-11-11 DIAGNOSIS — R0981 Nasal congestion: Secondary | ICD-10-CM | POA: Diagnosis not present

## 2020-11-11 DIAGNOSIS — R1310 Dysphagia, unspecified: Secondary | ICD-10-CM | POA: Diagnosis not present

## 2020-11-11 DIAGNOSIS — I150 Renovascular hypertension: Secondary | ICD-10-CM | POA: Diagnosis not present

## 2020-11-11 DIAGNOSIS — Z4789 Encounter for other orthopedic aftercare: Secondary | ICD-10-CM | POA: Diagnosis not present

## 2020-11-11 DIAGNOSIS — M9701XD Periprosthetic fracture around internal prosthetic right hip joint, subsequent encounter: Secondary | ICD-10-CM | POA: Diagnosis not present

## 2020-11-11 DIAGNOSIS — K219 Gastro-esophageal reflux disease without esophagitis: Secondary | ICD-10-CM | POA: Diagnosis not present

## 2020-11-11 DIAGNOSIS — Z7901 Long term (current) use of anticoagulants: Secondary | ICD-10-CM | POA: Diagnosis not present

## 2020-11-11 DIAGNOSIS — D631 Anemia in chronic kidney disease: Secondary | ICD-10-CM | POA: Diagnosis not present

## 2020-11-11 DIAGNOSIS — Z86010 Personal history of colonic polyps: Secondary | ICD-10-CM | POA: Diagnosis not present

## 2020-11-11 DIAGNOSIS — E039 Hypothyroidism, unspecified: Secondary | ICD-10-CM | POA: Diagnosis not present

## 2020-11-11 DIAGNOSIS — I129 Hypertensive chronic kidney disease with stage 1 through stage 4 chronic kidney disease, or unspecified chronic kidney disease: Secondary | ICD-10-CM | POA: Diagnosis not present

## 2020-11-11 DIAGNOSIS — E440 Moderate protein-calorie malnutrition: Secondary | ICD-10-CM | POA: Diagnosis not present

## 2020-11-11 DIAGNOSIS — G309 Alzheimer's disease, unspecified: Secondary | ICD-10-CM | POA: Diagnosis not present

## 2020-11-11 DIAGNOSIS — N183 Chronic kidney disease, stage 3 unspecified: Secondary | ICD-10-CM | POA: Diagnosis not present

## 2020-11-11 DIAGNOSIS — E785 Hyperlipidemia, unspecified: Secondary | ICD-10-CM | POA: Diagnosis not present

## 2020-11-11 NOTE — Telephone Encounter (Signed)
328 pm.  Incoming call from Natalia Leatherwood, NP requesting follow up call be made to daughter Kenney Houseman. 340 pm Phone call made to daughter.  No answer and message left. 412 pm. Spoke with daughter Kenney Houseman who states they are currently in Urgent Care.  She will follow up with NP with outcome.

## 2020-11-11 NOTE — Telephone Encounter (Signed)
Spoke to pt daughter and she stated that she wanted to get a virtual visit with pt and bring urine to [place] due to pt not being able to walk. I spoke with the hospice nurse and advised her that we could do this. Hospice nurse stated she will let the pt daughter know since she was in the office with her at the time. I verbalized understanding. Pt daughter however, stated that she was not advised of update. I advised her to schedule appt. And daughter stated that she will test the pt urine with strips that she has at home and if its neg then she will "move on" if its positive she will bring it in. I advised daughter of possible false neg and that she has to be careful with UTI. Pt daughter verbalized understanding and stated she will call back later to schedule.

## 2020-11-12 ENCOUNTER — Telehealth: Payer: Medicare Other | Admitting: Family Medicine

## 2020-11-16 ENCOUNTER — Telehealth: Payer: Self-pay | Admitting: Adult Health

## 2020-11-16 ENCOUNTER — Telehealth: Payer: Self-pay | Admitting: Nurse Practitioner

## 2020-11-16 DIAGNOSIS — F0280 Dementia in other diseases classified elsewhere without behavioral disturbance: Secondary | ICD-10-CM

## 2020-11-16 NOTE — Telephone Encounter (Signed)
I am ok with hospice assessment order. After chart review/palliative care note review it looks like daughter who is POA has been informed of options including ED for treatment of current infection and has chosen to avoid further hospital visits/intervention.

## 2020-11-16 NOTE — Telephone Encounter (Signed)
Julia [name] w/Palliative care is calling to see if they can get a order for hospice assessment pts daughter called in to wanting to get assistance with the pt at home and pt is having a high fever after antibiotics was changed on Saturday when the culture came back and pt is still running a high fever.  NP [facility] care.  They would like to have a call back.

## 2020-11-16 NOTE — Telephone Encounter (Signed)
I received email/text messages from Sasser, Ms. Righi daughter sharing she has continue to decline, fever, heart rate 120, o2 sats 93%. We talked about overall decline, debility, Ms. Henigan meeting criteria for hospice prior to this acute illness. Discussed with Tonya at length with +UTI that antibiotic was also changed on Sat due to culture results. Tonya called Hospice Triage line as Palliative does not have oncall was told by Triage nurse to take Ms. Boteler to the ED for symptoms. Kenney Houseman endorses she does not want for Ms. Bullen to go to the ED. She is requesting Hospice to keep comfortable at home. We talked about option going to ED for IV antibiotics/IVF or home with hospice. Kenney Houseman endorses repeatedly she wants to keep Ms. Munguia at home without further interventions, will continue oral antibiotic cipro started Sat. We talked about acute illness on chronic disease progression. Kenney Houseman was adamate keeping Ms. Nudo at home wishing for Hospice. I attempted to contact Dr Carlisle Cater office for Hospice order, notified referral intake  Total time 30 minutes Documentation 5 minutes Phone discussion 25 minutes

## 2020-11-16 NOTE — Telephone Encounter (Signed)
Verbal spoke with another provider for advise. Provider will look over notes and advise if appropriate.

## 2020-11-16 NOTE — Telephone Encounter (Signed)
Spoke to [place] and she stated that she spoke with Tonya(POA) and she is wanting Hospice for pt. Will place order as ok'd by Dr.Kobelein and fax it to [phone].

## 2020-11-16 NOTE — Telephone Encounter (Signed)
Received the following email from daughter Mindi Junker after discussing things with my support system I have decided to hold off on switching over to hospice at this time. My husband and I are going to try to get away for a few days to process and recharge. IF any major changes occur I would return immediately and will reach out to you.   We are definitely seeing her energy level decrease. She is sleeping more. Very weak and shaky. Her heat rate has been elevated for a few days 117/120 range. Eating smaller amounts. Drinking about the same. Still small amounts at a time.  Not motivated to get up and move much.  Will sit up and listen to country music on the TV and watch Lujean Rave.   Thank you for your support.  I will be in touch.  Jake Samples [phone]

## 2020-11-18 ENCOUNTER — Encounter: Payer: Self-pay | Admitting: Adult Health

## 2020-11-18 NOTE — Addendum Note (Signed)
Addended by: Gwenyth Ober R on: 11/18/2020 04:02 PM   Modules accepted: Orders

## 2020-11-18 NOTE — Telephone Encounter (Signed)
Referral faxed

## 2020-11-18 NOTE — Telephone Encounter (Signed)
Hospice order placed. Trying to find where it printed.

## 2020-11-23 ENCOUNTER — Telehealth: Payer: Self-pay

## 2020-11-26 ENCOUNTER — Telehealth: Payer: Self-pay | Admitting: Adult Health

## 2020-11-26 ENCOUNTER — Telehealth: Payer: Medicare Other | Admitting: Adult Health

## 2020-11-26 NOTE — Telephone Encounter (Signed)
Encounter from virtual appt. Called pt daughter to go over virtual appt. However, was advised that pt had expired. No further actions needed. Provider aware of update.

## 2020-11-26 NOTE — Telephone Encounter (Signed)
Spoke patient's daughter, Kenney Houseman inform me that Cherril passed away this morning around 6:30 AM under hospice care.  My condolences were given

## 2020-11-27 DEATH — deceased

## 2022-10-14 IMAGING — CR DG FEMUR 2+V*R*
5 series · 5 of 5 positions shown · non-contrast
Comparison: Right hip radiograph 03/26/2017, fluoroscopic images
03/27/2017

CLINICAL DATA: pain, fall

EXAM:
RIGHT FEMUR 2 VIEWS

[femur ap (1 of 2)]
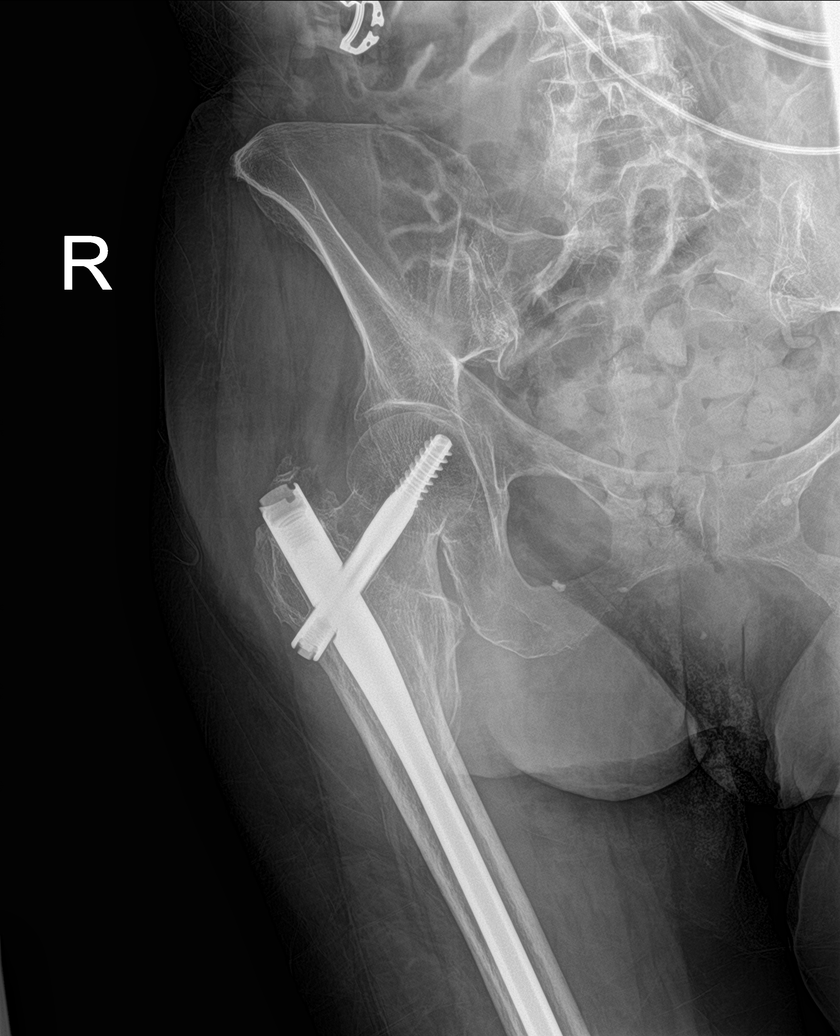

[femur ap (2 of 2)]
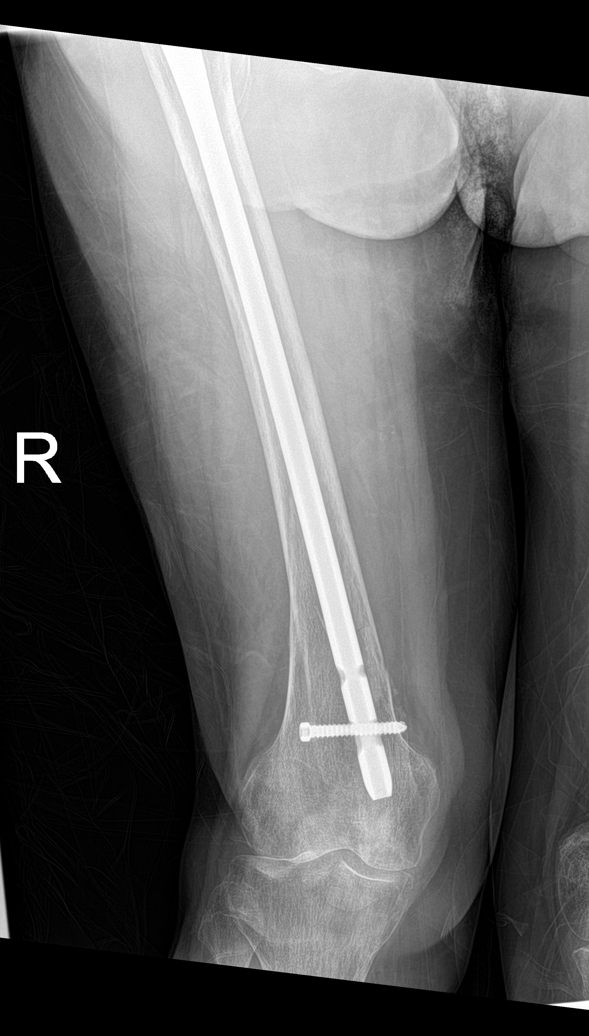

[femur lat (1 of 3)]
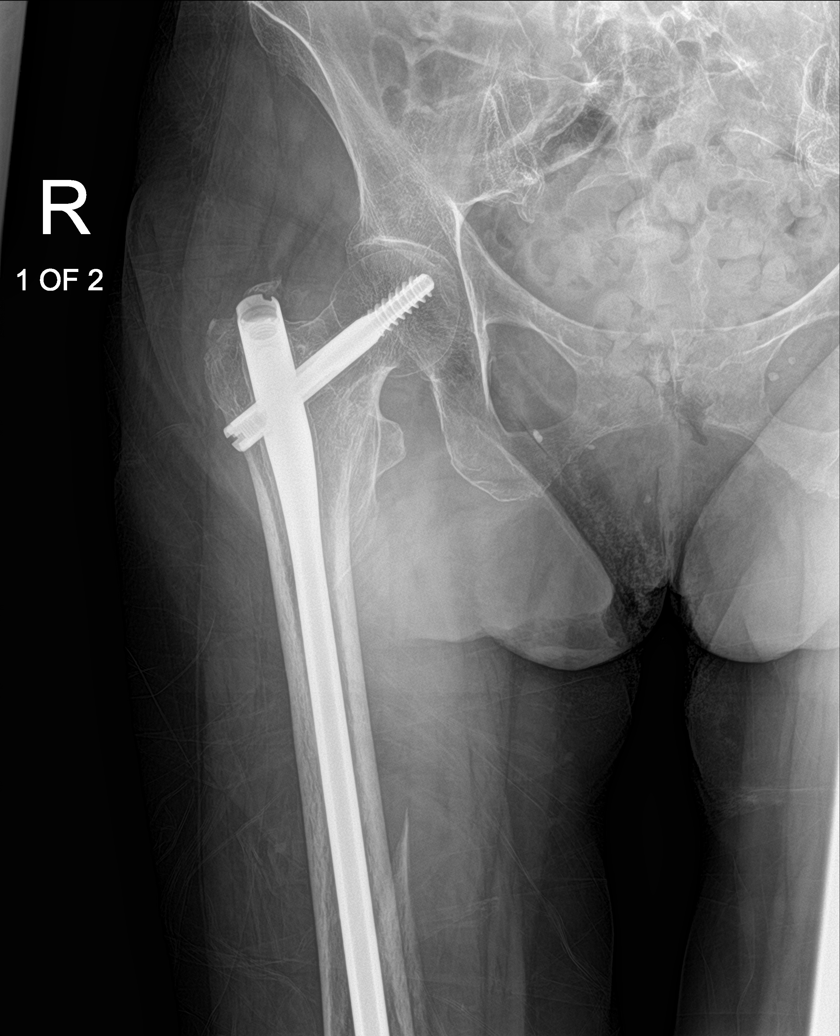

[femur lat (2 of 3)]
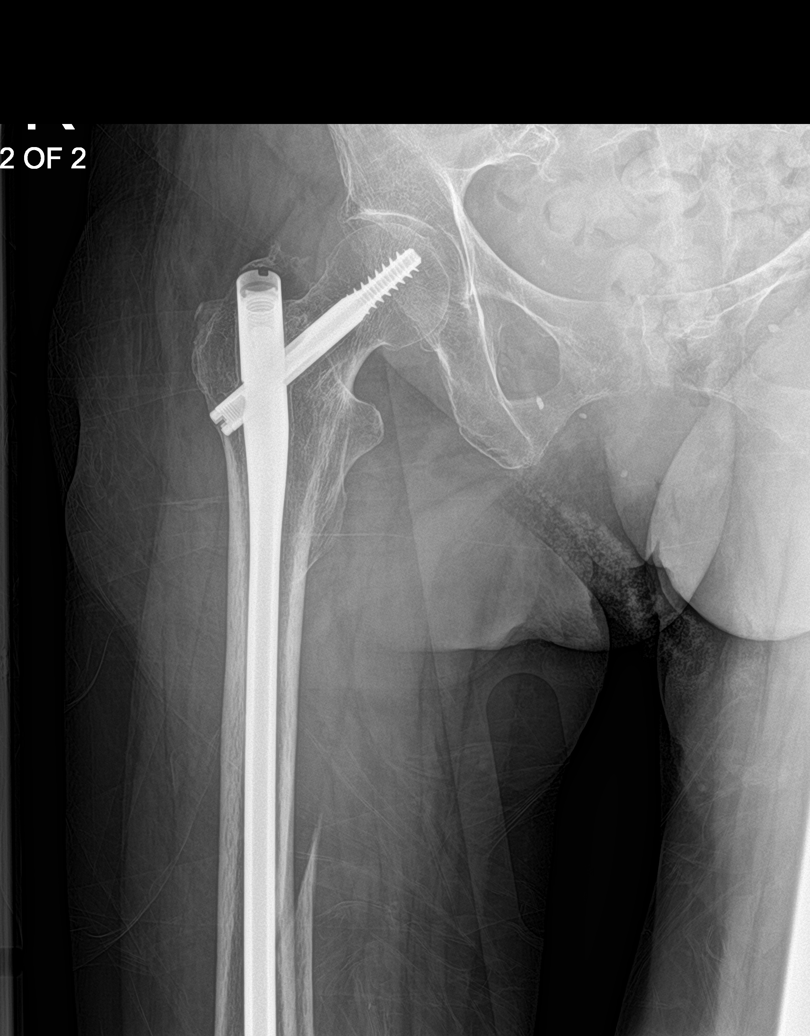

[femur lat (3 of 3)]
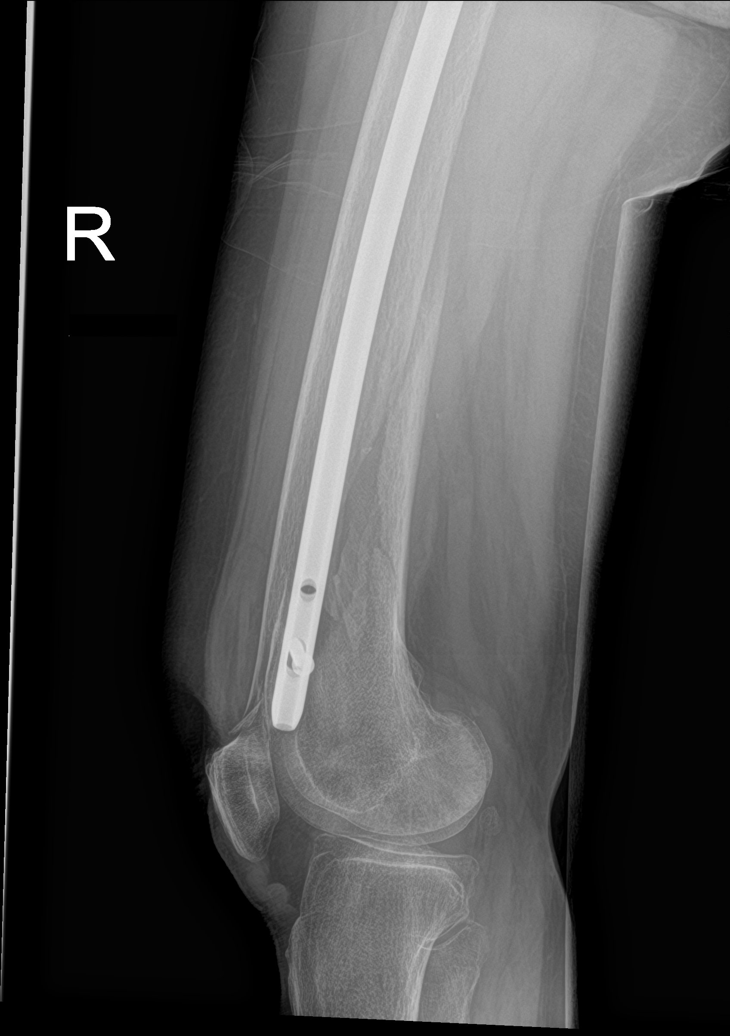

[5 of 5 positions shown; findings below may reference images not displayed]

FINDINGS: There is a posteriorly displaced periprosthetic mid to distal femur
fracture, with likely intra-articular involvement of the knee joint.
Intramedullary fixation hardware is intact but displaced due to the
fracture. Old healed proximal femur fracture. Adjacent soft tissue
swelling. Laxity of the patellar tendon in part due to knee
hyperextension.
IMPRESSION: Posteriorly displaced periprosthetic fracture of the mid to distal
femur, with likely intra-articular extension to the knee joint.

## 2022-10-14 IMAGING — CT CT HEAD W/O CM
4 series · 16 of 47 positions shown, 18 images · non-contrast
Comparison: February 19, 2018

CLINICAL DATA: Multiple falls

EXAM:
CT HEAD WITHOUT CONTRAST
TECHNIQUE: Contiguous axial images were obtained from the base of the skull
through the vertex without intravenous contrast.

[Series 3: head bone · axial · 0.42mm/px · z∈[-36,-4]mm · 3 of 78 slices shown]
[im 8/78  bone]
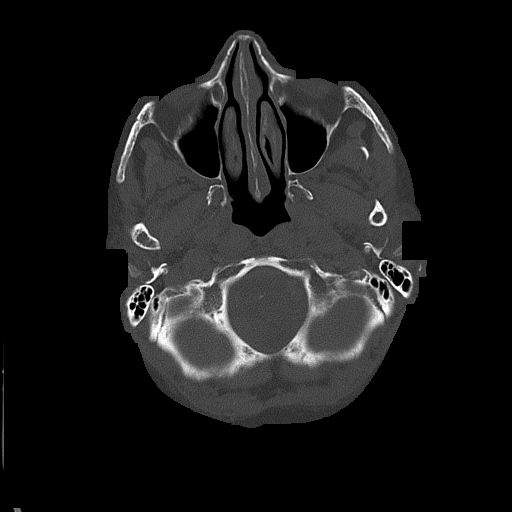
[im 16/78  bone]
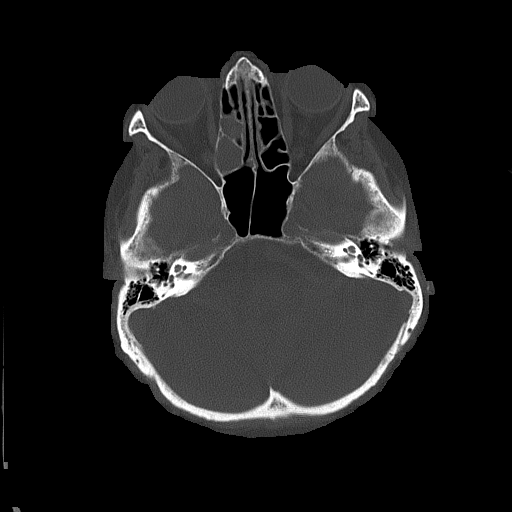
[im 24/78  bone]
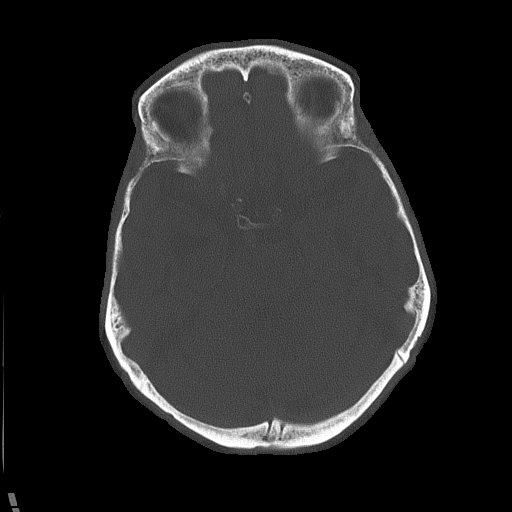

[Series 4: head without · axial · non-contrast · 0.42mm/px · z∈[-35,+80]mm · 7 of 31 slices shown, 9 images]
[im 4/31  brain]
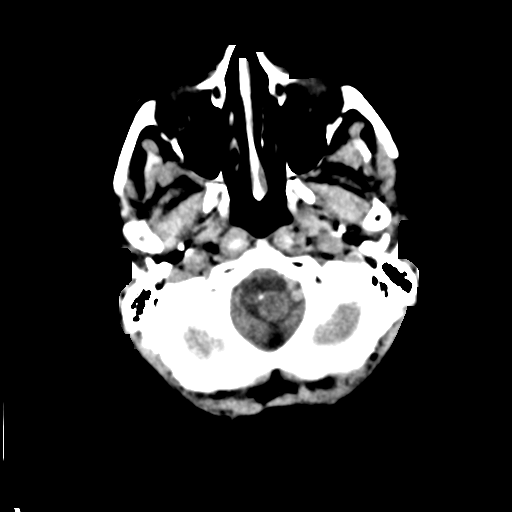
[im 4/31  bone]
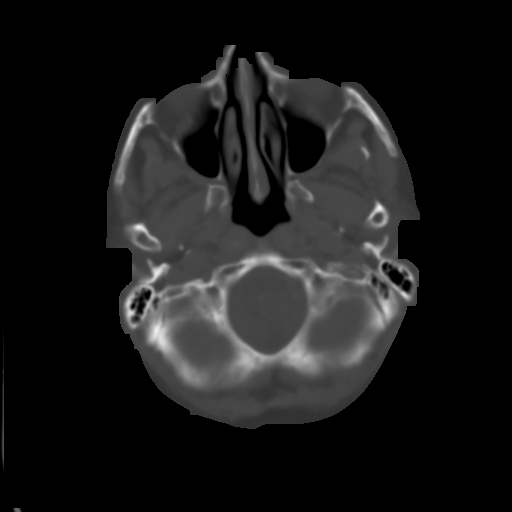
[im 8/31  brain]
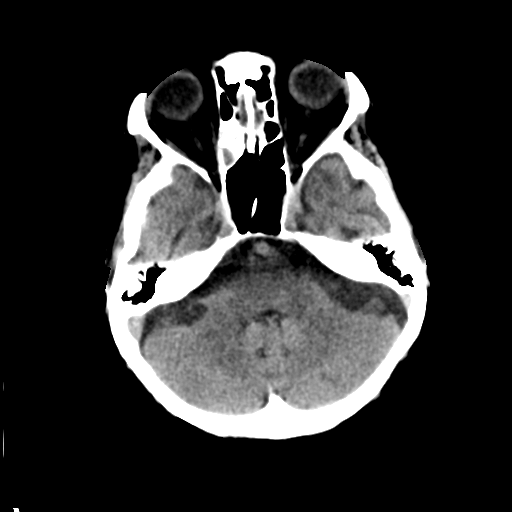
[im 12/31  brain]
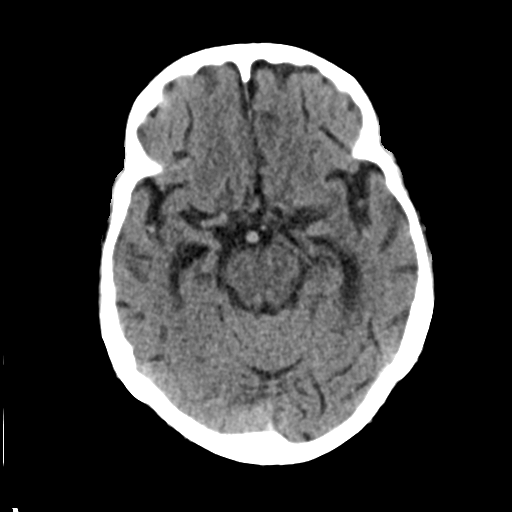
[im 16/31  brain]
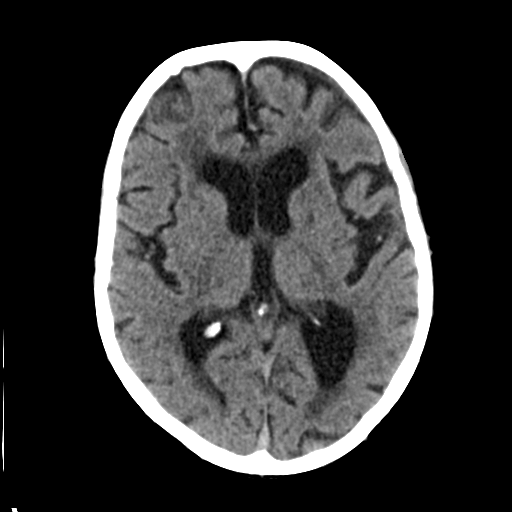
[im 19/31  brain]
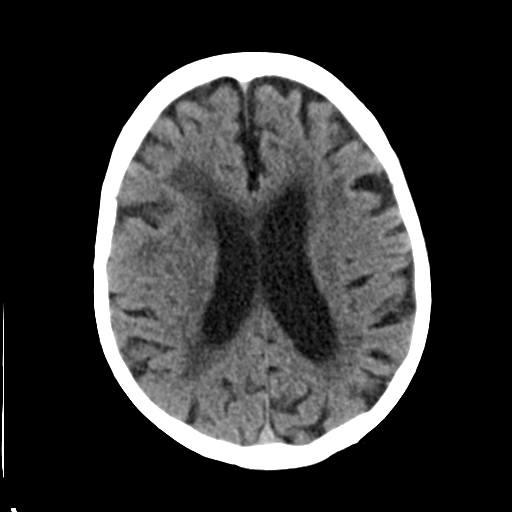
[im 19/31  bone]
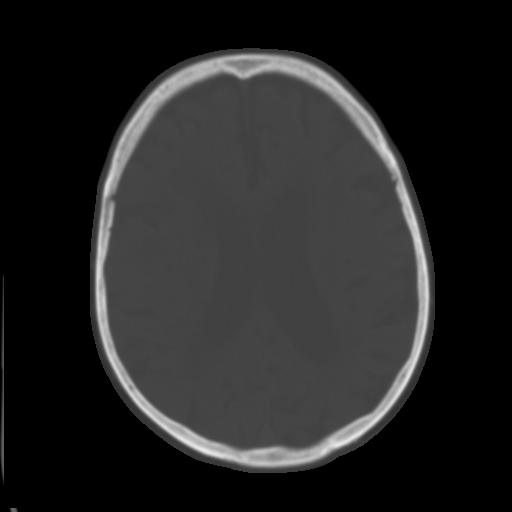
[im 23/31  brain]
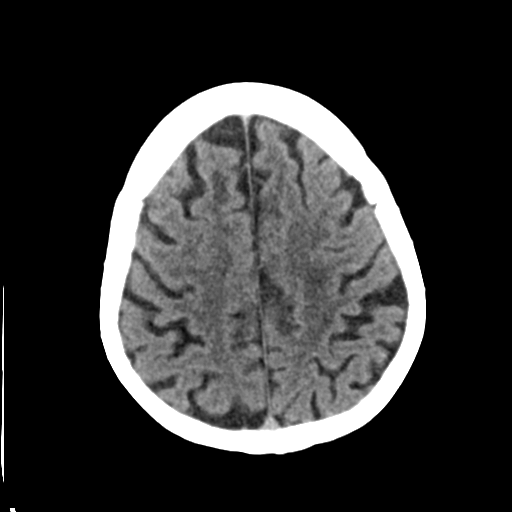
[im 27/31  brain]
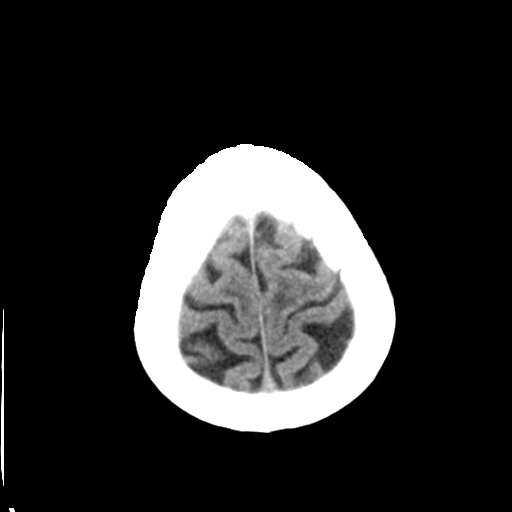

[Series 5: head without sag · sagittal · non-contrast · 0.31mm/px · 3 of 56 slices shown]
[im 19/56  brain]
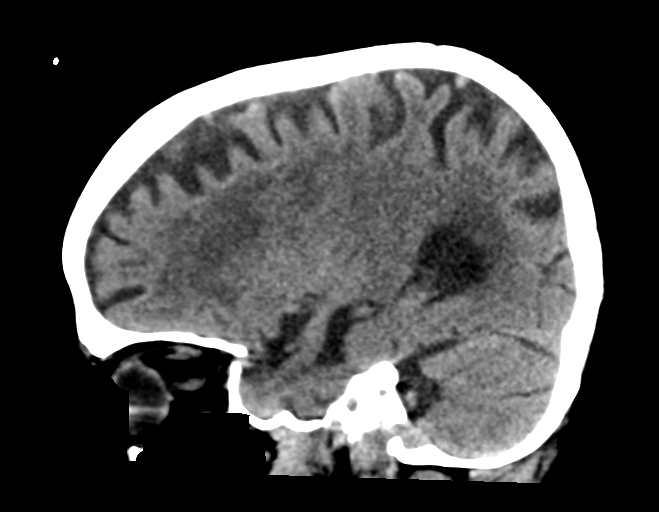
[im 28/56  brain]
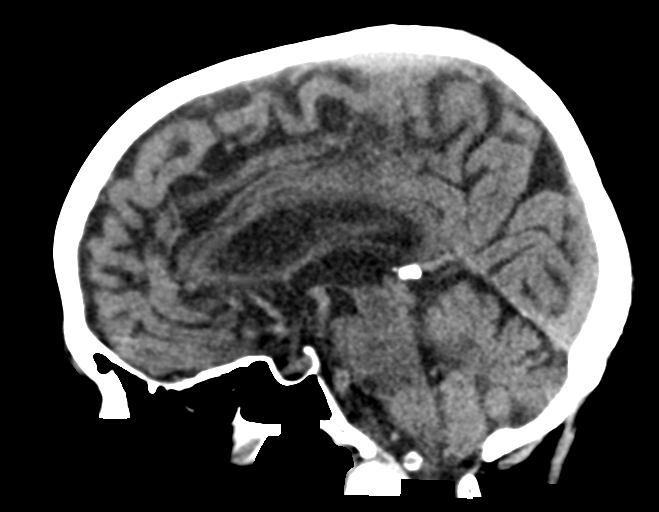
[im 37/56  brain]
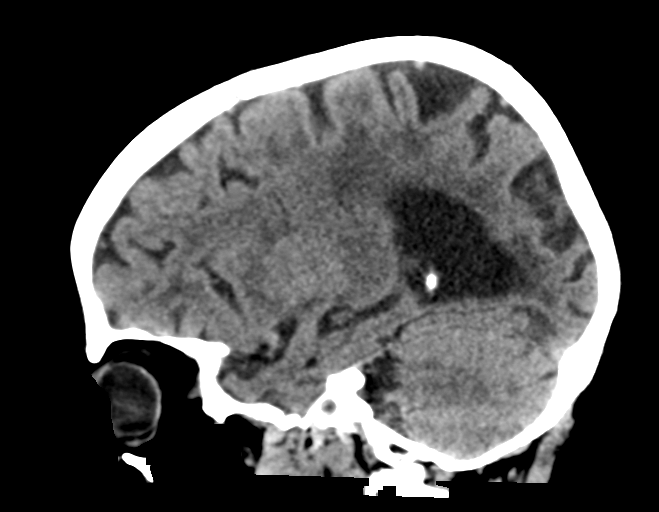

[Series 6: head without cor · coronal · non-contrast · 0.31mm/px · 3 of 68 slices shown]
[im 23/68  brain]
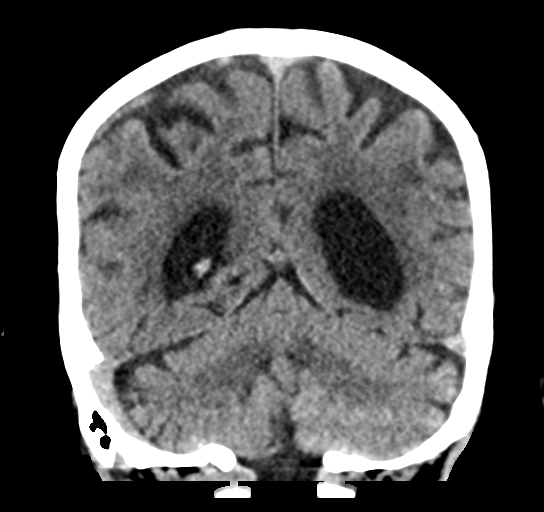
[im 30/68  brain]
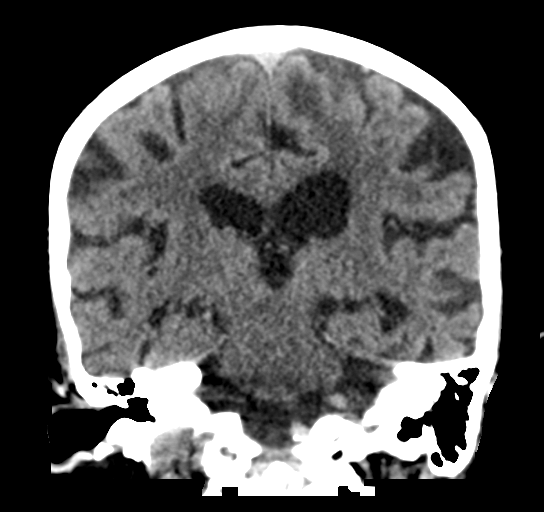
[im 38/68  brain]
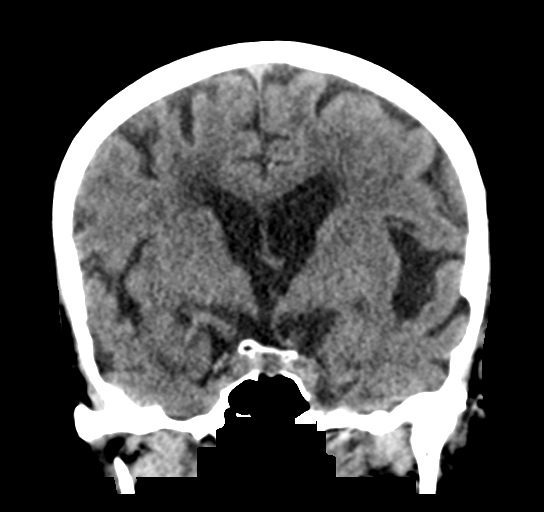

[16 of 47 positions shown; findings below may reference images not displayed]

FINDINGS: Brain: No evidence of acute large vascular territory infarction,
hemorrhage, hydrocephalus, extra-axial collection or mass
lesion/mass effect. Stable moderate to severe global parenchymal
volume loss with ex vacuo dilatation of ventricular system. Similar
moderate to severe burden of chronic small vessel ischemic disease.

Vascular: Dolichoectatic intracranial vessels stable from prior
imaging seen with chronic hypertension. Scattered atherosclerotic
calcifications of the internal carotid and vertebral arteries at the
skull base.

Skull: Normal. Negative for fracture or focal lesion.

Sinuses/Orbits: Opacified right ethmoid air cells, otherwise the
paranasal sinuses and mastoid air cells are predominantly clear.

Other: None
IMPRESSION: 1. No acute intracranial findings.
2. Stable moderate to severe global parenchymal volume loss and
similar chronic small vessel ischemic disease.

## 2022-10-15 IMAGING — RF DG C-ARM 1-60 MIN
1 series · 7 of 7 positions shown · non-contrast
Comparison: September 22, 2020

CLINICAL DATA: ORIF right femur.

EXAM:
DG C-ARM 1-60 MIN; RIGHT FEMUR 2 VIEWS
FLUOROSCOPY TIME:  Fluoroscopy Time:  59 seconds.
Radiation Exposure Index (if provided by the fluoroscopic device):
4.56 mGy.
Number of Acquired Spot Images: 7

[Series 1: run · 7 of 7 slices shown]
[im 1/7]
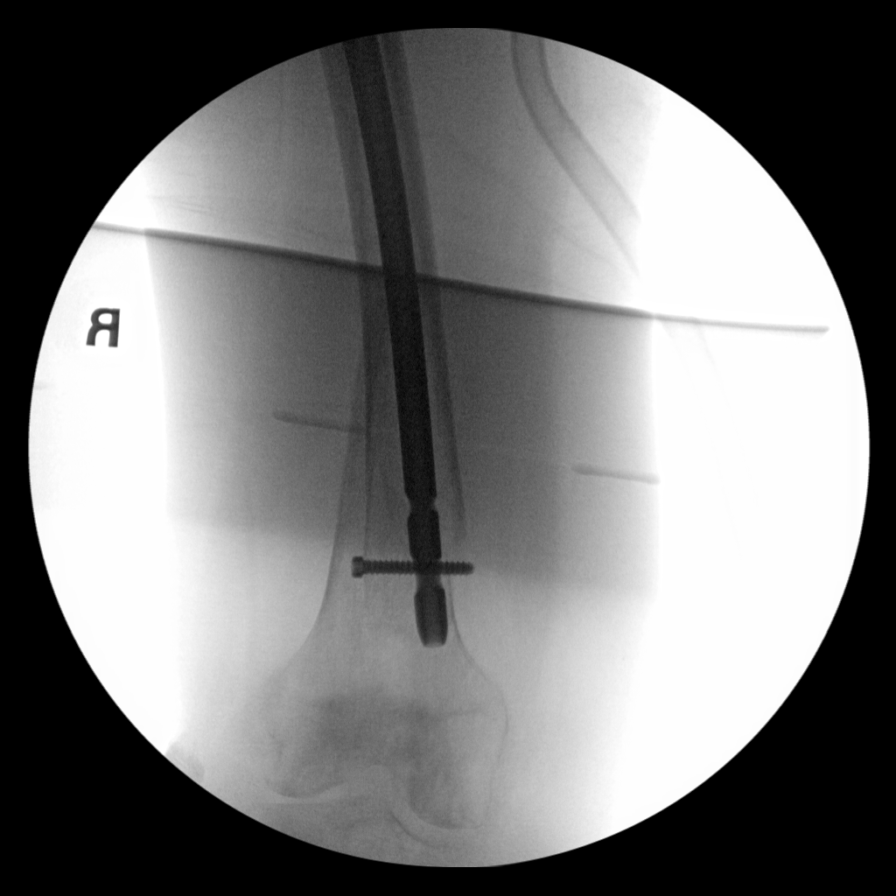
[im 2/7]
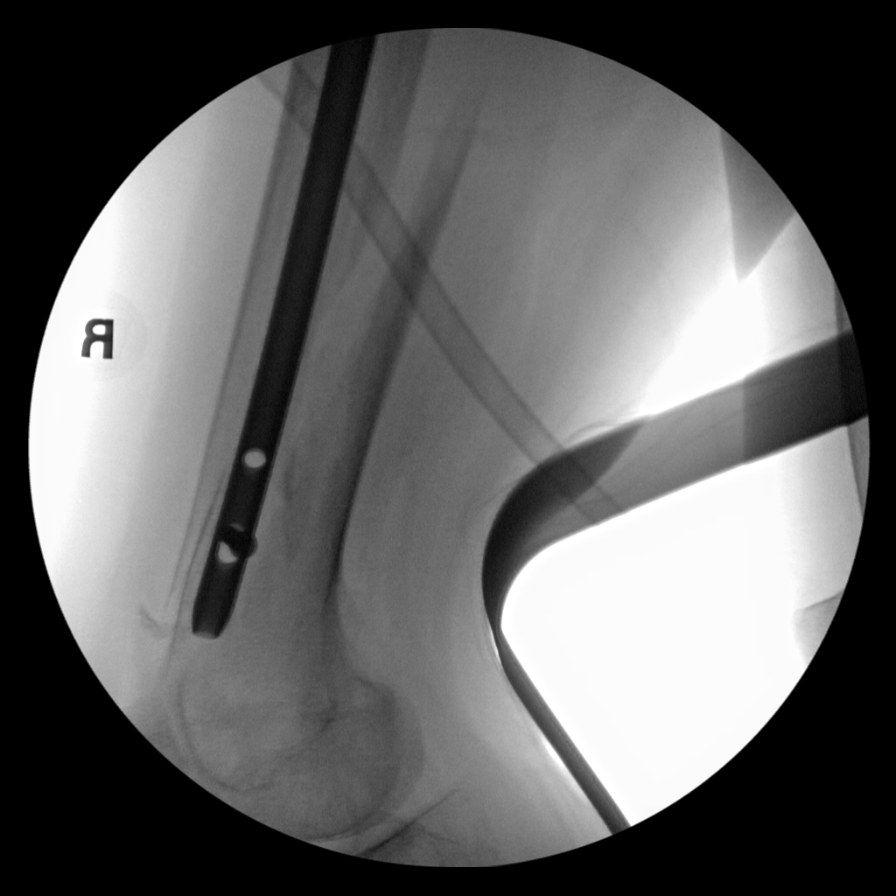
[im 3/7]
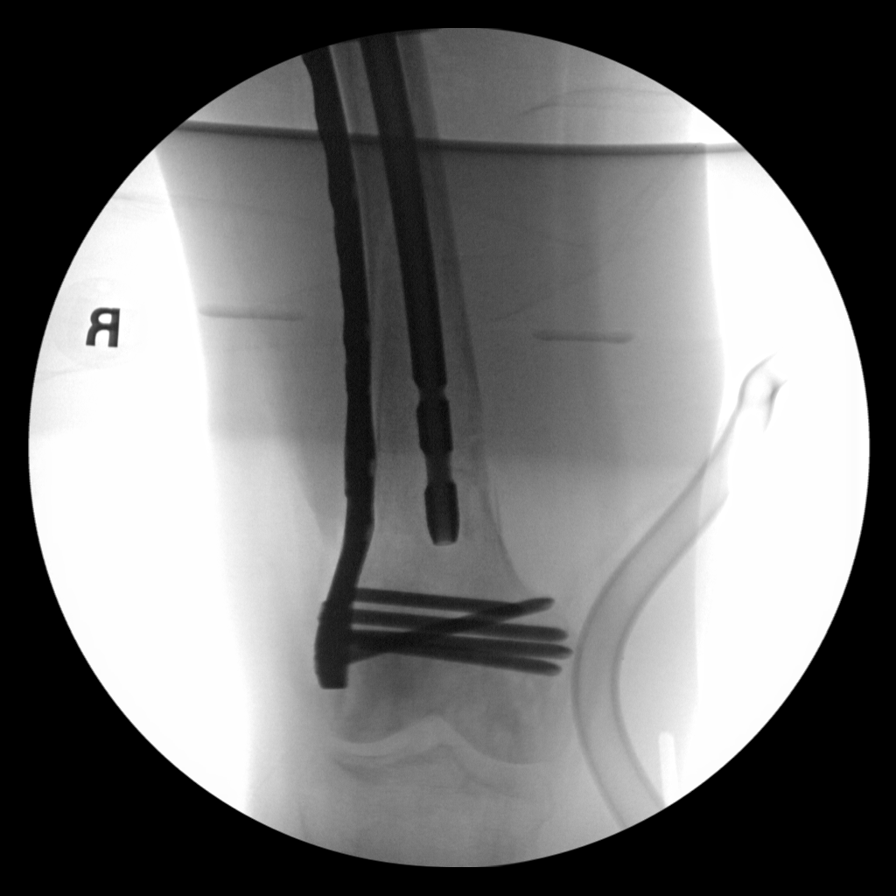
[im 4/7]
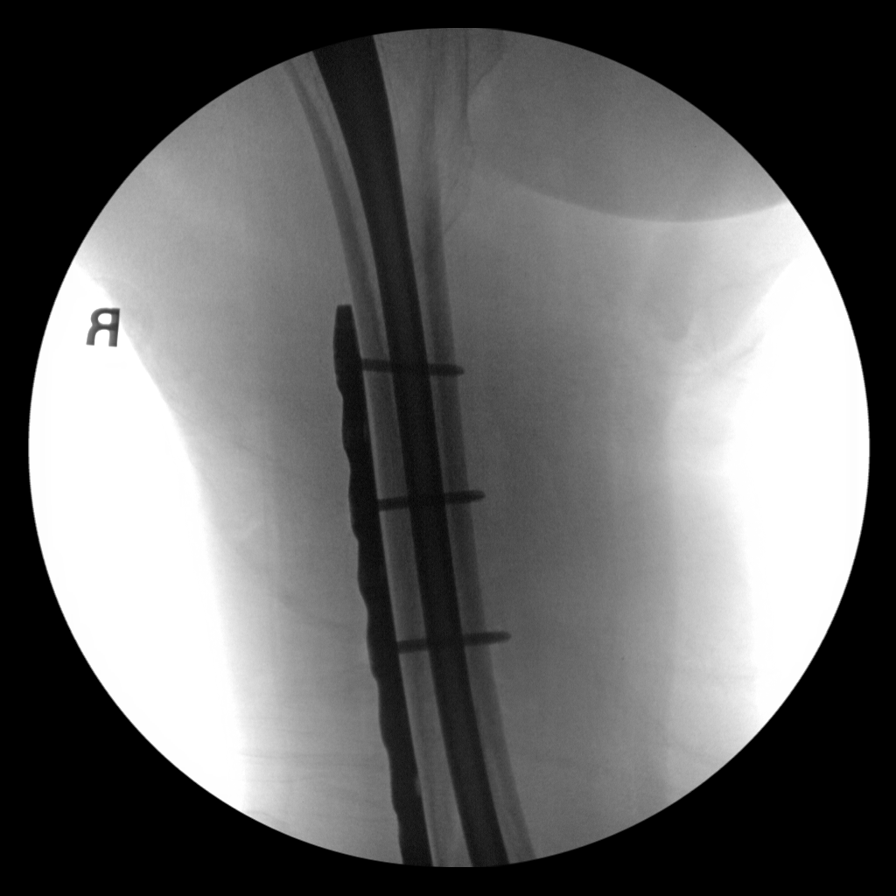
[im 5/7]
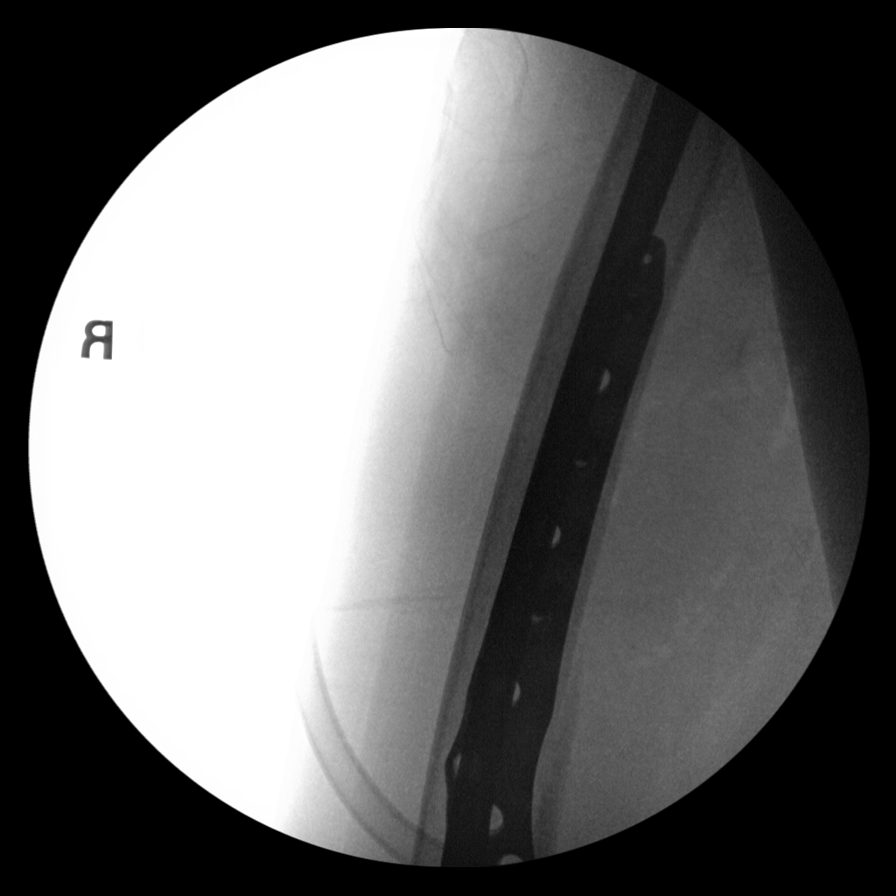
[im 6/7]
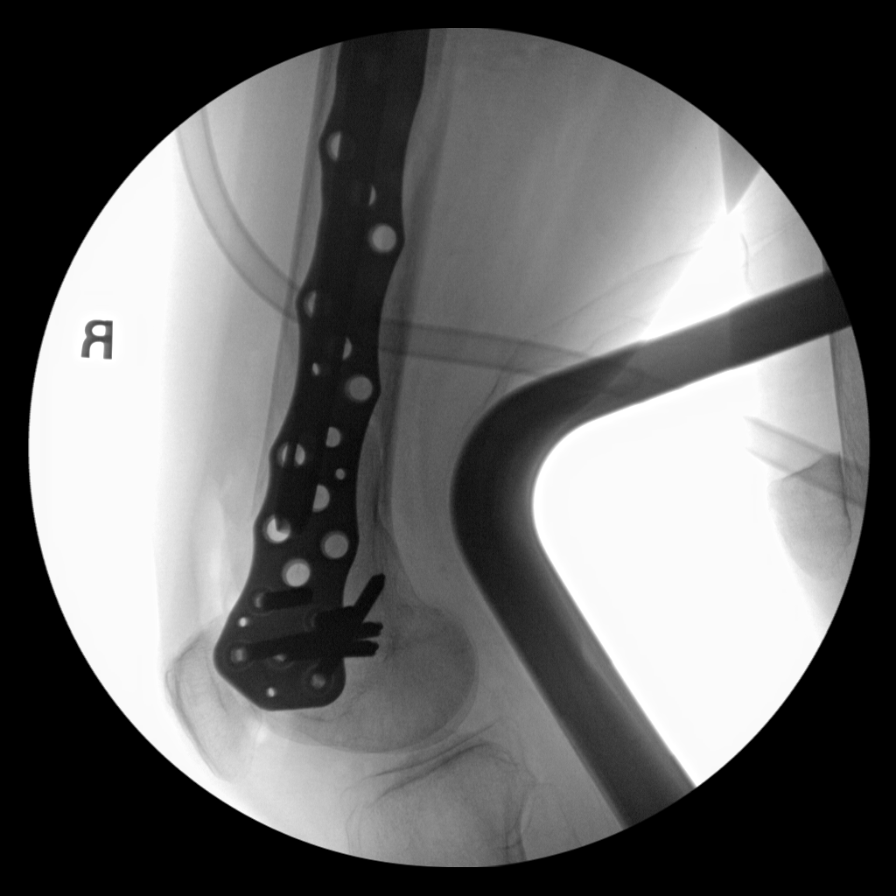
[im 7/7]
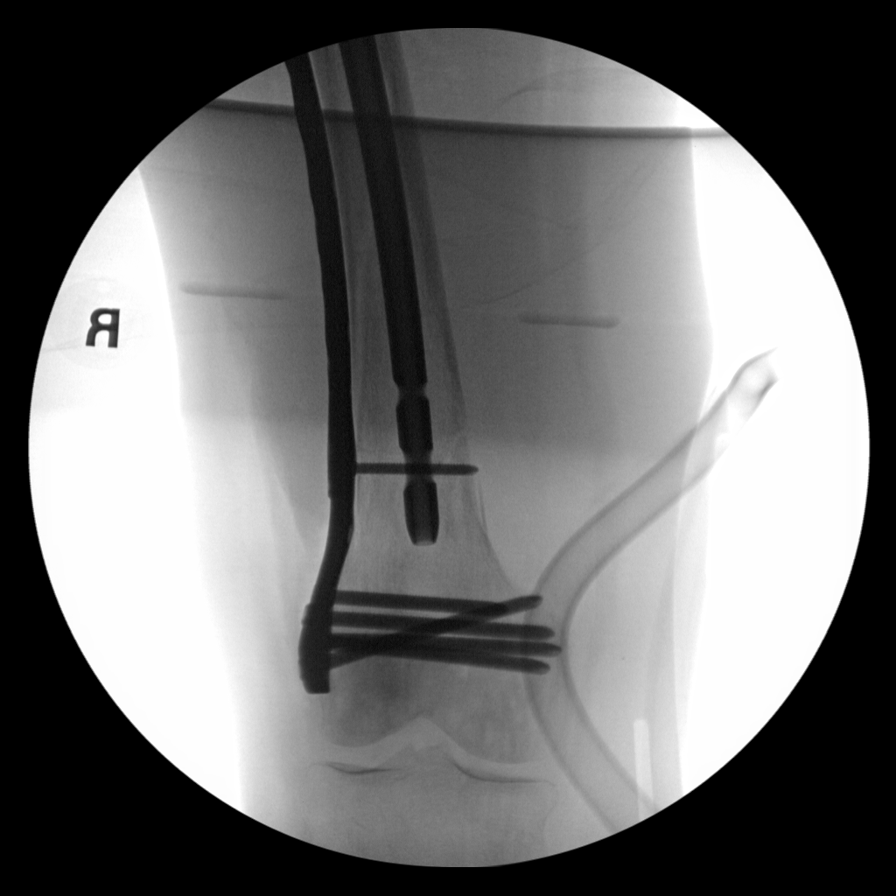

[7 of 7 positions shown; findings below may reference images not displayed]

FINDINGS: Seven C-arm fluoroscopic images were obtained intraoperatively and
submitted for post operative interpretation. These images
demonstrate plate and screw fixation of the femur. Final images
demonstrate improved alignment the periprosthetic distal femur
fracture. Partially imaged prior intramedullary nail fixation
hardware. Please see the performing provider's procedural report for
further detail.
IMPRESSION: Intraoperative fluoroscopy, as detailed above.
